# Patient Record
Sex: Female | Born: 1935 | Race: White | Hispanic: No | Smoking: Former smoker
Health system: Southern US, Community
[De-identification: ages and names within clinical notes are randomized; demographics above are authoritative.]

## PROBLEM LIST (undated history)

## (undated) DIAGNOSIS — J449 Chronic obstructive pulmonary disease, unspecified: Secondary | ICD-10-CM

## (undated) DIAGNOSIS — Z950 Presence of cardiac pacemaker: Secondary | ICD-10-CM

## (undated) DIAGNOSIS — F0280 Dementia in other diseases classified elsewhere without behavioral disturbance: Secondary | ICD-10-CM

## (undated) DIAGNOSIS — F419 Anxiety disorder, unspecified: Secondary | ICD-10-CM

## (undated) DIAGNOSIS — G309 Alzheimer's disease, unspecified: Secondary | ICD-10-CM

## (undated) DIAGNOSIS — F32A Depression, unspecified: Secondary | ICD-10-CM

## (undated) DIAGNOSIS — R55 Syncope and collapse: Secondary | ICD-10-CM

## (undated) DIAGNOSIS — M1990 Unspecified osteoarthritis, unspecified site: Secondary | ICD-10-CM

## (undated) DIAGNOSIS — F329 Major depressive disorder, single episode, unspecified: Secondary | ICD-10-CM

## (undated) DIAGNOSIS — I447 Left bundle-branch block, unspecified: Secondary | ICD-10-CM

## (undated) DIAGNOSIS — I10 Essential (primary) hypertension: Secondary | ICD-10-CM

## (undated) HISTORY — DX: Syncope and collapse: R55

## (undated) HISTORY — DX: Left bundle-branch block, unspecified: I44.7

## (undated) HISTORY — PX: SHOULDER SURGERY: SHX246

---

## 1968-07-19 HISTORY — PX: AUGMENTATION MAMMAPLASTY: SUR837

## 1998-01-09 ENCOUNTER — Ambulatory Visit (HOSPITAL_COMMUNITY): Admission: RE | Admit: 1998-01-09 | Discharge: 1998-01-09 | Payer: Self-pay | Admitting: Plastic Surgery

## 1999-08-20 ENCOUNTER — Emergency Department (HOSPITAL_COMMUNITY): Admission: EM | Admit: 1999-08-20 | Discharge: 1999-08-20 | Payer: Self-pay | Admitting: Emergency Medicine

## 1999-08-20 ENCOUNTER — Encounter: Payer: Self-pay | Admitting: Emergency Medicine

## 1999-08-23 ENCOUNTER — Inpatient Hospital Stay (HOSPITAL_COMMUNITY): Admission: EM | Admit: 1999-08-23 | Discharge: 1999-08-25 | Payer: Self-pay | Admitting: Emergency Medicine

## 1999-08-23 ENCOUNTER — Encounter: Payer: Self-pay | Admitting: Emergency Medicine

## 1999-08-31 ENCOUNTER — Inpatient Hospital Stay (HOSPITAL_COMMUNITY): Admission: RE | Admit: 1999-08-31 | Discharge: 1999-09-03 | Payer: Self-pay | Admitting: Orthopedic Surgery

## 1999-08-31 ENCOUNTER — Encounter: Payer: Self-pay | Admitting: Orthopedic Surgery

## 2000-07-29 ENCOUNTER — Other Ambulatory Visit: Admission: RE | Admit: 2000-07-29 | Discharge: 2000-07-29 | Payer: Self-pay | Admitting: Family Medicine

## 2000-08-03 ENCOUNTER — Encounter: Admission: RE | Admit: 2000-08-03 | Discharge: 2000-08-03 | Payer: Self-pay | Admitting: Family Medicine

## 2000-08-03 ENCOUNTER — Encounter: Payer: Self-pay | Admitting: Family Medicine

## 2001-10-03 ENCOUNTER — Ambulatory Visit (HOSPITAL_COMMUNITY): Admission: RE | Admit: 2001-10-03 | Discharge: 2001-10-03 | Payer: Self-pay | Admitting: Family Medicine

## 2001-10-03 ENCOUNTER — Encounter: Payer: Self-pay | Admitting: Family Medicine

## 2001-10-13 ENCOUNTER — Ambulatory Visit (HOSPITAL_COMMUNITY): Admission: RE | Admit: 2001-10-13 | Discharge: 2001-10-13 | Payer: Self-pay | Admitting: Family Medicine

## 2001-10-13 ENCOUNTER — Encounter (INDEPENDENT_AMBULATORY_CARE_PROVIDER_SITE_OTHER): Payer: Self-pay | Admitting: Specialist

## 2001-10-13 ENCOUNTER — Encounter: Payer: Self-pay | Admitting: Family Medicine

## 2001-12-03 ENCOUNTER — Encounter: Payer: Self-pay | Admitting: Emergency Medicine

## 2001-12-03 ENCOUNTER — Emergency Department (HOSPITAL_COMMUNITY): Admission: EM | Admit: 2001-12-03 | Discharge: 2001-12-03 | Payer: Self-pay | Admitting: Emergency Medicine

## 2002-03-20 ENCOUNTER — Emergency Department (HOSPITAL_COMMUNITY): Admission: EM | Admit: 2002-03-20 | Discharge: 2002-03-21 | Payer: Self-pay | Admitting: Emergency Medicine

## 2002-03-21 ENCOUNTER — Encounter: Payer: Self-pay | Admitting: Emergency Medicine

## 2002-07-05 ENCOUNTER — Ambulatory Visit (HOSPITAL_COMMUNITY): Admission: RE | Admit: 2002-07-05 | Discharge: 2002-07-05 | Payer: Self-pay | Admitting: Neurology

## 2002-09-24 ENCOUNTER — Ambulatory Visit (HOSPITAL_COMMUNITY): Admission: RE | Admit: 2002-09-24 | Discharge: 2002-09-24 | Payer: Self-pay | Admitting: Neurology

## 2002-10-03 ENCOUNTER — Encounter: Payer: Self-pay | Admitting: General Surgery

## 2002-10-08 ENCOUNTER — Observation Stay (HOSPITAL_COMMUNITY): Admission: RE | Admit: 2002-10-08 | Discharge: 2002-10-09 | Payer: Self-pay | Admitting: General Surgery

## 2002-12-18 ENCOUNTER — Other Ambulatory Visit: Admission: RE | Admit: 2002-12-18 | Discharge: 2002-12-18 | Payer: Self-pay | Admitting: Family Medicine

## 2003-05-09 ENCOUNTER — Emergency Department (HOSPITAL_COMMUNITY): Admission: EM | Admit: 2003-05-09 | Discharge: 2003-05-09 | Payer: Self-pay | Admitting: Emergency Medicine

## 2003-05-29 ENCOUNTER — Encounter: Admission: RE | Admit: 2003-05-29 | Discharge: 2003-05-29 | Payer: Self-pay | Admitting: Obstetrics & Gynecology

## 2003-06-06 ENCOUNTER — Encounter: Admission: RE | Admit: 2003-06-06 | Discharge: 2003-06-06 | Payer: Self-pay | Admitting: Pathology

## 2003-06-26 ENCOUNTER — Encounter: Admission: RE | Admit: 2003-06-26 | Discharge: 2003-06-26 | Payer: Self-pay | Admitting: Obstetrics & Gynecology

## 2003-06-29 ENCOUNTER — Emergency Department (HOSPITAL_COMMUNITY): Admission: AD | Admit: 2003-06-29 | Discharge: 2003-06-29 | Payer: Self-pay | Admitting: Family Medicine

## 2003-07-17 ENCOUNTER — Encounter: Admission: RE | Admit: 2003-07-17 | Discharge: 2003-07-17 | Payer: Self-pay | Admitting: Endocrinology

## 2003-08-06 ENCOUNTER — Ambulatory Visit (HOSPITAL_COMMUNITY): Admission: RE | Admit: 2003-08-06 | Discharge: 2003-08-06 | Payer: Self-pay | Admitting: *Deleted

## 2003-08-15 ENCOUNTER — Observation Stay (HOSPITAL_COMMUNITY): Admission: RE | Admit: 2003-08-15 | Discharge: 2003-08-16 | Payer: Self-pay | Admitting: Urology

## 2004-04-09 ENCOUNTER — Ambulatory Visit (HOSPITAL_COMMUNITY): Admission: RE | Admit: 2004-04-09 | Discharge: 2004-04-09 | Payer: Self-pay | Admitting: Urology

## 2004-06-03 ENCOUNTER — Ambulatory Visit: Payer: Self-pay | Admitting: Endocrinology

## 2004-06-18 ENCOUNTER — Ambulatory Visit (HOSPITAL_COMMUNITY): Admission: RE | Admit: 2004-06-18 | Discharge: 2004-06-18 | Payer: Self-pay | Admitting: Urology

## 2005-07-27 ENCOUNTER — Ambulatory Visit (HOSPITAL_COMMUNITY): Admission: RE | Admit: 2005-07-27 | Discharge: 2005-07-27 | Payer: Self-pay | Admitting: Pathology

## 2006-10-26 ENCOUNTER — Inpatient Hospital Stay (HOSPITAL_COMMUNITY): Admission: AD | Admit: 2006-10-26 | Discharge: 2006-10-26 | Payer: Self-pay | Admitting: Gynecology

## 2006-11-17 ENCOUNTER — Ambulatory Visit: Payer: Self-pay | Admitting: Vascular Surgery

## 2006-12-08 ENCOUNTER — Inpatient Hospital Stay (HOSPITAL_COMMUNITY): Admission: AD | Admit: 2006-12-08 | Discharge: 2006-12-08 | Payer: Self-pay | Admitting: Obstetrics and Gynecology

## 2006-12-08 ENCOUNTER — Encounter: Admission: RE | Admit: 2006-12-08 | Discharge: 2006-12-08 | Payer: Self-pay | Admitting: Pathology

## 2006-12-20 ENCOUNTER — Ambulatory Visit (HOSPITAL_COMMUNITY): Admission: RE | Admit: 2006-12-20 | Discharge: 2006-12-20 | Payer: Self-pay | Admitting: *Deleted

## 2007-08-11 ENCOUNTER — Encounter: Admission: RE | Admit: 2007-08-11 | Discharge: 2007-08-11 | Payer: Self-pay | Admitting: Internal Medicine

## 2007-08-21 ENCOUNTER — Encounter: Admission: RE | Admit: 2007-08-21 | Discharge: 2007-08-21 | Payer: Self-pay | Admitting: Internal Medicine

## 2007-09-04 ENCOUNTER — Encounter: Admission: RE | Admit: 2007-09-04 | Discharge: 2007-09-04 | Payer: Self-pay | Admitting: Radiology

## 2007-09-05 ENCOUNTER — Encounter: Admission: RE | Admit: 2007-09-05 | Discharge: 2007-09-05 | Payer: Self-pay | Admitting: Radiology

## 2007-09-25 ENCOUNTER — Encounter: Admission: RE | Admit: 2007-09-25 | Discharge: 2007-09-25 | Payer: Self-pay | Admitting: Radiology

## 2007-12-28 ENCOUNTER — Encounter: Admission: RE | Admit: 2007-12-28 | Discharge: 2007-12-28 | Payer: Self-pay

## 2008-07-02 ENCOUNTER — Encounter: Admission: RE | Admit: 2008-07-02 | Discharge: 2008-07-02 | Payer: Self-pay | Admitting: *Deleted

## 2010-08-09 ENCOUNTER — Encounter: Payer: Self-pay | Admitting: Specialist

## 2010-08-09 ENCOUNTER — Encounter: Payer: Self-pay | Admitting: Radiology

## 2010-12-04 NOTE — Op Note (Signed)
NAMEHAIDYN, KILBURG                       ACCOUNT NO.:  [id number]   MEDICAL RECORD NO.:  [id number]                   PATIENT TYPE:  OBV   LOCATION:  0447                                 FACILITY:  [facility]   PHYSICIAN:  Gita Kudo, M.D.              DATE OF BIRTH:  23-Sep-1923   DATE OF PROCEDURE:  10/08/2002  DATE OF DISCHARGE:                                 OPERATIVE REPORT   PROCEDURE:  Repair right inguinal hernia-direct and indirect, with Prolene  mesh preperitoneal and onlay.   SURGEON:  Gita Kudo, M.D.   ANESTHESIA:  General.   PREOPERATIVE DIAGNOSES:  Right inguinal hernia.   POSTOPERATIVE DIAGNOSES:  Right inguinal hernia-direct and indirect.   CLINICAL SUMMARY:  A 77 year old female had left inguinal hernia repaired  years ago and now has a bulge in her right groin consistent with a right  inguinal hernia.   FINDINGS:  The patient had a lax abdominal wall with a definite right  inguinal hernia both direct and indirect. The contents were empted at time  of surgery.   DESCRIPTION OF PROCEDURE:  Under satisfactory general anesthesia, having  received 1.0 gm Ancef preop, the patient's abdomen prepped and draped in a  standard fashion. 30 mL of 0.5% Marcaine was infiltrated during the  procedure. A transverse incision made and carried down to and through the  external ring and external oblique. Bleeders coagulated or tied with 3-0  Vicryl and good exposure obtained with self retaining retractors. The hernia  sac was identified, dissected high, twisted and controlled with #0 Prolene  suture and excess excised. The floor of the canal was then opened from the  pubis to the internal ring and standard dissection used to reduce the  preperitoneal contents and they were held away with a moistened gauze. One-  third of a 3 x 6 inch piece of Prolene mesh was tailored into an oval and  placed in the preperitoneal space and anchored at Cooper's ligament with a  #0 Prolene suture. It was then unfolded laterally and inferiorly. The gauze  was removed and the mesh was then unfolded superiorly and medially.  Following this, the floor of the canal was closed over the mesh taking  intermittent bites of the mesh with running #0 Prolene and when tied at the  internal ring, the ring was closed and the ends of the suture left long. The  remaining mesh was tailored into an oval and anchored to the repair of the  previous suture and then tacked around the periphery with interrupted #0  Prolene to the inguinal ligament, tissue near the pubis, internal  oblique. The wound lavaged with saline and closed in layers with running 2-0  Vicryl, interrupted 2-0 Vicryl for the superficial fascia, and 3-0 Vicryl  for subcu. Steri-Strips for skin. Sterile absorbent dressings applied and  the patient went to the recovery room from the operating room in good  condition without complications.                                                Gita Kudo, M.D.    MRL/MEDQ  D:  10/08/2002  T:  10/08/2002  Job:  [id number]

## 2010-12-04 NOTE — Op Note (Signed)
NAMEDAMARIA, [name]           ACCOUNT NO.:  [id number]   MEDICAL RECORD NO.:  [id number]          PATIENT TYPE:  AMB   LOCATION:  DAY                          FACILITY:  [facility]   PHYSICIAN:  Sigmund I. Patsi Sears, M.D.DATE OF BIRTH:  Apr 10, 1924   DATE OF PROCEDURE:  04/09/2004  DATE OF DISCHARGE:                                 OPERATIVE REPORT   PREOPERATIVE DIAGNOSES:  Eroded pubovaginal sling.   POSTOPERATIVE DIAGNOSES:  Eroded pubovaginal sling.   PROCEDURE:  Excision of extrusion of eroded pubovaginal sling.   SURGEON:  Sigmund I. Patsi Sears, M.D.   RESIDENT SURGEON:  Thyra Breed, MD   ANESTHESIA:  Laryngeal mask airway.   COMPLICATIONS:  None.   INDICATIONS FOR PROCEDURE:  Laura [name] is a pleasant 77 year old female  who has a long standing history of mixed urinary incontinence.  Specifically  she was having most trouble with her stress urinary incontinence which was  occurring with coughing, sneezing and laughing.  However in January of 2005,  Dr. Patsi Sears placed a transobturator suburethral sling which greatly  improved her symptomatology in fact improving her urge component as well.  Laura [name] does use an Estring ring.  Recently on a followup visit, [name]'s suburethral sling was noted to be eroded through the anterior  vaginal mucosa.  Therefore we have explained the need to remove this portion  of the sling under anesthesia. She understands all the risks, benefits, and  alternatives of the procedure and is willing to proceed.   Following identification by her arm bracelet, the patient was brought to the  operating room and placed in the supine position.  Here she received  preoperative IV antibiotics and underwent successful induction of laryngeal  mask airway anesthesia. She was then moved to the dorsal lithotomy position.  Her perineum and genitalia were then prepped with Betadine and draped in the  usual sterile fashion. We initially placed a  weighted vaginal speculum into  the vaginal vault. With Trendelenburg, this allowed excellent exposure of  the extruded sling material in the suburethral position.  Using pickups to  stretch the sling material, we then used scissors to cut back as far as  possible to remove the sling material. There appeared to be nice granulation  since the placement of the sling material. We cut the opposite side of the  sling material and removed all sling material from visibility.  At this  time, we felt no further evidence of sling material; however, there was a  small opening in the anterior vaginal mucosa on the right. Therefore 2-0  Vicryl was used in a figure-of-eight fashion to close this area.  The vagina  was then irrigated with antibiotic solution. There was no evidence of  bleeding and no further evidence of sling material. The patient tolerated  the procedure well and there were no complications.  Please note that  Sigmund I. Patsi Sears, M.D. was present and participated in the entire  procedure.   DISPOSITION:  After awaking from LMA anesthesia, the patient was transported  to the post anesthesia care unit in stable condition.  From here, she will  be discharged to home. A followup appointment has been scheduled with Dr.  Patsi Sears. The patient is asked to call [phone] to confirm the  appointment.      EG/MEDQ  D:  04/10/2004  T:  04/11/2004  Job:  [id number]

## 2010-12-04 NOTE — Group Therapy Note (Signed)
Laura [name], Laura [name]                       ACCOUNT NO.:  [id number]   MEDICAL RECORD NO.:  [id number]                   PATIENT TYPE:  OUT   LOCATION:  [facility]                           FACILITY:  [facility]   PHYSICIAN:  Elsie Lincoln, MD                   DATE OF BIRTH:  1924-03-13   DATE OF SERVICE:  05/29/2003                                    CLINIC NOTE   HISTORY OF PRESENT ILLNESS:  The patient is a 77 year old para 2-0-0-2  menopausal female with complaints of severe vaginal burning for greater than  a month.  The patient has been menopausal for the past 30 years and has  never experienced any postmenopausal bleeding.  She was diagnosed in 2003  with disruption of the antero-external anal sphincter at [facility],  but was not given surgery secondary to surgeon's recommendations.  We are  unclear why this was not done.  However, patient was experiencing multiple  other medical problems at that time that perhaps he thought were more  serious than the fecal incontinence.  She was not diagnosed with a fistula.  The patient also complains of recurrent urinary tract infections and urinary  incontinence consistent with a mixed picture.  She has urge incontinence as  well as stress incontinence.   PAST MEDICAL HISTORY:  1. Osteoporosis with stress fractures.  2. 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  The patient unable     to clarify although not on drugs at this time.  3. Anxiety.  4. Depression.   PAST SURGICAL HISTORY:  1. She had bilateral hernia repairs.  2. Bilateral breast implants.  3. Left arm surgery.  4. Tonsillectomy.  5. Vein ligation.   GYNECOLOGIC HISTORY:  Denies any fibroids, cysts, history of transmitted  diseases, or Pap smears that are not normal.  She is not sexually active at  this time.  She did have a CT of her chest and abdomen at some point at  [facility] and there was one calcified fibroid on this.  The patient  is  asymptomatic from this.   PHYSICAL EXAMINATION:  VITAL SIGNS:  Blood pressure 123/83, pulse 90, weight  157, temperature 98.2.  BREAST:  Implants.  Hard, irregular, painful on the left.  Unable to palpate  tissue behind implant.  No redness or ecchymosis.  ABDOMEN:  Soft, nontender, nondistended.  Right hernia incision scar well  healed.  No CVA tenderness.  PELVIC:  External genitalia Tanner 5.  Vagina extremely atrophic, no  bleeding, small amount of clear discharge in the vagina.  No odor.  Cervix  atrophic, almost flush with the vagina, nontender.  Uterus nontender.  Adnexa nontender.  Rectovaginal:  No masses or defects felt.   ASSESSMENT/PLAN:  A 77 year old menopausal female with vaginal burning  consistent with atrophic vaginitis.  1. Vagifem q.h.s. for five nights, then two times a week.  2. Wet prep sent.  3. Urinary tract infection.  Urinalysis, urine culture sent.  Cipro for     seven days was given 500 b.i.d.  4. Referral to breast surgeon to evaluate painful breast and abnormal     implant.  5. Referral to surgeon to address fecal incontinence and disruption of the     anal sphincter.  6. Referral to internist to help manage multiple medical problems.  7. Return to clinic in one month to see how patient is doing.                                               Elsie Lincoln, MD    KL/MEDQ  D:  05/29/2003  T:  05/29/2003  Job:  [id number]

## 2010-12-04 NOTE — Op Note (Signed)
Laura [name], STRNAD                       ACCOUNT NO.:  [id number]   MEDICAL RECORD NO.:  [id number]                   PATIENT TYPE:  AMB   LOCATION:  DAY                                  FACILITY:  [facility]   PHYSICIAN:  Sigmund I. Patsi Sears, M.D.         DATE OF BIRTH:  10-Oct-1923   DATE OF PROCEDURE:  08/15/2003  DATE OF DISCHARGE:                                 OPERATIVE REPORT   PREOPERATIVE DIAGNOSIS:  Stress urinary incontinence.   POSTOPERATIVE DIAGNOSIS:  Stress urinary incontinence.   PROCEDURE:  Cystoscopy, placement of transobturator suburethral sling.   SURGEON:  Sigmund I. Patsi Sears, M.D.   ASSISTANT:  Susanne Borders, MD   ANESTHESIA:  Laryngeal mask airway.   COMPLICATIONS:  None.   ESTIMATED BLOOD LOSS:  Minimal.   DISPOSITION:  To post anesthesia care unit in stable condition.   DRAINS:  56 French Foley catheter to straight drain.   INDICATIONS FOR PROCEDURE:  Ms. Reagor is a 77 year old with a  longstanding history of mixed urinary incontinence.  She gives a history of  having a significant stress component to her incontinence which occurs with  coughing, sneezing, laughing, etc.  Dr. Patsi Sears has offered her placement  of a suburethral sling.  She has consented to this after understanding the  risks, benefits, and alternatives.   DESCRIPTION OF PROCEDURE:  The patient was brought to the operating room and  correctly identified by his identification bracelet. She was given  preoperative antibiotics and general endotracheal anesthesia  She was placed  in the dorsal lithotomy position and shaved prepped and draped in the  typical sterile fashion.  A 16 French Foley catheter was placed within the  urethra, the bladder was drained, the Foley catheter was clamped.  A  weighted speculum was placed in the posterior vagina.  An approximately 2 cm  incision was made in the area of the mid urethra.  The plane between the  anterior vaginal wall mucosa and  the posterior urethra was dissected with  scissors laterally around the urethra such that the tip of the surgeon's  finger could be placed in this space.  Stab incisions were made bilaterally  5 cm lateral to the clitoris.  The transobturator trocar was then carefully  passed through the obturator foramen through the endopelvic fascia and out  through the vaginal incision using the tip of the surgeon's finger to guide  the trocar.  The end of the sling was connected to the tip of the trocar and  the sling was pulled through the stab incision. This procedure was then  performed on the right side as well.  On both sides, the trocar passed quite  easily into the right space. Care was taken not to back __________ the  vaginal mucosa while passing the trocar.  Cystoscopy was then carried out to  ensure that the trocar and sling had not passed into the bladder mucosa.  There was no evidence of this  on cystoscopy. Furthermore there were no  mucosal abnormalities, tumors, foreign bodies or stones. Bilateral ureteral  orifices were identified in their normal anatomic location.  A right angle  was placed beneath the sling and the laxity of the sling was pulled on both  sides such that the midline of the sling was in the middle of the urethra.  The tension was such that a right angle could easily be placed between the  posterior urethra and the sling material.  The excess sling material was  then trimmed at the level of the stab incisions bilaterally.  The vaginal  incision was closed with a running 2-0 Vicryl suture.  Dermabond was placed  in the labial stab incision. The Foley catheter was replaced in the urethra  and connected to straight drain.  The patient was then awakened from  anesthesia without complications and taken to post anesthesia care unit in  stable condition.  Please note that Dr. Patsi Sears was present and  participated in all aspects of this case as he was the primary  surgeon.     Susanne Borders, MD                           Sigmund I. Patsi Sears, M.D.    DR/MEDQ  D:  08/15/2003  T:  08/15/2003  Job:  [id number]

## 2010-12-04 NOTE — Op Note (Signed)
Laura [name], Laura [name]           ACCOUNT NO.:  [id number]   MEDICAL RECORD NO.:  [id number]          PATIENT TYPE:  AMB   LOCATION:  DAY                          FACILITY:  [facility]   PHYSICIAN:  Sigmund I. Patsi Sears, M.D.DATE OF BIRTH:  14-Dec-1923   DATE OF PROCEDURE:  06/18/2004  DATE OF DISCHARGE:                                 OPERATIVE REPORT   PREOPERATIVE DIAGNOSIS:  History of extrusion of pubovaginal sling.   POSTOPERATIVE DIAGNOSIS:  History of extrusion of pubovaginal sling.   OPERATION:  Vaginal debridement and secondary closure of extruded  pubovaginal sling.   SURGEON:  Sigmund I. Patsi Sears, M.D.   ANESTHESIA:  General LMA.   PREPARATION:  After appropriate preanesthesia, the patient was brought to  the operating room and placed on the operating table in dorsal supine  position where general LMA anesthesia was introduced. She was replaced in  the dorsal lithotomy position where the pubis was prepped with Betadine  solution and draped in the usual fashion.   PROCEDURE:  Vaginal inspection revealed an area of extrusion in the right  lateral periurethral space. A circumferential incision was made, and the  tissue removed. The sling was grasped and cut with Mayo scissors. Wound was  copiously irrigated with antibiotic irrigation and debrided. Wound was then  closed secondarily with 3-0 Vicryl suture. The left side of the wound was  inspected, and there was a noted area of scar. There was no extrusion. It  was elected to excise the vaginal scar, and this was accomplished with a  small elliptical incision, and reclosure with interrupted 3-0 Vicryl  sutures. The vaginal Estrogen ring was replaced, the patient was given 15 mg  of IV Toradol, as well as IV antibiotic. She was awakened and taken to the  recovery room in good condition.     Sigm   SIT/MEDQ  D:  06/18/2004  T:  06/18/2004  Job:  [id number]

## 2012-06-12 ENCOUNTER — Encounter (HOSPITAL_COMMUNITY): Payer: Self-pay | Admitting: Pharmacy Technician

## 2012-06-16 ENCOUNTER — Ambulatory Visit
Admission: RE | Admit: 2012-06-16 | Discharge: 2012-06-16 | Disposition: A | Discharge status: Home / Self Care | Payer: Medicare Other | Source: Ambulatory Visit | Attending: Cardiovascular Disease | Admitting: Cardiovascular Disease

## 2012-06-16 ENCOUNTER — Other Ambulatory Visit: Payer: Self-pay | Admitting: Cardiovascular Disease

## 2012-06-16 DIAGNOSIS — Z01818 Encounter for other preprocedural examination: Secondary | ICD-10-CM

## 2012-06-19 ENCOUNTER — Other Ambulatory Visit: Payer: Self-pay | Admitting: Cardiovascular Disease

## 2012-06-21 MED ORDER — SODIUM CHLORIDE 0.9 % IR SOLN
80.0000 mg | Status: DC
  Filled 2012-06-21: qty 2

## 2012-06-21 MED ORDER — SODIUM CHLORIDE 0.45 % IV SOLN
INTRAVENOUS | Status: DC
  Administered 2012-06-22: 07:00:00 via INTRAVENOUS

## 2012-06-21 MED ORDER — CEFAZOLIN SODIUM-DEXTROSE 2-3 GM-% IV SOLR
2.0000 g | INTRAVENOUS | Status: DC
  Filled 2012-06-21: qty 50

## 2012-06-21 MED ORDER — SODIUM CHLORIDE 0.9 % IJ SOLN: 3.0000 mL | INTRAMUSCULAR | Status: DC | PRN

## 2012-06-22 ENCOUNTER — Encounter (HOSPITAL_COMMUNITY)
Admission: RE | Disposition: A | Discharge status: Home / Self Care | Payer: Self-pay | Source: Ambulatory Visit | Attending: Cardiovascular Disease

## 2012-06-22 ENCOUNTER — Ambulatory Visit (HOSPITAL_COMMUNITY)
Admission: RE | Admit: 2012-06-22 | Discharge: 2012-06-22 | Disposition: A | Discharge status: Home / Self Care | Payer: Medicare Other | Source: Ambulatory Visit | Attending: Cardiovascular Disease | Admitting: Cardiovascular Disease

## 2012-06-22 DIAGNOSIS — R55 Syncope and collapse: Secondary | ICD-10-CM | POA: Insufficient documentation

## 2012-06-22 HISTORY — PX: LOOP RECORDER IMPLANT: SHX5477

## 2012-06-22 LAB — SURGICAL PCR SCREEN
MRSA, PCR: NEGATIVE
Staphylococcus aureus: NEGATIVE

## 2012-06-22 SURGERY — LOOP RECORDER IMPLANT
Anesthesia: LOCAL

## 2012-06-22 MED ORDER — ONDANSETRON HCL 4 MG/2ML IJ SOLN: 4.0000 mg | Freq: Four times a day (QID) | INTRAMUSCULAR | Status: DC | PRN

## 2012-06-22 MED ORDER — FENTANYL CITRATE 0.05 MG/ML IJ SOLN
INTRAMUSCULAR | Status: AC
  Filled 2012-06-22: qty 2

## 2012-06-22 MED ORDER — MUPIROCIN 2 % EX OINT
TOPICAL_OINTMENT | Freq: Two times a day (BID) | CUTANEOUS | Status: DC
  Filled 2012-06-22: qty 22

## 2012-06-22 MED ORDER — CEFAZOLIN SODIUM-DEXTROSE 2-3 GM-% IV SOLR
INTRAVENOUS | Status: AC
  Filled 2012-06-22: qty 50

## 2012-06-22 MED ORDER — MUPIROCIN 2 % EX OINT
TOPICAL_OINTMENT | CUTANEOUS | Status: AC
  Administered 2012-06-22: 1
  Filled 2012-06-22: qty 22

## 2012-06-22 MED ORDER — ACETAMINOPHEN 325 MG PO TABS: 325.0000 mg | ORAL_TABLET | ORAL | Status: DC | PRN

## 2012-06-22 MED ORDER — SODIUM CHLORIDE 0.9 % IV SOLN: INTRAVENOUS | Status: DC

## 2012-06-22 MED ORDER — CHLORHEXIDINE GLUCONATE 4 % EX LIQD
60.0000 mL | Freq: Once | CUTANEOUS | Status: DC
  Filled 2012-06-22: qty 60

## 2012-06-22 MED ORDER — HEPARIN (PORCINE) IN NACL 2-0.9 UNIT/ML-% IJ SOLN
INTRAMUSCULAR | Status: AC
  Filled 2012-06-22: qty 500

## 2012-06-22 MED ORDER — LIDOCAINE HCL (PF) 1 % IJ SOLN
INTRAMUSCULAR | Status: AC
  Filled 2012-06-22: qty 60

## 2012-06-22 MED ORDER — MIDAZOLAM HCL 2 MG/2ML IJ SOLN
INTRAMUSCULAR | Status: AC
  Filled 2012-06-22: qty 2

## 2012-06-22 NOTE — H&P (Signed)
Date of Initial H&P: 06/06/2012  History reviewed, patient examined, no change in status, stable for surgery. Unexplained syncope with potential brady or tachyarrhythmia as etiology in view of structural cardiac disease. Here for implantable loop recorder. This procedure has been fully reviewed with the patient and written informed consent has been obtained. Thurmon Fair, MD, [facility] [phone] office [phone] pager

## 2012-06-22 NOTE — CV Procedure (Signed)
LOOP RECORDER IMPLANT [[id number]] LOOP RECORDER IMPLANT [[id number]]   Procedure report  Procedure performed:  1. Loop recorder implantation  2. Light sedation  Reason for procedure:  1. Syncope Procedure performed by:  Thurmon Fair, MD  Complications:  None  Estimated blood loss:  <5 mL  Medications administered during procedure:  Ancef 2 g intravenously, lidocaine 1% 30 mL locally, fentanyl 25 mcg intravenously, Versed 1 mg intravenously  Device details:  Medtronic Reveal XT [id number], serial number [id number]  Procedure details:  After the risks and benefits of the procedure were discussed the patient provided informed consent. She was brought to the cardiac catheter lab in the fasting state. The patient was prepped and draped in usual sterile fashion. the best location for Loop recording had been established by preprocedure mapping to be in the left 5th intercostal space immediately parasternal. Local anesthesia with 1% lidocaine was administered to to the left infraclavicular area. A 3 cm horizontal incision was made and dislocation. Usingelectrocautery and mostly sharp and blunt dissection a pocket was created with careful attention to hemostasis. The pocket was flushed with copious amounts of antibiotic solution.  The device was then carefully inserted in the pocket with care so that there would not be pressure on the incision. The pocket was then closed in layers using 2 layers of 2-0 Vicryl and cutaneous staples after which a sterile dressing was applied.  Thurmon Fair, MD, [facility] [phone] office [phone] pager 06/22/2012 10:31 AM  Cc:Michiel Cowboy, MD

## 2012-06-29 ENCOUNTER — Other Ambulatory Visit (HOSPITAL_COMMUNITY): Payer: Self-pay | Admitting: Internal Medicine

## 2012-06-29 DIAGNOSIS — R55 Syncope and collapse: Secondary | ICD-10-CM

## 2012-07-07 ENCOUNTER — Ambulatory Visit (HOSPITAL_COMMUNITY): Payer: Medicare Other

## 2012-07-07 ENCOUNTER — Encounter (HOSPITAL_COMMUNITY): Payer: Medicare Other

## 2013-01-17 ENCOUNTER — Emergency Department (HOSPITAL_COMMUNITY)
Admission: EM | Admit: 2013-01-17 | Discharge: 2013-01-17 | Disposition: A | Discharge status: Home / Self Care | Payer: Medicare Other | Attending: Emergency Medicine | Admitting: Emergency Medicine

## 2013-01-17 ENCOUNTER — Encounter (HOSPITAL_COMMUNITY): Payer: Self-pay | Admitting: Emergency Medicine

## 2013-01-17 ENCOUNTER — Emergency Department (HOSPITAL_COMMUNITY): Payer: Medicare Other

## 2013-01-17 DIAGNOSIS — F3289 Other specified depressive episodes: Secondary | ICD-10-CM | POA: Insufficient documentation

## 2013-01-17 DIAGNOSIS — R0789 Other chest pain: Secondary | ICD-10-CM

## 2013-01-17 DIAGNOSIS — I10 Essential (primary) hypertension: Secondary | ICD-10-CM | POA: Insufficient documentation

## 2013-01-17 DIAGNOSIS — Z79899 Other long term (current) drug therapy: Secondary | ICD-10-CM | POA: Insufficient documentation

## 2013-01-17 DIAGNOSIS — F329 Major depressive disorder, single episode, unspecified: Secondary | ICD-10-CM | POA: Insufficient documentation

## 2013-01-17 DIAGNOSIS — Z9889 Other specified postprocedural states: Secondary | ICD-10-CM | POA: Insufficient documentation

## 2013-01-17 DIAGNOSIS — M25519 Pain in unspecified shoulder: Secondary | ICD-10-CM | POA: Insufficient documentation

## 2013-01-17 DIAGNOSIS — F411 Generalized anxiety disorder: Secondary | ICD-10-CM | POA: Insufficient documentation

## 2013-01-17 DIAGNOSIS — Z87891 Personal history of nicotine dependence: Secondary | ICD-10-CM | POA: Insufficient documentation

## 2013-01-17 DIAGNOSIS — G309 Alzheimer's disease, unspecified: Secondary | ICD-10-CM | POA: Insufficient documentation

## 2013-01-17 DIAGNOSIS — R071 Chest pain on breathing: Secondary | ICD-10-CM | POA: Insufficient documentation

## 2013-01-17 DIAGNOSIS — Z8739 Personal history of other diseases of the musculoskeletal system and connective tissue: Secondary | ICD-10-CM | POA: Insufficient documentation

## 2013-01-17 DIAGNOSIS — F0280 Dementia in other diseases classified elsewhere without behavioral disturbance: Secondary | ICD-10-CM | POA: Insufficient documentation

## 2013-01-17 DIAGNOSIS — R0602 Shortness of breath: Secondary | ICD-10-CM | POA: Insufficient documentation

## 2013-01-17 HISTORY — DX: Depression, unspecified: F32.A

## 2013-01-17 HISTORY — DX: Dementia in other diseases classified elsewhere, unspecified severity, without behavioral disturbance, psychotic disturbance, mood disturbance, and anxiety: F02.80

## 2013-01-17 HISTORY — DX: Alzheimer's disease, unspecified: G30.9

## 2013-01-17 HISTORY — DX: Major depressive disorder, single episode, unspecified: F32.9

## 2013-01-17 HISTORY — DX: Essential (primary) hypertension: I10

## 2013-01-17 HISTORY — DX: Unspecified osteoarthritis, unspecified site: M19.90

## 2013-01-17 HISTORY — DX: Anxiety disorder, unspecified: F41.9

## 2013-01-17 LAB — CBC WITH DIFFERENTIAL/PLATELET
Basophils Absolute: 0 10*3/uL (ref 0.0–0.1)
Lymphocytes Relative: 17 % (ref 12–46)
Neutro Abs: 6.4 10*3/uL (ref 1.7–7.7)
Neutrophils Relative %: 75 % (ref 43–77)
Platelets: 238 10*3/uL (ref 150–400)
RDW: 13.6 % (ref 11.5–15.5)
WBC: 8.6 10*3/uL (ref 4.0–10.5)

## 2013-01-17 LAB — COMPREHENSIVE METABOLIC PANEL
ALT: 7 U/L (ref 0–35)
AST: 13 U/L (ref 0–37)
Albumin: 3.7 g/dL (ref 3.5–5.2)
CO2: 27 mEq/L (ref 19–32)
Calcium: 9.2 mg/dL (ref 8.4–10.5)
Chloride: 103 mEq/L (ref 96–112)
GFR calc non Af Amer: 45 mL/min — ABNORMAL LOW (ref >90)
Sodium: 139 mEq/L (ref 135–145)
Total Bilirubin: 0.4 mg/dL (ref 0.3–1.2)

## 2013-01-17 LAB — POCT I-STAT TROPONIN I

## 2013-01-17 MED ORDER — IOHEXOL 350 MG/ML SOLN
100.0000 mL | Freq: Once | INTRAVENOUS | Status: AC | PRN
  Administered 2013-01-17: 100 mL via INTRAVENOUS

## 2013-01-17 MED ORDER — ARTIFICIAL TEARS OP OINT
TOPICAL_OINTMENT | Freq: Once | OPHTHALMIC | Status: AC
  Administered 2013-01-17: 15:00:00 via OPHTHALMIC
  Filled 2013-01-17: qty 3.5

## 2013-01-17 MED ORDER — MORPHINE SULFATE 4 MG/ML IJ SOLN
4.0000 mg | Freq: Once | INTRAMUSCULAR | Status: AC
  Administered 2013-01-17: 4 mg via INTRAVENOUS
  Filled 2013-01-17: qty 1

## 2013-01-17 MED ORDER — ONDANSETRON HCL 4 MG/2ML IJ SOLN
4.0000 mg | Freq: Once | INTRAMUSCULAR | Status: AC
  Administered 2013-01-17: 4 mg via INTRAVENOUS
  Filled 2013-01-17: qty 2

## 2013-01-17 NOTE — Discharge Instructions (Signed)

## 2013-01-17 NOTE — ED Provider Notes (Addendum)
History    CSN: [id number] Arrival date & time 01/17/13  1020  First MD Initiated Contact with Patient 01/17/13 1022     Chief Complaint  Patient presents with  . Chest Pain  . Shoulder Pain   (Consider location/radiation/quality/duration/timing/severity/associated sxs/prior Treatment) Patient is a 79 y.o. female presenting with chest pain and shoulder pain. The history is provided by the patient.  Chest Pain Pain location:  L chest Pain quality: shooting and stabbing   Pain radiates to:  Does not radiate Pain radiates to the back: no   Pain severity:  Moderate Onset quality:  Sudden Duration:  24 hours Timing:  Constant Progression:  Worsening Chronicity:  New Context: breathing   Context comment:  Noticed pain with breathing before bed last night htat has worsened. Relieved by:  Nothing Worsened by:  Coughing and deep breathing Ineffective treatments:  None tried Associated symptoms: shortness of breath   Associated symptoms: no abdominal pain, no cough, no diaphoresis, no nausea, no palpitations, not vomiting and no weakness   Risk factors: hypertension   Risk factors: no coronary artery disease, no immobilization, no prior DVT/PE and no surgery   Shoulder Pain Associated symptoms include chest pain and shortness of breath. Pertinent negatives include no abdominal pain.   Past Medical History  Diagnosis Date  . Hypertension   . Arthritis   . Alzheimer's dementia   . Anxiety   . Depression    Past Surgical History  Procedure Laterality Date  . Shoulder surgery      Left    No family history on file. History  Substance Use Topics  . Smoking status: Former Games developer  . Smokeless tobacco: Not on file  . Alcohol Use: No   OB History   Grav Para Term Preterm Abortions TAB SAB Ect Mult Living                 Review of Systems  Constitutional: Negative for diaphoresis.  Respiratory: Positive for shortness of breath. Negative for cough.   Cardiovascular:  Positive for chest pain. Negative for palpitations.  Gastrointestinal: Negative for nausea, vomiting and abdominal pain.  Neurological: Negative for weakness.  All other systems reviewed and are negative.    Allergies  Codeine  Home Medications   Current Outpatient Rx  Name  Route  Sig  Dispense  Refill  . traMADol (ULTRAM) 50 MG tablet   Oral   Take 100 mg by mouth 4 (four) times daily.         Marland Kitchen zolpidem (AMBIEN) 10 MG tablet   Oral   Take 10 mg by mouth at bedtime as needed. For sleep          BP 123/76  Pulse 78  Temp(Src) 98 F (36.7 C) (Oral)  Resp 13  Ht 5\' 4"  (1.626 m)  Wt 141 lb (63.957 kg)  BMI 24.19 kg/m2  SpO2 96% Physical Exam  Nursing note and vitals reviewed. Constitutional: She is oriented to person, place, and time. She appears well-developed and well-nourished. No distress.  HENT:  Head: Normocephalic and atraumatic.  Mouth/Throat: Oropharynx is clear and moist.  Eyes: Conjunctivae and EOM are normal. Pupils are equal, round, and reactive to light.  Neck: Normal range of motion. Neck supple.  Cardiovascular: Normal rate, regular rhythm and intact distal pulses.   No murmur heard. Pulmonary/Chest: Effort normal. No respiratory distress. She has no wheezes. She has rales in the left lower field. She exhibits tenderness.  Pacemaker in the left chest  and tenderness with palpaiton around the pacemaker.  Abdominal: Soft. She exhibits no distension. There is no tenderness. There is no rebound and no guarding.  Musculoskeletal: Normal range of motion. She exhibits no edema and no tenderness.  Neurological: She is alert and oriented to person, place, and time.  Skin: Skin is warm and dry. No rash noted. No erythema.  Psychiatric: She has a normal mood and affect. Her behavior is normal.    ED Course  Procedures (including critical care time) Labs Reviewed  CBC WITH DIFFERENTIAL - Abnormal; Notable for the following:    RBC 5.30 (*)    Hemoglobin  15.4 (*)    HCT 47.5 (*)    All other components within normal limits  COMPREHENSIVE METABOLIC PANEL - Abnormal; Notable for the following:    GFR calc non Af Amer 45 (*)    GFR calc Af Amer 52 (*)    All other components within normal limits  D-DIMER, QUANTITATIVE - Abnormal; Notable for the following:    D-Dimer, Quant 0.71 (*)    All other components within normal limits  POCT I-STAT TROPONIN I   Dg Chest 2 View  01/17/2013   *RADIOLOGY REPORT*  Clinical Data: Left-sided chest and shoulder pain  CHEST - 2 VIEW  Comparison: Chest x-ray of 06/16/2012  Findings: No active infiltrate or effusion is seen.  The heart is mildly enlarged.  The bones are diffusely osteopenic and multiple vertebroplasties are noted in the mid and lower thoracic spine.  A left shoulder prosthesis is present.  IMPRESSION: No active lung disease.  Diffuse osteopenia.   Original Report Authenticated By: Dwyane Dee, M.D.   Ct Angio Chest Pe W/cm &/or Wo Cm  01/17/2013   *RADIOLOGY REPORT*  Clinical Data: Rule out pulmonary embolus  CT ANGIOGRAPHY CHEST  Technique:  Multidetector CT imaging of the chest using the standard protocol during bolus administration of intravenous contrast. Multiplanar reconstructed images including MIPs were obtained and reviewed to evaluate the vascular anatomy.  Contrast: OMNIPAQUE IOHEXOL 350 MG/ML SOLN  Comparison: 07/02/2008  Findings: No pleural effusion.  Mild, bilateral posterior lower lobe subpleural consolidation noted.  Moderate changes of centrilobular emphysema noted.  Right upper lobe pulmonary nodule is unchanged from previous exam measuring 7 x 5 mm, image 50/series 6. Calcified granuloma identified within the right upper lobe.  The heart size is mildly enlarged.  No pericardial effusion. Calcification within the LAD coronary artery noted.  The pulmonary artery measures 4.30 cm in diameter.  No central pulmonary artery filling defect identified. No lobar or segmental pulmonary artery  filling defects identified.  There is a large nodule arising from the inferior pole of right lobe of thyroid gland which extends into the superior mediastinum.  Limited imaging through the upper abdomen shows no acute findings. Bilateral calcified breast implants noted.  The bones are osteopenic.  Compression deformities within the thoracic spine are again noted.  This is stable from previous exam.  Several these have been treated with bone cement.  IMPRESSION:  1.  No evidence for acute pulmonary embolus. 2.  Increased transverse diameter of the main pulmonary artery compatible with PA hypertension. 3.  Emphysema. 4.  Stable right lung nodule. 5.  Prior granulomatous disease. 3.  Large nodule with substernal extension arises from the inferior pole of the right lobe of thyroid gland.   Original Report Authenticated By: Signa Kell, M.D.    Date: 01/17/2013  Rate: 79  Rhythm: normal sinus rhythm  QRS Axis:  normal  Intervals: normal  ST/T Wave abnormalities: normal  Conduction Disutrbances:none  Narrative Interpretation:   Old EKG Reviewed: none available   1. Chest wall pain     MDM   Patient presenting with pleuritic chest pain that started yesterday. She is also complaining of shortness of breath. Symptoms are not suggestive of cardiac etiology however she does have a history of tachybradycardia syndrome with pacemaker placed 6 months ago. Patient is well appearing on exam with normal vital signs chest tenderness around the pacemaker with LLL rales but no other acute findings. Concern for PE versus cardiac etiology versus pleural effusion. CBC, CMP, d-dimer, chest x-ray pending. EKG within normal limits.  3:13 PM Labs normal except for elevated dimer.  CT chest neg for PE.  CXR wnl.  Will d/c pt with chest wall pain.  Gwyneth Sprout, MD 01/17/13 1514  Gwyneth Sprout, MD 01/17/13 1515

## 2013-01-17 NOTE — ED Notes (Signed)
Per EMS, patient is a resident of [address].   She started having L chest pain 24 hours ago.  Patient was tender on palpation on L chest and L shoulder.  Patient has chronic L shoulder pain.   Patient states she is an Alzheimers patient, but is A&O x 4.

## 2013-01-17 NOTE — ED Notes (Signed)
Patient given a meal bag. [facility] called.

## 2013-03-28 ENCOUNTER — Encounter: Payer: Self-pay | Admitting: *Deleted

## 2013-08-22 ENCOUNTER — Encounter: Payer: Self-pay | Admitting: *Deleted

## 2013-12-14 ENCOUNTER — Ambulatory Visit (INDEPENDENT_AMBULATORY_CARE_PROVIDER_SITE_OTHER): Payer: Medicare Other | Admitting: *Deleted

## 2013-12-14 DIAGNOSIS — R55 Syncope and collapse: Secondary | ICD-10-CM

## 2013-12-14 LAB — PACEMAKER DEVICE OBSERVATION

## 2013-12-14 LAB — MDC_IDC_ENUM_SESS_TYPE_INCLINIC: Implantable Pulse Generator Model: 9529

## 2013-12-14 NOTE — Progress Notes (Signed)
Loop check in clinic.  Pt with 0 tachy episodes; 0 brady episodes; 1 asystole x 4 sec---undersensing. 58 AF episodes (<0.1%)---max dur. 10 mins, Max V 261, Max Avg V 146---SVT/undersensing/oversensing. Plan to follow up with [facility] on 7-31.

## 2013-12-25 ENCOUNTER — Encounter: Payer: Self-pay | Admitting: Cardiovascular Disease

## 2014-01-03 ENCOUNTER — Telehealth: Payer: Self-pay | Admitting: Cardiovascular Disease

## 2014-01-03 ENCOUNTER — Encounter: Payer: Self-pay | Admitting: Cardiovascular Disease

## 2014-01-04 NOTE — Telephone Encounter (Signed)
Closed encounter °

## 2014-02-15 ENCOUNTER — Encounter: Payer: Medicare Other | Admitting: Cardiovascular Disease

## 2014-03-12 ENCOUNTER — Ambulatory Visit (INDEPENDENT_AMBULATORY_CARE_PROVIDER_SITE_OTHER): Payer: Medicare Other | Admitting: Cardiovascular Disease

## 2014-03-12 ENCOUNTER — Encounter: Payer: Self-pay | Admitting: Cardiovascular Disease

## 2014-03-12 VITALS — BP 110/72 | HR 95 | Resp 16 | Ht 64.0 in | Wt 160.7 lb

## 2014-03-12 DIAGNOSIS — R55 Syncope and collapse: Secondary | ICD-10-CM

## 2014-03-12 DIAGNOSIS — I2510 Atherosclerotic heart disease of native coronary artery without angina pectoris: Secondary | ICD-10-CM

## 2014-03-12 DIAGNOSIS — I447 Left bundle-branch block, unspecified: Secondary | ICD-10-CM

## 2014-03-12 LAB — MDC_IDC_ENUM_SESS_TYPE_INCLINIC

## 2014-03-12 NOTE — Patient Instructions (Addendum)
Remote monitoring is used to monitor your loop recorder from home. This monitoring reduces the number of office visits required to check your device to one time per year. It allows [place] to keep an eye on the functioning of your device to ensure it is working properly. You are scheduled for a device check from home on 06-17-2014. You may send your transmission at any time that day. If you have a wireless device, the transmission will be sent automatically. After your physician reviews your transmission, you will receive a postcard with your next transmission date.  Your physician recommends that you schedule a follow-up appointment in: 12 months with Dr.Croitoru   Continue with current medications

## 2014-03-13 ENCOUNTER — Encounter: Payer: Self-pay | Admitting: Cardiovascular Disease

## 2014-03-13 DIAGNOSIS — R55 Syncope and collapse: Secondary | ICD-10-CM | POA: Insufficient documentation

## 2014-03-13 DIAGNOSIS — I2510 Atherosclerotic heart disease of native coronary artery without angina pectoris: Secondary | ICD-10-CM | POA: Insufficient documentation

## 2014-03-13 DIAGNOSIS — I447 Left bundle-branch block, unspecified: Secondary | ICD-10-CM | POA: Insufficient documentation

## 2014-03-13 NOTE — Progress Notes (Signed)
Patient ID: Laura [name], female   DOB: 03-Jan-1924, 80 y.o.   MRN: [id number]      Reason for office visit Loop recorder followup  Laura [name] is a very healthy 80 year old woman who received a implantable loop recorder in 2013 after 2 back-to-back episodes of unexplained and unheralded syncope. She has not had any syncopal events since the loop recorder was implanted and the device has not recorded any meaningful arrhythmia. She does not have any new health problems. She lives in assisted living up tilt for house. She is accompanied today by her son. She does not have any cardiac complaints except some mild swelling occasionally in her left leg. Is occasional fleeting chest discomfort that sounds musculoskeletal.  She had incidental note of a rate related left bundle branch block in 2013. Today her echocardiogram shows normal sinus rhythm with a QS pattern in leads V1 and V2 A CT of the chest showed evidence of emphysema and moderate atheromatous vascular calcification including in the coronary arteries. We had recommended echocardiography and carotid ultrasonography at the time of her syncopal events in 2013: I don't think these have ever been performed.   Allergies  Allergen Reactions  . Codeine     Makes her ill    Current Outpatient Prescriptions  Medication Sig Dispense Refill  . acetaminophen (MAPAP) 500 MG tablet Take 500 mg by mouth every 6 (six) hours as needed.      Marland Kitchen alum & mag hydroxide-simeth (MI-ACID MAXIMUM STRENGTH) 400-400-40 MG/5ML suspension Take by mouth every 6 (six) hours as needed for indigestion.      . cholecalciferol (VITAMIN D) 1000 UNITS tablet Take 1,000 Units by mouth daily.      Marland Kitchen guaifenesin (Q-TUSSIN) 100 MG/5ML syrup Take 200 mg by mouth 4 (four) times daily as needed for cough.      . loperamide (IMODIUM) 2 MG capsule Take by mouth as needed for diarrhea or loose stools. NOT TO EXCEED 8 DOSES IN 24 HR      . LORazepam (ATIVAN) 0.5 MG tablet Take  0.5 mg by mouth every 6 (six) hours as needed for anxiety. FOR AGITATION      . magnesium hydroxide (MILK OF MAGNESIA) 400 MG/5ML suspension Take by mouth daily as needed for mild constipation.      . Naphazoline-Glycerin-Zinc Sulf (CLEAR EYES MAXIMUM ITCHY EYE OP) Apply to eye. INSTILL 1 TO 2 DROPS IN [facility] EYE THREE TIMES A DAY AS NEEDED FOR ICTHING AND REDNESS. WAIT 3-5 MINUTES BETWEEN EACH DROP      . Neomycin-Bacitracin-Polymyxin (HCA TRIPLE ANTIBIOTIC OINTMENT EX) Apply topically as needed.      Marland Kitchen PARoxetine (PAXIL) 40 MG tablet Take 40 mg by mouth every morning.      . polyethylene glycol (MIRALAX / GLYCOLAX) packet Take 17 g by mouth daily.      . temazepam (RESTORIL) 7.5 MG capsule Take 7.5 mg by mouth at bedtime.      . traMADol (ULTRAM) 50 MG tablet Take 100 mg by mouth every 6 (six) hours as needed for pain.        No current facility-administered medications for this visit.    Past Medical History  Diagnosis Date  . Hypertension   . Arthritis   . Alzheimer's dementia   . Anxiety   . Depression     Past Surgical History  Procedure Laterality Date  . Shoulder surgery      Left     No family history on file.  History   Social History  . Marital Status: Divorced    Spouse Name: N/A    Number of Children: N/A  . Years of Education: N/A   Occupational History  . Not on file.   Social History Main Topics  . Smoking status: Former Research scientist (life sciences)  . Smokeless tobacco: Not on file  . Alcohol Use: No  . Drug Use: No  . Sexual Activity: Not on file   Other Topics Concern  . Not on file   Social History Narrative  . No narrative on file    Review of systems: The patient specifically denies any chest pain at rest or with exertion, dyspnea at rest or with exertion, orthopnea, paroxysmal nocturnal dyspnea, syncope, palpitations, focal neurological deficits, intermittent claudication, lower extremity edema, unexplained weight gain, cough, hemoptysis or wheezing.  The  patient also denies abdominal pain, nausea, vomiting, dysphagia, diarrhea, constipation, polyuria, polydipsia, dysuria, hematuria, frequency, urgency, abnormal bleeding or bruising, fever, chills, unexpected weight changes, mood swings, change in skin or hair texture, change in voice quality, auditory or visual problems, allergic reactions or rashes, new musculoskeletal complaints other than usual "aches and pains".   PHYSICAL EXAM BP 110/72  Pulse 95  Resp 16  Ht 5\' 4"  (1.626 m)  Wt 160 lb 11.2 oz (72.893 kg)  BMI 27.57 kg/m2  General: Alert, oriented x3, no distress Head: no evidence of trauma, PERRL, EOMI, no exophtalmos or lid lag, no myxedema, no xanthelasma; normal ears, nose and oropharynx Neck: normal jugular venous pulsations and no hepatojugular reflux; brisk carotid pulses without delay and no carotid bruits Chest: clear to auscultation, no signs of consolidation by percussion or palpation, normal fremitus, symmetrical and full respiratory excursions, healthy loop recorder site Cardiovascular: normal position and quality of the apical impulse, regular rhythm, normal first and second heart sounds, no murmurs, rubs or gallops Abdomen: no tenderness or distention, no masses by palpation, no abnormal pulsatility or arterial bruits, normal bowel sounds, no hepatosplenomegaly Extremities: no clubbing, cyanosis or edema; 2+ radial, ulnar and brachial pulses bilaterally; 2+ right femoral, posterior tibial and dorsalis pedis pulses; 2+ left femoral, posterior tibial and dorsalis pedis pulses; no subclavian or femoral bruits Neurological: grossly nonfocal   EKG: Sinus rhythm, QS in leads V1 V2, no repolarization abnormalities  Lipid Panel  No results found for this basename: chol, trig, hdl, cholhdl, vldl, ldlcalc    BMET    Component Value Date/Time   NA 139 01/17/2013 1050   K 4.1 01/17/2013 1050   CL 103 01/17/2013 1050   CO2 27 01/17/2013 1050   GLUCOSE 77 01/17/2013 1050   BUN 18  01/17/2013 1050   CREATININE 1.09 01/17/2013 1050   CALCIUM 9.2 01/17/2013 1050   GFRNONAA 45* 01/17/2013 1050   GFRAA 52* 01/17/2013 1050     ASSESSMENT AND PLAN  In the almost 2 years since loop recorder implantation Laura [name] has had neither syncope, nor any arrhythmia recorded on her loop recorder. At this point assessment for structural heart disease with echo or carotid stenosis with Doppler ultrasound appears less pressing, although her syncopal events remained mysterious. She had a rate related left bundle branch block raising the possibility of intermittent AV block but nothing has been recorded in 2 years. No changes are recommended her medications today. Will continue remote downloads from her loop recorder. Patient Instructions  Remote monitoring is used to monitor your loop recorder from home. This monitoring reduces the number of office visits required to check your device to  one time per year. It allows [place] to keep an eye on the functioning of your device to ensure it is working properly. You are scheduled for a device check from home on 06-17-2014. You may send your transmission at any time that day. If you have a wireless device, the transmission will be sent automatically. After your physician reviews your transmission, you will receive a postcard with your next transmission date.  Your physician recommends that you schedule a follow-up appointment in: 12 months with Dr.Bayan Kushnir   Continue with current medications    Orders Placed This Encounter  Procedures  . Implantable device check   Meds ordered this encounter  Medications  . temazepam (RESTORIL) 7.5 MG capsule    Sig: Take 7.5 mg by mouth at bedtime.  . Naphazoline-Glycerin-Zinc Sulf (CLEAR EYES MAXIMUM ITCHY EYE OP)    Sig: Apply to eye. INSTILL 1 TO 2 DROPS IN [facility] EYE THREE TIMES A DAY AS NEEDED FOR ICTHING AND REDNESS. WAIT 3-5 MINUTES BETWEEN EACH DROP  . loperamide (IMODIUM) 2 MG capsule    Sig: Take by mouth as  needed for diarrhea or loose stools. NOT TO EXCEED 8 DOSES IN 24 HR  . LORazepam (ATIVAN) 0.5 MG tablet    Sig: Take 0.5 mg by mouth every 6 (six) hours as needed for anxiety. FOR AGITATION  . acetaminophen (MAPAP) 500 MG tablet    Sig: Take 500 mg by mouth every 6 (six) hours as needed.  Marland Kitchen alum & mag hydroxide-simeth (MI-ACID MAXIMUM STRENGTH) 400-400-40 MG/5ML suspension    Sig: Take by mouth every 6 (six) hours as needed for indigestion.  . magnesium hydroxide (MILK OF MAGNESIA) 400 MG/5ML suspension    Sig: Take by mouth daily as needed for mild constipation.  Marland Kitchen guaifenesin (Q-TUSSIN) 100 MG/5ML syrup    Sig: Take 200 mg by mouth 4 (four) times daily as needed for cough.  . Neomycin-Bacitracin-Polymyxin (HCA TRIPLE ANTIBIOTIC OINTMENT EX)    Sig: Apply topically as needed.    Holli Humbles, MD, [place] [phone] office [phone] pager

## 2014-04-01 ENCOUNTER — Encounter: Payer: Self-pay | Admitting: Cardiovascular Disease

## 2014-06-17 ENCOUNTER — Telehealth: Payer: Self-pay | Admitting: Cardiology

## 2014-06-17 ENCOUNTER — Encounter: Payer: Medicare Other | Admitting: *Deleted

## 2014-06-17 NOTE — Telephone Encounter (Signed)
Confirmed remote transmission w/ pt nurse.   

## 2014-06-20 ENCOUNTER — Emergency Department (HOSPITAL_COMMUNITY)
Admission: EM | Admit: 2014-06-20 | Discharge: 2014-06-20 | Disposition: A | Discharge status: Home / Self Care | Payer: Medicare Other | Attending: Emergency Medicine | Admitting: Emergency Medicine

## 2014-06-20 ENCOUNTER — Emergency Department (HOSPITAL_COMMUNITY): Payer: Medicare Other

## 2014-06-20 ENCOUNTER — Encounter (HOSPITAL_COMMUNITY): Payer: Self-pay | Admitting: Emergency Medicine

## 2014-06-20 DIAGNOSIS — F419 Anxiety disorder, unspecified: Secondary | ICD-10-CM | POA: Diagnosis not present

## 2014-06-20 DIAGNOSIS — S0081XA Abrasion of other part of head, initial encounter: Secondary | ICD-10-CM | POA: Diagnosis not present

## 2014-06-20 DIAGNOSIS — Y92122 Bedroom in nursing home as the place of occurrence of the external cause: Secondary | ICD-10-CM | POA: Diagnosis not present

## 2014-06-20 DIAGNOSIS — I10 Essential (primary) hypertension: Secondary | ICD-10-CM | POA: Diagnosis not present

## 2014-06-20 DIAGNOSIS — F0280 Dementia in other diseases classified elsewhere without behavioral disturbance: Secondary | ICD-10-CM | POA: Diagnosis not present

## 2014-06-20 DIAGNOSIS — F329 Major depressive disorder, single episode, unspecified: Secondary | ICD-10-CM | POA: Diagnosis not present

## 2014-06-20 DIAGNOSIS — Y998 Other external cause status: Secondary | ICD-10-CM | POA: Insufficient documentation

## 2014-06-20 DIAGNOSIS — M1990 Unspecified osteoarthritis, unspecified site: Secondary | ICD-10-CM | POA: Insufficient documentation

## 2014-06-20 DIAGNOSIS — W06XXXA Fall from bed, initial encounter: Secondary | ICD-10-CM | POA: Diagnosis not present

## 2014-06-20 DIAGNOSIS — Y9389 Activity, other specified: Secondary | ICD-10-CM | POA: Diagnosis not present

## 2014-06-20 DIAGNOSIS — N390 Urinary tract infection, site not specified: Secondary | ICD-10-CM

## 2014-06-20 DIAGNOSIS — G309 Alzheimer's disease, unspecified: Secondary | ICD-10-CM | POA: Insufficient documentation

## 2014-06-20 DIAGNOSIS — W19XXXA Unspecified fall, initial encounter: Secondary | ICD-10-CM

## 2014-06-20 DIAGNOSIS — Z791 Long term (current) use of non-steroidal anti-inflammatories (NSAID): Secondary | ICD-10-CM | POA: Diagnosis not present

## 2014-06-20 DIAGNOSIS — S0990XA Unspecified injury of head, initial encounter: Secondary | ICD-10-CM | POA: Diagnosis present

## 2014-06-20 DIAGNOSIS — Z87891 Personal history of nicotine dependence: Secondary | ICD-10-CM | POA: Diagnosis not present

## 2014-06-20 DIAGNOSIS — Z79899 Other long term (current) drug therapy: Secondary | ICD-10-CM | POA: Insufficient documentation

## 2014-06-20 LAB — CBC WITH DIFFERENTIAL/PLATELET
BASOS PCT: 0 % (ref 0–1)
Basophils Absolute: 0 10*3/uL (ref 0.0–0.1)
Eosinophils Absolute: 0.2 10*3/uL (ref 0.0–0.7)
Eosinophils Relative: 2 % (ref 0–5)
HCT: 47.9 % — ABNORMAL HIGH (ref 36.0–46.0)
Hemoglobin: 15.3 g/dL — ABNORMAL HIGH (ref 12.0–15.0)
Lymphocytes Relative: 17 % (ref 12–46)
Lymphs Abs: 1.2 10*3/uL (ref 0.7–4.0)
MCH: 28.9 pg (ref 26.0–34.0)
MCHC: 31.9 g/dL (ref 30.0–36.0)
MCV: 90.4 fL (ref 78.0–100.0)
Monocytes Absolute: 0.5 10*3/uL (ref 0.1–1.0)
Monocytes Relative: 7 % (ref 3–12)
NEUTROS PCT: 74 % (ref 43–77)
Neutro Abs: 5.4 10*3/uL (ref 1.7–7.7)
PLATELETS: 159 10*3/uL (ref 150–400)
RBC: 5.3 MIL/uL — ABNORMAL HIGH (ref 3.87–5.11)
RDW: 14 % (ref 11.5–15.5)
WBC: 7.4 10*3/uL (ref 4.0–10.5)

## 2014-06-20 LAB — URINALYSIS, ROUTINE W REFLEX MICROSCOPIC
Bilirubin Urine: NEGATIVE
GLUCOSE, UA: NEGATIVE mg/dL
KETONES UR: NEGATIVE mg/dL
LEUKOCYTES UA: NEGATIVE
NITRITE: NEGATIVE
Protein, ur: NEGATIVE mg/dL
Specific Gravity, Urine: 1.02 (ref 1.005–1.030)
Urobilinogen, UA: 0.2 mg/dL (ref 0.0–1.0)
pH: 6 (ref 5.0–8.0)

## 2014-06-20 LAB — BASIC METABOLIC PANEL
Anion gap: 12 (ref 5–15)
BUN: 23 mg/dL (ref 6–23)
CO2: 26 mEq/L (ref 19–32)
CREATININE: 1.21 mg/dL — AB (ref 0.50–1.10)
Calcium: 9.8 mg/dL (ref 8.4–10.5)
Chloride: 103 mEq/L (ref 96–112)
GFR, EST AFRICAN AMERICAN: 45 mL/min — AB (ref >90)
GFR, EST NON AFRICAN AMERICAN: 39 mL/min — AB (ref >90)
Glucose, Bld: 92 mg/dL (ref 70–99)
Potassium: 4.8 mEq/L (ref 3.7–5.3)
Sodium: 141 mEq/L (ref 137–147)

## 2014-06-20 LAB — URINE MICROSCOPIC-ADD ON

## 2014-06-20 LAB — TROPONIN I

## 2014-06-20 MED ORDER — CEPHALEXIN 500 MG PO CAPS: 500.0000 mg | ORAL_CAPSULE | Freq: Two times a day (BID) | ORAL | Status: DC

## 2014-06-20 MED ORDER — CEPHALEXIN 500 MG PO CAPS
500.0000 mg | ORAL_CAPSULE | Freq: Once | ORAL | Status: AC
  Administered 2014-06-20: 500 mg via ORAL
  Filled 2014-06-20: qty 1

## 2014-06-20 NOTE — ED Notes (Addendum)
Per EMS pt witnessed fall yesterday morning. No complaint or LOC. Facility reports pt states "fell last night" but able to get back in bed herself. Staff unsure if pt was reporting new fall or speaking of fall yesterday morning. Pt hx of dementia. Pt chronic left shoulder pain. Pt denies other complaint. Upon arrival to facility EMS report pt meet EMS at door; ambulatory on scene.

## 2014-06-20 NOTE — Progress Notes (Signed)
CSW met with Pt. There was no family at bedside. Pt comes to the ED because of fall. When CSW asked Pt where she lived she said she could not remember. Per chart, Pt has a history of dementia. Later during the interview the EMS arrived and informed CSW that she came from [facility] and they would be transporting her back there.  Willette Brace [phone] ED CSW 06/20/2014 8:30 PM

## 2014-06-20 NOTE — ED Provider Notes (Signed)
CSN: [id number]     Arrival date & time 06/20/14  0944 History   First MD Initiated Contact with Patient 06/20/14 1007     Chief Complaint  Patient presents with  . Fall      Patient is a 80 y.o. female presenting with fall. The history is provided by the patient, the EMS personnel and the nursing home. History limited by: Dementia.  Fall  Patient here for evaluation of fall. She has a history of dementia. Yesterday she was at the nursing home and had a witnessed fall, but no injuries were sustained. It is unclear what the cause of the fall was, but nursing home staff say she is not quite like herself and has been a little bit weaker in general thing usual. This morning when she was checked on by nursing home staff she reported to them that she had a fall last night was able to get herself back into the bed and she had sustained an abrasion to her forehead at that time. Patient has no complaints currently except for some slight dizziness. She denies fevers, chest pain, headache, shortness of breath, abdominal pain, vomiting, weakness. Symptoms are mild, intermittent, and improving.   Past Medical History  Diagnosis Date  . Hypertension   . Arthritis   . Alzheimer's dementia   . Anxiety   . Depression    Past Surgical History  Procedure Laterality Date  . Shoulder surgery      Left    No family history on file. History  Substance Use Topics  . Smoking status: Former Research scientist (life sciences)  . Smokeless tobacco: Not on file  . Alcohol Use: No   OB History    No data available     Review of Systems  All other systems reviewed and are negative.     Allergies  Codeine  Home Medications   Prior to Admission medications   Medication Sig Start Date End Date Taking? Authorizing Provider  acetaminophen (MAPAP) 500 MG tablet Take 500 mg by mouth every 6 (six) hours as needed for mild pain, fever or headache.    Yes Historical Provider, MD  alum & mag hydroxide-simeth (MI-ACID MAXIMUM  STRENGTH) 400-400-40 MG/5ML suspension Take 30 mLs by mouth every 6 (six) hours as needed for indigestion.    Yes Historical Provider, MD  Carbomer Gel Base (HYDROGEL) GEL Apply 1 application topically daily as needed (for rash). Apply to face   Yes Historical Provider, MD  cholecalciferol (VITAMIN D) 1000 UNITS tablet Take 1,000 Units by mouth daily with breakfast.    Yes Historical Provider, MD  guaifenesin (Q-TUSSIN) 100 MG/5ML syrup Take 200 mg by mouth every 6 (six) hours as needed for cough.    Yes Historical Provider, MD  HYDROcodone-acetaminophen (NORCO/VICODIN) 5-325 MG per tablet Take 1 tablet by mouth 3 (three) times daily as needed for moderate pain.   Yes Historical Provider, MD  loperamide (IMODIUM) 2 MG capsule Take by mouth as needed for diarrhea or loose stools. NOT TO EXCEED 8 DOSES IN 24 HR   Yes Historical Provider, MD  magnesium hydroxide (MILK OF MAGNESIA) 400 MG/5ML suspension Take 30 mLs by mouth daily as needed for mild constipation.    Yes Historical Provider, MD  meloxicam (MOBIC) 7.5 MG tablet Take 7.5 mg by mouth daily with breakfast.   Yes Historical Provider, MD  Naphazoline-Glycerin-Zinc Sulf (CLEAR EYES MAXIMUM ITCHY EYE OP) Place 1-2 drops into both eyes 3 (three) times daily as needed (for itching and redness). Wait  3 to 5 minutes between each drop   Yes Historical Provider, MD  Neomycin-Bacitracin-Polymyxin ([place]) Apply 1 application topically as needed (for wound care).    Yes Historical Provider, MD  NUTRITIONAL SUPPLEMENT LIQD Take 1 Bottle by mouth 3 (three) times daily.   Yes Historical Provider, MD  PARoxetine (PAXIL) 40 MG tablet Take 40 mg by mouth every morning.   Yes Historical Provider, MD  polyethylene glycol (MIRALAX / GLYCOLAX) packet Take 17 g by mouth daily with breakfast.    Yes Historical Provider, MD  temazepam (RESTORIL) 7.5 MG capsule Take 7.5 mg by mouth at bedtime. 03/09/14  Yes Historical Provider, MD   BP 153/80  mmHg  Pulse 97  Temp(Src) 97.9 F (36.6 C) (Oral)  Resp 16  SpO2 91% Physical Exam  Constitutional: She appears well-developed and well-nourished.  HENT:  Head: Normocephalic and atraumatic.  Small abrasion over left forehead  Eyes: Pupils are equal, round, and reactive to light.  Cardiovascular: Normal rate and regular rhythm.   No murmur heard. Pulmonary/Chest: Effort normal and breath sounds normal. No respiratory distress.  Abdominal: Soft. There is no tenderness. There is no rebound and no guarding.  Musculoskeletal: She exhibits no edema or tenderness.  No shoulder tenderness  Neurological: She is alert.  Disoriented to place and time. MAE symmetrically  Skin: Skin is warm and dry.  Psychiatric: She has a normal mood and affect. Her behavior is normal.  Nursing note and vitals reviewed.   ED Course  Procedures (including critical care time) Labs Review Labs Reviewed  BASIC METABOLIC PANEL - Abnormal; Notable for the following:    Creatinine, Ser 1.21 (*)    GFR calc non Af Amer 39 (*)    GFR calc Af Amer 45 (*)    All other components within normal limits  CBC WITH DIFFERENTIAL - Abnormal; Notable for the following:    RBC 5.30 (*)    Hemoglobin 15.3 (*)    HCT 47.9 (*)    All other components within normal limits  URINALYSIS, ROUTINE W REFLEX MICROSCOPIC - Abnormal; Notable for the following:    Hgb urine dipstick SMALL (*)    All other components within normal limits  URINE MICROSCOPIC-ADD ON - Abnormal; Notable for the following:    Bacteria, UA MANY (*)    All other components within normal limits  URINE CULTURE  TROPONIN I    Imaging Review Dg Chest 2 View  06/20/2014   CLINICAL DATA:  Difficulty breathing; fall 1 day prior.  Confusion.  EXAM: CHEST  2 VIEW  COMPARISON:  Chest radiograph and chest CT January 17, 2013  FINDINGS: There is a degree of underlying emphysematous change. There is pulmonary arterial hypertension, characterized by central pulmonary  artery prominence with rapid peripheral tapering. The heart size is within normal limits. There is no edema or consolidation. No adenopathy. No pneumothorax. Patient has undergone multiple kyphoplasty procedures. Bones are osteoporotic. There is a total shoulder replacement on the left. Monitor device noted in anterior left hemithorax. There are calcified breast implants bilaterally.  IMPRESSION: Emphysematous change with pulmonary arterial hypertension. No lung edema or consolidation. Bones osteoporotic.   Electronically Signed   By: Lowella Grip M.D.   On: 06/20/2014 11:09   Ct Head Wo Contrast  06/20/2014   CLINICAL DATA:  Fall 1 day prior  EXAM: CT HEAD WITHOUT CONTRAST  CT CERVICAL SPINE WITHOUT CONTRAST  TECHNIQUE: Multidetector CT imaging of the head and cervical spine was  performed following the standard protocol without intravenous contrast. Multiplanar CT image reconstructions of the cervical spine were also generated.  COMPARISON:  None.  FINDINGS: CT HEAD FINDINGS  There is moderate diffuse atrophy. There is no intracranial mass, hemorrhage, extra-axial fluid collection, or midline shift. There is patchy small vessel disease in the centra semiovale bilaterally. Elsewhere gray-white compartments appear normal. There is no acute appearing infarct. The bony calvarium appears intact. The mastoid air cells are clear.  CT CERVICAL SPINE FINDINGS  There is no fracture or spondylolisthesis. Prevertebral soft tissues and predental space regions are normal. There is moderate disc space narrowing at C5-6 and C7-T1. There is facet osteoarthritic change at most levels bilaterally. No frank disc extrusion or stenosis. Bones are osteoporotic. There is calcification in each carotid artery. There is diffuse enlargement of the right lobe of the thyroid compared to the left side.  IMPRESSION: CT head: Atrophy with periventricular small vessel disease. No intracranial mass, hemorrhage, or extra-axial fluid  collection. No acute appearing infarct.  CT cervical spine: No apparent fracture or spondylolisthesis. Multilevel osteoarthritic change in spondylosis. Carotid artery calcification bilaterally. Diffuse enlargement of the right lobe of the thyroid compared to the left. This finding may warrant nonemergent thyroid ultrasound to further evaluate.   Electronically Signed   By: Lowella Grip M.D.   On: 06/20/2014 11:23   Ct Cervical Spine Wo Contrast  06/20/2014   CLINICAL DATA:  Fall 1 day prior  EXAM: CT HEAD WITHOUT CONTRAST  CT CERVICAL SPINE WITHOUT CONTRAST  TECHNIQUE: Multidetector CT imaging of the head and cervical spine was performed following the standard protocol without intravenous contrast. Multiplanar CT image reconstructions of the cervical spine were also generated.  COMPARISON:  None.  FINDINGS: CT HEAD FINDINGS  There is moderate diffuse atrophy. There is no intracranial mass, hemorrhage, extra-axial fluid collection, or midline shift. There is patchy small vessel disease in the centra semiovale bilaterally. Elsewhere gray-white compartments appear normal. There is no acute appearing infarct. The bony calvarium appears intact. The mastoid air cells are clear.  CT CERVICAL SPINE FINDINGS  There is no fracture or spondylolisthesis. Prevertebral soft tissues and predental space regions are normal. There is moderate disc space narrowing at C5-6 and C7-T1. There is facet osteoarthritic change at most levels bilaterally. No frank disc extrusion or stenosis. Bones are osteoporotic. There is calcification in each carotid artery. There is diffuse enlargement of the right lobe of the thyroid compared to the left side.  IMPRESSION: CT head: Atrophy with periventricular small vessel disease. No intracranial mass, hemorrhage, or extra-axial fluid collection. No acute appearing infarct.  CT cervical spine: No apparent fracture or spondylolisthesis. Multilevel osteoarthritic change in spondylosis. Carotid  artery calcification bilaterally. Diffuse enlargement of the right lobe of the thyroid compared to the left. This finding may warrant nonemergent thyroid ultrasound to further evaluate.   Electronically Signed   By: Lowella Grip M.D.   On: 06/20/2014 11:23     EKG Interpretation   Date/Time:  Thursday June 20 2014 11:09:17 EST Ventricular Rate:  92 PR Interval:  204 QRS Duration: 65 QT Interval:  341 QTC Calculation: 422 R Axis:   65 Text Interpretation:  Sinus rhythm Anteroseptal infarct, age indeterminate  Confirmed by Hazle Coca [phone]) on 06/20/2014 12:07:12 PM      MDM   Final diagnoses:  Acute UTI (urinary tract infection)  Fall, initial encounter    Patient here for evaluation of injuries following fall, patient has no complaints in the emergency  department. Screening laboratory studies obtained given the history of patient may be being a little more weak now than usual and patient having a history of 2 falls in the last 24 hours. UA is concerning for urinary tract infection, will culture given the patient being a resident of nursing home facility. Clinical picture is not consistent with ACS, PE. Patient given prescription for Keflex.    Quintella Reichert, MD 06/20/14 [phone]

## 2014-06-20 NOTE — ED Notes (Signed)
Patient tried to urinate, was unable to go, will try again soon.    Per Dr. Ralene Bathe... Patient was given water and will try again in 15-20 minutes.

## 2014-06-20 NOTE — Discharge Instructions (Signed)
Laura [name] was evaluated in the emergency department today for a fall. Her urinalysis appears that she has a urinary tract infection, which will be treated with Keflex. She had a CT scan of her head and neck which showed some chronic changes. She also had an irregular enlargement of her thyroid gland that will need to be followed up by her family doctor. Her kidney function is slightly abnormal with an elevated creatinine of 1.2, this also needs to be followed up by her family doctor.  Urinary Tract Infection Urinary tract infections (UTIs) can develop anywhere along your urinary tract. Your urinary tract is your body's drainage system for removing wastes and extra water. Your urinary tract includes two kidneys, two ureters, a bladder, and a urethra. Your kidneys are a pair of bean-shaped organs. Each kidney is about the size of your fist. They are located below your ribs, one on each side of your spine. CAUSES Infections are caused by microbes, which are microscopic organisms, including fungi, viruses, and bacteria. These organisms are so small that they can only be seen through a microscope. Bacteria are the microbes that most commonly cause UTIs. SYMPTOMS  Symptoms of UTIs may vary by age and gender of the patient and by the location of the infection. Symptoms in young women typically include a frequent and intense urge to urinate and a painful, burning feeling in the bladder or urethra during urination. Older women and men are more likely to be tired, shaky, and weak and have muscle aches and abdominal pain. A fever may mean the infection is in your kidneys. Other symptoms of a kidney infection include pain in your back or sides below the ribs, nausea, and vomiting. DIAGNOSIS To diagnose a UTI, your caregiver will ask you about your symptoms. Your caregiver also will ask to provide a urine sample. The urine sample will be tested for bacteria and white blood cells. White blood cells are made by your  body to help fight infection. TREATMENT  Typically, UTIs can be treated with medication. Because most UTIs are caused by a bacterial infection, they usually can be treated with the use of antibiotics. The choice of antibiotic and length of treatment depend on your symptoms and the type of bacteria causing your infection. HOME CARE INSTRUCTIONS  If you were prescribed antibiotics, take them exactly as your caregiver instructs you. Finish the medication even if you feel better after you have only taken some of the medication.  Drink enough water and fluids to keep your urine clear or pale yellow.  Avoid caffeine, tea, and carbonated beverages. They tend to irritate your bladder.  Empty your bladder often. Avoid holding urine for long periods of time.  Empty your bladder before and after sexual intercourse.  After a bowel movement, women should cleanse from front to back. Use each tissue only once. SEEK MEDICAL CARE IF:   You have back pain.  You develop a fever.  Your symptoms do not begin to resolve within 3 days. SEEK IMMEDIATE MEDICAL CARE IF:   You have severe back pain or lower abdominal pain.  You develop chills.  You have nausea or vomiting.  You have continued burning or discomfort with urination. MAKE SURE YOU:   Understand these instructions.  Will watch your condition.  Will get help right away if you are not doing well or get worse. Document Released: 04/14/2005 Document Revised: 01/04/2012 Document Reviewed: 08/13/2011 [facility] Patient Information 2015 [place], [place]. This information is not intended to replace advice  given to you by your health care provider. Make sure you discuss any questions you have with your health care provider.

## 2014-06-20 NOTE — ED Notes (Signed)
Bed: XK55 Expected date:  Expected time:  Means of arrival:  Comments: Falls weakness

## 2014-06-21 ENCOUNTER — Encounter: Payer: Self-pay | Admitting: Cardiology

## 2014-06-22 LAB — URINE CULTURE
COLONY COUNT: NO GROWTH
Culture: NO GROWTH

## 2014-06-27 ENCOUNTER — Encounter (HOSPITAL_COMMUNITY): Payer: Self-pay | Admitting: Cardiovascular Disease

## 2014-08-13 DIAGNOSIS — J209 Acute bronchitis, unspecified: Secondary | ICD-10-CM | POA: Diagnosis not present

## 2014-08-13 DIAGNOSIS — I10 Essential (primary) hypertension: Secondary | ICD-10-CM | POA: Diagnosis not present

## 2014-08-13 DIAGNOSIS — G309 Alzheimer's disease, unspecified: Secondary | ICD-10-CM | POA: Diagnosis not present

## 2014-08-20 DIAGNOSIS — B379 Candidiasis, unspecified: Secondary | ICD-10-CM | POA: Diagnosis not present

## 2014-08-20 DIAGNOSIS — I10 Essential (primary) hypertension: Secondary | ICD-10-CM | POA: Diagnosis not present

## 2014-08-20 DIAGNOSIS — G309 Alzheimer's disease, unspecified: Secondary | ICD-10-CM | POA: Diagnosis not present

## 2014-08-20 DIAGNOSIS — N760 Acute vaginitis: Secondary | ICD-10-CM | POA: Diagnosis not present

## 2014-09-16 DIAGNOSIS — M79676 Pain in unspecified toe(s): Secondary | ICD-10-CM | POA: Diagnosis not present

## 2014-09-16 DIAGNOSIS — B351 Tinea unguium: Secondary | ICD-10-CM | POA: Diagnosis not present

## 2014-09-17 DIAGNOSIS — B373 Candidiasis of vulva and vagina: Secondary | ICD-10-CM | POA: Diagnosis not present

## 2014-09-17 DIAGNOSIS — R300 Dysuria: Secondary | ICD-10-CM | POA: Diagnosis not present

## 2014-10-07 ENCOUNTER — Emergency Department (HOSPITAL_COMMUNITY): Payer: Medicare Other

## 2014-10-07 ENCOUNTER — Inpatient Hospital Stay (HOSPITAL_COMMUNITY)
Admission: EM | Admit: 2014-10-07 | Discharge: 2014-10-09 | DRG: 206 | Disposition: A | Discharge status: Skilled Nursing Facility | Payer: Medicare Other | Attending: Internal Medicine | Admitting: Internal Medicine

## 2014-10-07 DIAGNOSIS — S199XXA Unspecified injury of neck, initial encounter: Secondary | ICD-10-CM | POA: Diagnosis not present

## 2014-10-07 DIAGNOSIS — I10 Essential (primary) hypertension: Secondary | ICD-10-CM | POA: Diagnosis present

## 2014-10-07 DIAGNOSIS — F0280 Dementia in other diseases classified elsewhere without behavioral disturbance: Secondary | ICD-10-CM | POA: Diagnosis present

## 2014-10-07 DIAGNOSIS — M549 Dorsalgia, unspecified: Secondary | ICD-10-CM | POA: Diagnosis not present

## 2014-10-07 DIAGNOSIS — Z87891 Personal history of nicotine dependence: Secondary | ICD-10-CM

## 2014-10-07 DIAGNOSIS — N390 Urinary tract infection, site not specified: Secondary | ICD-10-CM | POA: Diagnosis present

## 2014-10-07 DIAGNOSIS — E872 Acidosis: Secondary | ICD-10-CM | POA: Diagnosis present

## 2014-10-07 DIAGNOSIS — S299XXA Unspecified injury of thorax, initial encounter: Secondary | ICD-10-CM | POA: Diagnosis not present

## 2014-10-07 DIAGNOSIS — B9689 Other specified bacterial agents as the cause of diseases classified elsewhere: Secondary | ICD-10-CM | POA: Diagnosis not present

## 2014-10-07 DIAGNOSIS — G309 Alzheimer's disease, unspecified: Secondary | ICD-10-CM | POA: Diagnosis present

## 2014-10-07 DIAGNOSIS — N179 Acute kidney failure, unspecified: Secondary | ICD-10-CM | POA: Diagnosis not present

## 2014-10-07 DIAGNOSIS — R0902 Hypoxemia: Principal | ICD-10-CM | POA: Diagnosis present

## 2014-10-07 DIAGNOSIS — R0602 Shortness of breath: Secondary | ICD-10-CM | POA: Diagnosis present

## 2014-10-07 DIAGNOSIS — M546 Pain in thoracic spine: Secondary | ICD-10-CM | POA: Diagnosis not present

## 2014-10-07 DIAGNOSIS — T148 Other injury of unspecified body region: Secondary | ICD-10-CM | POA: Diagnosis not present

## 2014-10-07 DIAGNOSIS — F329 Major depressive disorder, single episode, unspecified: Secondary | ICD-10-CM | POA: Diagnosis present

## 2014-10-07 DIAGNOSIS — F419 Anxiety disorder, unspecified: Secondary | ICD-10-CM | POA: Diagnosis present

## 2014-10-07 DIAGNOSIS — S0990XA Unspecified injury of head, initial encounter: Secondary | ICD-10-CM | POA: Diagnosis not present

## 2014-10-07 DIAGNOSIS — G8929 Other chronic pain: Secondary | ICD-10-CM | POA: Diagnosis present

## 2014-10-07 DIAGNOSIS — J9610 Chronic respiratory failure, unspecified whether with hypoxia or hypercapnia: Secondary | ICD-10-CM | POA: Diagnosis present

## 2014-10-07 DIAGNOSIS — Z79899 Other long term (current) drug therapy: Secondary | ICD-10-CM

## 2014-10-07 DIAGNOSIS — M1990 Unspecified osteoarthritis, unspecified site: Secondary | ICD-10-CM | POA: Diagnosis present

## 2014-10-07 DIAGNOSIS — J439 Emphysema, unspecified: Secondary | ICD-10-CM | POA: Diagnosis present

## 2014-10-07 DIAGNOSIS — W19XXXA Unspecified fall, initial encounter: Secondary | ICD-10-CM

## 2014-10-07 DIAGNOSIS — J449 Chronic obstructive pulmonary disease, unspecified: Secondary | ICD-10-CM | POA: Diagnosis not present

## 2014-10-07 LAB — CBC WITH DIFFERENTIAL/PLATELET
BASOS PCT: 0 % (ref 0–1)
Basophils Absolute: 0 10*3/uL (ref 0.0–0.1)
EOS PCT: 5 % (ref 0–5)
Eosinophils Absolute: 0.4 10*3/uL (ref 0.0–0.7)
HEMATOCRIT: 48.5 % — AB (ref 36.0–46.0)
Hemoglobin: 15 g/dL (ref 12.0–15.0)
Lymphocytes Relative: 24 % (ref 12–46)
Lymphs Abs: 2 10*3/uL (ref 0.7–4.0)
MCH: 28.5 pg (ref 26.0–34.0)
MCHC: 30.9 g/dL (ref 30.0–36.0)
MCV: 92 fL (ref 78.0–100.0)
MONO ABS: 0.9 10*3/uL (ref 0.1–1.0)
Monocytes Relative: 11 % (ref 3–12)
Neutro Abs: 5 10*3/uL (ref 1.7–7.7)
Neutrophils Relative %: 60 % (ref 43–77)
Platelets: 200 10*3/uL (ref 150–400)
RBC: 5.27 MIL/uL — ABNORMAL HIGH (ref 3.87–5.11)
RDW: 13.6 % (ref 11.5–15.5)
WBC: 8.2 10*3/uL (ref 4.0–10.5)

## 2014-10-07 LAB — BASIC METABOLIC PANEL
Anion gap: 7 (ref 5–15)
BUN: 31 mg/dL — ABNORMAL HIGH (ref 6–23)
CO2: 28 mmol/L (ref 19–32)
CREATININE: 1.35 mg/dL — AB (ref 0.50–1.10)
Calcium: 9.4 mg/dL (ref 8.4–10.5)
Chloride: 106 mmol/L (ref 96–112)
GFR calc Af Amer: 39 mL/min — ABNORMAL LOW (ref >90)
GFR, EST NON AFRICAN AMERICAN: 34 mL/min — AB (ref >90)
GLUCOSE: 92 mg/dL (ref 70–99)
Potassium: 4.6 mmol/L (ref 3.5–5.1)
Sodium: 141 mmol/L (ref 135–145)

## 2014-10-07 LAB — I-STAT TROPONIN, ED: Troponin i, poc: 0 ng/mL (ref 0.00–0.08)

## 2014-10-07 MED ORDER — HYDROMORPHONE HCL 1 MG/ML IJ SOLN: 1.0000 mg | Freq: Once | INTRAMUSCULAR | Status: DC

## 2014-10-07 NOTE — ED Notes (Signed)
RT paged.

## 2014-10-07 NOTE — ED Notes (Signed)
Bed: WA02 Expected date:  Expected time:  Means of arrival:  Comments: EMS- elderly fall 

## 2014-10-07 NOTE — ED Provider Notes (Signed)
CSN: [id number]     Arrival date & time 10/07/14  2012 History   First MD Initiated Contact with Patient 10/07/14 2114     Chief Complaint  Patient presents with  . fall/ neck and back pain      The history is provided by the patient and the nursing home. No language interpreter was used.   Ms. Cribb presents for evaluation of neck pain and back pain following a fall. Level V caveat due to dementia. The patient she had a fall because she was moving and she fell and hit her head and has head and neck pain and upper back pain. She thinks she may have passed out. She denies any recent illnesses. Per nursing home reports patient had an unwitnessed fall and was found sitting on her bottom in her room complaining of back pain. They deny any recent illnesses.  Past Medical History  Diagnosis Date  . Hypertension   . Arthritis   . Alzheimer's dementia   . Anxiety   . Depression    Past Surgical History  Procedure Laterality Date  . Shoulder surgery      Left   . Loop recorder implant N/A 06/22/2012    Procedure: LOOP RECORDER IMPLANT;  Surgeon: Sanda Klein, MD;  Location: [facility];  Service: Cardiovascular;  Laterality: N/A;   No family history on file. History  Substance Use Topics  . Smoking status: Former Research scientist (life sciences)  . Smokeless tobacco: Not on file  . Alcohol Use: No   OB History    No data available     Review of Systems  All other systems reviewed and are negative.     Allergies  Codeine  Home Medications   Prior to Admission medications   Medication Sig Start Date End Date Taking? Authorizing Provider  cholecalciferol (VITAMIN D) 1000 UNITS tablet Take 1,000 Units by mouth daily with breakfast.    Yes Historical Provider, MD  meloxicam (MOBIC) 7.5 MG tablet Take 7.5 mg by mouth daily with breakfast.   Yes Historical Provider, MD  NUTRITIONAL SUPPLEMENT LIQD Take 1 Bottle by mouth 3 (three) times daily.   Yes Historical Provider, MD  PARoxetine (PAXIL) 40 MG  tablet Take 40 mg by mouth every morning.   Yes Historical Provider, MD  temazepam (RESTORIL) 7.5 MG capsule Take 7.5 mg by mouth at bedtime. 03/09/14  Yes Historical Provider, MD  acetaminophen (MAPAP) 500 MG tablet Take 500 mg by mouth every 6 (six) hours as needed for mild pain, fever or headache.     Historical Provider, MD  alum & mag hydroxide-simeth (MI-ACID MAXIMUM STRENGTH) 400-400-40 MG/5ML suspension Take 30 mLs by mouth every 6 (six) hours as needed for indigestion.     Historical Provider, MD  Carbomer Gel Base (HYDROGEL) GEL Apply 1 application topically daily as needed (for rash). Apply to face    Historical Provider, MD  cephALEXin (KEFLEX) 500 MG capsule Take 1 capsule (500 mg total) by mouth 2 (two) times daily. Patient not taking: Reported on 10/07/2014 06/20/14   Quintella Reichert, MD  guaifenesin (Q-TUSSIN) 100 MG/5ML syrup Take 200 mg by mouth every 6 (six) hours as needed for cough.     Historical Provider, MD  HYDROcodone-acetaminophen (NORCO/VICODIN) 5-325 MG per tablet Take 1 tablet by mouth 3 (three) times daily as needed for moderate pain.    Historical Provider, MD  loperamide (IMODIUM) 2 MG capsule Take 2 mg by mouth as needed for diarrhea or loose stools. NOT TO EXCEED  8 DOSES IN 24 HR    Historical Provider, MD  magnesium hydroxide (MILK OF MAGNESIA) 400 MG/5ML suspension Take 30 mLs by mouth daily as needed for mild constipation.     Historical Provider, MD  Naphazoline-Glycerin-Zinc Sulf (CLEAR EYES MAXIMUM ITCHY EYE OP) Place 1-2 drops into both eyes 3 (three) times daily as needed (for itching and redness). Wait 3 to 5 minutes between each drop    Historical Provider, MD  Neomycin-Bacitracin-Polymyxin (HCA TRIPLE ANTIBIOTIC OINTMENT EX) Apply 1 application topically as needed (for wound care).     Historical Provider, MD  polyethylene glycol (MIRALAX / GLYCOLAX) packet Take 17 g by mouth daily as needed for mild constipation.     Historical Provider, MD   BP 153/117 mmHg   Pulse 83  Temp(Src) 98.1 F (36.7 C) (Oral)  Resp 18  SpO2 91% Physical Exam  Constitutional: She appears well-developed and well-nourished.  HENT:  Head: Normocephalic and atraumatic.  Cardiovascular: Normal rate and regular rhythm.   No murmur heard. Pulmonary/Chest: Effort normal and breath sounds normal. No respiratory distress.  Abdominal: Soft. There is no tenderness. There is no rebound and no guarding.  Musculoskeletal: She exhibits no edema or tenderness.  No hip tenderness to palpation. No arm or shoulder tenderness to palpation. Ranges all extremities. Mild lower cervical and upper thoracic tenderness to palpation  Neurological: She is alert.  Disoriented to place. Moves all extremities symmetrically.  Skin: Skin is warm and dry.  Psychiatric: She has a normal mood and affect. Her behavior is normal.  Nursing note and vitals reviewed.   ED Course  Procedures (including critical care time) Labs Review Labs Reviewed  BASIC METABOLIC PANEL - Abnormal; Notable for the following:    BUN 31 (*)    Creatinine, Ser 1.35 (*)    GFR calc non Af Amer 34 (*)    GFR calc Af Amer 39 (*)    All other components within normal limits  CBC WITH DIFFERENTIAL/PLATELET - Abnormal; Notable for the following:    RBC 5.27 (*)    HCT 48.5 (*)    All other components within normal limits  URINALYSIS, ROUTINE W REFLEX MICROSCOPIC - Abnormal; Notable for the following:    APPearance CLOUDY (*)    Hgb urine dipstick TRACE (*)    Nitrite POSITIVE (*)    Leukocytes, UA LARGE (*)    All other components within normal limits  BLOOD GAS, ARTERIAL - Abnormal; Notable for the following:    pH, Arterial 7.342 (*)    pCO2 arterial 52.7 (*)    Bicarbonate 27.9 (*)    All other components within normal limits  URINE MICROSCOPIC-ADD ON - Abnormal; Notable for the following:    Bacteria, UA MANY (*)    All other components within normal limits  URINE CULTURE  I-STAT TROPOININ, ED    Imaging  Review No results found.   EKG Interpretation None      MDM   Final diagnoses:  Fall, initial encounter  Midline thoracic back pain  Acute UTI    Patient here for evaluation of back pain following an unwitnessed fall. UA is consistent with UTI, treating with Rocephin. There is no evidence of acute fracture dislocation related to fall. Patient does have intermittent hypoxia in the department, significantly so with ambulation with A 63% on Room Air after ambulating to the bathroom. Discussed with medicine regarding admission for UTI and hypoxia.    Quintella Reichert, MD 10/08/14 [phone]

## 2014-10-07 NOTE — ED Notes (Signed)
Per ems pt from [facility] unwitnessed fall, pt co neck and back pain. No loc. Pt is fully immobilized. No anticoagulant intake. Pt is normally ambulatory with walker. Hx dementia. Pt is alert and oriented to person and event.

## 2014-10-08 ENCOUNTER — Encounter (HOSPITAL_COMMUNITY): Payer: Self-pay | Admitting: *Deleted

## 2014-10-08 DIAGNOSIS — Z79899 Other long term (current) drug therapy: Secondary | ICD-10-CM | POA: Diagnosis not present

## 2014-10-08 DIAGNOSIS — G8929 Other chronic pain: Secondary | ICD-10-CM | POA: Diagnosis present

## 2014-10-08 DIAGNOSIS — F0280 Dementia in other diseases classified elsewhere without behavioral disturbance: Secondary | ICD-10-CM

## 2014-10-08 DIAGNOSIS — J9610 Chronic respiratory failure, unspecified whether with hypoxia or hypercapnia: Secondary | ICD-10-CM | POA: Diagnosis present

## 2014-10-08 DIAGNOSIS — R259 Unspecified abnormal involuntary movements: Secondary | ICD-10-CM | POA: Diagnosis not present

## 2014-10-08 DIAGNOSIS — F329 Major depressive disorder, single episode, unspecified: Secondary | ICD-10-CM | POA: Diagnosis present

## 2014-10-08 DIAGNOSIS — G309 Alzheimer's disease, unspecified: Secondary | ICD-10-CM

## 2014-10-08 DIAGNOSIS — R0602 Shortness of breath: Secondary | ICD-10-CM | POA: Diagnosis not present

## 2014-10-08 DIAGNOSIS — F419 Anxiety disorder, unspecified: Secondary | ICD-10-CM | POA: Diagnosis present

## 2014-10-08 DIAGNOSIS — M1990 Unspecified osteoarthritis, unspecified site: Secondary | ICD-10-CM | POA: Diagnosis present

## 2014-10-08 DIAGNOSIS — I10 Essential (primary) hypertension: Secondary | ICD-10-CM | POA: Diagnosis present

## 2014-10-08 DIAGNOSIS — R0902 Hypoxemia: Secondary | ICD-10-CM | POA: Diagnosis present

## 2014-10-08 DIAGNOSIS — N179 Acute kidney failure, unspecified: Secondary | ICD-10-CM | POA: Diagnosis not present

## 2014-10-08 DIAGNOSIS — F0390 Unspecified dementia without behavioral disturbance: Secondary | ICD-10-CM | POA: Diagnosis not present

## 2014-10-08 DIAGNOSIS — N390 Urinary tract infection, site not specified: Secondary | ICD-10-CM | POA: Diagnosis present

## 2014-10-08 DIAGNOSIS — E872 Acidosis: Secondary | ICD-10-CM | POA: Diagnosis present

## 2014-10-08 DIAGNOSIS — Z87891 Personal history of nicotine dependence: Secondary | ICD-10-CM | POA: Diagnosis not present

## 2014-10-08 DIAGNOSIS — J439 Emphysema, unspecified: Secondary | ICD-10-CM | POA: Diagnosis not present

## 2014-10-08 DIAGNOSIS — R0600 Dyspnea, unspecified: Secondary | ICD-10-CM | POA: Diagnosis not present

## 2014-10-08 LAB — COMPREHENSIVE METABOLIC PANEL
ALBUMIN: 4 g/dL (ref 3.5–5.2)
ALK PHOS: 58 U/L (ref 39–117)
ALT: 10 U/L (ref 0–35)
ANION GAP: 9 (ref 5–15)
AST: 19 U/L (ref 0–37)
BILIRUBIN TOTAL: 0.5 mg/dL (ref 0.3–1.2)
BUN: 27 mg/dL — AB (ref 6–23)
CALCIUM: 8.7 mg/dL (ref 8.4–10.5)
CO2: 30 mmol/L (ref 19–32)
CREATININE: 1.36 mg/dL — AB (ref 0.50–1.10)
Chloride: 105 mmol/L (ref 96–112)
GFR, EST AFRICAN AMERICAN: 39 mL/min — AB (ref >90)
GFR, EST NON AFRICAN AMERICAN: 34 mL/min — AB (ref >90)
Glucose, Bld: 88 mg/dL (ref 70–99)
Potassium: 4 mmol/L (ref 3.5–5.1)
Sodium: 144 mmol/L (ref 135–145)
TOTAL PROTEIN: 6.2 g/dL (ref 6.0–8.3)

## 2014-10-08 LAB — URINALYSIS, ROUTINE W REFLEX MICROSCOPIC
Bilirubin Urine: NEGATIVE
Glucose, UA: NEGATIVE mg/dL
Ketones, ur: NEGATIVE mg/dL
NITRITE: POSITIVE — AB
PH: 6.5 (ref 5.0–8.0)
Protein, ur: NEGATIVE mg/dL
Specific Gravity, Urine: 1.01 (ref 1.005–1.030)
Urobilinogen, UA: 0.2 mg/dL (ref 0.0–1.0)

## 2014-10-08 LAB — BLOOD GAS, ARTERIAL
Acid-Base Excess: 1.6 mmol/L (ref 0.0–2.0)
BICARBONATE: 27.9 meq/L — AB (ref 20.0–24.0)
O2 Content: 2 L/min
O2 Saturation: 97.8 %
PCO2 ART: 52.7 mmHg — AB (ref 35.0–45.0)
PO2 ART: 99.5 mmHg (ref 80.0–100.0)
Patient temperature: 98.1
TCO2: 24.9 mmol/L (ref 0–100)
pH, Arterial: 7.342 — ABNORMAL LOW (ref 7.350–7.450)

## 2014-10-08 LAB — CBC WITH DIFFERENTIAL/PLATELET
Basophils Absolute: 0 10*3/uL (ref 0.0–0.1)
Basophils Relative: 0 % (ref 0–1)
EOS ABS: 0.2 10*3/uL (ref 0.0–0.7)
Eosinophils Relative: 2 % (ref 0–5)
HEMATOCRIT: 46.8 % — AB (ref 36.0–46.0)
Hemoglobin: 14.3 g/dL (ref 12.0–15.0)
LYMPHS ABS: 1.6 10*3/uL (ref 0.7–4.0)
Lymphocytes Relative: 19 % (ref 12–46)
MCH: 28.5 pg (ref 26.0–34.0)
MCHC: 30.6 g/dL (ref 30.0–36.0)
MCV: 93.4 fL (ref 78.0–100.0)
MONOS PCT: 8 % (ref 3–12)
Monocytes Absolute: 0.7 10*3/uL (ref 0.1–1.0)
Neutro Abs: 6.2 10*3/uL (ref 1.7–7.7)
Neutrophils Relative %: 71 % (ref 43–77)
Platelets: 212 10*3/uL (ref 150–400)
RBC: 5.01 MIL/uL (ref 3.87–5.11)
RDW: 13.8 % (ref 11.5–15.5)
WBC: 8.7 10*3/uL (ref 4.0–10.5)

## 2014-10-08 LAB — BRAIN NATRIURETIC PEPTIDE: B Natriuretic Peptide: 34.1 pg/mL (ref 0.0–100.0)

## 2014-10-08 LAB — MRSA PCR SCREENING: MRSA by PCR: NEGATIVE

## 2014-10-08 LAB — TROPONIN I
Troponin I: <0.03 ng/mL (ref <0.031)
Troponin I: <0.03 ng/mL (ref <0.031)
Troponin I: <0.03 ng/mL (ref <0.031)

## 2014-10-08 LAB — URINE MICROSCOPIC-ADD ON

## 2014-10-08 LAB — D-DIMER, QUANTITATIVE (NOT AT ARMC)

## 2014-10-08 MED ORDER — HALOPERIDOL LACTATE 5 MG/ML IJ SOLN
1.0000 mg | Freq: Four times a day (QID) | INTRAMUSCULAR | Status: DC | PRN
  Administered 2014-10-08 (×2): 1 mg via INTRAVENOUS
  Filled 2014-10-08 (×2): qty 1

## 2014-10-08 MED ORDER — ACETAMINOPHEN 325 MG PO TABS: 650.0000 mg | ORAL_TABLET | Freq: Four times a day (QID) | ORAL | Status: DC | PRN

## 2014-10-08 MED ORDER — DEXTROSE 5 % IV SOLN
1.0000 g | Freq: Once | INTRAVENOUS | Status: AC
  Administered 2014-10-08: 1 g via INTRAVENOUS
  Filled 2014-10-08: qty 10

## 2014-10-08 MED ORDER — SODIUM CHLORIDE 0.9 % IJ SOLN
3.0000 mL | Freq: Two times a day (BID) | INTRAMUSCULAR | Status: DC
Start: 2014-10-08 — End: 2014-10-09
  Administered 2014-10-08 (×3): 3 mL via INTRAVENOUS

## 2014-10-08 MED ORDER — DEXTROSE 5 % IV SOLN
1.0000 g | Freq: Every day | INTRAVENOUS | Status: DC
  Administered 2014-10-08: 1 g via INTRAVENOUS
  Filled 2014-10-08 (×2): qty 10

## 2014-10-08 MED ORDER — ONDANSETRON HCL 4 MG/2ML IJ SOLN: 4.0000 mg | Freq: Four times a day (QID) | INTRAMUSCULAR | Status: DC | PRN

## 2014-10-08 MED ORDER — PAROXETINE HCL 20 MG PO TABS
40.0000 mg | ORAL_TABLET | Freq: Every day | ORAL | Status: DC
  Administered 2014-10-08 – 2014-10-09 (×2): 40 mg via ORAL
  Filled 2014-10-08 (×2): qty 2

## 2014-10-08 MED ORDER — TEMAZEPAM 7.5 MG PO CAPS
7.5000 mg | ORAL_CAPSULE | Freq: Every day | ORAL | Status: DC
  Administered 2014-10-08: 7.5 mg via ORAL
  Filled 2014-10-08: qty 1

## 2014-10-08 MED ORDER — POLYETHYLENE GLYCOL 3350 17 G PO PACK: 17.0000 g | PACK | Freq: Every day | ORAL | Status: DC | PRN

## 2014-10-08 MED ORDER — HYDROCODONE-ACETAMINOPHEN 5-325 MG PO TABS: 1.0000 | ORAL_TABLET | Freq: Three times a day (TID) | ORAL | Status: DC | PRN

## 2014-10-08 MED ORDER — HEPARIN SODIUM (PORCINE) 5000 UNIT/ML IJ SOLN
5000.0000 [IU] | Freq: Three times a day (TID) | INTRAMUSCULAR | Status: DC
Start: 2014-10-08 — End: 2014-10-09
  Administered 2014-10-08 – 2014-10-09 (×5): 5000 [IU] via SUBCUTANEOUS
  Filled 2014-10-08 (×7): qty 1

## 2014-10-08 MED ORDER — MELOXICAM 7.5 MG PO TABS
7.5000 mg | ORAL_TABLET | Freq: Every day | ORAL | Status: DC
  Administered 2014-10-08 – 2014-10-09 (×2): 7.5 mg via ORAL
  Filled 2014-10-08 (×3): qty 1

## 2014-10-08 MED ORDER — ONDANSETRON HCL 4 MG PO TABS: 4.0000 mg | ORAL_TABLET | Freq: Four times a day (QID) | ORAL | Status: DC | PRN

## 2014-10-08 MED ORDER — ALBUTEROL SULFATE (2.5 MG/3ML) 0.083% IN NEBU
5.0000 mg | INHALATION_SOLUTION | Freq: Once | RESPIRATORY_TRACT | Status: AC
  Administered 2014-10-08: 5 mg via RESPIRATORY_TRACT
  Filled 2014-10-08: qty 6

## 2014-10-08 MED ORDER — ACETAMINOPHEN 650 MG RE SUPP: 650.0000 mg | Freq: Four times a day (QID) | RECTAL | Status: DC | PRN

## 2014-10-08 NOTE — Discharge Instructions (Signed)
Laura [name] was seen in the Emergency Department for evaluation of injuries following a fall.  She complained of neck and back pain and images were performed that show evidence of COPD (emphysema) and a thyroid nodule.  She complained of shortness of breath with walking but none at rest.  Please get her rechecked by her family doctor this week.  Get her rechecked immediately if she has any new or concerning symptoms.

## 2014-10-08 NOTE — Progress Notes (Signed)
UR completed 

## 2014-10-08 NOTE — Progress Notes (Signed)
BP 143/73 mmHg  Pulse 73  Temp(Src) 97.4 F (36.3 C) (Oral)  Resp 16  SpO2 100% I agree with plan of admitting M.D., she has been saturating greater than 95% on room air her first set of cardiac enzymes is negative, she was started on empiric antibiotics for possible UTI. 2-D echo is pending.

## 2014-10-08 NOTE — H&P (Signed)
[facility] History and Physical  Patient: TYWANDA [name]  MRN: [id number]  DOB: October 09, 1923  DOS: the patient was seen and examined on 10/08/2014 PCP: Pcp Not In System  Chief Complaint: Unwitnessed fall  HPI: [name] is a 81 y.o. female with Past medical history of hypertension, Alzheimer's dementia, depression. The patient is presenting with an off and this fall. The history was obtained from documentation as the patient is unable to provide any significant history and nursing home personnel was not present when the fall happened. The patient reportedly had unwitnessed fall and was found on routine check. Patient initially complained of neck pain and back pain. EMS was called due to the fall. Evaluation was performed in the ED including CT scans of the head and the neck as well as x-ray of chest and spine were negative for any acute abnormality. Later on while attempting to ambulate the patient oxygenation was checked and she was found to be dropping her saturations to 69 on ambulation. Patient continues to remain hypoxic throughout the deep white fluids of oxygen. Therefore she was recommended to be admitted on the hospital for observation. No prior history of hypoxia reported. Patient has history of COPD does not take any medication reportedly.  The patient is coming from assisted living facility memory care unit And at her baseline independent for most of her ADL.  Review of Systems: as mentioned in the history of present illness.  A Comprehensive review of the other systems is negative.  Past Medical History  Diagnosis Date  . Hypertension   . Arthritis   . Alzheimer's dementia   . Anxiety   . Depression    Past Surgical History  Procedure Laterality Date  . Shoulder surgery      Left   . Loop recorder implant N/A 06/22/2012    Procedure: LOOP RECORDER IMPLANT;  Surgeon: Sanda Klein, MD;  Location: [facility];  Service: Cardiovascular;   Laterality: N/A;   Social History:  reports that she has quit smoking. She does not have any smokeless tobacco history on file. She reports that she does not drink alcohol or use illicit drugs.  Allergies  Allergen Reactions  . Codeine     Makes her ill    History reviewed. No pertinent family history.  Prior to Admission medications   Medication Sig Start Date End Date Taking? Authorizing Provider  cholecalciferol (VITAMIN D) 1000 UNITS tablet Take 1,000 Units by mouth daily with breakfast.    Yes Historical Provider, MD  meloxicam (MOBIC) 7.5 MG tablet Take 7.5 mg by mouth daily with breakfast.   Yes Historical Provider, MD  NUTRITIONAL SUPPLEMENT LIQD Take 1 Bottle by mouth 3 (three) times daily.   Yes Historical Provider, MD  PARoxetine (PAXIL) 40 MG tablet Take 40 mg by mouth every morning.   Yes Historical Provider, MD  temazepam (RESTORIL) 7.5 MG capsule Take 7.5 mg by mouth at bedtime. 03/09/14  Yes Historical Provider, MD  acetaminophen (MAPAP) 500 MG tablet Take 500 mg by mouth every 6 (six) hours as needed for mild pain, fever or headache.     Historical Provider, MD  alum & mag hydroxide-simeth (MI-ACID MAXIMUM STRENGTH) 400-400-40 MG/5ML suspension Take 30 mLs by mouth every 6 (six) hours as needed for indigestion.     Historical Provider, MD  Carbomer Gel Base (HYDROGEL) GEL Apply 1 application topically daily as needed (for rash). Apply to face    Historical Provider, MD  cephALEXin (KEFLEX) 500 MG capsule  Take 1 capsule (500 mg total) by mouth 2 (two) times daily. Patient not taking: Reported on 10/07/2014 06/20/14   Quintella Reichert, MD  guaifenesin (Q-TUSSIN) 100 MG/5ML syrup Take 200 mg by mouth every 6 (six) hours as needed for cough.     Historical Provider, MD  HYDROcodone-acetaminophen (NORCO/VICODIN) 5-325 MG per tablet Take 1 tablet by mouth 3 (three) times daily as needed for moderate pain.    Historical Provider, MD  loperamide (IMODIUM) 2 MG capsule Take 2 mg by mouth  as needed for diarrhea or loose stools. NOT TO EXCEED 8 DOSES IN 24 HR    Historical Provider, MD  magnesium hydroxide (MILK OF MAGNESIA) 400 MG/5ML suspension Take 30 mLs by mouth daily as needed for mild constipation.     Historical Provider, MD  Naphazoline-Glycerin-Zinc Sulf (CLEAR EYES MAXIMUM ITCHY EYE OP) Place 1-2 drops into both eyes 3 (three) times daily as needed (for itching and redness). Wait 3 to 5 minutes between each drop    Historical Provider, MD  Neomycin-Bacitracin-Polymyxin (HCA TRIPLE ANTIBIOTIC OINTMENT EX) Apply 1 application topically as needed (for wound care).     Historical Provider, MD  polyethylene glycol (MIRALAX / GLYCOLAX) packet Take 17 g by mouth daily as needed for mild constipation.     Historical Provider, MD    Physical Exam: Filed Vitals:   10/08/14 0145 10/08/14 0200 10/08/14 0300 10/08/14 0334  BP:  139/111 121/46 149/57  Pulse: 79 80 78 76  Temp:    97.6 F (36.4 C)  TempSrc:    Oral  Resp: 18 18 11 15   SpO2: 93% 95% 94% 94%    General: Alert, Awake and Oriented to Time, Place and Person. Appear in mild distress Eyes: PERRL ENT: Oral Mucosa clear moist. Neck: no JVD Cardiovascular: S1 and S2 Present, no Murmur, Peripheral Pulses Present Respiratory: Bilateral Air entry equal and Decreased, Clear to Auscultation, noCrackles, no wheezes Abdomen: Bowel Sound present, Soft and non tender Skin: no Rash Extremities: no Pedal edema, no calf tenderness Neurologic: Grossly no focal neuro deficit.  Labs on Admission:  CBC:  Recent Labs Lab 10/07/14 2158  WBC 8.2  NEUTROABS 5.0  HGB 15.0  HCT 48.5*  MCV 92.0  PLT 200    CMP     Component Value Date/Time   NA 141 10/07/2014 2158   K 4.6 10/07/2014 2158   CL 106 10/07/2014 2158   CO2 28 10/07/2014 2158   GLUCOSE 92 10/07/2014 2158   BUN 31* 10/07/2014 2158   CREATININE 1.35* 10/07/2014 2158   CALCIUM 9.4 10/07/2014 2158   PROT 6.3 01/17/2013 1050   ALBUMIN 3.7 01/17/2013 1050    AST 13 01/17/2013 1050   ALT 7 01/17/2013 1050   ALKPHOS 72 01/17/2013 1050   BILITOT 0.4 01/17/2013 1050   GFRNONAA 34* 10/07/2014 2158   GFRAA 39* 10/07/2014 2158    No results for input(s): LIPASE, AMYLASE in the last 168 hours.   Recent Labs Lab 10/08/14 0255  TROPONINI <0.03   BNP (last 3 results)  Recent Labs  10/08/14 0255  BNP 34.1    ProBNP (last 3 results) No results for input(s): PROBNP in the last 8760 hours.   Radiological Exams on Admission: Dg Thoracic Spine 2 View  10/07/2014   CLINICAL DATA:  Status post fall at nursing home, with upper back pain. Initial encounter.  EXAM: THORACIC SPINE - 2 VIEW  COMPARISON:  Chest radiograph performed 06/20/2014  FINDINGS: There is no evidence of acute  fracture or subluxation. Chronic compression deformities are noted at vertebral bodies T8, T9 and T12, with associated changes of vertebroplasty. Vertebral bodies demonstrate normal height and alignment. Intervertebral disc spaces are preserved.  The visualized portions of both lungs are clear. The mediastinum is unremarkable in appearance. The patient's left shoulder arthroplasty is grossly unremarkable in appearance.  IMPRESSION: 1. No evidence of acute fracture or subluxation along the thoracic spine. 2. Chronic compression deformities at T8, T9 and T12, with associated changes of vertebroplasty.   Electronically Signed   By: Garald Balding M.D.   On: 10/07/2014 22:51   Ct Head Wo Contrast  10/07/2014   CLINICAL DATA:  Status post fall. Concern for head or cervical spine injury. Initial encounter.  EXAM: CT HEAD WITHOUT CONTRAST  CT CERVICAL SPINE WITHOUT CONTRAST  TECHNIQUE: Multidetector CT imaging of the head and cervical spine was performed following the standard protocol without intravenous contrast. Multiplanar CT image reconstructions of the cervical spine were also generated.  COMPARISON:  CT of the head and cervical spine performed 06/20/2014  FINDINGS: CT HEAD FINDINGS   There is no evidence of acute infarction, mass lesion, or intra- or extra-axial hemorrhage on CT.  Prominence of the ventricles and sulci reflects moderate cortical volume loss. Scattered periventricular and subcortical white matter change likely reflects small vessel ischemic microangiopathy. Mild cerebellar atrophy is noted.  The brainstem and fourth ventricle are within normal limits. The basal ganglia are unremarkable in appearance. The cerebral hemispheres demonstrate grossly normal gray-white differentiation. No mass effect or midline shift is seen.  There is no evidence of fracture; visualized osseous structures are unremarkable in appearance. The orbits are within normal limits. The paranasal sinuses and mastoid air cells are well-aerated. No significant soft tissue abnormalities are seen.  CT CERVICAL SPINE FINDINGS  There is no evidence of fracture or subluxation. Vertebral bodies demonstrate normal height and alignment. Mild disc space narrowing is noted at C5-C6, with small anterior and posterior disc osteophyte complexes. Prevertebral soft tissues are within normal limits.  A large 3.0 cm nodule is noted at the right thyroid lobe. This appears to have been biopsied in 2003. Emphysematous change is noted at the lung apices. Mild calcification is seen at the carotid bifurcations bilaterally.  IMPRESSION: 1. No evidence of traumatic intracranial injury or fracture. 2. No evidence of fracture or subluxation along the cervical spine. 3. Moderate cortical volume loss and scattered small vessel ischemic microangiopathy. 4. Emphysematous change noted at the lung apices. 5. Mild calcification at the carotid bifurcations bilaterally. 6. Minimal degenerative change at the lower cervical spine. 7. 3.0 cm right thyroid nodule appears to have been biopsied in 2003. Would correlate with prior biopsy results.   Electronically Signed   By: Garald Balding M.D.   On: 10/07/2014 23:24   Ct Cervical Spine Wo  Contrast  10/07/2014   CLINICAL DATA:  Status post fall. Concern for head or cervical spine injury. Initial encounter.  EXAM: CT HEAD WITHOUT CONTRAST  CT CERVICAL SPINE WITHOUT CONTRAST  TECHNIQUE: Multidetector CT imaging of the head and cervical spine was performed following the standard protocol without intravenous contrast. Multiplanar CT image reconstructions of the cervical spine were also generated.  COMPARISON:  CT of the head and cervical spine performed 06/20/2014  FINDINGS: CT HEAD FINDINGS  There is no evidence of acute infarction, mass lesion, or intra- or extra-axial hemorrhage on CT.  Prominence of the ventricles and sulci reflects moderate cortical volume loss. Scattered periventricular and subcortical white matter  change likely reflects small vessel ischemic microangiopathy. Mild cerebellar atrophy is noted.  The brainstem and fourth ventricle are within normal limits. The basal ganglia are unremarkable in appearance. The cerebral hemispheres demonstrate grossly normal gray-white differentiation. No mass effect or midline shift is seen.  There is no evidence of fracture; visualized osseous structures are unremarkable in appearance. The orbits are within normal limits. The paranasal sinuses and mastoid air cells are well-aerated. No significant soft tissue abnormalities are seen.  CT CERVICAL SPINE FINDINGS  There is no evidence of fracture or subluxation. Vertebral bodies demonstrate normal height and alignment. Mild disc space narrowing is noted at C5-C6, with small anterior and posterior disc osteophyte complexes. Prevertebral soft tissues are within normal limits.  A large 3.0 cm nodule is noted at the right thyroid lobe. This appears to have been biopsied in 2003. Emphysematous change is noted at the lung apices. Mild calcification is seen at the carotid bifurcations bilaterally.  IMPRESSION: 1. No evidence of traumatic intracranial injury or fracture. 2. No evidence of fracture or subluxation  along the cervical spine. 3. Moderate cortical volume loss and scattered small vessel ischemic microangiopathy. 4. Emphysematous change noted at the lung apices. 5. Mild calcification at the carotid bifurcations bilaterally. 6. Minimal degenerative change at the lower cervical spine. 7. 3.0 cm right thyroid nodule appears to have been biopsied in 2003. Would correlate with prior biopsy results.   Electronically Signed   By: Garald Balding M.D.   On: 10/07/2014 23:24   Dg Chest Port 1 View  10/08/2014   CLINICAL DATA:  Hypoxia  EXAM: PORTABLE CHEST - 1 VIEW  COMPARISON:  06/20/2014  FINDINGS: There are low lung volumes. There is extensive emphysematous and fibrotic appearing change without evidence of a superimposed acute infiltrate or congestive heart failure. No large effusion. Heart size is normal and unchanged. Hilar and mediastinal contours are unchanged.  IMPRESSION: COPD.  No acute cardiopulmonary findings.   Electronically Signed   By: Andreas Newport M.D.   On: 10/08/2014 00:13   EKG: Independently reviewed. normal sinus rhythm, nonspecific ST and T waves changes.  Assessment/Plan Principal Problem:   Hypoxia Active Problems:   Shortness of breath   Alzheimer's dementia   Hypertension   UTI (urinary tract infection)   1. Hypoxia The patient is presenting with complaining off all unwitnessed fall. She was found to be hypoxic. ABG shows chronic compensated respiratory acidosis. Chest x-ray shows no acute finding. Perivascular examination does not show any evidence of acute abnormality as well including an EKG d-dimer troponin as well as BNP. But patient continues to remain hypoxic with saturations dropping down to 87 on room air on my evaluation. With this the patient is currently admitted in the hospital. I would use incentive spirometry. Monitor on telemetry. 2-D echo cardiac exam in the morning. Serial troponin.  2. Alzheimer dementia. Does not appear to have any behavioral  disturbances. Continue close monitoring.  3. UTI. Treat her with ceftriaxone.  4. Fall. Patient presented with a fall. Etiology currently unclear as it was not witnessed. Possibility of hypoxia causing the fall possibility of UTI causing the fall cannot be ruled out with generalized weakness. PTOT evaluation.  Advance goals of care discussion: Full code as per my discussion with patient's nursing home.  DVT Prophylaxis: subcutaneous Heparin Nutrition: Regular diet  Disposition: Admitted to observation in telemetry unit.  Author: Berle Mull, MD [facility] Pager: [phone] 10/08/2014, 5:34 AM    If 7PM-7AM, please contact night-coverage [email or web address]  Password [email or web address]

## 2014-10-08 NOTE — ED Notes (Signed)
Pt ambulated to restroom off O 2, When pt came back to room and was hooked back up to pulse OX she was sating 65% on room air with a great rhythm. Pt was asked if she was SOB and pt agreed she was a little short of breath. Pt was not breathing heavily.Pt was then placed on 4 litters nasal cannula. Pt sat's began to increase slowly.

## 2014-10-08 NOTE — Progress Notes (Signed)
Clinical Social Work Department BRIEF PSYCHOSOCIAL ASSESSMENT 10/08/2014  Patient:  Laura [name],Laura [name]     Account Number:  [id number]     Admit date:  10/07/2014  Clinical Social Worker:  Glorious Peach, [place]  Date/Time:  10/08/2014 03:46 PM  Referred by:  Physician  Date Referred:  10/08/2014 Referred for  ALF Placement   Other Referral:   Interview type:  Patient Other interview type:   and friend Laura [name] via phone    PSYCHOSOCIAL DATA Living Status:  FACILITY Admitted from facility:  [facility]  Level of care:  Assisted Living Primary support name:  Laura [name] Primary support relationship to patient:  FRIEND Degree of support available:   Adequate    CURRENT CONCERNS Current Concerns  Post-Acute Placement   Other Concerns:    SOCIAL WORK ASSESSMENT / PLAN CSW received referral, pt from [facility]. CSW to follow pt for DC planning.   Assessment/plan status:  Information/Referral to [employer] Other assessment/ plan:   Information/referral to community resources:   CSW completed FL2, contacted [facility] to inquire about pt being able to return when medically stable and ready for DC.    PATIENT'S/FAMILY'S RESPONSE TO PLAN OF CARE: CSW met with pt at bedside, introduced self and explained role. Pt is not oriented to situation and reports of not knowing the year or date or where she is at this moment. CSW contacted friend that is listed on [place], Laura [name], verifying that pt is from [facility] confirms that pt is a resident and plans to return when medically stable.    CSW then contacted [facility] and spoke with a Nurse tech who states that pt can definitely return when medically stable and ready for DC. CSW inquired about any needed clinicals that facility may need. Nurse tech reports that she does not know of any but if there is, they will make  CSW aware when pt is DC. CSW will continue to follow for further DC planning.       Glorious Peach BSW Intern

## 2014-10-08 NOTE — Progress Notes (Signed)
ANTIBIOTIC CONSULT NOTE - INITIAL  Pharmacy Consult for ceftriaxone Indication: UTI  Allergies  Allergen Reactions  . Codeine     Makes her ill    Patient Measurements:   Adjusted Body Weight:   Vital Signs: Temp: 98.1 F (36.7 C) (03/21 2041) Temp Source: Oral (03/21 2041) BP: 139/111 mmHg (03/22 0200) Pulse Rate: 80 (03/22 0200) Intake/Output from previous day:   Intake/Output from this shift:    Labs:  Recent Labs  10/07/14 2158  WBC 8.2  HGB 15.0  PLT 200  CREATININE 1.35*   CrCl cannot be calculated (Unknown ideal weight.). No results for input(s): VANCOTROUGH, VANCOPEAK, VANCORANDOM, GENTTROUGH, GENTPEAK, GENTRANDOM, TOBRATROUGH, TOBRAPEAK, TOBRARND, AMIKACINPEAK, AMIKACINTROU, AMIKACIN in the last 72 hours.   Microbiology: No results found for this or any previous visit (from the past 720 hour(s)).  Medical History: Past Medical History  Diagnosis Date  . Hypertension   . Arthritis   . Alzheimer's dementia   . Anxiety   . Depression     Medications:  Anti-infectives    Start     Dose/Rate Route Frequency Ordered Stop   10/08/14 2200  cefTRIAXone (ROCEPHIN) 1 g in dextrose 5 % 50 mL IVPB     1 g 100 mL/hr over 30 Minutes Intravenous Daily at bedtime 10/08/14 0309     10/08/14 0115  cefTRIAXone (ROCEPHIN) 1 g in dextrose 5 % 50 mL IVPB     1 g 100 mL/hr over 30 Minutes Intravenous  Once 10/08/14 0101 10/08/14 0151     Assessment: Patient with UTI.  First dose of antibiotics already given.  Goal of Therapy:  Rocephin based on manufacturer dosing recommendations.      Plan: Ceftriaxone 1gm iv q24hr Follow up culture results  Nani Skillern [place]ford 10/08/2014,3:09 AM

## 2014-10-09 DIAGNOSIS — N179 Acute kidney failure, unspecified: Secondary | ICD-10-CM | POA: Diagnosis present

## 2014-10-09 DIAGNOSIS — J439 Emphysema, unspecified: Secondary | ICD-10-CM | POA: Diagnosis present

## 2014-10-09 DIAGNOSIS — R0600 Dyspnea, unspecified: Secondary | ICD-10-CM

## 2014-10-09 MED ORDER — CEPHALEXIN 500 MG PO CAPS: 500.0000 mg | ORAL_CAPSULE | Freq: Two times a day (BID) | ORAL | Status: DC

## 2014-10-09 MED ORDER — HYDROCODONE-ACETAMINOPHEN 5-325 MG PO TABS: 1.0000 | ORAL_TABLET | Freq: Three times a day (TID) | ORAL | Status: DC

## 2014-10-09 MED ORDER — ALBUTEROL SULFATE HFA 108 (90 BASE) MCG/ACT IN AERS: 2.0000 | INHALATION_SPRAY | Freq: Four times a day (QID) | RESPIRATORY_TRACT | Status: DC | PRN

## 2014-10-09 NOTE — Discharge Summary (Addendum)
Physician Discharge Summary  Laura [name] QPR:[id number] DOB: 05/07/1924 DOA: 10/07/2014  PCP: Sande Brothers, MD  Admit date: 10/07/2014 Discharge date: 10/09/2014  Time spent: 25 minutes  Recommendations for Outpatient Follow-up:  1. discharge back to [facility] memory care unit with HHPT abd RN and on 2L via [place] continuously 2. pls  follow-up results of urine culture, , and repeat renal  function in 1 week as outpt. 3. Patient will complete antibiotic course on 3/28.  Discharge Diagnoses:  Principal Problem:   Hypoxia  Active Problems:   Shortness of breath   Alzheimer's dementia   Hypertension   UTI (urinary tract infection)   Acute kidney injury   COPD with emphysema   Discharge Condition:  Fair  Diet recommendation:  Regular  There were no vitals filed for this visit.  History of present illness:  81 year old female with history of severe Alzheimer's dementia, depression and hypertension, history of tobacco, [facility] was found on the floor with an unwitnessed fall. Patient was brought to the ED by EMS. In the ED CT scan of the head and neck, x-ray of the chest and spine were negative for acute abnormality. However on attempted to ambulate the patient her oxygen saturation dropped to 69% on room air. Since patient was persistently hypoxic she was admitted to medical floor for further workup.  Hospital Course:  Hypoxia Possible etiology could be underlying COPD with chronic respiratory failure. Patient not in any distress. No signs of volume overload. BNP and d-dimer were negative. Chest x-ray showing COPD with emphysematous changes without any infiltrate or fluid overload. -Patient has been maintaining O2 sat on 2 L via nasal cannula while the saturation drops to 80% on room air. -2-D echo shows low normal EF of 50-55% and grade 1 diastolic dysfunction.  -Arrange for continuous home O2, 2 L via nasal cannula upon discharge. I have  ordered for albuterol inhaler for as needed use.  Severe Alzheimer's dementia Patient reportedly was very confused overnight. Has been stable this morning.  UTI Urine culture growing gram-negative rods. Being treated with IV Rocephin in the hospital. [facility] discharge her 5 day course of oral Keflex. Follow-up urine culture as outpatient.   Fall Unwitnessed with unclear etiology. Home health PT recommended by physical therapy on evaluation.  Depression Continue Paxil  Chronic knee pain Patient on scheduled Vicodin as outpatient.  Acute kidney injury No baseline renal function available in the system. Follow-up with repeat lab in 1 week  Patient is clinically stable to be discharged back to memory care unit.  Procedures:  2-D echo  Consultations:  None  Discharge Exam: Filed Vitals:   10/08/14 1408  BP: 146/72  Pulse: 91  Temp: 98.7 F (37.1 C)  Resp: 18    General: Elderly female in no acute distress HEENT: No pallor, moist oral mucosa, no JVD, supple neck Chest: Clear to auscultation bilaterally, bladder sounds CVS: Normal S1 and S2, no murmurs rub or gallop GI: Soft, nondistended, nontender, bowel sounds present Extremities: Warm, no edema CNS: AAO x0 (oriented to self only)   Discharge Instructions    Current Discharge Medication List    START taking these medications   Details  albuterol (PROVENTIL HFA;VENTOLIN HFA) 108 (90 BASE) MCG/ACT inhaler Inhale 2 puffs into the lungs every 6 (six) hours as needed for wheezing or shortness of breath. Qty: 1 Inhaler, Refills: 2      CONTINUE these medications which have CHANGED   Details  cephALEXin (  KEFLEX) 500 MG capsule Take 1 capsule (500 mg total) by mouth 2 (two) times daily. Qty: 10 capsule, Refills: 0    HYDROcodone-acetaminophen (NORCO/VICODIN) 5-325 MG per tablet Take 1 tablet by mouth 3 (three) times daily. Qty: 30 tablet, Refills: 0      CONTINUE these medications which have NOT CHANGED    Details  cholecalciferol (VITAMIN D) 1000 UNITS tablet Take 1,000 Units by mouth daily with breakfast.     meloxicam (MOBIC) 7.5 MG tablet Take 7.5 mg by mouth daily with breakfast.    NUTRITIONAL SUPPLEMENT LIQD Take 1 Bottle by mouth 3 (three) times daily.    PARoxetine (PAXIL) 40 MG tablet Take 40 mg by mouth every morning.    temazepam (RESTORIL) 7.5 MG capsule Take 7.5 mg by mouth at bedtime.    acetaminophen (MAPAP) 500 MG tablet Take 500 mg by mouth every 6 (six) hours as needed for mild pain, fever or headache.     alum & mag hydroxide-simeth (MI-ACID MAXIMUM STRENGTH) 400-400-40 MG/5ML suspension Take 30 mLs by mouth every 6 (six) hours as needed for indigestion.     Carbomer Gel Base (HYDROGEL) GEL Apply 1 application topically daily as needed (for rash). Apply to face    guaifenesin (Q-TUSSIN) 100 MG/5ML syrup Take 200 mg by mouth every 6 (six) hours as needed for cough.     loperamide (IMODIUM) 2 MG capsule Take 2 mg by mouth as needed for diarrhea or loose stools. NOT TO EXCEED 8 DOSES IN 24 HR    magnesium hydroxide (MILK OF MAGNESIA) 400 MG/5ML suspension Take 30 mLs by mouth daily as needed for mild constipation.     Naphazoline-Glycerin-Zinc Sulf (CLEAR EYES MAXIMUM ITCHY EYE OP) Place 1-2 drops into both eyes 3 (three) times daily as needed (for itching and redness). Wait 3 to 5 minutes between each drop    Neomycin-Bacitracin-Polymyxin (HCA TRIPLE ANTIBIOTIC OINTMENT EX) Apply 1 application topically as needed (for wound care).     polyethylene glycol (MIRALAX / GLYCOLAX) packet Take 17 g by mouth daily as needed for mild constipation.        Allergies  Allergen Reactions  . Codeine     Makes her ill   Follow-up Information    Follow up with [facility], MD In 1 week.   Specialty:  Internal Medicine       The results of significant diagnostics from this hospitalization (including imaging, microbiology, ancillary and laboratory) are listed below for  reference.    Significant Diagnostic Studies: Dg Thoracic Spine 2 View  10/07/2014   CLINICAL DATA:  Status post fall at nursing home, with upper back pain. Initial encounter.  EXAM: THORACIC SPINE - 2 VIEW  COMPARISON:  Chest radiograph performed 06/20/2014  FINDINGS: There is no evidence of acute fracture or subluxation. Chronic compression deformities are noted at vertebral bodies T8, T9 and T12, with associated changes of vertebroplasty. Vertebral bodies demonstrate normal height and alignment. Intervertebral disc spaces are preserved.  The visualized portions of both lungs are clear. The mediastinum is unremarkable in appearance. The patient's left shoulder arthroplasty is grossly unremarkable in appearance.  IMPRESSION: 1. No evidence of acute fracture or subluxation along the thoracic spine. 2. Chronic compression deformities at T8, T9 and T12, with associated changes of vertebroplasty.   Electronically Signed   By: Garald Balding M.D.   On: 10/07/2014 22:51   Ct Head Wo Contrast  10/07/2014   CLINICAL DATA:  Status post fall. Concern for head or cervical spine  injury. Initial encounter.  EXAM: CT HEAD WITHOUT CONTRAST  CT CERVICAL SPINE WITHOUT CONTRAST  TECHNIQUE: Multidetector CT imaging of the head and cervical spine was performed following the standard protocol without intravenous contrast. Multiplanar CT image reconstructions of the cervical spine were also generated.  COMPARISON:  CT of the head and cervical spine performed 06/20/2014  FINDINGS: CT HEAD FINDINGS  There is no evidence of acute infarction, mass lesion, or intra- or extra-axial hemorrhage on CT.  Prominence of the ventricles and sulci reflects moderate cortical volume loss. Scattered periventricular and subcortical white matter change likely reflects small vessel ischemic microangiopathy. Mild cerebellar atrophy is noted.  The brainstem and fourth ventricle are within normal limits. The basal ganglia are unremarkable in appearance.  The cerebral hemispheres demonstrate grossly normal gray-white differentiation. No mass effect or midline shift is seen.  There is no evidence of fracture; visualized osseous structures are unremarkable in appearance. The orbits are within normal limits. The paranasal sinuses and mastoid air cells are well-aerated. No significant soft tissue abnormalities are seen.  CT CERVICAL SPINE FINDINGS  There is no evidence of fracture or subluxation. Vertebral bodies demonstrate normal height and alignment. Mild disc space narrowing is noted at C5-C6, with small anterior and posterior disc osteophyte complexes. Prevertebral soft tissues are within normal limits.  A large 3.0 cm nodule is noted at the right thyroid lobe. This appears to have been biopsied in 2003. Emphysematous change is noted at the lung apices. Mild calcification is seen at the carotid bifurcations bilaterally.  IMPRESSION: 1. No evidence of traumatic intracranial injury or fracture. 2. No evidence of fracture or subluxation along the cervical spine. 3. Moderate cortical volume loss and scattered small vessel ischemic microangiopathy. 4. Emphysematous change noted at the lung apices. 5. Mild calcification at the carotid bifurcations bilaterally. 6. Minimal degenerative change at the lower cervical spine. 7. 3.0 cm right thyroid nodule appears to have been biopsied in 2003. Would correlate with prior biopsy results.   Electronically Signed   By: Garald Balding M.D.   On: 10/07/2014 23:24   Ct Cervical Spine Wo Contrast  10/07/2014   CLINICAL DATA:  Status post fall. Concern for head or cervical spine injury. Initial encounter.  EXAM: CT HEAD WITHOUT CONTRAST  CT CERVICAL SPINE WITHOUT CONTRAST  TECHNIQUE: Multidetector CT imaging of the head and cervical spine was performed following the standard protocol without intravenous contrast. Multiplanar CT image reconstructions of the cervical spine were also generated.  COMPARISON:  CT of the head and cervical  spine performed 06/20/2014  FINDINGS: CT HEAD FINDINGS  There is no evidence of acute infarction, mass lesion, or intra- or extra-axial hemorrhage on CT.  Prominence of the ventricles and sulci reflects moderate cortical volume loss. Scattered periventricular and subcortical white matter change likely reflects small vessel ischemic microangiopathy. Mild cerebellar atrophy is noted.  The brainstem and fourth ventricle are within normal limits. The basal ganglia are unremarkable in appearance. The cerebral hemispheres demonstrate grossly normal gray-white differentiation. No mass effect or midline shift is seen.  There is no evidence of fracture; visualized osseous structures are unremarkable in appearance. The orbits are within normal limits. The paranasal sinuses and mastoid air cells are well-aerated. No significant soft tissue abnormalities are seen.  CT CERVICAL SPINE FINDINGS  There is no evidence of fracture or subluxation. Vertebral bodies demonstrate normal height and alignment. Mild disc space narrowing is noted at C5-C6, with small anterior and posterior disc osteophyte complexes. Prevertebral soft tissues are within  normal limits.  A large 3.0 cm nodule is noted at the right thyroid lobe. This appears to have been biopsied in 2003. Emphysematous change is noted at the lung apices. Mild calcification is seen at the carotid bifurcations bilaterally.  IMPRESSION: 1. No evidence of traumatic intracranial injury or fracture. 2. No evidence of fracture or subluxation along the cervical spine. 3. Moderate cortical volume loss and scattered small vessel ischemic microangiopathy. 4. Emphysematous change noted at the lung apices. 5. Mild calcification at the carotid bifurcations bilaterally. 6. Minimal degenerative change at the lower cervical spine. 7. 3.0 cm right thyroid nodule appears to have been biopsied in 2003. Would correlate with prior biopsy results.   Electronically Signed   By: Garald Balding M.D.   On:  10/07/2014 23:24   Dg Chest Port 1 View  10/08/2014   CLINICAL DATA:  Hypoxia  EXAM: PORTABLE CHEST - 1 VIEW  COMPARISON:  06/20/2014  FINDINGS: There are low lung volumes. There is extensive emphysematous and fibrotic appearing change without evidence of a superimposed acute infiltrate or congestive heart failure. No large effusion. Heart size is normal and unchanged. Hilar and mediastinal contours are unchanged.  IMPRESSION: COPD.  No acute cardiopulmonary findings.   Electronically Signed   By: Andreas Newport M.D.   On: 10/08/2014 00:13    Microbiology: Recent Results (from the past 240 hour(s))  Urine culture     Status: None (Preliminary result)   Collection Time: 10/07/14 11:32 PM  Result Value Ref Range Status   Specimen Description URINE, CLEAN CATCH  Final   Special Requests NONE  Final   Colony Count   Final    >=100,000 COLONIES/ML Performed at [facility]    Culture   Final    [place] Performed at [facility]    Report Status PENDING  Incomplete  MRSA PCR Screening     Status: None   Collection Time: 10/08/14  3:39 AM  Result Value Ref Range Status   MRSA by PCR NEGATIVE NEGATIVE Final    Comment:        The GeneXpert MRSA Assay (FDA approved for NASAL specimens only), is one component of a comprehensive MRSA colonization surveillance program. It is not intended to diagnose MRSA infection nor to guide or monitor treatment for MRSA infections.      Labs: Basic Metabolic Panel:  Recent Labs Lab 10/07/14 2158 10/08/14 0458  NA 141 144  K 4.6 4.0  CL 106 105  CO2 28 30  GLUCOSE 92 88  BUN 31* 27*  CREATININE 1.35* 1.36*  CALCIUM 9.4 8.7   Liver Function Tests:  Recent Labs Lab 10/08/14 0458  AST 19  ALT 10  ALKPHOS 58  BILITOT 0.5  PROT 6.2  ALBUMIN 4.0   No results for input(s): LIPASE, AMYLASE in the last 168 hours. No results for input(s): AMMONIA in the last 168 hours. CBC:  Recent Labs Lab  10/07/14 2158 10/08/14 0458  WBC 8.2 8.7  NEUTROABS 5.0 6.2  HGB 15.0 14.3  HCT 48.5* 46.8*  MCV 92.0 93.4  PLT 200 212   Cardiac Enzymes:  Recent Labs Lab 10/08/14 0255 10/08/14 1000 10/08/14 1448  TROPONINI <0.03 <0.03 <0.03   BNP: BNP (last 3 results)  Recent Labs  10/08/14 0255  BNP 34.1    ProBNP (last 3 results) No results for input(s): PROBNP in the last 8760 hours.  CBG: No results for input(s): GLUCAP in the last 168 hours.  SignedLouellen Molder  [facility] 10/09/2014, 1:50 PM

## 2014-10-09 NOTE — Evaluation (Signed)
Physical Therapy Evaluation Patient Details Name: Laura [name] MRN: [id number] DOB: 1923/12/01 Today's Date: 10/09/2014   History of Present Illness  81 y/o admitted after having unwitnessed fall in memory unit ALF.  PMH includes dementia.  Clinical Impression  Pt admitted with above diagnosis. Pt currently with functional limitations due to the deficits listed below (see PT Problem List). Pt will benefit from skilled PT to increase their independence and safety with mobility to allow discharge to the venue listed below.  Pt ambulated 26' with RW with min/guard to S level with o2 sat dropping to 84% on room air.  Feel pt can return to memory unit ALF as long as staff can provide A with OOB mobility/gait initially with HHPT following up.     Follow Up Recommendations Home health PT;Supervision for mobility/OOB;Supervision/Assistance - 24 hour    Equipment Recommendations  None recommended by PT    Recommendations for Other Services       Precautions / Restrictions Precautions Precautions: Fall      Mobility  Bed Mobility Overal bed mobility: Modified Independent             General bed mobility comments: HOB elevated  Transfers Overall transfer level: Needs assistance Equipment used: Rolling walker (2 wheeled) Transfers: Sit to/from Stand Sit to Stand: Supervision         General transfer comment: cues for hand placement. Some dicsomfort with pushing up due to IV site.  Ambulation/Gait Ambulation/Gait assistance: Min guard;Supervision Ambulation Distance (Feet): 120 Feet Assistive device: Rolling walker (2 wheeled) Gait Pattern/deviations: Step-through pattern     General Gait Details: Pt with some difficulty maneuvering RW around obstacles.  Could self correct, but was not able to anticipate until right upon obstacle. o2 84% after gait.  O2 applied at 2L/min and quickly increased to 96%. Nursing informed.  Stairs            Wheelchair Mobility     Modified Rankin (Stroke Patients Only)       Balance Overall balance assessment: Needs assistance Sitting-balance support: Feet supported Sitting balance-Leahy Scale: Good       Standing balance-Leahy Scale: Poor Standing balance comment: requires RW                             Pertinent Vitals/Pain Pain Assessment: No/denies pain    Home Living Family/patient expects to be discharged to:: Assisted living               Home Equipment: Walker - 2 wheels      Prior Function           Comments: Per notes, pt ambulated with RW     Hand Dominance        Extremity/Trunk Assessment   Upper Extremity Assessment: Overall WFL for tasks assessed           Lower Extremity Assessment: Overall WFL for tasks assessed;Generalized weakness         Communication      Cognition Arousal/Alertness: Awake/alert Behavior During Therapy: WFL for tasks assessed/performed Overall Cognitive Status: History of cognitive impairments - at baseline       Memory: Decreased short-term memory              General Comments      Exercises        Assessment/Plan    PT Assessment Patient needs continued PT services  PT Diagnosis Difficulty walking;Generalized weakness  PT Problem List    PT Treatment Interventions Gait training;Functional mobility training;Therapeutic activities;Therapeutic exercise;Balance training;DME instruction   PT Goals (Current goals can be found in the Care Plan section) Acute Rehab PT Goals PT Goal Formulation: Patient unable to participate in goal setting Time For Goal Achievement: 10/23/14 Potential to Achieve Goals: Good    Frequency     Barriers to discharge        Co-evaluation               End of Session Equipment Utilized During Treatment: Gait belt;Oxygen Activity Tolerance: Other (comment) (de-sat with gait) Patient left: in chair;with call bell/phone within reach;with chair alarm set Nurse  Communication: Mobility status;Other (comment) (o2)         Time: 1000-1018 PT Time Calculation (min) (ACUTE ONLY): 18 min   Charges:   PT Evaluation $Initial PT Evaluation Tier I: 1 Procedure     PT G Codes:        Laura [name] 10/09/2014, 10:44 AM

## 2014-10-09 NOTE — Progress Notes (Signed)
  Echocardiogram 2D Echocardiogram has been performed.  Laura [name] 10/09/2014, 12:14 PM

## 2014-10-09 NOTE — Progress Notes (Signed)
CARE MANAGEMENT NOTE 10/09/2014  Patient:  Laura [name],Laura [name]   Account Number:  [id number]  Date Initiated:  10/09/2014  Documentation initiated by:  Edwyna Shell  Subjective/Objective Assessment:   81 yo female admitted with hypoxia and fall from ALF memory care unit     Action/Plan:   discharge planning   Anticipated DC Date:  10/09/2014   Anticipated DC Plan:  ASSISTED LIVING / [place]  In-house referral  Clinical Social Worker      DC Forensic scientist  CM consult      Choice offered to / List presented to:     DME arranged  [facility]      DME agency  [place] arranged  HH-1 RN  [place] [facility]      [facility] agency  [place]   Status of service:  Completed, signed off Medicare Important Message given?   (If response is "NO", the following Medicare IM given date fields will be blank) Date Medicare IM given:   Medicare IM given by:   Date Additional Medicare IM given:   Additional Medicare IM given by:    Discharge Disposition:  ASSISTED LIVING  Per UR Regulation:    If discussed at Long Length of Stay Meetings, dates discussed:    Comments:  10/09/14 Edwyna Shell RN BSN [place] [address] [place] with Donell Sievert at [facility] and she stated that they use [facility] fro DME and [facility] for [facility] services. Explained that the attending MD ordered home O2 and she stated that the O2 would have to be delivered to the facility prior to patient trasport back. Communicated home O2 order to [place], [facility] DME rep, and she was unable to give ETA on O2 delivery at this time, will follow up. Also communicated [facility] services referral to [facility] Akin rep, to follow patient in the ALF. CSW aware that are waiting for ETA of home O2 prior to [place] transportation.

## 2014-10-09 NOTE — Progress Notes (Addendum)
SATURATION QUALIFICATIONS: (This note is used to comply with regulatory documentation for home oxygen)  Patient Saturations on Room Air at Rest = 84%  Patient Saturations on Room Air while Ambulating = 89%  Patient Saturations on 2L Liters of oxygen while Ambulating = 96%  Please briefly explain why patient needs home oxygen:Patient not able to maintain oxygen levels above normal levels without oxygen.

## 2014-10-09 NOTE — Progress Notes (Signed)
SATURATION QUALIFICATIONS: (This note is used to comply with regulatory documentation for home oxygen)    Patient Saturations on Room Air while Ambulating = 84%  Patient Saturations on 2Liters of oxygen AT REST after gait = 96%  Santiago Glad L. Tamala Julian, [place] Pager [phone] 10/09/2014

## 2014-10-09 NOTE — Progress Notes (Addendum)
Pt for discharge to [facility].   CSW facilitated pt discharge needs including contacting facility, faxing pt discharge information to facility, confirming facility reviewed information, providing RN phone number to call report, and discussing with RNCM re: arranging [place] and O2 at the facility.  [facility] will contact nursing unit when oxygen has arrived to facility. Once RN receives notification that oxygen has arrive to facility; RN to arrange PTAR transport for pt back to [facility]. (PTAR before 5 pm: [phone]; PTAR after 5 pm: [phone] option 1 for [place] and option 3 for non emergent transport). RN aware.   CSW notified pt friend, Shanon Brow via telephone who was appreciative of update.  No further social work needs identified at this time.  CSW signing off.   Alison Murray, MSW, LCSW Clinical Social Work Coverage for [place], [place] [phone]

## 2014-10-10 LAB — URINE CULTURE: Colony Count: >=100000

## 2014-10-12 DIAGNOSIS — Z9981 Dependence on supplemental oxygen: Secondary | ICD-10-CM | POA: Diagnosis not present

## 2014-10-12 DIAGNOSIS — G309 Alzheimer's disease, unspecified: Secondary | ICD-10-CM | POA: Diagnosis not present

## 2014-10-12 DIAGNOSIS — I10 Essential (primary) hypertension: Secondary | ICD-10-CM | POA: Diagnosis not present

## 2014-10-12 DIAGNOSIS — F419 Anxiety disorder, unspecified: Secondary | ICD-10-CM | POA: Diagnosis not present

## 2014-10-12 DIAGNOSIS — Z87891 Personal history of nicotine dependence: Secondary | ICD-10-CM | POA: Diagnosis not present

## 2014-10-12 DIAGNOSIS — Z9181 History of falling: Secondary | ICD-10-CM | POA: Diagnosis not present

## 2014-10-12 DIAGNOSIS — J449 Chronic obstructive pulmonary disease, unspecified: Secondary | ICD-10-CM | POA: Diagnosis not present

## 2014-10-12 DIAGNOSIS — F0280 Dementia in other diseases classified elsewhere without behavioral disturbance: Secondary | ICD-10-CM | POA: Diagnosis not present

## 2014-10-12 DIAGNOSIS — F329 Major depressive disorder, single episode, unspecified: Secondary | ICD-10-CM | POA: Diagnosis not present

## 2014-10-12 DIAGNOSIS — N390 Urinary tract infection, site not specified: Secondary | ICD-10-CM | POA: Diagnosis not present

## 2014-10-15 DIAGNOSIS — J9621 Acute and chronic respiratory failure with hypoxia: Secondary | ICD-10-CM | POA: Diagnosis not present

## 2014-10-15 DIAGNOSIS — I10 Essential (primary) hypertension: Secondary | ICD-10-CM | POA: Diagnosis not present

## 2014-10-15 DIAGNOSIS — N390 Urinary tract infection, site not specified: Secondary | ICD-10-CM | POA: Diagnosis not present

## 2014-10-15 DIAGNOSIS — J441 Chronic obstructive pulmonary disease with (acute) exacerbation: Secondary | ICD-10-CM | POA: Diagnosis not present

## 2014-10-16 DIAGNOSIS — J449 Chronic obstructive pulmonary disease, unspecified: Secondary | ICD-10-CM | POA: Diagnosis not present

## 2014-10-16 DIAGNOSIS — I10 Essential (primary) hypertension: Secondary | ICD-10-CM | POA: Diagnosis not present

## 2014-10-16 DIAGNOSIS — F419 Anxiety disorder, unspecified: Secondary | ICD-10-CM | POA: Diagnosis not present

## 2014-10-16 DIAGNOSIS — G309 Alzheimer's disease, unspecified: Secondary | ICD-10-CM | POA: Diagnosis not present

## 2014-10-16 DIAGNOSIS — N390 Urinary tract infection, site not specified: Secondary | ICD-10-CM | POA: Diagnosis not present

## 2014-10-16 DIAGNOSIS — F0280 Dementia in other diseases classified elsewhere without behavioral disturbance: Secondary | ICD-10-CM | POA: Diagnosis not present

## 2014-10-18 DIAGNOSIS — F0280 Dementia in other diseases classified elsewhere without behavioral disturbance: Secondary | ICD-10-CM | POA: Diagnosis not present

## 2014-10-18 DIAGNOSIS — F419 Anxiety disorder, unspecified: Secondary | ICD-10-CM | POA: Diagnosis not present

## 2014-10-18 DIAGNOSIS — N390 Urinary tract infection, site not specified: Secondary | ICD-10-CM | POA: Diagnosis not present

## 2014-10-18 DIAGNOSIS — J449 Chronic obstructive pulmonary disease, unspecified: Secondary | ICD-10-CM | POA: Diagnosis not present

## 2014-10-18 DIAGNOSIS — G309 Alzheimer's disease, unspecified: Secondary | ICD-10-CM | POA: Diagnosis not present

## 2014-10-18 DIAGNOSIS — I10 Essential (primary) hypertension: Secondary | ICD-10-CM | POA: Diagnosis not present

## 2014-10-19 DIAGNOSIS — F419 Anxiety disorder, unspecified: Secondary | ICD-10-CM | POA: Diagnosis not present

## 2014-10-19 DIAGNOSIS — G309 Alzheimer's disease, unspecified: Secondary | ICD-10-CM | POA: Diagnosis not present

## 2014-10-19 DIAGNOSIS — I10 Essential (primary) hypertension: Secondary | ICD-10-CM | POA: Diagnosis not present

## 2014-10-19 DIAGNOSIS — F0280 Dementia in other diseases classified elsewhere without behavioral disturbance: Secondary | ICD-10-CM | POA: Diagnosis not present

## 2014-10-19 DIAGNOSIS — J449 Chronic obstructive pulmonary disease, unspecified: Secondary | ICD-10-CM | POA: Diagnosis not present

## 2014-10-19 DIAGNOSIS — N390 Urinary tract infection, site not specified: Secondary | ICD-10-CM | POA: Diagnosis not present

## 2014-10-21 DIAGNOSIS — F419 Anxiety disorder, unspecified: Secondary | ICD-10-CM | POA: Diagnosis not present

## 2014-10-21 DIAGNOSIS — G309 Alzheimer's disease, unspecified: Secondary | ICD-10-CM | POA: Diagnosis not present

## 2014-10-21 DIAGNOSIS — J449 Chronic obstructive pulmonary disease, unspecified: Secondary | ICD-10-CM | POA: Diagnosis not present

## 2014-10-21 DIAGNOSIS — F0280 Dementia in other diseases classified elsewhere without behavioral disturbance: Secondary | ICD-10-CM | POA: Diagnosis not present

## 2014-10-21 DIAGNOSIS — I10 Essential (primary) hypertension: Secondary | ICD-10-CM | POA: Diagnosis not present

## 2014-10-21 DIAGNOSIS — N390 Urinary tract infection, site not specified: Secondary | ICD-10-CM | POA: Diagnosis not present

## 2014-10-22 ENCOUNTER — Encounter (HOSPITAL_COMMUNITY): Payer: Self-pay | Admitting: *Deleted

## 2014-10-22 ENCOUNTER — Inpatient Hospital Stay (HOSPITAL_COMMUNITY)
Admission: EM | Admit: 2014-10-22 | Discharge: 2014-10-25 | DRG: 689 | Payer: Medicare Other | Attending: Internal Medicine | Admitting: Internal Medicine

## 2014-10-22 DIAGNOSIS — Z885 Allergy status to narcotic agent status: Secondary | ICD-10-CM

## 2014-10-22 DIAGNOSIS — N183 Chronic kidney disease, stage 3 unspecified: Secondary | ICD-10-CM | POA: Insufficient documentation

## 2014-10-22 DIAGNOSIS — G92 Toxic encephalopathy: Secondary | ICD-10-CM | POA: Diagnosis not present

## 2014-10-22 DIAGNOSIS — R4182 Altered mental status, unspecified: Secondary | ICD-10-CM

## 2014-10-22 DIAGNOSIS — N179 Acute kidney failure, unspecified: Secondary | ICD-10-CM | POA: Diagnosis present

## 2014-10-22 DIAGNOSIS — Z87891 Personal history of nicotine dependence: Secondary | ICD-10-CM

## 2014-10-22 DIAGNOSIS — E860 Dehydration: Secondary | ICD-10-CM | POA: Diagnosis present

## 2014-10-22 DIAGNOSIS — N390 Urinary tract infection, site not specified: Principal | ICD-10-CM

## 2014-10-22 DIAGNOSIS — F419 Anxiety disorder, unspecified: Secondary | ICD-10-CM | POA: Diagnosis present

## 2014-10-22 DIAGNOSIS — I129 Hypertensive chronic kidney disease with stage 1 through stage 4 chronic kidney disease, or unspecified chronic kidney disease: Secondary | ICD-10-CM | POA: Diagnosis present

## 2014-10-22 DIAGNOSIS — F0280 Dementia in other diseases classified elsewhere without behavioral disturbance: Secondary | ICD-10-CM

## 2014-10-22 DIAGNOSIS — G4700 Insomnia, unspecified: Secondary | ICD-10-CM | POA: Diagnosis present

## 2014-10-22 DIAGNOSIS — F418 Other specified anxiety disorders: Secondary | ICD-10-CM | POA: Diagnosis present

## 2014-10-22 DIAGNOSIS — I10 Essential (primary) hypertension: Secondary | ICD-10-CM | POA: Diagnosis not present

## 2014-10-22 DIAGNOSIS — G309 Alzheimer's disease, unspecified: Secondary | ICD-10-CM | POA: Diagnosis not present

## 2014-10-22 DIAGNOSIS — F329 Major depressive disorder, single episode, unspecified: Secondary | ICD-10-CM | POA: Diagnosis present

## 2014-10-22 DIAGNOSIS — R40244 Other coma, without documented Glasgow coma scale score, or with partial score reported: Secondary | ICD-10-CM | POA: Diagnosis not present

## 2014-10-22 DIAGNOSIS — M1990 Unspecified osteoarthritis, unspecified site: Secondary | ICD-10-CM | POA: Diagnosis present

## 2014-10-22 LAB — COMPREHENSIVE METABOLIC PANEL
ALBUMIN: 3.9 g/dL (ref 3.5–5.2)
ALT: 20 U/L (ref 0–35)
AST: 20 U/L (ref 0–37)
Alkaline Phosphatase: 59 U/L (ref 39–117)
Anion gap: 10 (ref 5–15)
BUN: 37 mg/dL — ABNORMAL HIGH (ref 6–23)
CALCIUM: 8.9 mg/dL (ref 8.4–10.5)
CHLORIDE: 104 mmol/L (ref 96–112)
CO2: 26 mmol/L (ref 19–32)
Creatinine, Ser: 1.46 mg/dL — ABNORMAL HIGH (ref 0.50–1.10)
GFR calc Af Amer: 36 mL/min — ABNORMAL LOW (ref >90)
GFR, EST NON AFRICAN AMERICAN: 31 mL/min — AB (ref >90)
Glucose, Bld: 94 mg/dL (ref 70–99)
Potassium: 4.8 mmol/L (ref 3.5–5.1)
SODIUM: 140 mmol/L (ref 135–145)
Total Bilirubin: 0.5 mg/dL (ref 0.3–1.2)
Total Protein: 6.1 g/dL (ref 6.0–8.3)

## 2014-10-22 LAB — URINE MICROSCOPIC-ADD ON

## 2014-10-22 LAB — URINALYSIS, ROUTINE W REFLEX MICROSCOPIC
Bilirubin Urine: NEGATIVE
Glucose, UA: NEGATIVE mg/dL
Ketones, ur: NEGATIVE mg/dL
NITRITE: POSITIVE — AB
PH: 6 (ref 5.0–8.0)
Protein, ur: NEGATIVE mg/dL
SPECIFIC GRAVITY, URINE: 1.022 (ref 1.005–1.030)
UROBILINOGEN UA: 0.2 mg/dL (ref 0.0–1.0)

## 2014-10-22 LAB — CBC
HEMATOCRIT: 44 % (ref 36.0–46.0)
Hemoglobin: 13.8 g/dL (ref 12.0–15.0)
MCH: 29.4 pg (ref 26.0–34.0)
MCHC: 31.4 g/dL (ref 30.0–36.0)
MCV: 93.8 fL (ref 78.0–100.0)
PLATELETS: 216 10*3/uL (ref 150–400)
RBC: 4.69 MIL/uL (ref 3.87–5.11)
RDW: 14.1 % (ref 11.5–15.5)
WBC: 8.4 10*3/uL (ref 4.0–10.5)

## 2014-10-22 LAB — CBG MONITORING, ED: GLUCOSE-CAPILLARY: 90 mg/dL (ref 70–99)

## 2014-10-22 MED ORDER — DEXTROSE 5 % IV SOLN
1.0000 g | Freq: Once | INTRAVENOUS | Status: AC
  Administered 2014-10-22: 1 g via INTRAVENOUS
  Filled 2014-10-22: qty 10

## 2014-10-22 MED ORDER — DIPHENHYDRAMINE HCL 50 MG/ML IJ SOLN
INTRAMUSCULAR | Status: AC
  Administered 2014-10-22: 25 mg via INTRAMUSCULAR
  Filled 2014-10-22: qty 1

## 2014-10-22 MED ORDER — SULFAMETHOXAZOLE-TRIMETHOPRIM 800-160 MG PO TABS: 1.0000 | ORAL_TABLET | Freq: Two times a day (BID) | ORAL | Status: DC

## 2014-10-22 MED ORDER — DIPHENHYDRAMINE HCL 50 MG/ML IJ SOLN
25.0000 mg | Freq: Once | INTRAMUSCULAR | Status: AC
  Administered 2014-10-22: 25 mg via INTRAMUSCULAR

## 2014-10-22 NOTE — ED Notes (Signed)
Bed: [facility] Expected date:  Expected time:  Means of arrival:  Comments: Ems- elderly- vomiting

## 2014-10-22 NOTE — ED Notes (Signed)
Pt refusing to have vitals taken at this time.

## 2014-10-22 NOTE — Discharge Instructions (Signed)
The patient was found to be slightly confused in the ER. Lab work and urinalysis was performed. Labwork was noncontributory but she was found to have a UTI. She was treated with IV antibiotics (Rocephin) the ER. She will not need another antibiotic for 12 hours. She received the IV antibiotics at 5 PM. She did have some mild worsening of her confusion in the ER but I believe this to be related to her UTI. Will recommend she return for any worsening symptoms, fever, vomiting.

## 2014-10-22 NOTE — Progress Notes (Signed)
Clinical Social Work Department BRIEF PSYCHOSOCIAL ASSESSMENT 10/22/2014  Patient:  Laura [name],Laura [name]     Account Number:  [id number]     Admit date:  10/22/2014  Clinical Social Worker:  Tilda Burrow, CLINICAL SOCIAL WORKER  Date/Time:  10/22/2014 12:00 [name]  Referred by:  CSW  Date Referred:  10/22/2014 Referred for  Other - See comment   Other Referral:   The pt does not currently need a referral from [facility]. The pt currently stays at [facility].   Interview type:  Patient Other interview type:   Patient was the only interview type. No one else was preent during the interview.    PSYCHOSOCIAL DATA Living Status:  FACILITY Admitted from facility:   Level of care:  Assisted Living Primary support name:  N/a Primary support relationship to patient:  NONE Degree of support available:   Patient informed CSW that she does not have any family or friends in [place].    CURRENT CONCERNS Current Concerns  Adjustment to Illness   Other Concerns:    SOCIAL WORK ASSESSMENT / PLAN CSW met with pt at bedside. There was no family present. Per note, pt comes from [facility]. Per note, staff reports pt is not acting herself, "normally can hold a conversation" , but today just rambling.  Patient appeared to be slightly confused during interview. Per note, pt has a hx of dementia.    Per note, pt has a hx of dementia and has a hx of frequent UTI. Patient appeared to be confused during interview.    Patient states that she receives assistance with her ADL's. She states she has not fallen in the past 6 months. When CSW asked about her support system , the pt stated she does not have any family or friends in [place].    Willette Brace [phone] ED CSW 10/22/2014 6:49 PM   Assessment/plan status:  No Further Intervention Required Other assessment/ plan:   Information/referral to community resources:   No community referral needed at this time. The pt comes from  [facility].    PATIENT'S/FAMILY'S RESPONSE TO PLAN OF CARE: There was no family present.    Willette Brace [phone] ED CSW 10/22/2014 11:37 PM

## 2014-10-22 NOTE — Progress Notes (Signed)
CSW met with pt at bedside. There was no family present. Per note, pt comes from [facility]. Per note, staff reports pt is not acting herself, "normally can hold a conversation" , but today just rambling.  Patient appeared to be slightly confused during interview. Per note, pt has a hx of dementia.   Per note, pt has a hx of dementia and has a hx of frequent UTI. Patient appeared to be confused during interview.   Patient states that she receives assistance with her ADL's. She states she has not fallen in the past 6 months. When CSW asked about her support system , the pt stated she does not have any family or friends in [place].  Willette Brace [phone] ED CSW 10/22/2014 6:49 PM

## 2014-10-22 NOTE — ED Provider Notes (Addendum)
CSN: [id number]     Arrival date & time 10/22/14  1501 History   First MD Initiated Contact with Patient 10/22/14 1520     Chief Complaint  Patient presents with  . Altered Mental Status     (Consider location/radiation/quality/duration/timing/severity/associated sxs/prior Treatment) Patient is a 81 y.o. female presenting with altered mental status. The history is provided by the patient.  Altered Mental Status Presenting symptoms: behavior changes   Severity:  Mild Most recent episode:  Today Episode history:  Continuous Timing:  Constant Progression:  Unchanged Chronicity:  New Context comment:  Spontaneously Associated symptoms: no abdominal pain, no fever, no headaches, no nausea and no vomiting     Past Medical History  Diagnosis Date  . Hypertension   . Arthritis   . Alzheimer's dementia   . Anxiety   . Depression    Past Surgical History  Procedure Laterality Date  . Shoulder surgery      Left   . Loop recorder implant N/A 06/22/2012    Procedure: LOOP RECORDER IMPLANT;  Surgeon: Sanda Klein, MD;  Location: [facility] CATH LAB;  Service: Cardiovascular;  Laterality: N/A;   History reviewed. No pertinent family history. History  Substance Use Topics  . Smoking status: Former Research scientist (life sciences)  . Smokeless tobacco: Not on file  . Alcohol Use: No   OB History    No data available     Review of Systems  Constitutional: Negative for fever and fatigue.  HENT: Negative for congestion and drooling.   Eyes: Negative for pain.  Respiratory: Negative for cough and shortness of breath.   Cardiovascular: Negative for chest pain.  Gastrointestinal: Negative for nausea, vomiting, abdominal pain and diarrhea.  Genitourinary: Negative for dysuria and hematuria.  Musculoskeletal: Negative for back pain, gait problem and neck pain.  Skin: Negative for color change.  Neurological: Negative for dizziness and headaches.  Hematological: Negative for adenopathy.  Psychiatric/Behavioral:  Negative for behavioral problems.  All other systems reviewed and are negative.     Allergies  Codeine  Home Medications   Prior to Admission medications   Medication Sig Start Date End Date Taking? Authorizing Provider  acetaminophen (MAPAP) 500 MG tablet Take 500 mg by mouth every 6 (six) hours as needed for mild pain, fever or headache.    Yes Historical Provider, MD  albuterol (PROVENTIL HFA;VENTOLIN HFA) 108 (90 BASE) MCG/ACT inhaler Inhale 2 puffs into the lungs every 6 (six) hours as needed for wheezing or shortness of breath. 10/09/14  Yes Nishant Dhungel, MD  alum & mag hydroxide-simeth (MI-ACID MAXIMUM STRENGTH) 400-400-40 MG/5ML suspension Take 30 mLs by mouth every 6 (six) hours as needed for indigestion.    Yes Historical Provider, MD  Carbomer Gel Base (HYDROGEL) GEL Apply 1 application topically daily as needed (for rash). Apply to face   Yes Historical Provider, MD  cholecalciferol (VITAMIN D) 1000 UNITS tablet Take 1,000 Units by mouth daily with breakfast.    Yes Historical Provider, MD  guaifenesin (Q-TUSSIN) 100 MG/5ML syrup Take 200 mg by mouth every 6 (six) hours as needed for cough.    Yes Historical Provider, MD  HYDROcodone-acetaminophen (NORCO/VICODIN) 5-325 MG per tablet Take 1 tablet by mouth 3 (three) times daily. 10/09/14  Yes Nishant Dhungel, MD  loperamide (IMODIUM) 2 MG capsule Take 2 mg by mouth as needed for diarrhea or loose stools. NOT TO EXCEED 8 DOSES IN 24 HR   Yes Historical Provider, MD  magnesium hydroxide (MILK OF MAGNESIA) 400 MG/5ML suspension Take 30 mLs  by mouth daily as needed for mild constipation.    Yes Historical Provider, MD  meloxicam (MOBIC) 7.5 MG tablet Take 7.5 mg by mouth daily with breakfast.   Yes Historical Provider, MD  Naphazoline-Glycerin-Zinc Sulf (CLEAR EYES MAXIMUM ITCHY EYE OP) Place 1-2 drops into both eyes 3 (three) times daily as needed (for itching and redness). Wait 3 to 5 minutes between each drop   Yes Historical  Provider, MD  Neomycin-Bacitracin-Polymyxin (HCA TRIPLE ANTIBIOTIC OINTMENT EX) Apply 1 application topically as needed (for wound care).    Yes Historical Provider, MD  NUTRITIONAL SUPPLEMENT LIQD Take 1 Bottle by mouth 3 (three) times daily.   Yes Historical Provider, MD  PARoxetine (PAXIL) 40 MG tablet Take 40 mg by mouth every morning.   Yes Historical Provider, MD  polyethylene glycol (MIRALAX / GLYCOLAX) packet Take 17 g by mouth daily as needed for mild constipation.    Yes Historical Provider, MD  temazepam (RESTORIL) 7.5 MG capsule Take 7.5 mg by mouth at bedtime. 03/09/14  Yes Historical Provider, MD  cephALEXin (KEFLEX) 500 MG capsule Take 1 capsule (500 mg total) by mouth 2 (two) times daily. Patient not taking: Reported on 10/22/2014 10/09/14   Nishant Dhungel, MD   BP 113/57 mmHg  Pulse 76  Temp(Src) 97.7 F (36.5 C) (Oral)  Resp 16  SpO2 95% Physical Exam  Constitutional: She appears well-developed and well-nourished.  HENT:  Head: Normocephalic.  Mouth/Throat: Oropharynx is clear and moist. No oropharyngeal exudate.  Eyes: Conjunctivae and EOM are normal. Pupils are equal, round, and reactive to light.  Neck: Normal range of motion. Neck supple.  Cardiovascular: Normal rate, regular rhythm, normal heart sounds and intact distal pulses.  Exam reveals no gallop and no friction rub.   No murmur heard. Pulmonary/Chest: Effort normal and breath sounds normal. No respiratory distress. She has no wheezes.  Abdominal: Soft. Bowel sounds are normal. There is no tenderness. There is no rebound and no guarding.  Musculoskeletal: Normal range of motion. She exhibits no edema or tenderness.  Neurological: She is alert.  alert, oriented x2 speech: normal in context and clarity memory: intact grossly cranial nerves II-XII: intact motor strength: full proximally and distally sensation: intact to light touch diffusely  cerebellar: finger-to-nose intact gait: normal forwards and  backwards w/ mild help from me  Skin: Skin is warm and dry.  Psychiatric: She has a normal mood and affect. Her behavior is normal.  Nursing note and vitals reviewed.   ED Course  Procedures (including critical care time) Labs Review Labs Reviewed  COMPREHENSIVE METABOLIC PANEL - Abnormal; Notable for the following:    BUN 37 (*)    Creatinine, Ser 1.46 (*)    GFR calc non Af Amer 31 (*)    GFR calc Af Amer 36 (*)    All other components within normal limits  URINALYSIS, ROUTINE W REFLEX MICROSCOPIC - Abnormal; Notable for the following:    APPearance CLOUDY (*)    Hgb urine dipstick TRACE (*)    Nitrite POSITIVE (*)    Leukocytes, UA LARGE (*)    All other components within normal limits  URINE MICROSCOPIC-ADD ON - Abnormal; Notable for the following:    Bacteria, UA MANY (*)    All other components within normal limits  URINE CULTURE  CBC  CBG MONITORING, ED    Imaging Review No results found.   EKG Interpretation None      MDM   Final diagnoses:  UTI (lower urinary tract  infection)    3:55 PM 81 y.o. female with a history of hypertension, Alzheimer's dementia who presents with report of change in patient's condition. EMS is reporting that the patient was having difficulty holding a conversation and was rambling today. She has a history of dementia and UTIs. She was recently admitted for fall, UTI, and hypoxia. The hypoxia was thought to be related to underlying COPD the patient was discharged with oxygen via nasal cannula. The urine culture grew out Escherichia coli which was appropriately susceptible to the antibiotic she was given. The patient is alert and oriented 2 on my exam. She does not know the year. She is otherwise ambulatory and has a normal neurologic exam for me. I called [facility] for more information but change of shift had occurred and they could only tell me that she had a condition change. We'll get screening lab work and urinalysis. The patient  denies any complaints on exam.  6:18 PM: The patient was found to have a UTI. Will treat with Bactrim as she was previously treated with Keflex. Previous urine culture susceptible to both. She did have some mild worsening of confusion in the ER. This will be relayed by the nurse to her nursing facility. Given her normal vital signs and noncontributory labs I think it is reasonable for her to be treated as an outpatient  8:56 PM while waiting for EMS transport the patient became more agitated and confused. We had increased difficulty trying to transfer her to the stretcher and she was combative. She required chemical sedation with Benadryl. I ultimately decided to admit the patient as I felt she would probably benefit from treatment of the UTI until her mental status cleared.   Medications given during this visit Medications  cefTRIAXone (ROCEPHIN) 1 g in dextrose 5 % 50 mL IVPB (0 g Intravenous Stopped 10/22/14 1809)    New Prescriptions   SULFAMETHOXAZOLE-TRIMETHOPRIM (SEPTRA DS) 800-160 MG PER TABLET    Take 1 tablet by mouth every 12 (twelve) hours.       Pamella Pert, MD 10/22/14 4627  Pamella Pert, MD 10/22/14 [phone]

## 2014-10-22 NOTE — ED Notes (Signed)
Per ems pt is from [facility], staff reports pt is not acting herself, "normally can hold a conversation", but today just rambling. Hx of frequent UTI. Hx of dementia. Staff reports pt has been more aggressive towards other residents.

## 2014-10-22 NOTE — ED Notes (Signed)
Pt denies complaint of pain. Pt able to verbalize name, date of birth, and at [facility].

## 2014-10-22 NOTE — ED Notes (Signed)
Patient is waiting on PTAR. 

## 2014-10-22 NOTE — ED Notes (Signed)
Bed: WA09 Expected date:  Expected time:  Means of arrival:  Comments: Ems- 81 yo, aggressive, hx UTI

## 2014-10-22 NOTE — H&P (Signed)
[facility] History and Physical  Laura [name] OQH:[id number] DOB: October 28, 1923 DOA: 10/22/2014  Referring physician: Pamella Pert, MD PCP: Sande Brothers, MD   Chief Complaint: Altered Mental Status  HPI: Laura [name] is a 81 y.o. female presents with altered mental status. Patient is not able to provide any history. Patient was transferred from a SNF. She had been recently admitted with a UTI. Patient was noted to be confused and agitated in the ED. Originally she was slated to be discharged however she became more confused while here in the ED. She had been reported to be alert an oriented. Patient was given some benadryl and now seems to be doing better and is more calm.   Review of Systems:  Patient is not able to participate in a ROS  Past Medical History  Diagnosis Date  . Hypertension   . Arthritis   . Alzheimer's dementia   . Anxiety   . Depression    Past Surgical History  Procedure Laterality Date  . Shoulder surgery      Left   . Loop recorder implant N/A 06/22/2012    Procedure: LOOP RECORDER IMPLANT;  Surgeon: Sanda Klein, MD;  Location: [facility] CATH LAB;  Service: Cardiovascular;  Laterality: N/A;   Social History:  reports that she has quit smoking. She does not have any smokeless tobacco history on file. She reports that she does not drink alcohol or use illicit drugs.  Allergies  Allergen Reactions  . Codeine     Makes her ill    History reviewed. No pertinent family history.   Prior to Admission medications   Medication Sig Start Date End Date Taking? Authorizing Provider  acetaminophen (MAPAP) 500 MG tablet Take 500 mg by mouth every 6 (six) hours as needed for mild pain, fever or headache.    Yes Historical Provider, MD  albuterol (PROVENTIL HFA;VENTOLIN HFA) 108 (90 BASE) MCG/ACT inhaler Inhale 2 puffs into the lungs every 6 (six) hours as needed for wheezing or shortness of breath. 10/09/14  Yes Nishant Dhungel, MD  alum & mag  hydroxide-simeth (MI-ACID MAXIMUM STRENGTH) 400-400-40 MG/5ML suspension Take 30 mLs by mouth every 6 (six) hours as needed for indigestion.    Yes Historical Provider, MD  Carbomer Gel Base (HYDROGEL) GEL Apply 1 application topically daily as needed (for rash). Apply to face   Yes Historical Provider, MD  cholecalciferol (VITAMIN D) 1000 UNITS tablet Take 1,000 Units by mouth daily with breakfast.    Yes Historical Provider, MD  guaifenesin (Q-TUSSIN) 100 MG/5ML syrup Take 200 mg by mouth every 6 (six) hours as needed for cough.    Yes Historical Provider, MD  HYDROcodone-acetaminophen (NORCO/VICODIN) 5-325 MG per tablet Take 1 tablet by mouth 3 (three) times daily. 10/09/14  Yes Nishant Dhungel, MD  loperamide (IMODIUM) 2 MG capsule Take 2 mg by mouth as needed for diarrhea or loose stools. NOT TO EXCEED 8 DOSES IN 24 HR   Yes Historical Provider, MD  magnesium hydroxide (MILK OF MAGNESIA) 400 MG/5ML suspension Take 30 mLs by mouth daily as needed for mild constipation.    Yes Historical Provider, MD  meloxicam (MOBIC) 7.5 MG tablet Take 7.5 mg by mouth daily with breakfast.   Yes Historical Provider, MD  Naphazoline-Glycerin-Zinc Sulf (CLEAR EYES MAXIMUM ITCHY EYE OP) Place 1-2 drops into both eyes 3 (three) times daily as needed (for itching and redness). Wait 3 to 5 minutes between each drop   Yes Historical Provider, MD  Neomycin-Bacitracin-Polymyxin ([place]  OINTMENT EX) Apply 1 application topically as needed (for wound care).    Yes Historical Provider, MD  NUTRITIONAL SUPPLEMENT LIQD Take 1 Bottle by mouth 3 (three) times daily.   Yes Historical Provider, MD  PARoxetine (PAXIL) 40 MG tablet Take 40 mg by mouth every morning.   Yes Historical Provider, MD  polyethylene glycol (MIRALAX / GLYCOLAX) packet Take 17 g by mouth daily as needed for mild constipation.    Yes Historical Provider, MD  temazepam (RESTORIL) 7.5 MG capsule Take 7.5 mg by mouth at bedtime. 03/09/14  Yes  Historical Provider, MD  cephALEXin (KEFLEX) 500 MG capsule Take 1 capsule (500 mg total) by mouth 2 (two) times daily. Patient not taking: Reported on 10/22/2014 10/09/14   Nishant Dhungel, MD  sulfamethoxazole-trimethoprim (SEPTRA DS) 800-160 MG per tablet Take 1 tablet by mouth every 12 (twelve) hours. 10/22/14 10/25/14  Pamella Pert, MD   Physical Exam: Filed Vitals:   10/22/14 1516 10/22/14 1517 10/22/14 1653 10/22/14 1806  BP: 113/57  116/55 115/58  Pulse: 76  71 88  Temp: 97.7 F (36.5 C)   97.8 F (36.6 C)  TempSrc: Oral   Oral  Resp: 16  16 18   SpO2: 94% 95% 94% 95%    Wt Readings from Last 3 Encounters:  03/12/14 72.893 kg (160 lb 11.2 oz)  01/17/13 63.957 kg (141 lb)  06/22/12 77.111 kg (170 lb)    General:  Appears calm and comfortable now Eyes: PERRL, normal lids ENT: grossly normal hearing Neck: no LAD, masses  Cardiovascular: RRR, no m/r/g. No LE edema. Respiratory: CTA bilaterally, no w/r/r. Normal respiratory effort. Abdomen: soft, ntnd Skin: no rash or induration seen on limited exam Musculoskeletal: grossly normal tone BUE/BLE Neurologic: grossly non-focal not able to fully assess due to her mental status          Labs on Admission:  Basic Metabolic Panel:  Recent Labs Lab 10/22/14 1532  NA 140  K 4.8  CL 104  CO2 26  GLUCOSE 94  BUN 37*  CREATININE 1.46*  CALCIUM 8.9   Liver Function Tests:  Recent Labs Lab 10/22/14 1532  AST 20  ALT 20  ALKPHOS 59  BILITOT 0.5  PROT 6.1  ALBUMIN 3.9   No results for input(s): LIPASE, AMYLASE in the last 168 hours. No results for input(s): AMMONIA in the last 168 hours. CBC:  Recent Labs Lab 10/22/14 1532  WBC 8.4  HGB 13.8  HCT 44.0  MCV 93.8  PLT 216   Cardiac Enzymes: No results for input(s): CKTOTAL, CKMB, CKMBINDEX, TROPONINI in the last 168 hours.  BNP (last 3 results)  Recent Labs  10/08/14 0255  BNP 34.1    ProBNP (last 3 results) No results for input(s): PROBNP in the  last 8760 hours.  CBG:  Recent Labs Lab 10/22/14 1533  GLUCAP 90    Radiological Exams on Admission: No results found.    Assessment/Plan Active Problems:   Alzheimer's dementia   Hypertension   UTI (urinary tract infection)   Altered mental state   1. Altered Mental Status -possible sundowning -will given PRN meds for agitation -may need sitter due to self danger  2. Hypertension -will continue with home medications -monitor pressures  3. UTI -start on Rocephin -await cultures -also dehydrated will hydrate  4. CKD III -monitor labs -hydrate as needed   Code Status: Full Code (must indicate code status--if unknown or must be presumed, indicate so) DVT Prophylaxis:Heaprin Family Communication: None (indicate person spoken  with, if applicable, with phone number if by telephone) Disposition Plan: Home (indicate anticipated LOS)  Time spent: 8min  Aedan Geimer A [facility] Pager [phone]

## 2014-10-22 NOTE — ED Notes (Signed)
Upon PTARS arrival pt became agitated and aggressive towards staff. Pt hitting, kicking, spitting and biting staff while trying to assist her onto the EMS bed. MD called to beside where to the decision was reached to admit pt due to the increased aggression.

## 2014-10-23 DIAGNOSIS — R4182 Altered mental status, unspecified: Secondary | ICD-10-CM | POA: Diagnosis not present

## 2014-10-23 DIAGNOSIS — G9340 Encephalopathy, unspecified: Secondary | ICD-10-CM | POA: Diagnosis not present

## 2014-10-23 DIAGNOSIS — G4700 Insomnia, unspecified: Secondary | ICD-10-CM | POA: Diagnosis present

## 2014-10-23 DIAGNOSIS — N390 Urinary tract infection, site not specified: Secondary | ICD-10-CM | POA: Diagnosis not present

## 2014-10-23 DIAGNOSIS — Z885 Allergy status to narcotic agent status: Secondary | ICD-10-CM | POA: Diagnosis not present

## 2014-10-23 DIAGNOSIS — F0280 Dementia in other diseases classified elsewhere without behavioral disturbance: Secondary | ICD-10-CM | POA: Diagnosis present

## 2014-10-23 DIAGNOSIS — N179 Acute kidney failure, unspecified: Secondary | ICD-10-CM | POA: Diagnosis not present

## 2014-10-23 DIAGNOSIS — E860 Dehydration: Secondary | ICD-10-CM | POA: Diagnosis present

## 2014-10-23 DIAGNOSIS — M1990 Unspecified osteoarthritis, unspecified site: Secondary | ICD-10-CM | POA: Diagnosis present

## 2014-10-23 DIAGNOSIS — G92 Toxic encephalopathy: Secondary | ICD-10-CM | POA: Diagnosis present

## 2014-10-23 DIAGNOSIS — Z87891 Personal history of nicotine dependence: Secondary | ICD-10-CM | POA: Diagnosis not present

## 2014-10-23 DIAGNOSIS — I10 Essential (primary) hypertension: Secondary | ICD-10-CM | POA: Diagnosis not present

## 2014-10-23 DIAGNOSIS — F99 Mental disorder, not otherwise specified: Secondary | ICD-10-CM | POA: Diagnosis not present

## 2014-10-23 DIAGNOSIS — F418 Other specified anxiety disorders: Secondary | ICD-10-CM | POA: Diagnosis present

## 2014-10-23 DIAGNOSIS — N183 Chronic kidney disease, stage 3 (moderate): Secondary | ICD-10-CM | POA: Diagnosis present

## 2014-10-23 DIAGNOSIS — I129 Hypertensive chronic kidney disease with stage 1 through stage 4 chronic kidney disease, or unspecified chronic kidney disease: Secondary | ICD-10-CM | POA: Diagnosis present

## 2014-10-23 DIAGNOSIS — G309 Alzheimer's disease, unspecified: Secondary | ICD-10-CM | POA: Diagnosis not present

## 2014-10-23 DIAGNOSIS — F329 Major depressive disorder, single episode, unspecified: Secondary | ICD-10-CM | POA: Diagnosis present

## 2014-10-23 DIAGNOSIS — F419 Anxiety disorder, unspecified: Secondary | ICD-10-CM | POA: Diagnosis present

## 2014-10-23 LAB — CBC
HCT: 42.8 % (ref 36.0–46.0)
Hemoglobin: 13.1 g/dL (ref 12.0–15.0)
MCH: 28.5 pg (ref 26.0–34.0)
MCHC: 30.6 g/dL (ref 30.0–36.0)
MCV: 93.2 fL (ref 78.0–100.0)
PLATELETS: 220 10*3/uL (ref 150–400)
RBC: 4.59 MIL/uL (ref 3.87–5.11)
RDW: 13.9 % (ref 11.5–15.5)
WBC: 7.3 10*3/uL (ref 4.0–10.5)

## 2014-10-23 LAB — COMPREHENSIVE METABOLIC PANEL
ALT: 18 U/L (ref 0–35)
AST: 21 U/L (ref 0–37)
Albumin: 3.5 g/dL (ref 3.5–5.2)
Alkaline Phosphatase: 53 U/L (ref 39–117)
Anion gap: 6 (ref 5–15)
BUN: 32 mg/dL — ABNORMAL HIGH (ref 6–23)
CO2: 29 mmol/L (ref 19–32)
Calcium: 8.7 mg/dL (ref 8.4–10.5)
Chloride: 108 mmol/L (ref 96–112)
Creatinine, Ser: 1.3 mg/dL — ABNORMAL HIGH (ref 0.50–1.10)
GFR calc Af Amer: 41 mL/min — ABNORMAL LOW (ref >90)
GFR calc non Af Amer: 36 mL/min — ABNORMAL LOW (ref >90)
Glucose, Bld: 84 mg/dL (ref 70–99)
Potassium: 4.6 mmol/L (ref 3.5–5.1)
Sodium: 143 mmol/L (ref 135–145)
Total Bilirubin: 0.4 mg/dL (ref 0.3–1.2)
Total Protein: 5.6 g/dL — ABNORMAL LOW (ref 6.0–8.3)

## 2014-10-23 LAB — TSH: TSH: 0.871 u[IU]/mL (ref 0.350–4.500)

## 2014-10-23 LAB — GLUCOSE, CAPILLARY: Glucose-Capillary: 88 mg/dL (ref 70–99)

## 2014-10-23 MED ORDER — POLYETHYLENE GLYCOL 3350 17 G PO PACK: 17.0000 g | PACK | Freq: Every day | ORAL | Status: DC | PRN

## 2014-10-23 MED ORDER — PAROXETINE HCL 20 MG PO TABS
40.0000 mg | ORAL_TABLET | Freq: Every day | ORAL | Status: DC
  Administered 2014-10-23 – 2014-10-25 (×3): 40 mg via ORAL
  Filled 2014-10-23 (×3): qty 2

## 2014-10-23 MED ORDER — HEPARIN SODIUM (PORCINE) 5000 UNIT/ML IJ SOLN
5000.0000 [IU] | Freq: Three times a day (TID) | INTRAMUSCULAR | Status: DC
  Administered 2014-10-23 – 2014-10-25 (×8): 5000 [IU] via SUBCUTANEOUS
  Filled 2014-10-23 (×11): qty 1

## 2014-10-23 MED ORDER — LOPERAMIDE HCL 2 MG PO CAPS: 2.0000 mg | ORAL_CAPSULE | ORAL | Status: DC | PRN

## 2014-10-23 MED ORDER — MELOXICAM 7.5 MG PO TABS
7.5000 mg | ORAL_TABLET | Freq: Every day | ORAL | Status: DC
  Administered 2014-10-23 – 2014-10-25 (×3): 7.5 mg via ORAL
  Filled 2014-10-23 (×4): qty 1

## 2014-10-23 MED ORDER — ACETAMINOPHEN 325 MG PO TABS: 650.0000 mg | ORAL_TABLET | Freq: Four times a day (QID) | ORAL | Status: DC | PRN

## 2014-10-23 MED ORDER — VITAMIN D3 25 MCG (1000 UNIT) PO TABS
1000.0000 [IU] | ORAL_TABLET | Freq: Every day | ORAL | Status: DC
  Administered 2014-10-24 – 2014-10-25 (×2): 1000 [IU] via ORAL
  Filled 2014-10-23 (×5): qty 1

## 2014-10-23 MED ORDER — MAGNESIUM HYDROXIDE 400 MG/5ML PO SUSP: 30.0000 mL | Freq: Every day | ORAL | Status: DC | PRN

## 2014-10-23 MED ORDER — VITAMIN B-1 100 MG PO TABS
100.0000 mg | ORAL_TABLET | Freq: Every day | ORAL | Status: DC
  Administered 2014-10-23 – 2014-10-25 (×3): 100 mg via ORAL
  Filled 2014-10-23 (×3): qty 1

## 2014-10-23 MED ORDER — SODIUM CHLORIDE 0.9 % IV SOLN
INTRAVENOUS | Status: DC
  Administered 2014-10-23: 01:00:00 via INTRAVENOUS

## 2014-10-23 MED ORDER — ADULT MULTIVITAMIN W/MINERALS CH
1.0000 | ORAL_TABLET | Freq: Every day | ORAL | Status: DC
  Administered 2014-10-23 – 2014-10-25 (×3): 1 via ORAL
  Filled 2014-10-23 (×3): qty 1

## 2014-10-23 MED ORDER — HYDROGEL GEL: 1.0000 "application " | Freq: Every day | Status: DC | PRN

## 2014-10-23 MED ORDER — ALBUTEROL SULFATE HFA 108 (90 BASE) MCG/ACT IN AERS: 2.0000 | INHALATION_SPRAY | Freq: Four times a day (QID) | RESPIRATORY_TRACT | Status: DC | PRN

## 2014-10-23 MED ORDER — NAPHAZOLINE-GLYCERIN-ZINC SULF 0.012-0.25-0.25 % OP SOLN: Freq: Three times a day (TID) | OPHTHALMIC | Status: DC | PRN

## 2014-10-23 MED ORDER — ONDANSETRON HCL 4 MG/2ML IJ SOLN: 4.0000 mg | Freq: Four times a day (QID) | INTRAMUSCULAR | Status: DC | PRN

## 2014-10-23 MED ORDER — PAROXETINE HCL 20 MG PO TABS
40.0000 mg | ORAL_TABLET | ORAL | Status: DC
  Filled 2014-10-23: qty 2

## 2014-10-23 MED ORDER — FOLIC ACID 1 MG PO TABS
1.0000 mg | ORAL_TABLET | Freq: Every day | ORAL | Status: DC
  Administered 2014-10-23 – 2014-10-25 (×3): 1 mg via ORAL
  Filled 2014-10-23 (×3): qty 1

## 2014-10-23 MED ORDER — GUAIFENESIN 100 MG/5ML PO SOLN: 200.0000 mg | Freq: Four times a day (QID) | ORAL | Status: DC | PRN

## 2014-10-23 MED ORDER — DOCUSATE SODIUM 100 MG PO CAPS
100.0000 mg | ORAL_CAPSULE | Freq: Two times a day (BID) | ORAL | Status: DC
  Administered 2014-10-23 – 2014-10-25 (×5): 100 mg via ORAL
  Filled 2014-10-23 (×8): qty 1

## 2014-10-23 MED ORDER — HYDROCODONE-ACETAMINOPHEN 5-325 MG PO TABS
1.0000 | ORAL_TABLET | Freq: Three times a day (TID) | ORAL | Status: DC
  Administered 2014-10-23 – 2014-10-25 (×8): 1 via ORAL
  Filled 2014-10-23 (×8): qty 1

## 2014-10-23 MED ORDER — CEFTRIAXONE SODIUM IN DEXTROSE 20 MG/ML IV SOLN
1.0000 g | INTRAVENOUS | Status: DC
  Administered 2014-10-23 – 2014-10-24 (×2): 1 g via INTRAVENOUS
  Filled 2014-10-23 (×3): qty 50

## 2014-10-23 MED ORDER — TEMAZEPAM 7.5 MG PO CAPS
7.5000 mg | ORAL_CAPSULE | Freq: Every day | ORAL | Status: DC
  Administered 2014-10-23 – 2014-10-24 (×3): 7.5 mg via ORAL
  Filled 2014-10-23 (×3): qty 1

## 2014-10-23 MED ORDER — ALBUTEROL SULFATE (2.5 MG/3ML) 0.083% IN NEBU: 2.5000 mg | INHALATION_SOLUTION | Freq: Four times a day (QID) | RESPIRATORY_TRACT | Status: DC | PRN

## 2014-10-23 MED ORDER — ONDANSETRON HCL 4 MG PO TABS: 4.0000 mg | ORAL_TABLET | Freq: Four times a day (QID) | ORAL | Status: DC | PRN

## 2014-10-23 MED ORDER — ALUM & MAG HYDROXIDE-SIMETH 200-200-20 MG/5ML PO SUSP: 30.0000 mL | Freq: Four times a day (QID) | ORAL | Status: DC | PRN

## 2014-10-23 MED ORDER — ACETAMINOPHEN 500 MG PO TABS: 500.0000 mg | ORAL_TABLET | Freq: Four times a day (QID) | ORAL | Status: DC | PRN

## 2014-10-23 MED ORDER — ACETAMINOPHEN 650 MG RE SUPP: 650.0000 mg | Freq: Four times a day (QID) | RECTAL | Status: DC | PRN

## 2014-10-23 NOTE — Plan of Care (Signed)
Problem: Consults Goal: Skin Care Protocol Initiated - if Braden Score 18 or less If consults are not indicated, leave blank or document N/A  Outcome: Not Met (add Reason) Patient has advanced care dementia with UTI which makes it difficulty for this patient to understand and follow commands with clarity  Problem: Phase II Progression Outcomes Goal: IV changed to normal saline lock Outcome: Not Met (add Reason) Patient receiving IV fluids for hydration

## 2014-10-23 NOTE — Progress Notes (Signed)
Pharmacy: Re- ceftriaxone  Patient's currently on ceftriaxone for suspected UTI. - afeb - wbc wnl - scr 1.30  - urine culture pending  Plan: - continue rocephin 1gm IV q24h - will sign off on rocephin as current dose is appropriate for indication and no renal adjustment is needed with this antibiotic - re-consult pharmacy if need further assistance  Dia Sitter, PharmD, BCPS 10/23/2014 10:07 AM

## 2014-10-23 NOTE — Progress Notes (Signed)
UR completed 

## 2014-10-23 NOTE — Progress Notes (Signed)
[facility] PROGRESS NOTE  PERL FOLMAR LOV:[id number] DOB: 09/26/1923 DOA: 10/22/2014 PCP: Sande Brothers, MD  Assessment/Plan: 1-UTI: no fever -continue rocephin -follow urine culture -WBC's WNL  2-toxic encephalopathy: on top of underlying dementia -will continue supportive care -continue tx for UTI  3-acute on chronic renal failure: CKD stage 3 at baseline -due to UTI and dehydration -will give IVF's -treat UTI -follow renal function  4-HTN: stable -continue current antihypertensive regimen  5-depression/anxiety: continue paxil  6-insomnia: continue restoril   Code Status: Full Family Communication: no family at bedside Disposition Plan: back to SNF once mentation improve and patient able to take PO antibiotics   Consultants:  None   Procedures:  See below for x-ray reports   Antibiotics:  Rocephin 4/05  HPI/Subjective: With sitter at bedside for personal safety; no CP, no SOB. Currently afebrile. Still very confused  Objective: Filed Vitals:   10/23/14 1433  BP: 130/61  Pulse: 79  Temp: 96.8 F (36 C)  Resp: 18    Intake/Output Summary (Last 24 hours) at 10/23/14 1758 Last data filed at 10/23/14 1247  Gross per 24 hour  Intake    240 ml  Output      0 ml  Net    240 ml   Filed Weights   10/23/14 0009  Weight: 74 kg (163 lb 2.3 oz)    Exam:   General:  Afebrile, very confused and having difficulties expressing herself   Cardiovascular: S1 and S2, no murmurs, no rubs or gallops; no JVD  Respiratory: CTA bilaterally  Abdomen: soft, NT, ND, positive BS  Musculoskeletal: no edema, no cyanosis    Data Reviewed: Basic Metabolic Panel:  Recent Labs Lab 10/22/14 1532 10/23/14 0543  NA 140 143  K 4.8 4.6  CL 104 108  CO2 26 29  GLUCOSE 94 84  BUN 37* 32*  CREATININE 1.46* 1.30*  CALCIUM 8.9 8.7   Liver Function Tests:  Recent Labs Lab 10/22/14 1532 10/23/14 0543  AST 20 21  ALT 20 18  ALKPHOS 59 53  BILITOT  0.5 0.4  PROT 6.1 5.6*  ALBUMIN 3.9 3.5   CBC:  Recent Labs Lab 10/22/14 1532 10/23/14 0543  WBC 8.4 7.3  HGB 13.8 13.1  HCT 44.0 42.8  MCV 93.8 93.2  PLT 216 220   BNP (last 3 results)  Recent Labs  10/08/14 0255  BNP 34.1   CBG:  Recent Labs Lab 10/22/14 1533 10/23/14 0720  GLUCAP 90 88   Studies: No results found.  Scheduled Meds: . cefTRIAXone (ROCEPHIN)  IV  1 g Intravenous Q24H  . cholecalciferol  1,000 Units Oral Q breakfast  . docusate sodium  100 mg Oral BID  . folic acid  1 mg Oral Daily  . heparin  5,000 Units Subcutaneous 3 times per day  . HYDROcodone-acetaminophen  1 tablet Oral TID  . meloxicam  7.5 mg Oral Q breakfast  . multivitamin with minerals  1 tablet Oral Daily  . PARoxetine  40 mg Oral Daily  . temazepam  7.5 mg Oral QHS  . thiamine  100 mg Oral Daily   Continuous Infusions: . sodium chloride 50 mL/hr at 10/23/14 0036    Principal Problem:   UTI (urinary tract infection) Active Problems:   Alzheimer's dementia   Hypertension   Altered mental state    Time spent: 30 minutes    Barton Dubois  [facility] Pager [phone]. If 7PM-7AM, please contact night-coverage at [email or web address], password [facility] 10/23/2014, 5:58 PM  LOS: 0 days

## 2014-10-23 NOTE — Progress Notes (Signed)
ANTIBIOTIC CONSULT NOTE - INITIAL  Pharmacy Consult for Ceftriaxone Indication: UTI  Allergies  Allergen Reactions  . Codeine     Makes her ill    Patient Measurements: Weight: 163 lb 2.3 oz (74 kg) Adjusted Body Weight:   Vital Signs: Temp: 98.1 F (36.7 C) (04/06 0009) Temp Source: Oral (04/06 0009) BP: 147/73 mmHg (04/06 0009) Pulse Rate: 85 (04/06 0009) Intake/Output from previous day: 04/05 0701 - 04/06 0700 In: -  Out: 200 [Urine:200] Intake/Output from this shift:    Labs:  Recent Labs  10/22/14 1532  WBC 8.4  HGB 13.8  PLT 216  CREATININE 1.46*   CrCl cannot be calculated (Unknown ideal weight.). No results for input(s): VANCOTROUGH, VANCOPEAK, VANCORANDOM, GENTTROUGH, GENTPEAK, GENTRANDOM, TOBRATROUGH, TOBRAPEAK, TOBRARND, AMIKACINPEAK, AMIKACINTROU, AMIKACIN in the last 72 hours.   Microbiology: Recent Results (from the past 720 hour(s))  Urine culture     Status: None   Collection Time: 10/07/14 11:32 PM  Result Value Ref Range Status   Specimen Description URINE, CLEAN CATCH  Final   Special Requests NONE  Final   Colony Count   Final    >=100,000 COLONIES/ML Performed at [facility]    Culture   Final    ESCHERICHIA COLI Performed at [facility]    Report Status 10/10/2014 FINAL  Final   Organism ID, Bacteria ESCHERICHIA COLI  Final      Susceptibility   Escherichia coli - MIC*    AMPICILLIN 4 SENSITIVE Sensitive     CEFAZOLIN <=4 SENSITIVE Sensitive     CEFTRIAXONE <=1 SENSITIVE Sensitive     CIPROFLOXACIN <=0.25 SENSITIVE Sensitive     GENTAMICIN <=1 SENSITIVE Sensitive     LEVOFLOXACIN <=0.12 SENSITIVE Sensitive     NITROFURANTOIN <=16 SENSITIVE Sensitive     TOBRAMYCIN <=1 SENSITIVE Sensitive     TRIMETH/SULFA <=20 SENSITIVE Sensitive     PIP/TAZO <=4 SENSITIVE Sensitive     * ESCHERICHIA COLI  MRSA PCR Screening     Status: None   Collection Time: 10/08/14  3:39 AM  Result Value Ref Range Status   MRSA by  PCR NEGATIVE NEGATIVE Final    Comment:        The GeneXpert MRSA Assay (FDA approved for NASAL specimens only), is one component of a comprehensive MRSA colonization surveillance program. It is not intended to diagnose MRSA infection nor to guide or monitor treatment for MRSA infections.     Medical History: Past Medical History  Diagnosis Date  . Hypertension   . Arthritis   . Alzheimer's dementia   . Anxiety   . Depression     Medications:  Anti-infectives    Start     Dose/Rate Route Frequency Ordered Stop   10/23/14 1800  cefTRIAXone (ROCEPHIN) 1 g in dextrose 5 % 50 mL IVPB - Premix     1 g 100 mL/hr over 30 Minutes Intravenous Every 24 hours 10/23/14 0023     10/22/14 1730  cefTRIAXone (ROCEPHIN) 1 g in dextrose 5 % 50 mL IVPB     1 g 100 mL/hr over 30 Minutes Intravenous  Once 10/22/14 1720 10/22/14 1809   10/22/14 0000  sulfamethoxazole-trimethoprim (SEPTRA DS) 800-160 MG per tablet     1 tablet Oral Every 12 hours 10/22/14 1816 10/25/14 2359     Assessment: Patient with UTI.  First dose of antibiotics already given in ED.  Goal of Therapy:  Rocephin based on manufacturer dosing recommendations.  Plan: Ceftriaxone 1gm iv  q24hr Follow up culture results  Nani Skillern Crowford 10/23/2014,2:45 AM

## 2014-10-24 DIAGNOSIS — G9340 Encephalopathy, unspecified: Secondary | ICD-10-CM

## 2014-10-24 LAB — HEMOGLOBIN A1C
Hgb A1c MFr Bld: 5.4 % (ref 4.8–5.6)
Mean Plasma Glucose: 108 mg/dL

## 2014-10-24 LAB — GLUCOSE, CAPILLARY: GLUCOSE-CAPILLARY: 84 mg/dL (ref 70–99)

## 2014-10-24 MED ORDER — HALOPERIDOL LACTATE 5 MG/ML IJ SOLN
0.5000 mg | Freq: Three times a day (TID) | INTRAMUSCULAR | Status: DC | PRN
  Administered 2014-10-24: 0.5 mg via INTRAVENOUS
  Filled 2014-10-24: qty 1

## 2014-10-24 NOTE — Progress Notes (Signed)
[facility] PROGRESS NOTE  Laura [name] SWH:[id number] DOB: 09-04-1923 DOA: 10/22/2014 PCP: Sande Brothers, MD  Assessment/Plan: 1-Gram neg rods UTI: no fever and WBC's WNL -continue rocephin while waiting sensitivity  -no dysuria -WBC's WNL  2-toxic encephalopathy: on top of underlying dementia -will continue supportive care -continue tx for UTI -improving and better  3-acute on chronic renal failure: CKD stage 3 at baseline -due to UTI and dehydration -Cr improving; patient eating and drinking better -will continue gentle IVF's resuscitation -treat UTI -follow renal function in am  4-HTN: stable -continue current antihypertensive regimen  5-depression/anxiety: continue paxil  6-insomnia: continue restoril   Code Status: Full Family Communication: no family at bedside Disposition Plan: back to SNF once mentation improve and patient able to take PO antibiotics   Consultants:  None   Procedures:  See below for x-ray reports   Antibiotics:  Rocephin 4/05  HPI/Subjective: Feeling better, more alert and oriented. No nausea, no vomiting. Afebrile   Objective: Filed Vitals:   10/24/14 1445  BP: 155/85  Pulse: 90  Temp: 98.6 F (37 C)  Resp: 18    Intake/Output Summary (Last 24 hours) at 10/24/14 1446 Last data filed at 10/24/14 1246  Gross per 24 hour  Intake   2140 ml  Output      0 ml  Net   2140 ml   Filed Weights   10/23/14 0009  Weight: 74 kg (163 lb 2.3 oz)    Exam:   General:  Afebrile, AAOX2 and was able to follow commands. No CP or SOB  Cardiovascular: S1 and S2, no murmurs, no rubs or gallops; no JVD  Respiratory: CTA bilaterally  Abdomen: soft, NT, ND, positive BS  Musculoskeletal: no edema, no cyanosis    Data Reviewed: Basic Metabolic Panel:  Recent Labs Lab 10/22/14 1532 10/23/14 0543  NA 140 143  K 4.8 4.6  CL 104 108  CO2 26 29  GLUCOSE 94 84  BUN 37* 32*  CREATININE 1.46* 1.30*  CALCIUM 8.9 8.7    Liver Function Tests:  Recent Labs Lab 10/22/14 1532 10/23/14 0543  AST 20 21  ALT 20 18  ALKPHOS 59 53  BILITOT 0.5 0.4  PROT 6.1 5.6*  ALBUMIN 3.9 3.5   CBC:  Recent Labs Lab 10/22/14 1532 10/23/14 0543  WBC 8.4 7.3  HGB 13.8 13.1  HCT 44.0 42.8  MCV 93.8 93.2  PLT 216 220   BNP (last 3 results)  Recent Labs  10/08/14 0255  BNP 34.1   CBG:  Recent Labs Lab 10/22/14 1533 10/23/14 0720 10/24/14 0734  GLUCAP 90 88 84   Studies: No results found.  Scheduled Meds: . cefTRIAXone (ROCEPHIN)  IV  1 g Intravenous Q24H  . cholecalciferol  1,000 Units Oral Q breakfast  . docusate sodium  100 mg Oral BID  . folic acid  1 mg Oral Daily  . heparin  5,000 Units Subcutaneous 3 times per day  . HYDROcodone-acetaminophen  1 tablet Oral TID  . meloxicam  7.5 mg Oral Q breakfast  . multivitamin with minerals  1 tablet Oral Daily  . PARoxetine  40 mg Oral Daily  . temazepam  7.5 mg Oral QHS  . thiamine  100 mg Oral Daily   Continuous Infusions: . sodium chloride 50 mL/hr at 10/23/14 0036    Principal Problem:   UTI (urinary tract infection) Active Problems:   Alzheimer's dementia   Hypertension   Altered mental state    Time spent: 30 minutes  Barton Dubois  [facility] Pager [phone]. If 7PM-7AM, please contact night-coverage at [email or web address], password [facility] 10/24/2014, 2:46 PM  LOS: 1 day

## 2014-10-25 DIAGNOSIS — N179 Acute kidney failure, unspecified: Secondary | ICD-10-CM | POA: Insufficient documentation

## 2014-10-25 DIAGNOSIS — N183 Chronic kidney disease, stage 3 unspecified: Secondary | ICD-10-CM | POA: Insufficient documentation

## 2014-10-25 LAB — CBC
HCT: 41.3 % (ref 36.0–46.0)
Hemoglobin: 13.3 g/dL (ref 12.0–15.0)
MCH: 29.8 pg (ref 26.0–34.0)
MCHC: 32.2 g/dL (ref 30.0–36.0)
MCV: 92.6 fL (ref 78.0–100.0)
PLATELETS: 222 10*3/uL (ref 150–400)
RBC: 4.46 MIL/uL (ref 3.87–5.11)
RDW: 13.5 % (ref 11.5–15.5)
WBC: 6.6 10*3/uL (ref 4.0–10.5)

## 2014-10-25 LAB — BASIC METABOLIC PANEL
Anion gap: 8 (ref 5–15)
BUN: 22 mg/dL (ref 6–23)
CHLORIDE: 107 mmol/L (ref 96–112)
CO2: 27 mmol/L (ref 19–32)
Calcium: 8.5 mg/dL (ref 8.4–10.5)
Creatinine, Ser: 1.16 mg/dL — ABNORMAL HIGH (ref 0.50–1.10)
GFR calc non Af Amer: 41 mL/min — ABNORMAL LOW (ref >90)
GFR, EST AFRICAN AMERICAN: 47 mL/min — AB (ref >90)
GLUCOSE: 86 mg/dL (ref 70–99)
Potassium: 4.4 mmol/L (ref 3.5–5.1)
SODIUM: 142 mmol/L (ref 135–145)

## 2014-10-25 LAB — GLUCOSE, CAPILLARY: GLUCOSE-CAPILLARY: 83 mg/dL (ref 70–99)

## 2014-10-25 MED ORDER — HYDROCODONE-ACETAMINOPHEN 5-325 MG PO TABS: 1.0000 | ORAL_TABLET | Freq: Three times a day (TID) | ORAL | Status: DC | PRN

## 2014-10-25 MED ORDER — THERA VITAL M PO TABS: 1.0000 | ORAL_TABLET | Freq: Every day | ORAL | Status: DC

## 2014-10-25 MED ORDER — TEMAZEPAM 7.5 MG PO CAPS: 7.5000 mg | ORAL_CAPSULE | Freq: Every day | ORAL | Status: DC

## 2014-10-25 MED ORDER — CEFUROXIME AXETIL 500 MG PO TABS
500.0000 mg | ORAL_TABLET | Freq: Two times a day (BID) | ORAL | Status: DC
  Administered 2014-10-25: 500 mg via ORAL
  Filled 2014-10-25 (×2): qty 1

## 2014-10-25 MED ORDER — CEFUROXIME AXETIL 500 MG PO TABS: 500.0000 mg | ORAL_TABLET | Freq: Two times a day (BID) | ORAL | Status: AC

## 2014-10-25 NOTE — Progress Notes (Signed)
Pt left at this time with [facility]. Pt going back to [facility]. Pt alert.

## 2014-10-25 NOTE — Discharge Summary (Addendum)
Physician Discharge Summary  Laura [name] OYD:[id number] DOB: [birth year] DOA: 10/22/2014  PCP: Sande Brothers, MD  Admit date: 10/22/2014 Discharge date: 10/25/2014  Time spent:>30 minutes  Recommendations for Outpatient Follow-up:  1. Repeat BMET to follow electrolytes and renal function 2. Follow BP and if needed start antihypertensive regimen 3. Follow final urine culture and if needed adjust antibiotic therapy; patient transition to cefuroxime given response to rocephin while inpatient.  Discharge Diagnoses:  Principal Problem:   UTI (urinary tract infection) Active Problems:   Alzheimer's dementia   Hypertension   Altered mental state acute on chronic renal failure (stage 3 at baseline) Depression/anxiety  Insomnia   Discharge Condition: stable and improved. No fever or dysuria. Discharge on PO antibiotics to complete therapy. Patient back to [facility] for further care and assistance  Diet recommendation: heart healthy diet   Filed Weights   10/23/14 0009  Weight: 74 kg (163 lb 2.3 oz)    History of present illness:  81 y.o. female presents with altered mental status. Patient is not able to provide any history. Patient was transferred from a SNF. She had been recently admitted with a UTI. Patient was noted to be confused and agitated in the ED. Originally she was slated to be discharged however she became more confused while here in the ED. She had been reported to be alert an oriented. Patient was given some benadryl and now seems to be doing better and is more calm.  Hospital Course:  1-Gram neg rods UTI: no fever  -will discharge on ceftin BID  -no dysuria or increase frequency -WBC's WNL  2-toxic encephalopathy: on top of underlying dementia -will continue supportive care -finalize tx for UTI -improving and back to baseline  3-acute on chronic renal failure: CKD stage 3 at baseline -due to UTI and dehydration -patient advise to maintain adequate  hydration -Cr improving/back to baseline at discharge -will continue gentle IVF's resuscitation -continue tx for UTI -follow renal function during follow up visit  4-HTN: overall stable -was not taking any meds prior to admission  -patient instructed to follow heart healthy diet  5-depression/anxiety: continue paxil  6-insomnia: continue restoril   *Rest of medical problems remains stable and the plan is to continue current medication regimen. Follow up with PCP for HFU visit and further medications adjustment to be arrange in apporx 10 days  Procedures:  See below for x-ray reports   Consultations:  None   Discharge Exam: Filed Vitals:   10/25/14 0542  BP: 143/69  Pulse: 82  Temp: 98.2 F (36.8 C)  Resp: 20    General: Afebrile, AAOX2 and able to follow commands. No CP or SOB  Cardiovascular: S1 and S2, no murmurs, no rubs or gallops; no JVD  Respiratory: CTA bilaterally  Abdomen: soft, NT, ND, positive BS  Musculoskeletal: no edema, no cyanosis   Discharge Instructions   Discharge Instructions    Diet - low sodium heart healthy    Complete by:  As directed      Discharge instructions    Complete by:  As directed   Take medications as prescribed Please make sure patient maintain good hydration and nutrition Antibiotics to be taken for 5 more days Arrange follow up with PCP in 10 days          Current Discharge Medication List    START taking these medications   Details  cefUROXime (CEFTIN) 500 MG tablet Take 1 tablet (500 mg total) by mouth 2 (two) times daily with  a meal. Qty: 10 tablet, Refills: 0    Multiple Vitamins-Minerals (MULTIVITAMIN) tablet Take 1 tablet by mouth daily.      CONTINUE these medications which have CHANGED   Details  HYDROcodone-acetaminophen (NORCO/VICODIN) 5-325 MG per tablet Take 1 tablet by mouth every 8 (eight) hours as needed for moderate pain. Qty: 20 tablet, Refills: 0    temazepam (RESTORIL) 7.5 MG capsule  Take 1 capsule (7.5 mg total) by mouth at bedtime. Qty: 20 capsule, Refills: 0      CONTINUE these medications which have NOT CHANGED   Details  acetaminophen (MAPAP) 500 MG tablet Take 500 mg by mouth every 6 (six) hours as needed for mild pain, fever or headache.     albuterol (PROVENTIL HFA;VENTOLIN HFA) 108 (90 BASE) MCG/ACT inhaler Inhale 2 puffs into the lungs every 6 (six) hours as needed for wheezing or shortness of breath. Qty: 1 Inhaler, Refills: 2    alum & mag hydroxide-simeth (MI-ACID MAXIMUM STRENGTH) 400-400-40 MG/5ML suspension Take 30 mLs by mouth every 6 (six) hours as needed for indigestion.     Carbomer Gel Base (HYDROGEL) GEL Apply 1 application topically daily as needed (for rash). Apply to face    cholecalciferol (VITAMIN D) 1000 UNITS tablet Take 1,000 Units by mouth daily with breakfast.     guaifenesin (Q-TUSSIN) 100 MG/5ML syrup Take 200 mg by mouth every 6 (six) hours as needed for cough.     loperamide (IMODIUM) 2 MG capsule Take 2 mg by mouth as needed for diarrhea or loose stools. NOT TO EXCEED 8 DOSES IN 24 HR    magnesium hydroxide (MILK OF MAGNESIA) 400 MG/5ML suspension Take 30 mLs by mouth daily as needed for mild constipation.     meloxicam (MOBIC) 7.5 MG tablet Take 7.5 mg by mouth daily with breakfast.    Naphazoline-Glycerin-Zinc Sulf (CLEAR EYES MAXIMUM ITCHY EYE OP) Place 1-2 drops into both eyes 3 (three) times daily as needed (for itching and redness). Wait 3 to 5 minutes between each drop    Neomycin-Bacitracin-Polymyxin (HCA TRIPLE ANTIBIOTIC OINTMENT EX) Apply 1 application topically as needed (for wound care).     NUTRITIONAL SUPPLEMENT LIQD Take 1 Bottle by mouth 3 (three) times daily.    PARoxetine (PAXIL) 40 MG tablet Take 40 mg by mouth every morning.    polyethylene glycol (MIRALAX / GLYCOLAX) packet Take 17 g by mouth daily as needed for mild constipation.       STOP taking these medications     cephALEXin (KEFLEX) 500 MG  capsule        Allergies  Allergen Reactions  . Codeine     Makes her ill   Follow-up Information    Follow up with [facility], [place]. Schedule an appointment as soon as possible for a visit in 10 days.   Specialty:  Internal Medicine   Why:  for hospital follow up      The results of significant diagnostics from this hospitalization (including imaging, microbiology, ancillary and laboratory) are listed below for reference.    Significant Diagnostic Studies: Dg Thoracic Spine 2 View  10/07/2014   CLINICAL DATA:  Status post fall at nursing home, with upper back pain. Initial encounter.  EXAM: THORACIC SPINE - 2 VIEW  COMPARISON:  Chest radiograph performed 06/20/2014  FINDINGS: There is no evidence of acute fracture or subluxation. Chronic compression deformities are noted at vertebral bodies T8, T9 and T12, with associated changes of vertebroplasty. Vertebral bodies demonstrate normal height and alignment. Intervertebral  disc spaces are preserved.  The visualized portions of both lungs are clear. The mediastinum is unremarkable in appearance. The patient's left shoulder arthroplasty is grossly unremarkable in appearance.  IMPRESSION: 1. No evidence of acute fracture or subluxation along the thoracic spine. 2. Chronic compression deformities at T8, T9 and T12, with associated changes of vertebroplasty.   Electronically Signed   By: Garald Balding M.D.   On: 10/07/2014 22:51   Ct Head Wo Contrast  10/07/2014   CLINICAL DATA:  Status post fall. Concern for head or cervical spine injury. Initial encounter.  EXAM: CT HEAD WITHOUT CONTRAST  CT CERVICAL SPINE WITHOUT CONTRAST  TECHNIQUE: Multidetector CT imaging of the head and cervical spine was performed following the standard protocol without intravenous contrast. Multiplanar CT image reconstructions of the cervical spine were also generated.  COMPARISON:  CT of the head and cervical spine performed 06/20/2014  FINDINGS: CT HEAD FINDINGS  There is  no evidence of acute infarction, mass lesion, or intra- or extra-axial hemorrhage on CT.  Prominence of the ventricles and sulci reflects moderate cortical volume loss. Scattered periventricular and subcortical white matter change likely reflects small vessel ischemic microangiopathy. Mild cerebellar atrophy is noted.  The brainstem and fourth ventricle are within normal limits. The basal ganglia are unremarkable in appearance. The cerebral hemispheres demonstrate grossly normal gray-white differentiation. No mass effect or midline shift is seen.  There is no evidence of fracture; visualized osseous structures are unremarkable in appearance. The orbits are within normal limits. The paranasal sinuses and mastoid air cells are well-aerated. No significant soft tissue abnormalities are seen.  CT CERVICAL SPINE FINDINGS  There is no evidence of fracture or subluxation. Vertebral bodies demonstrate normal height and alignment. Mild disc space narrowing is noted at C5-C6, with small anterior and posterior disc osteophyte complexes. Prevertebral soft tissues are within normal limits.  A large 3.0 cm nodule is noted at the right thyroid lobe. This appears to have been biopsied in 2003. Emphysematous change is noted at the lung apices. Mild calcification is seen at the carotid bifurcations bilaterally.  IMPRESSION: 1. No evidence of traumatic intracranial injury or fracture. 2. No evidence of fracture or subluxation along the cervical spine. 3. Moderate cortical volume loss and scattered small vessel ischemic microangiopathy. 4. Emphysematous change noted at the lung apices. 5. Mild calcification at the carotid bifurcations bilaterally. 6. Minimal degenerative change at the lower cervical spine. 7. 3.0 cm right thyroid nodule appears to have been biopsied in 2003. Would correlate with prior biopsy results.   Electronically Signed   By: Garald Balding M.D.   On: 10/07/2014 23:24   Ct Cervical Spine Wo Contrast  10/07/2014    CLINICAL DATA:  Status post fall. Concern for head or cervical spine injury. Initial encounter.  EXAM: CT HEAD WITHOUT CONTRAST  CT CERVICAL SPINE WITHOUT CONTRAST  TECHNIQUE: Multidetector CT imaging of the head and cervical spine was performed following the standard protocol without intravenous contrast. Multiplanar CT image reconstructions of the cervical spine were also generated.  COMPARISON:  CT of the head and cervical spine performed 06/20/2014  FINDINGS: CT HEAD FINDINGS  There is no evidence of acute infarction, mass lesion, or intra- or extra-axial hemorrhage on CT.  Prominence of the ventricles and sulci reflects moderate cortical volume loss. Scattered periventricular and subcortical white matter change likely reflects small vessel ischemic microangiopathy. Mild cerebellar atrophy is noted.  The brainstem and fourth ventricle are within normal limits. The basal ganglia are unremarkable in  appearance. The cerebral hemispheres demonstrate grossly normal gray-white differentiation. No mass effect or midline shift is seen.  There is no evidence of fracture; visualized osseous structures are unremarkable in appearance. The orbits are within normal limits. The paranasal sinuses and mastoid air cells are well-aerated. No significant soft tissue abnormalities are seen.  CT CERVICAL SPINE FINDINGS  There is no evidence of fracture or subluxation. Vertebral bodies demonstrate normal height and alignment. Mild disc space narrowing is noted at C5-C6, with small anterior and posterior disc osteophyte complexes. Prevertebral soft tissues are within normal limits.  A large 3.0 cm nodule is noted at the right thyroid lobe. This appears to have been biopsied in 2003. Emphysematous change is noted at the lung apices. Mild calcification is seen at the carotid bifurcations bilaterally.  IMPRESSION: 1. No evidence of traumatic intracranial injury or fracture. 2. No evidence of fracture or subluxation along the cervical  spine. 3. Moderate cortical volume loss and scattered small vessel ischemic microangiopathy. 4. Emphysematous change noted at the lung apices. 5. Mild calcification at the carotid bifurcations bilaterally. 6. Minimal degenerative change at the lower cervical spine. 7. 3.0 cm right thyroid nodule appears to have been biopsied in 2003. Would correlate with prior biopsy results.   Electronically Signed   By: Garald Balding M.D.   On: 10/07/2014 23:24   Dg Chest Port 1 View  10/08/2014   CLINICAL DATA:  Hypoxia  EXAM: PORTABLE CHEST - 1 VIEW  COMPARISON:  06/20/2014  FINDINGS: There are low lung volumes. There is extensive emphysematous and fibrotic appearing change without evidence of a superimposed acute infiltrate or congestive heart failure. No large effusion. Heart size is normal and unchanged. Hilar and mediastinal contours are unchanged.  IMPRESSION: COPD.  No acute cardiopulmonary findings.   Electronically Signed   By: Andreas Newport M.D.   On: 10/08/2014 00:13    Microbiology: Recent Results (from the past 240 hour(s))  Urine culture     Status: None (Preliminary result)   Collection Time: 10/22/14  4:23 PM  Result Value Ref Range Status   Specimen Description URINE, CATHETERIZED  Final   Special Requests NONE  Final   Colony Count   Final    >=100,000 COLONIES/ML Performed at [facility]    Culture   Final    Center Performed at [facility]    Report Status PENDING  Incomplete     Labs: Basic Metabolic Panel:  Recent Labs Lab 10/22/14 1532 10/23/14 0543 10/25/14 0539  NA 140 143 142  K 4.8 4.6 4.4  CL 104 108 107  CO2 26 29 27   GLUCOSE 94 84 86  BUN 37* 32* 22  CREATININE 1.46* 1.30* 1.16*  CALCIUM 8.9 8.7 8.5   Liver Function Tests:  Recent Labs Lab 10/22/14 1532 10/23/14 0543  AST 20 21  ALT 20 18  ALKPHOS 59 53  BILITOT 0.5 0.4  PROT 6.1 5.6*  ALBUMIN 3.9 3.5   CBC:  Recent Labs Lab 10/22/14 1532 10/23/14 0543  10/25/14 0539  WBC 8.4 7.3 6.6  HGB 13.8 13.1 13.3  HCT 44.0 42.8 41.3  MCV 93.8 93.2 92.6  PLT 216 220 222   BNP (last 3 results)  Recent Labs  10/08/14 0255  BNP 34.1    CBG:  Recent Labs Lab 10/22/14 1533 10/23/14 0720 10/24/14 0734 10/25/14 0731  GLUCAP 90 88 84 83    Signed:  Barton Dubois  [facility] 10/25/2014, 11:07 AM

## 2014-10-25 NOTE — Progress Notes (Signed)
CSW met with Pt to discuss discharge planning. Pt is agreeable to returning to [facility] today. She declined contact to family or friends. She described being ready to get back to her residence as she fas friends in her ALF community.   CSW spoke with Jenny Reichmann at [facility] who is agreeable to have Pt back today, requesting transport at 4:00pm. CSW agreeable.   [facility] transportation scheduled for 4:00pm. DC packet completed and placed on chart. RN notified.   Peri Maris, LCSWA 10/25/2014 2:38 PM [phone]

## 2014-10-25 NOTE — Progress Notes (Signed)
Writer gave report to [employer].

## 2014-10-28 LAB — URINE CULTURE

## 2014-10-29 DIAGNOSIS — I10 Essential (primary) hypertension: Secondary | ICD-10-CM | POA: Diagnosis not present

## 2014-10-29 DIAGNOSIS — N183 Chronic kidney disease, stage 3 (moderate): Secondary | ICD-10-CM | POA: Diagnosis not present

## 2014-10-29 DIAGNOSIS — G309 Alzheimer's disease, unspecified: Secondary | ICD-10-CM | POA: Diagnosis not present

## 2014-10-29 DIAGNOSIS — N390 Urinary tract infection, site not specified: Secondary | ICD-10-CM | POA: Diagnosis not present

## 2014-11-01 DIAGNOSIS — I10 Essential (primary) hypertension: Secondary | ICD-10-CM | POA: Diagnosis not present

## 2014-11-01 DIAGNOSIS — G309 Alzheimer's disease, unspecified: Secondary | ICD-10-CM | POA: Diagnosis not present

## 2014-11-03 DIAGNOSIS — F419 Anxiety disorder, unspecified: Secondary | ICD-10-CM | POA: Diagnosis not present

## 2014-11-03 DIAGNOSIS — J449 Chronic obstructive pulmonary disease, unspecified: Secondary | ICD-10-CM | POA: Diagnosis not present

## 2014-11-03 DIAGNOSIS — G309 Alzheimer's disease, unspecified: Secondary | ICD-10-CM | POA: Diagnosis not present

## 2014-11-03 DIAGNOSIS — F0280 Dementia in other diseases classified elsewhere without behavioral disturbance: Secondary | ICD-10-CM | POA: Diagnosis not present

## 2014-11-03 DIAGNOSIS — N390 Urinary tract infection, site not specified: Secondary | ICD-10-CM | POA: Diagnosis not present

## 2014-11-03 DIAGNOSIS — I10 Essential (primary) hypertension: Secondary | ICD-10-CM | POA: Diagnosis not present

## 2014-11-04 DIAGNOSIS — I10 Essential (primary) hypertension: Secondary | ICD-10-CM | POA: Diagnosis not present

## 2014-11-04 DIAGNOSIS — J449 Chronic obstructive pulmonary disease, unspecified: Secondary | ICD-10-CM | POA: Diagnosis not present

## 2014-11-04 DIAGNOSIS — F0280 Dementia in other diseases classified elsewhere without behavioral disturbance: Secondary | ICD-10-CM | POA: Diagnosis not present

## 2014-11-04 DIAGNOSIS — F419 Anxiety disorder, unspecified: Secondary | ICD-10-CM | POA: Diagnosis not present

## 2014-11-04 DIAGNOSIS — N390 Urinary tract infection, site not specified: Secondary | ICD-10-CM | POA: Diagnosis not present

## 2014-11-04 DIAGNOSIS — G309 Alzheimer's disease, unspecified: Secondary | ICD-10-CM | POA: Diagnosis not present

## 2014-11-05 ENCOUNTER — Inpatient Hospital Stay (HOSPITAL_COMMUNITY)
Admission: EM | Admit: 2014-11-05 | Discharge: 2014-11-11 | DRG: 469 | Disposition: A | Discharge status: Skilled Nursing Facility | Payer: Medicare Other | Attending: Internal Medicine | Admitting: Internal Medicine

## 2014-11-05 ENCOUNTER — Encounter (HOSPITAL_COMMUNITY): Payer: Self-pay | Admitting: Emergency Medicine

## 2014-11-05 ENCOUNTER — Other Ambulatory Visit (HOSPITAL_COMMUNITY): Payer: Self-pay

## 2014-11-05 ENCOUNTER — Emergency Department (HOSPITAL_COMMUNITY): Payer: Medicare Other

## 2014-11-05 DIAGNOSIS — F329 Major depressive disorder, single episode, unspecified: Secondary | ICD-10-CM | POA: Diagnosis present

## 2014-11-05 DIAGNOSIS — I10 Essential (primary) hypertension: Secondary | ICD-10-CM | POA: Diagnosis present

## 2014-11-05 DIAGNOSIS — I447 Left bundle-branch block, unspecified: Secondary | ICD-10-CM | POA: Diagnosis present

## 2014-11-05 DIAGNOSIS — S72002A Fracture of unspecified part of neck of left femur, initial encounter for closed fracture: Secondary | ICD-10-CM | POA: Diagnosis not present

## 2014-11-05 DIAGNOSIS — Y92129 Unspecified place in nursing home as the place of occurrence of the external cause: Secondary | ICD-10-CM | POA: Diagnosis not present

## 2014-11-05 DIAGNOSIS — T148 Other injury of unspecified body region: Secondary | ICD-10-CM | POA: Diagnosis not present

## 2014-11-05 DIAGNOSIS — F419 Anxiety disorder, unspecified: Secondary | ICD-10-CM | POA: Diagnosis present

## 2014-11-05 DIAGNOSIS — J449 Chronic obstructive pulmonary disease, unspecified: Secondary | ICD-10-CM | POA: Diagnosis present

## 2014-11-05 DIAGNOSIS — S72002D Fracture of unspecified part of neck of left femur, subsequent encounter for closed fracture with routine healing: Secondary | ICD-10-CM | POA: Diagnosis not present

## 2014-11-05 DIAGNOSIS — E875 Hyperkalemia: Secondary | ICD-10-CM | POA: Diagnosis not present

## 2014-11-05 DIAGNOSIS — D259 Leiomyoma of uterus, unspecified: Secondary | ICD-10-CM | POA: Diagnosis not present

## 2014-11-05 DIAGNOSIS — R296 Repeated falls: Secondary | ICD-10-CM | POA: Diagnosis present

## 2014-11-05 DIAGNOSIS — R0902 Hypoxemia: Secondary | ICD-10-CM | POA: Diagnosis present

## 2014-11-05 DIAGNOSIS — M1990 Unspecified osteoarthritis, unspecified site: Secondary | ICD-10-CM | POA: Diagnosis present

## 2014-11-05 DIAGNOSIS — F0390 Unspecified dementia without behavioral disturbance: Secondary | ICD-10-CM | POA: Diagnosis not present

## 2014-11-05 DIAGNOSIS — D62 Acute posthemorrhagic anemia: Secondary | ICD-10-CM | POA: Diagnosis not present

## 2014-11-05 DIAGNOSIS — W19XXXA Unspecified fall, initial encounter: Secondary | ICD-10-CM | POA: Diagnosis present

## 2014-11-05 DIAGNOSIS — S72012A Unspecified intracapsular fracture of left femur, initial encounter for closed fracture: Principal | ICD-10-CM | POA: Diagnosis present

## 2014-11-05 DIAGNOSIS — Z87891 Personal history of nicotine dependence: Secondary | ICD-10-CM

## 2014-11-05 DIAGNOSIS — E079 Disorder of thyroid, unspecified: Secondary | ICD-10-CM | POA: Diagnosis not present

## 2014-11-05 DIAGNOSIS — E041 Nontoxic single thyroid nodule: Secondary | ICD-10-CM | POA: Diagnosis not present

## 2014-11-05 DIAGNOSIS — G309 Alzheimer's disease, unspecified: Secondary | ICD-10-CM | POA: Diagnosis present

## 2014-11-05 DIAGNOSIS — G9340 Encephalopathy, unspecified: Secondary | ICD-10-CM | POA: Diagnosis not present

## 2014-11-05 DIAGNOSIS — S8992XA Unspecified injury of left lower leg, initial encounter: Secondary | ICD-10-CM | POA: Diagnosis not present

## 2014-11-05 DIAGNOSIS — R278 Other lack of coordination: Secondary | ICD-10-CM | POA: Diagnosis not present

## 2014-11-05 DIAGNOSIS — D497 Neoplasm of unspecified behavior of endocrine glands and other parts of nervous system: Secondary | ICD-10-CM | POA: Diagnosis not present

## 2014-11-05 DIAGNOSIS — R1310 Dysphagia, unspecified: Secondary | ICD-10-CM | POA: Diagnosis not present

## 2014-11-05 DIAGNOSIS — Z419 Encounter for procedure for purposes other than remedying health state, unspecified: Secondary | ICD-10-CM

## 2014-11-05 DIAGNOSIS — M25559 Pain in unspecified hip: Secondary | ICD-10-CM | POA: Diagnosis not present

## 2014-11-05 DIAGNOSIS — R509 Fever, unspecified: Secondary | ICD-10-CM

## 2014-11-05 DIAGNOSIS — R918 Other nonspecific abnormal finding of lung field: Secondary | ICD-10-CM | POA: Diagnosis not present

## 2014-11-05 DIAGNOSIS — F0280 Dementia in other diseases classified elsewhere without behavioral disturbance: Secondary | ICD-10-CM | POA: Diagnosis not present

## 2014-11-05 DIAGNOSIS — E0789 Other specified disorders of thyroid: Secondary | ICD-10-CM | POA: Diagnosis present

## 2014-11-05 DIAGNOSIS — Z96642 Presence of left artificial hip joint: Secondary | ICD-10-CM | POA: Diagnosis not present

## 2014-11-05 DIAGNOSIS — M25552 Pain in left hip: Secondary | ICD-10-CM

## 2014-11-05 DIAGNOSIS — S72092A Other fracture of head and neck of left femur, initial encounter for closed fracture: Secondary | ICD-10-CM | POA: Diagnosis not present

## 2014-11-05 DIAGNOSIS — Z471 Aftercare following joint replacement surgery: Secondary | ICD-10-CM | POA: Diagnosis not present

## 2014-11-05 DIAGNOSIS — M25562 Pain in left knee: Secondary | ICD-10-CM | POA: Diagnosis not present

## 2014-11-05 DIAGNOSIS — J439 Emphysema, unspecified: Secondary | ICD-10-CM | POA: Diagnosis not present

## 2014-11-05 DIAGNOSIS — S0990XA Unspecified injury of head, initial encounter: Secondary | ICD-10-CM | POA: Diagnosis not present

## 2014-11-05 DIAGNOSIS — Z791 Long term (current) use of non-steroidal anti-inflammatories (NSAID): Secondary | ICD-10-CM

## 2014-11-05 DIAGNOSIS — D72829 Elevated white blood cell count, unspecified: Secondary | ICD-10-CM | POA: Diagnosis present

## 2014-11-05 DIAGNOSIS — M21252 Flexion deformity, left hip: Secondary | ICD-10-CM | POA: Diagnosis not present

## 2014-11-05 DIAGNOSIS — R221 Localized swelling, mass and lump, neck: Secondary | ICD-10-CM | POA: Diagnosis present

## 2014-11-05 DIAGNOSIS — Z96649 Presence of unspecified artificial hip joint: Secondary | ICD-10-CM

## 2014-11-05 DIAGNOSIS — M6281 Muscle weakness (generalized): Secondary | ICD-10-CM | POA: Diagnosis not present

## 2014-11-05 DIAGNOSIS — R0602 Shortness of breath: Secondary | ICD-10-CM | POA: Diagnosis not present

## 2014-11-05 DIAGNOSIS — R2681 Unsteadiness on feet: Secondary | ICD-10-CM | POA: Diagnosis not present

## 2014-11-05 DIAGNOSIS — S199XXA Unspecified injury of neck, initial encounter: Secondary | ICD-10-CM | POA: Diagnosis not present

## 2014-11-05 DIAGNOSIS — I7090 Unspecified atherosclerosis: Secondary | ICD-10-CM | POA: Diagnosis not present

## 2014-11-05 LAB — COMPREHENSIVE METABOLIC PANEL
ALK PHOS: 73 U/L (ref 39–117)
ALT: 16 U/L (ref 0–35)
ANION GAP: 7 (ref 5–15)
AST: 22 U/L (ref 0–37)
Albumin: 4.4 g/dL (ref 3.5–5.2)
BUN: 25 mg/dL — ABNORMAL HIGH (ref 6–23)
CO2: 28 mmol/L (ref 19–32)
Calcium: 8.9 mg/dL (ref 8.4–10.5)
Chloride: 104 mmol/L (ref 96–112)
Creatinine, Ser: 1.04 mg/dL (ref 0.50–1.10)
GFR calc Af Amer: 54 mL/min — ABNORMAL LOW (ref >90)
GFR calc non Af Amer: 47 mL/min — ABNORMAL LOW (ref >90)
Glucose, Bld: 106 mg/dL — ABNORMAL HIGH (ref 70–99)
Potassium: 3.9 mmol/L (ref 3.5–5.1)
Sodium: 139 mmol/L (ref 135–145)
Total Bilirubin: 0.6 mg/dL (ref 0.3–1.2)
Total Protein: 6.7 g/dL (ref 6.0–8.3)

## 2014-11-05 LAB — URINALYSIS, ROUTINE W REFLEX MICROSCOPIC
Bilirubin Urine: NEGATIVE
Glucose, UA: NEGATIVE mg/dL
Hgb urine dipstick: NEGATIVE
Ketones, ur: NEGATIVE mg/dL
Leukocytes, UA: NEGATIVE
NITRITE: NEGATIVE
PH: 7.5 (ref 5.0–8.0)
Protein, ur: 30 mg/dL — AB
SPECIFIC GRAVITY, URINE: 1.018 (ref 1.005–1.030)
UROBILINOGEN UA: 0.2 mg/dL (ref 0.0–1.0)

## 2014-11-05 LAB — CBC WITH DIFFERENTIAL/PLATELET
Basophils Absolute: 0 10*3/uL (ref 0.0–0.1)
Basophils Relative: 0 % (ref 0–1)
Eosinophils Absolute: 0 10*3/uL (ref 0.0–0.7)
Eosinophils Relative: 0 % (ref 0–5)
HEMATOCRIT: 47.2 % — AB (ref 36.0–46.0)
HEMOGLOBIN: 14.8 g/dL (ref 12.0–15.0)
LYMPHS PCT: 6 % — AB (ref 12–46)
Lymphs Abs: 0.9 10*3/uL (ref 0.7–4.0)
MCH: 29 pg (ref 26.0–34.0)
MCHC: 31.4 g/dL (ref 30.0–36.0)
MCV: 92.5 fL (ref 78.0–100.0)
MONO ABS: 0.7 10*3/uL (ref 0.1–1.0)
MONOS PCT: 4 % (ref 3–12)
NEUTROS ABS: 14.8 10*3/uL — AB (ref 1.7–7.7)
Neutrophils Relative %: 90 % — ABNORMAL HIGH (ref 43–77)
Platelets: 237 10*3/uL (ref 150–400)
RBC: 5.1 MIL/uL (ref 3.87–5.11)
RDW: 13.7 % (ref 11.5–15.5)
WBC: 16.5 10*3/uL — AB (ref 4.0–10.5)

## 2014-11-05 LAB — URINE MICROSCOPIC-ADD ON

## 2014-11-05 MED ORDER — TEMAZEPAM 7.5 MG PO CAPS: 7.5000 mg | ORAL_CAPSULE | Freq: Every day | ORAL | Status: DC

## 2014-11-05 MED ORDER — PAROXETINE HCL 20 MG PO TABS
40.0000 mg | ORAL_TABLET | Freq: Every day | ORAL | Status: DC
  Administered 2014-11-05 – 2014-11-11 (×7): 40 mg via ORAL
  Filled 2014-11-05 (×7): qty 2

## 2014-11-05 MED ORDER — POLYETHYLENE GLYCOL 3350 17 G PO PACK
17.0000 g | PACK | Freq: Every day | ORAL | Status: DC | PRN
  Filled 2014-11-05: qty 1

## 2014-11-05 MED ORDER — PAROXETINE HCL 20 MG PO TABS: 40.0000 mg | ORAL_TABLET | ORAL | Status: DC

## 2014-11-05 MED ORDER — SODIUM CHLORIDE 0.9 % IV SOLN
INTRAVENOUS | Status: DC
  Administered 2014-11-05: 15:00:00 via INTRAVENOUS

## 2014-11-05 MED ORDER — HEPARIN SODIUM (PORCINE) 5000 UNIT/ML IJ SOLN
5000.0000 [IU] | Freq: Three times a day (TID) | INTRAMUSCULAR | Status: AC
  Administered 2014-11-05 – 2014-11-06 (×2): 5000 [IU] via SUBCUTANEOUS
  Filled 2014-11-05 (×2): qty 1

## 2014-11-05 MED ORDER — BOOST PLUS PO LIQD
237.0000 mL | Freq: Two times a day (BID) | ORAL | Status: DC
  Administered 2014-11-06 – 2014-11-11 (×5): 237 mL via ORAL
  Filled 2014-11-05 (×15): qty 237

## 2014-11-05 MED ORDER — HYDROCODONE-ACETAMINOPHEN 5-325 MG PO TABS
1.0000 | ORAL_TABLET | Freq: Four times a day (QID) | ORAL | Status: DC | PRN
  Administered 2014-11-05 – 2014-11-06 (×2): 1 via ORAL
  Administered 2014-11-07: 2 via ORAL
  Filled 2014-11-05 (×2): qty 1
  Filled 2014-11-05: qty 2

## 2014-11-05 MED ORDER — HYDROMORPHONE HCL 1 MG/ML IJ SOLN
0.5000 mg | INTRAMUSCULAR | Status: DC | PRN
  Administered 2014-11-07: 0.5 mg via INTRAVENOUS
  Filled 2014-11-05: qty 1

## 2014-11-05 MED ORDER — ZOLPIDEM TARTRATE 5 MG PO TABS
5.0000 mg | ORAL_TABLET | Freq: Every day | ORAL | Status: DC
  Administered 2014-11-05 – 2014-11-07 (×2): 5 mg via ORAL
  Filled 2014-11-05 (×2): qty 1

## 2014-11-05 NOTE — ED Notes (Addendum)
Per EMS pt from [facility] memory care, pt found on floor covered in urine at 0615 today, staff states pt was in bed at 0430 today. Pt c/o left hip pain, shortening of left leg present. Per EMS pt is at mental baseline of dementia, oriented to self. 100 mcg Fentanyl administered IV en route without subsequent relief. Patient's MAR does not include any anticoagulant medications, lists patient as full code.

## 2014-11-05 NOTE — ED Notes (Signed)
[facility] en route, ETA 10 minutes.

## 2014-11-05 NOTE — Consult Note (Signed)
ORTHOPAEDIC CONSULTATION  REQUESTING PHYSICIAN: Verlee Monte, MD  Chief Complaint: Left hip fx  HPI: Laura [name] is a 81 y.o. female who complains of left hip fx s/p mechanical fall.  Patient is slightly disoriented due to sun downing.  Friend who has HCPOA is present.  Could not weight bear s/p fall.  Pain localizes to hip.  Ortho consulted for left hip fx.  Past Medical History  Diagnosis Date  . Hypertension   . Arthritis   . Alzheimer's dementia   . Anxiety   . Depression    Past Surgical History  Procedure Laterality Date  . Shoulder surgery      Left   . Loop recorder implant N/A 06/22/2012    Procedure: LOOP RECORDER IMPLANT;  Surgeon: Sanda Klein, MD;  Location: [facility];  Service: Cardiovascular;  Laterality: N/A;   History   Social History  . Marital Status: Divorced    Spouse Name: N/A  . Number of Children: N/A  . Years of Education: N/A   Social History Main Topics  . Smoking status: Former Research scientist (life sciences)  . Smokeless tobacco: Never Used  . Alcohol Use: No  . Drug Use: No  . Sexual Activity: No   Other Topics Concern  . None   Social History Narrative   History reviewed. No pertinent family history. Allergies  Allergen Reactions  . Codeine     Makes her ill   Prior to Admission medications   Medication Sig Start Date End Date Taking? Authorizing Provider  acetaminophen (MAPAP) 500 MG tablet Take 500 mg by mouth every 6 (six) hours as needed for mild pain, fever or headache.    Yes Historical Provider, MD  albuterol (PROVENTIL HFA;VENTOLIN HFA) 108 (90 BASE) MCG/ACT inhaler Inhale 2 puffs into the lungs every 6 (six) hours as needed for wheezing or shortness of breath. 10/09/14  Yes Nishant Dhungel, MD  alum & mag hydroxide-simeth (MI-ACID MAXIMUM STRENGTH) 400-400-40 MG/5ML suspension Take 30 mLs by mouth every 6 (six) hours as needed for indigestion.    Yes Historical Provider, MD  Carbomer Gel Base (HYDROGEL) GEL Apply 1 application  topically daily as needed (for rash). Apply to face   Yes Historical Provider, MD  cholecalciferol (VITAMIN D) 1000 UNITS tablet Take 1,000 Units by mouth daily with breakfast.    Yes Historical Provider, MD  guaifenesin (Q-TUSSIN) 100 MG/5ML syrup Take 200 mg by mouth every 6 (six) hours as needed for cough.    Yes Historical Provider, MD  loperamide (IMODIUM) 2 MG capsule Take 2 mg by mouth as needed for diarrhea or loose stools. NOT TO EXCEED 8 DOSES IN 24 HR   Yes Historical Provider, MD  magnesium hydroxide (MILK OF MAGNESIA) 400 MG/5ML suspension Take 30 mLs by mouth daily as needed for mild constipation.    Yes Historical Provider, MD  meloxicam (MOBIC) 7.5 MG tablet Take 7.5 mg by mouth daily with breakfast.   Yes Historical Provider, MD  Multiple Vitamins-Minerals (MULTIVITAMIN) tablet Take 1 tablet by mouth daily. 10/25/14  Yes Barton Dubois, MD  Naphazoline-Glycerin-Zinc Sulf (CLEAR EYES MAXIMUM ITCHY EYE OP) Place 1-2 drops into both eyes 3 (three) times daily as needed (for itching and redness). Wait 3 to 5 minutes between each drop   Yes Historical Provider, MD  Neomycin-Bacitracin-Polymyxin (HCA TRIPLE ANTIBIOTIC OINTMENT EX) Apply 1 application topically as needed (for wound care).    Yes Historical Provider, MD  NUTRITIONAL SUPPLEMENT LIQD Take 1 Bottle by mouth 3 (three) times  daily.   Yes Historical Provider, MD  PARoxetine (PAXIL) 40 MG tablet Take 40 mg by mouth every morning.   Yes Historical Provider, MD  polyethylene glycol (MIRALAX / GLYCOLAX) packet Take 17 g by mouth daily as needed for mild constipation.    Yes Historical Provider, MD  temazepam (RESTORIL) 7.5 MG capsule Take 1 capsule (7.5 mg total) by mouth at bedtime. 10/25/14  Yes Barton Dubois, MD  HYDROcodone-acetaminophen (NORCO/VICODIN) 5-325 MG per tablet Take 1 tablet by mouth every 8 (eight) hours as needed for moderate pain. Patient not taking: Reported on 11/05/2014 10/25/14   Barton Dubois, MD   Dg Chest 2  View  11/05/2014   CLINICAL DATA:  [address] at nursing home, shortness of breath, preoperative assessment  EXAM: CHEST  2 VIEW  COMPARISON:  10/07/2014  FINDINGS: Loop recorder projects over LEFT chest.  Enlargement of cardiac silhouette with pulmonary vascular congestion.  Atherosclerotic calcification aorta.  Emphysematous and bronchitic changes consistent with COPD.  Chronic accentuation of perihilar markings unchanged.  No gross infiltrate, pleural effusion or pneumothorax.  Calcified BILATERAL breast prostheses.  Marked osseous demineralization with multiple prior spinal augmentation procedures.  LEFT shoulder prosthesis.  IMPRESSION: COPD changes.  No acute abnormalities.   Electronically Signed   By: Lavonia Dana M.D.   On: 11/05/2014 08:48   Dg Knee 2 Views Left  11/05/2014   CLINICAL DATA:  Left leg foreshortened.  Left knee pain.  Fall.  EXAM: LEFT KNEE - 1-2 VIEW  COMPARISON:  None.  FINDINGS: Degenerative changes in the left knee with joint space narrowing, spurring, most pronounced in the lateral compartment. No fracture, subluxation or dislocation. Diffuse osteopenia. Soft tissues are intact and unremarkable.  IMPRESSION: Tricompartment degenerative changes.  No acute bony abnormality.   Electronically Signed   By: Rolm Baptise M.D.   On: 11/05/2014 08:49   Ct Head Wo Contrast  11/05/2014   CLINICAL DATA:  Fall, found on floor covered in urine at 0615 hours today dementia, LEFT hip pain, hypertension, Alzheimer's  EXAM: CT HEAD WITHOUT CONTRAST  CT CERVICAL SPINE WITHOUT CONTRAST  TECHNIQUE: Multidetector CT imaging of the head and cervical spine was performed following the standard protocol without intravenous contrast. Multiplanar CT image reconstructions of the cervical spine were also generated.  COMPARISON:  10/07/2014  FINDINGS: CT HEAD FINDINGS  Generalized atrophy.  Normal ventricular morphology.  No midline shift or mass effect.  Small vessel chronic ischemic changes of deep cerebral white  matter.  No intracranial hemorrhage, mass lesion, or acute infarction.  Visualized paranasal sinuses and mastoid air cells clear.  Bones unremarkable.  CT CERVICAL SPINE FINDINGS  Beam hardening artifacts of dental origin and from LEFT shoulder prosthesis.  RIGHT thyroid mass 4.1 x 4.1 x 2.5 cm image 63.  Visualized skullbase intact.  Diffuse osseous demineralization.  Prevertebral soft tissues normal thickness.  Disc space narrowing with endplate spur formation at C5-C6.  Multilevel facet degenerative changes.  Vertebral body heights maintained without fracture or subluxation.  No bone destruction.  Lung apices clear.  IMPRESSION: Atrophy with small vessel chronic ischemic changes of deep cerebral white matter.  No acute intracranial abnormalities.  Degenerative disc and facet disease changes cervical spine.  No acute cervical spine abnormalities.  RIGHT thyroid mass 4.1 x 4.1 x 2.5 cm; dedicated thyroid ultrasound followup recommended.   Electronically Signed   By: Lavonia Dana M.D.   On: 11/05/2014 10:46   Ct Cervical Spine Wo Contrast  11/05/2014   CLINICAL DATA:  Fall, found on floor covered in urine at 0615 hours today dementia, LEFT hip pain, hypertension, Alzheimer's  EXAM: CT HEAD WITHOUT CONTRAST  CT CERVICAL SPINE WITHOUT CONTRAST  TECHNIQUE: Multidetector CT imaging of the head and cervical spine was performed following the standard protocol without intravenous contrast. Multiplanar CT image reconstructions of the cervical spine were also generated.  COMPARISON:  10/07/2014  FINDINGS: CT HEAD FINDINGS  Generalized atrophy.  Normal ventricular morphology.  No midline shift or mass effect.  Small vessel chronic ischemic changes of deep cerebral white matter.  No intracranial hemorrhage, mass lesion, or acute infarction.  Visualized paranasal sinuses and mastoid air cells clear.  Bones unremarkable.  CT CERVICAL SPINE FINDINGS  Beam hardening artifacts of dental origin and from LEFT shoulder prosthesis.   RIGHT thyroid mass 4.1 x 4.1 x 2.5 cm image 63.  Visualized skullbase intact.  Diffuse osseous demineralization.  Prevertebral soft tissues normal thickness.  Disc space narrowing with endplate spur formation at C5-C6.  Multilevel facet degenerative changes.  Vertebral body heights maintained without fracture or subluxation.  No bone destruction.  Lung apices clear.  IMPRESSION: Atrophy with small vessel chronic ischemic changes of deep cerebral white matter.  No acute intracranial abnormalities.  Degenerative disc and facet disease changes cervical spine.  No acute cervical spine abnormalities.  RIGHT thyroid mass 4.1 x 4.1 x 2.5 cm; dedicated thyroid ultrasound followup recommended.   Electronically Signed   By: Lavonia Dana M.D.   On: 11/05/2014 10:46   Dg Hip Unilat With Pelvis 2-3 Views Left  11/05/2014   CLINICAL DATA:  Status post fall at nursing home. Clinical shortening of the left leg with lateral rotation, severe pain  EXAM: LEFT HIP (WITH PELVIS) 2-3 VIEWS  COMPARISON:  None.  FINDINGS: The patient has sustained complete fracture through the subcapital region of the left hip. There is mild superior migration of the femur with respect to the femoral head. The bony pelvis is intact. There is a calcified fibroid demonstrated.  IMPRESSION: The patient has sustained an acute subcapital fracture of the left hip with superior migration of the femur with respect to the femoral head.   Electronically Signed   By: David  Martinique   On: 11/05/2014 08:48    Positive ROS: All other systems have been reviewed and were otherwise negative with the exception of those mentioned in the HPI and as above.  Physical Exam: General: Alert, no acute distress Cardiovascular: No pedal edema Respiratory: No cyanosis, no use of accessory musculature GI: No organomegaly, abdomen is soft and non-tender Skin: No lesions in the area of chief complaint Neurologic: Sensation intact distally Psychiatric: Patient is pleasantly  mildly demented 2/2 sundowning Lymphatic: No axillary or cervical lymphadenopathy  MUSCULOSKELETAL:  - painful ROM of hip - foot wwp - distal pulses intact - skin intact  Assessment: Left femoral neck fx  Plan: - needs partial hip replacement - will do surgery Thursday - hold periop heparin - consent to be signed by [facility]  Thank you for the consult and the opportunity to see Ms. Tama Headings, MD [facility] [phone] 5:26 PM

## 2014-11-05 NOTE — H&P (Signed)
[facility] History and Physical  JEFFIFER RABOLD FWY:[id number] DOB: 12-29-1923 DOA: 11/05/2014  Referring physician: EDP PCP: Sande Brothers, MD   Chief Complaint: Fall and left hip pain  HPI: Laura [name] is a 81 y.o. female with past medical history of advanced dementia, HTN and arthritis. Patient lives in a dementia unit, brought after she was found on the floor. Apparently patient had an unwitnessed fall was unable to get up after that, was found on the floor by the dementia unit staff and brought to the hospital for further evaluation. In the ED x-ray of the left hip showed left subcapital close fracture, CT of the head and neck showed no acute findings. She has WBCs of 16.5.  Review of Systems:  Unable to obtain secondary to advanced dementia  Past Medical History  Diagnosis Date  . Hypertension   . Arthritis   . Alzheimer's dementia   . Anxiety   . Depression    Past Surgical History  Procedure Laterality Date  . Shoulder surgery      Left   . Loop recorder implant N/A 06/22/2012    Procedure: LOOP RECORDER IMPLANT;  Surgeon: Sanda Klein, MD;  Location: [facility];  Service: Cardiovascular;  Laterality: N/A;   Social History:   reports that she has quit smoking. She has never used smokeless tobacco. She reports that she does not drink alcohol or use illicit drugs.  Allergies  Allergen Reactions  . Codeine     Makes her ill   Family history: Unable to obtain family history secondary to advanced dementia.  Prior to Admission medications   Medication Sig Start Date End Date Taking? Authorizing Provider  acetaminophen (MAPAP) 500 MG tablet Take 500 mg by mouth every 6 (six) hours as needed for mild pain, fever or headache.    Yes Historical Provider, MD  albuterol (PROVENTIL HFA;VENTOLIN HFA) 108 (90 BASE) MCG/ACT inhaler Inhale 2 puffs into the lungs every 6 (six) hours as needed for wheezing or shortness of breath. 10/09/14  Yes Nishant Dhungel, MD    alum & mag hydroxide-simeth (MI-ACID MAXIMUM STRENGTH) 400-400-40 MG/5ML suspension Take 30 mLs by mouth every 6 (six) hours as needed for indigestion.    Yes Historical Provider, MD  Carbomer Gel Base (HYDROGEL) GEL Apply 1 application topically daily as needed (for rash). Apply to face   Yes Historical Provider, MD  cholecalciferol (VITAMIN D) 1000 UNITS tablet Take 1,000 Units by mouth daily with breakfast.    Yes Historical Provider, MD  guaifenesin (Q-TUSSIN) 100 MG/5ML syrup Take 200 mg by mouth every 6 (six) hours as needed for cough.    Yes Historical Provider, MD  loperamide (IMODIUM) 2 MG capsule Take 2 mg by mouth as needed for diarrhea or loose stools. NOT TO EXCEED 8 DOSES IN 24 HR   Yes Historical Provider, MD  magnesium hydroxide (MILK OF MAGNESIA) 400 MG/5ML suspension Take 30 mLs by mouth daily as needed for mild constipation.    Yes Historical Provider, MD  meloxicam (MOBIC) 7.5 MG tablet Take 7.5 mg by mouth daily with breakfast.   Yes Historical Provider, MD  Multiple Vitamins-Minerals (MULTIVITAMIN) tablet Take 1 tablet by mouth daily. 10/25/14  Yes Barton Dubois, MD  Naphazoline-Glycerin-Zinc Sulf (CLEAR EYES MAXIMUM ITCHY EYE OP) Place 1-2 drops into both eyes 3 (three) times daily as needed (for itching and redness). Wait 3 to 5 minutes between each drop   Yes Historical Provider, MD  Neomycin-Bacitracin-Polymyxin (HCA TRIPLE ANTIBIOTIC OINTMENT EX) Apply 1  application topically as needed (for wound care).    Yes Historical Provider, MD  NUTRITIONAL SUPPLEMENT LIQD Take 1 Bottle by mouth 3 (three) times daily.   Yes Historical Provider, MD  PARoxetine (PAXIL) 40 MG tablet Take 40 mg by mouth every morning.   Yes Historical Provider, MD  polyethylene glycol (MIRALAX / GLYCOLAX) packet Take 17 g by mouth daily as needed for mild constipation.    Yes Historical Provider, MD  temazepam (RESTORIL) 7.5 MG capsule Take 1 capsule (7.5 mg total) by mouth at bedtime. 10/25/14  Yes Barton Dubois, MD  HYDROcodone-acetaminophen (NORCO/VICODIN) 5-325 MG per tablet Take 1 tablet by mouth every 8 (eight) hours as needed for moderate pain. Patient not taking: Reported on 11/05/2014 10/25/14   Barton Dubois, MD   Physical Exam: Filed Vitals:   11/05/14 1210  BP:   Pulse:   Temp: 98.6 F (37 C)  Resp:    Constitutional: Awake and alert but confused and disoriented. Head: Normocephalic and atraumatic.  Nose: Nose normal.  Mouth/Throat: Uvula is midline, oropharynx is clear and moist and mucous membranes are normal.  Eyes: Conjunctivae and EOM are normal. Pupils are equal, round, and reactive to light.  Neck: Trachea normal and normal range of motion. Neck supple.  Cardiovascular: Normal rate, regular rhythm, S1 normal, S2 normal, normal heart sounds and intact distal pulses.   Pulmonary/Chest: Effort normal and breath sounds normal. Bilateral calcified breast implants  Abdominal: Soft. Bowel sounds are normal. There is no hepatosplenomegaly. There is no tenderness.  Musculoskeletal: Shortening and lateral rotation of the left lower extremity consistent with hip fracture.  Neurological:  Has normal strength. No cranial nerve deficit or sensory deficit.  Skin: Skin is warm, dry and intact.  Psychiatric: Has a normal mood and affect. Speech is normal and behavior is normal.   Labs on Admission:  Basic Metabolic Panel:  Recent Labs Lab 11/05/14 0805  NA 139  K 3.9  CL 104  CO2 28  GLUCOSE 106*  BUN 25*  CREATININE 1.04  CALCIUM 8.9   Liver Function Tests:  Recent Labs Lab 11/05/14 0805  AST 22  ALT 16  ALKPHOS 73  BILITOT 0.6  PROT 6.7  ALBUMIN 4.4   No results for input(s): LIPASE, AMYLASE in the last 168 hours. No results for input(s): AMMONIA in the last 168 hours. CBC:  Recent Labs Lab 11/05/14 0805  WBC 16.5*  NEUTROABS 14.8*  HGB 14.8  HCT 47.2*  MCV 92.5  PLT 237   Cardiac Enzymes: No results for input(s): CKTOTAL, CKMB, CKMBINDEX, TROPONINI  in the last 168 hours.  BNP (last 3 results)  Recent Labs  10/08/14 0255  BNP 34.1    ProBNP (last 3 results) No results for input(s): PROBNP in the last 8760 hours.  CBG: No results for input(s): GLUCAP in the last 168 hours.  Radiological Exams on Admission: Dg Chest 2 View  11/05/2014   CLINICAL DATA:  [address] at nursing home, shortness of breath, preoperative assessment  EXAM: CHEST  2 VIEW  COMPARISON:  10/07/2014  FINDINGS: Loop recorder projects over LEFT chest.  Enlargement of cardiac silhouette with pulmonary vascular congestion.  Atherosclerotic calcification aorta.  Emphysematous and bronchitic changes consistent with COPD.  Chronic accentuation of perihilar markings unchanged.  No gross infiltrate, pleural effusion or pneumothorax.  Calcified BILATERAL breast prostheses.  Marked osseous demineralization with multiple prior spinal augmentation procedures.  LEFT shoulder prosthesis.  IMPRESSION: COPD changes.  No acute abnormalities.   Electronically Signed  By: Lavonia Dana M.D.   On: 11/05/2014 08:48   Dg Knee 2 Views Left  11/05/2014   CLINICAL DATA:  Left leg foreshortened.  Left knee pain.  Fall.  EXAM: LEFT KNEE - 1-2 VIEW  COMPARISON:  None.  FINDINGS: Degenerative changes in the left knee with joint space narrowing, spurring, most pronounced in the lateral compartment. No fracture, subluxation or dislocation. Diffuse osteopenia. Soft tissues are intact and unremarkable.  IMPRESSION: Tricompartment degenerative changes.  No acute bony abnormality.   Electronically Signed   By: Rolm Baptise M.D.   On: 11/05/2014 08:49   Ct Head Wo Contrast  11/05/2014   CLINICAL DATA:  Fall, found on floor covered in urine at 0615 hours today dementia, LEFT hip pain, hypertension, Alzheimer's  EXAM: CT HEAD WITHOUT CONTRAST  CT CERVICAL SPINE WITHOUT CONTRAST  TECHNIQUE: Multidetector CT imaging of the head and cervical spine was performed following the standard protocol without intravenous  contrast. Multiplanar CT image reconstructions of the cervical spine were also generated.  COMPARISON:  10/07/2014  FINDINGS: CT HEAD FINDINGS  Generalized atrophy.  Normal ventricular morphology.  No midline shift or mass effect.  Small vessel chronic ischemic changes of deep cerebral white matter.  No intracranial hemorrhage, mass lesion, or acute infarction.  Visualized paranasal sinuses and mastoid air cells clear.  Bones unremarkable.  CT CERVICAL SPINE FINDINGS  Beam hardening artifacts of dental origin and from LEFT shoulder prosthesis.  RIGHT thyroid mass 4.1 x 4.1 x 2.5 cm image 63.  Visualized skullbase intact.  Diffuse osseous demineralization.  Prevertebral soft tissues normal thickness.  Disc space narrowing with endplate spur formation at C5-C6.  Multilevel facet degenerative changes.  Vertebral body heights maintained without fracture or subluxation.  No bone destruction.  Lung apices clear.  IMPRESSION: Atrophy with small vessel chronic ischemic changes of deep cerebral white matter.  No acute intracranial abnormalities.  Degenerative disc and facet disease changes cervical spine.  No acute cervical spine abnormalities.  RIGHT thyroid mass 4.1 x 4.1 x 2.5 cm; dedicated thyroid ultrasound followup recommended.   Electronically Signed   By: Lavonia Dana M.D.   On: 11/05/2014 10:46   Ct Cervical Spine Wo Contrast  11/05/2014   CLINICAL DATA:  Fall, found on floor covered in urine at 0615 hours today dementia, LEFT hip pain, hypertension, Alzheimer's  EXAM: CT HEAD WITHOUT CONTRAST  CT CERVICAL SPINE WITHOUT CONTRAST  TECHNIQUE: Multidetector CT imaging of the head and cervical spine was performed following the standard protocol without intravenous contrast. Multiplanar CT image reconstructions of the cervical spine were also generated.  COMPARISON:  10/07/2014  FINDINGS: CT HEAD FINDINGS  Generalized atrophy.  Normal ventricular morphology.  No midline shift or mass effect.  Small vessel chronic  ischemic changes of deep cerebral white matter.  No intracranial hemorrhage, mass lesion, or acute infarction.  Visualized paranasal sinuses and mastoid air cells clear.  Bones unremarkable.  CT CERVICAL SPINE FINDINGS  Beam hardening artifacts of dental origin and from LEFT shoulder prosthesis.  RIGHT thyroid mass 4.1 x 4.1 x 2.5 cm image 63.  Visualized skullbase intact.  Diffuse osseous demineralization.  Prevertebral soft tissues normal thickness.  Disc space narrowing with endplate spur formation at C5-C6.  Multilevel facet degenerative changes.  Vertebral body heights maintained without fracture or subluxation.  No bone destruction.  Lung apices clear.  IMPRESSION: Atrophy with small vessel chronic ischemic changes of deep cerebral white matter.  No acute intracranial abnormalities.  Degenerative disc and facet  disease changes cervical spine.  No acute cervical spine abnormalities.  RIGHT thyroid mass 4.1 x 4.1 x 2.5 cm; dedicated thyroid ultrasound followup recommended.   Electronically Signed   By: Lavonia Dana M.D.   On: 11/05/2014 10:46   Dg Hip Unilat With Pelvis 2-3 Views Left  11/05/2014   CLINICAL DATA:  Status post fall at nursing home. Clinical shortening of the left leg with lateral rotation, severe pain  EXAM: LEFT HIP (WITH PELVIS) 2-3 VIEWS  COMPARISON:  None.  FINDINGS: The patient has sustained complete fracture through the subcapital region of the left hip. There is mild superior migration of the femur with respect to the femoral head. The bony pelvis is intact. There is a calcified fibroid demonstrated.  IMPRESSION: The patient has sustained an acute subcapital fracture of the left hip with superior migration of the femur with respect to the femoral head.   Electronically Signed   By: David  Martinique   On: 11/05/2014 08:48    EKG: Independently reviewed. Sinus rhythm at 85 bpm  Assessment/Plan Principal Problem:   Closed left hip fracture Active Problems:   LBBB (left bundle branch  block), rate related   Alzheimer's dementia   COPD with emphysema    Closed left hip fracture Secondary to a fall, brought with shortness and and left lateral oral lateral rotation of the left lower extremity. Orthopedics consulted, surgery will be probably Thursday morning. Patient will be transferred to [facility] and there is spot in the OR on Thursday morning.  We'll let patient eat, nothing by mouth after tomorrow as midnight for an [facility] surgery on Thursday. Dilaudid and Tylenol for pain control. Heparin for DVT prophylaxis.  Cardiac risk stratification Patient appears to be a low risk having moderate risk surgery. Patient does not have history of insulin use, CHF, CVA, arrhythmia, CKD or PVD. Has history of syncopal episode, has implanted loop recorder without recording any arrhythmia for over 2 years. Recommend to proceed to surgery without further interventions.  Alzheimer dementia Advanced dementia, patient resides in a dementia unit, probably will need skilled nursing facility after the surgery.  COPD and emphysema Patient did have brief hypoxia, look at the history she was on and off oxygen. No signs of decompensation, no wheezing, no cough or tachycardia. WBCs elevation likely secondary to the fall.  Leukocytosis White blood cells at 16.5, no evidence of infection chest x-ray and urinalysis clear. This is likely secondary to stress from being on the floor for some time. Check CK as well.  Code Status: Full code Family Communication: Plan discussed with Josiah Lobo [phone] (Family friend), he mentioned she does not have living blood relatives that he now, he helps with the decisions for medical care. Disposition Plan: Presented to [facility] ED, transferred to [facility] for surgery on Thursday  Time spent: 70 minutes  [facility] A, MD [facility] Pager [phone]

## 2014-11-05 NOTE — ED Provider Notes (Signed)
CSN: [id number]     Arrival date & time 11/05/14  0736 History   First MD Initiated Contact with Patient 11/05/14 [phone]     Chief Complaint  Patient presents with  . Fall     (Consider location/radiation/quality/duration/timing/severity/associated sxs/prior Treatment) HPI Comments: Pt cannot tell me what happened, reports "everything hurts"  Patient is a 81 y.o. female presenting with fall. The history is provided by the patient. The history is limited by the condition of the patient. No language interpreter was used.  Fall This is a recurrent problem. The current episode started 3 to 5 hours ago. The problem has not changed since onset.   Past Medical History  Diagnosis Date  . Hypertension   . Arthritis   . Alzheimer's dementia   . Anxiety   . Depression    Past Surgical History  Procedure Laterality Date  . Shoulder surgery      Left   . Loop recorder implant N/A 06/22/2012    Procedure: LOOP RECORDER IMPLANT;  Surgeon: Sanda Klein, MD;  Location: [facility];  Service: Cardiovascular;  Laterality: N/A;   History reviewed. No pertinent family history. History  Substance Use Topics  . Smoking status: Former Research scientist (life sciences)  . Smokeless tobacco: Not on file  . Alcohol Use: No   OB History    No data available     Review of Systems  Unable to perform ROS: Dementia      Allergies  Codeine  Home Medications   Prior to Admission medications   Medication Sig Start Date End Date Taking? Authorizing Provider  acetaminophen (MAPAP) 500 MG tablet Take 500 mg by mouth every 6 (six) hours as needed for mild pain, fever or headache.    Yes Historical Provider, MD  albuterol (PROVENTIL HFA;VENTOLIN HFA) 108 (90 BASE) MCG/ACT inhaler Inhale 2 puffs into the lungs every 6 (six) hours as needed for wheezing or shortness of breath. 10/09/14  Yes Nishant Dhungel, MD  alum & mag hydroxide-simeth (MI-ACID MAXIMUM STRENGTH) 400-400-40 MG/5ML suspension Take 30 mLs by mouth every 6 (six)  hours as needed for indigestion.    Yes Historical Provider, MD  Carbomer Gel Base (HYDROGEL) GEL Apply 1 application topically daily as needed (for rash). Apply to face   Yes Historical Provider, MD  cholecalciferol (VITAMIN D) 1000 UNITS tablet Take 1,000 Units by mouth daily with breakfast.    Yes Historical Provider, MD  guaifenesin (Q-TUSSIN) 100 MG/5ML syrup Take 200 mg by mouth every 6 (six) hours as needed for cough.    Yes Historical Provider, MD  loperamide (IMODIUM) 2 MG capsule Take 2 mg by mouth as needed for diarrhea or loose stools. NOT TO EXCEED 8 DOSES IN 24 HR   Yes Historical Provider, MD  magnesium hydroxide (MILK OF MAGNESIA) 400 MG/5ML suspension Take 30 mLs by mouth daily as needed for mild constipation.    Yes Historical Provider, MD  meloxicam (MOBIC) 7.5 MG tablet Take 7.5 mg by mouth daily with breakfast.   Yes Historical Provider, MD  Multiple Vitamins-Minerals (MULTIVITAMIN) tablet Take 1 tablet by mouth daily. 10/25/14  Yes Barton Dubois, MD  Naphazoline-Glycerin-Zinc Sulf (CLEAR EYES MAXIMUM ITCHY EYE OP) Place 1-2 drops into both eyes 3 (three) times daily as needed (for itching and redness). Wait 3 to 5 minutes between each drop   Yes Historical Provider, MD  Neomycin-Bacitracin-Polymyxin (HCA TRIPLE ANTIBIOTIC OINTMENT EX) Apply 1 application topically as needed (for wound care).    Yes Historical Provider, MD  NUTRITIONAL  SUPPLEMENT LIQD Take 1 Bottle by mouth 3 (three) times daily.   Yes Historical Provider, MD  PARoxetine (PAXIL) 40 MG tablet Take 40 mg by mouth every morning.   Yes Historical Provider, MD  polyethylene glycol (MIRALAX / GLYCOLAX) packet Take 17 g by mouth daily as needed for mild constipation.    Yes Historical Provider, MD  temazepam (RESTORIL) 7.5 MG capsule Take 1 capsule (7.5 mg total) by mouth at bedtime. 10/25/14  Yes Barton Dubois, MD  HYDROcodone-acetaminophen (NORCO/VICODIN) 5-325 MG per tablet Take 1 tablet by mouth every 8 (eight) hours as  needed for moderate pain. Patient not taking: Reported on 11/05/2014 10/25/14   Barton Dubois, MD   BP 198/83 mmHg  Pulse 91  Temp(Src) 98.4 F (36.9 C) (Oral)  Resp 20  SpO2 95% Physical Exam  Constitutional: She is oriented to person, place, and time. She appears well-developed and well-nourished. No distress.  HENT:  Head: Normocephalic and atraumatic.  Mouth/Throat: No oropharyngeal exudate.  Eyes: Pupils are equal, round, and reactive to light.  Neck: Normal range of motion. Neck supple.  Cardiovascular: Normal rate, regular rhythm and normal heart sounds.  Exam reveals no gallop and no friction rub.   No murmur heard. Pulmonary/Chest: Effort normal and breath sounds normal. No respiratory distress. She has no wheezes. She has no rales.  Abdominal: Soft. Bowel sounds are normal. She exhibits no distension and no mass. There is no tenderness. There is no rebound and no guarding.  Musculoskeletal: She exhibits no edema or tenderness.       Left hip: She exhibits decreased range of motion and bony tenderness.       Legs: Neurological: She is alert and oriented to person, place, and time.  Skin: Skin is warm and dry.  Psychiatric: She has a normal mood and affect.    ED Course  Procedures (including critical care time) Labs Review Labs Reviewed  CBC WITH DIFFERENTIAL/PLATELET - Abnormal; Notable for the following:    WBC 16.5 (*)    HCT 47.2 (*)    Neutrophils Relative % 90 (*)    Neutro Abs 14.8 (*)    Lymphocytes Relative 6 (*)    All other components within normal limits  COMPREHENSIVE METABOLIC PANEL - Abnormal; Notable for the following:    Glucose, Bld 106 (*)    BUN 25 (*)    GFR calc non Af Amer 47 (*)    GFR calc Af Amer 54 (*)    All other components within normal limits  URINALYSIS, ROUTINE W REFLEX MICROSCOPIC - Abnormal; Notable for the following:    Protein, ur 30 (*)    All other components within normal limits  URINE CULTURE  URINE MICROSCOPIC-ADD ON     Imaging Review Dg Chest 2 View  11/05/2014   CLINICAL DATA:  [place] at nursing home, shortness of breath, preoperative assessment  EXAM: CHEST  2 VIEW  COMPARISON:  10/07/2014  FINDINGS: Loop recorder projects over LEFT chest.  Enlargement of cardiac silhouette with pulmonary vascular congestion.  Atherosclerotic calcification aorta.  Emphysematous and bronchitic changes consistent with COPD.  Chronic accentuation of perihilar markings unchanged.  No gross infiltrate, pleural effusion or pneumothorax.  Calcified BILATERAL breast prostheses.  Marked osseous demineralization with multiple prior spinal augmentation procedures.  LEFT shoulder prosthesis.  IMPRESSION: COPD changes.  No acute abnormalities.   Electronically Signed   By: Lavonia Dana M.D.   On: 11/05/2014 08:48   Dg Knee 2 Views Left  11/05/2014  CLINICAL DATA:  Left leg foreshortened.  Left knee pain.  Fall.  EXAM: LEFT KNEE - 1-2 VIEW  COMPARISON:  None.  FINDINGS: Degenerative changes in the left knee with joint space narrowing, spurring, most pronounced in the lateral compartment. No fracture, subluxation or dislocation. Diffuse osteopenia. Soft tissues are intact and unremarkable.  IMPRESSION: Tricompartment degenerative changes.  No acute bony abnormality.   Electronically Signed   By: Rolm Baptise M.D.   On: 11/05/2014 08:49   Ct Head Wo Contrast  11/05/2014   CLINICAL DATA:  Fall, found on floor covered in urine at 0615 hours today dementia, LEFT hip pain, hypertension, Alzheimer's  EXAM: CT HEAD WITHOUT CONTRAST  CT CERVICAL SPINE WITHOUT CONTRAST  TECHNIQUE: Multidetector CT imaging of the head and cervical spine was performed following the standard protocol without intravenous contrast. Multiplanar CT image reconstructions of the cervical spine were also generated.  COMPARISON:  10/07/2014  FINDINGS: CT HEAD FINDINGS  Generalized atrophy.  Normal ventricular morphology.  No midline shift or mass effect.  Small vessel chronic ischemic  changes of deep cerebral white matter.  No intracranial hemorrhage, mass lesion, or acute infarction.  Visualized paranasal sinuses and mastoid air cells clear.  Bones unremarkable.  CT CERVICAL SPINE FINDINGS  Beam hardening artifacts of dental origin and from LEFT shoulder prosthesis.  RIGHT thyroid mass 4.1 x 4.1 x 2.5 cm image 63.  Visualized skullbase intact.  Diffuse osseous demineralization.  Prevertebral soft tissues normal thickness.  Disc space narrowing with endplate spur formation at C5-C6.  Multilevel facet degenerative changes.  Vertebral body heights maintained without fracture or subluxation.  No bone destruction.  Lung apices clear.  IMPRESSION: Atrophy with small vessel chronic ischemic changes of deep cerebral white matter.  No acute intracranial abnormalities.  Degenerative disc and facet disease changes cervical spine.  No acute cervical spine abnormalities.  RIGHT thyroid mass 4.1 x 4.1 x 2.5 cm; dedicated thyroid ultrasound followup recommended.   Electronically Signed   By: Lavonia Dana M.D.   On: 11/05/2014 10:46   Ct Cervical Spine Wo Contrast  11/05/2014   CLINICAL DATA:  Fall, found on floor covered in urine at 0615 hours today dementia, LEFT hip pain, hypertension, Alzheimer's  EXAM: CT HEAD WITHOUT CONTRAST  CT CERVICAL SPINE WITHOUT CONTRAST  TECHNIQUE: Multidetector CT imaging of the head and cervical spine was performed following the standard protocol without intravenous contrast. Multiplanar CT image reconstructions of the cervical spine were also generated.  COMPARISON:  10/07/2014  FINDINGS: CT HEAD FINDINGS  Generalized atrophy.  Normal ventricular morphology.  No midline shift or mass effect.  Small vessel chronic ischemic changes of deep cerebral white matter.  No intracranial hemorrhage, mass lesion, or acute infarction.  Visualized paranasal sinuses and mastoid air cells clear.  Bones unremarkable.  CT CERVICAL SPINE FINDINGS  Beam hardening artifacts of dental origin and  from LEFT shoulder prosthesis.  RIGHT thyroid mass 4.1 x 4.1 x 2.5 cm image 63.  Visualized skullbase intact.  Diffuse osseous demineralization.  Prevertebral soft tissues normal thickness.  Disc space narrowing with endplate spur formation at C5-C6.  Multilevel facet degenerative changes.  Vertebral body heights maintained without fracture or subluxation.  No bone destruction.  Lung apices clear.  IMPRESSION: Atrophy with small vessel chronic ischemic changes of deep cerebral white matter.  No acute intracranial abnormalities.  Degenerative disc and facet disease changes cervical spine.  No acute cervical spine abnormalities.  RIGHT thyroid mass 4.1 x 4.1 x 2.5 cm; dedicated  thyroid ultrasound followup recommended.   Electronically Signed   By: Lavonia Dana M.D.   On: 11/05/2014 10:46   Dg Hip Unilat With Pelvis 2-3 Views Left  11/05/2014   CLINICAL DATA:  Status post fall at nursing home. Clinical shortening of the left leg with lateral rotation, severe pain  EXAM: LEFT HIP (WITH PELVIS) 2-3 VIEWS  COMPARISON:  None.  FINDINGS: The patient has sustained complete fracture through the subcapital region of the left hip. There is mild superior migration of the femur with respect to the femoral head. The bony pelvis is intact. There is a calcified fibroid demonstrated.  IMPRESSION: The patient has sustained an acute subcapital fracture of the left hip with superior migration of the femur with respect to the femoral head.   Electronically Signed   By: David  Martinique   On: 11/05/2014 08:48     EKG Interpretation None      MDM   Final diagnoses:  Left hip pain  Fall at nursing home, initial encounter  Hypoxia  Closed left hip fracture, initial encounter    Pt is a 81 y.o. female with Pmhx as above who presents with report of unwitnessed fall at nursing home. On PE, pt hypoxic, but in NAD. On PE, she has tender L hip with shortening and external rotation of L leg, also ttp over L knee. Chest non tender,  abdomen non tender.  Patient also initially hypoxic, was placed on nasal cannula initially 6 L, which was able to be titrated down.  Lungs are clear.  Chest x-ray with no acute pulmonary findings.  X-ray hip shows left subcapital femur fracture.  CT head and C-spine ordered given the fall was unwitnessed, CT head, c-spine nml, Incidental R thyroid mass seen. Marland Kitchen  Spoke with D.r [name], who requests pt be transferred to [facility] where he will operate but will probably not be able to do so until [facility]. [facility] will admit.      Ernestina Patches, MD 11/05/14 1053

## 2014-11-05 NOTE — Progress Notes (Signed)
INITIAL NUTRITION ASSESSMENT  DOCUMENTATION CODES Per approved criteria  -Not Applicable   INTERVENTION: Provide Boost Plus po BID, each supplement provides 360 kcal and 14 grams of protein.  Encourage adequate PO intake.  NUTRITION DIAGNOSIS: Increased nutrient needs related to surgery as evidenced by estimated nutrition needs.   Goal: Pt to meet >/= 90% of their estimated nutrition needs   Monitor:  PO intake, weight trends, labs, I/O's  Reason for Assessment: MD consult  81 y.o. female  Admitting Dx: Closed left hip fracture  ASSESSMENT: Pt with past medical history of advanced dementia, HTN and arthritis. Patient lives in a dementia unit, brought after she was found on the floor. X-ray of the left hip showed left subcapital close fracture.  Pt reports having a good appetite currently and PTA eating at least 2 meals daily with consumption of Boost once daily. Weight has been stable. Current meal completion is 40%. Plans for surgery on Thursday. RD to order Boost to aid in caloric and protein needs. Pt was encouraged to eat her food at meals and to drink her supplements.   Pt with no observed significant fat or muscle mass loss.  Labs: Low GFR. High BUN.  Height: Ht Readings from Last 1 Encounters:  03/12/14 5\' 4"  (1.626 m)    Weight: Wt Readings from Last 1 Encounters:  10/25/14 165 lb 2 oz (74.9 kg)    Ideal Body Weight: 120 lbs  % Ideal Body Weight: 133%  Wt Readings from Last 10 Encounters:  10/25/14 165 lb 2 oz (74.9 kg)  03/12/14 160 lb 11.2 oz (72.893 kg)  01/17/13 141 lb (63.957 kg)  06/22/12 170 lb (77.111 kg)    Usual Body Weight: 165 lbs  % Usual Body Weight: 100%  BMI:  Body Mass Index: 28.58 kg/(m^2)  Estimated Nutritional Needs: Kcal: 1750-1950 Protein: 80-95 grams Fluid: 1.75 - 1.95 L/day  Skin: Intact  Diet Order: Diet NPO time specified Diet regular Room service appropriate?: Yes; Fluid consistency:: Thin  EDUCATION  NEEDS: -No education needs identified at this time  No intake or output data in the 24 hours ending 11/05/14 1523  Last BM: PTA  Labs:   Recent Labs Lab 11/05/14 0805  NA 139  K 3.9  CL 104  CO2 28  BUN 25*  CREATININE 1.04  CALCIUM 8.9  GLUCOSE 106*    CBG (last 3)  No results for input(s): GLUCAP in the last 72 hours.  Scheduled Meds: . heparin  5,000 Units Subcutaneous 3 times per day  . PARoxetine  40 mg Oral Daily  . temazepam  7.5 mg Oral QHS    Continuous Infusions: . sodium chloride 50 mL/hr at 11/05/14 1453    Past Medical History  Diagnosis Date  . Hypertension   . Arthritis   . Alzheimer's dementia   . Anxiety   . Depression     Past Surgical History  Procedure Laterality Date  . Shoulder surgery      Left   . Loop recorder implant N/A 06/22/2012    Procedure: LOOP RECORDER IMPLANT;  Surgeon: Sanda Klein, MD;  Location: [facility];  Service: Cardiovascular;  Laterality: N/A;    Kallie Locks, MS, RD, LDN Pager # [phone] After hours/ weekend pager # [phone]

## 2014-11-05 NOTE — ED Notes (Signed)
Bed: WA23 Expected date:  Expected time:  Means of arrival:  Comments: EMS-fall 

## 2014-11-06 ENCOUNTER — Inpatient Hospital Stay (HOSPITAL_COMMUNITY): Payer: Medicare Other

## 2014-11-06 DIAGNOSIS — D497 Neoplasm of unspecified behavior of endocrine glands and other parts of nervous system: Secondary | ICD-10-CM

## 2014-11-06 DIAGNOSIS — E0789 Other specified disorders of thyroid: Secondary | ICD-10-CM | POA: Diagnosis present

## 2014-11-06 LAB — CBC WITH DIFFERENTIAL/PLATELET
BASOS PCT: 0 % (ref 0–1)
Basophils Absolute: 0 10*3/uL (ref 0.0–0.1)
EOS ABS: 0.2 10*3/uL (ref 0.0–0.7)
Eosinophils Relative: 2 % (ref 0–5)
HCT: 45.6 % (ref 36.0–46.0)
HEMOGLOBIN: 14.4 g/dL (ref 12.0–15.0)
Lymphocytes Relative: 8 % — ABNORMAL LOW (ref 12–46)
Lymphs Abs: 1.1 10*3/uL (ref 0.7–4.0)
MCH: 28.7 pg (ref 26.0–34.0)
MCHC: 31.6 g/dL (ref 30.0–36.0)
MCV: 91 fL (ref 78.0–100.0)
MONOS PCT: 9 % (ref 3–12)
Monocytes Absolute: 1.2 10*3/uL — ABNORMAL HIGH (ref 0.1–1.0)
NEUTROS PCT: 81 % — AB (ref 43–77)
Neutro Abs: 10.7 10*3/uL — ABNORMAL HIGH (ref 1.7–7.7)
Platelets: 227 10*3/uL (ref 150–400)
RBC: 5.01 MIL/uL (ref 3.87–5.11)
RDW: 14 % (ref 11.5–15.5)
WBC: 13.1 10*3/uL — AB (ref 4.0–10.5)

## 2014-11-06 LAB — BASIC METABOLIC PANEL
ANION GAP: 7 (ref 5–15)
BUN: 20 mg/dL (ref 6–23)
CHLORIDE: 104 mmol/L (ref 96–112)
CO2: 29 mmol/L (ref 19–32)
Calcium: 8.9 mg/dL (ref 8.4–10.5)
Creatinine, Ser: 1.05 mg/dL (ref 0.50–1.10)
GFR calc Af Amer: 53 mL/min — ABNORMAL LOW (ref >90)
GFR calc non Af Amer: 46 mL/min — ABNORMAL LOW (ref >90)
Glucose, Bld: 101 mg/dL — ABNORMAL HIGH (ref 70–99)
POTASSIUM: 3.9 mmol/L (ref 3.5–5.1)
Sodium: 140 mmol/L (ref 135–145)

## 2014-11-06 LAB — TSH: TSH: 0.521 u[IU]/mL (ref 0.350–4.500)

## 2014-11-06 LAB — T4, FREE: Free T4: 1.03 ng/dL (ref 0.80–1.80)

## 2014-11-06 LAB — URINE CULTURE
Colony Count: NO GROWTH
Culture: NO GROWTH

## 2014-11-06 LAB — SURGICAL PCR SCREEN
MRSA, PCR: NEGATIVE
Staphylococcus aureus: NEGATIVE

## 2014-11-06 LAB — MAGNESIUM: Magnesium: 1.9 mg/dL (ref 1.5–2.5)

## 2014-11-06 MED ORDER — MELOXICAM 7.5 MG PO TABS
7.5000 mg | ORAL_TABLET | Freq: Every day | ORAL | Status: DC
  Administered 2014-11-07 – 2014-11-11 (×5): 7.5 mg via ORAL
  Filled 2014-11-06 (×7): qty 1

## 2014-11-06 MED ORDER — PANTOPRAZOLE SODIUM 40 MG PO TBEC
40.0000 mg | DELAYED_RELEASE_TABLET | Freq: Every day | ORAL | Status: DC
  Administered 2014-11-09 – 2014-11-10 (×2): 40 mg via ORAL
  Filled 2014-11-06 (×3): qty 1

## 2014-11-06 MED ORDER — ALBUTEROL SULFATE (2.5 MG/3ML) 0.083% IN NEBU: 2.5000 mg | INHALATION_SOLUTION | Freq: Four times a day (QID) | RESPIRATORY_TRACT | Status: DC | PRN

## 2014-11-06 NOTE — Clinical Social Work Note (Signed)
Clinical Social Work Assessment  Patient Details  Name: Laura [name] MRN: [id number] Date of Birth: 10/14/1923  Date of referral:  11/06/14               Reason for consult:  Facility Placement, Discharge Planning                Permission sought to share information with:  Family Supports, Other Chauncey Reading, Josiah Lobo [phone]))  Housing/Transportation Living arrangements for the past 2 months:  [place] of Information:  Medical Team, Power of Attorney Patient Interpreter Needed:  None Criminal Activity/Legal Involvement Pertinent to Current Situation/Hospitalization:   None known. Significant Relationships:  Friend (HCPOA is patient's friend, Josiah Lobo.) Lives with:  Facility Resident Do you feel safe going back to the place where you live?   Unable to assess. Need for family participation in patient care:  Yes (Comment)  Care giving concerns:  CSW continuing to follow and assist patient and patient's HCPOA in discharge planning. Patient admitted from an ALF, but may be recommended for short-term rehabilitation post-surgery. Discharge disposition pending at this time.   Social Worker assessment / plan:  CSW spoke with patient's HCPOA/friend, Josiah Lobo, regarding patient's plan of care. Patient's HCPOA stated patient from an ALF [facility] but is understanding and agreeable to the possibility of patient discharging to SNF once medically stable. Per medical team, patient currently scheduled for surgery on 11/07/2014. CSW to continue to follow and assist with discharge planning once PT evaluation completed post-surgery.  Employment status:   Not assessed at this time Insurance information:  Medicare PT Recommendations:  Not assessed at this time Information / Referral to community resources:   Pending PT evaluation, SNF referral to potentially be made.  Patient/Family's Response to care:  Patient's HCPOA understanding and agreeable to CSW plan  of care. Patient's HCPOA stated understanding and agreeableness to possibility of patient discharging to SNF, if recommended by PT/OT.  Patient/Family's Understanding of and Emotional Response to Diagnosis, Current Treatment, and Prognosis:  Patient's HCPOA expressed concern for patient's well-being and discharge disposition. Patient's HCPOA presented as approachable and making decisions in the best interest of the patient.  Emotional Assessment Appearance:   Unable to Assess Attitude/Demeanor/Rapport:  Unable to Assess Affect (typically observed):  Unable to Assess Orientation:  Oriented to Self Alcohol / Substance use:   None known on admission Psych involvement (Current and /or in the community):  No (Comment)  Discharge Needs  Concerns to be addressed:  Discharge Planning Concerns Readmission within the last 30 days:  Yes Current discharge risk:  None Barriers to Discharge:  No Barriers Identified   Caroline Sauger, LCSW 11/06/2014, 3:02 PM  [phone]

## 2014-11-06 NOTE — Progress Notes (Signed)
[facility] PROGRESS NOTE  Laura [name] NKN:[id number] DOB: 1924-06-08 DOA: 11/05/2014 PCP: Sande Brothers, MD  Assessment/Plan: #1 left femoral neck fracture Secondary to mechanical fall. Patient has been seen in consultation by orthopedics to feel patient needs partial hip replacement. Patient possibly for surgery tomorrow 11/07/2014. Continue pain management. Per orthopedics.  #2 Alzheimer's dementia Patient with advanced dementia. Patient resides in a dementia unit. Patient pleasantly confused. Follow.  #3 COPD and emphysema Stable.  #4 leukocytosis Likely reactive in nature. Chest x-ray negative. Urinalysis is negative. Leukocytosis trending down. No need none biotics at this time. Follow.  #5 thyroid mass Procedure CT. Will get a thyroid ultrasound. Check thyroid function studies. Will likely need outpatient follow-up.  #6 prophylaxis PPI for GI prohpylaxis. Heparin for DVT prophylaxis.  Code Status: Full Family Communication: Updated patient. No family at bedside. Disposition Plan: Remain inpatient. Pending surgery.   Consultants:  Orthopedics: Dr. [name] 11/05/2014  Procedures:  CT C-spine and head 11/05/2014  X-ray of the knee 11/05/2014  Chest x-ray 11/05/2014  Antibiotics:  None  HPI/Subjective: Patient no complaints. Patient pleasantly confused.  Objective: Filed Vitals:   11/06/14 1351  BP: 124/68  Pulse: 98  Temp: 98.3 F (36.8 C)  Resp: 18    Intake/Output Summary (Last 24 hours) at 11/06/14 1438 Last data filed at 11/06/14 1300  Gross per 24 hour  Intake 565.83 ml  Output    625 ml  Net -59.17 ml   There were no vitals filed for this visit.  Exam:   General:  NAD  Cardiovascular: RRR  Respiratory: CTAB  Abdomen: Soft/NT/ND/+BS  Musculoskeletal: No c/c/e  Data Reviewed: Basic Metabolic Panel:  Recent Labs Lab 11/05/14 0805 11/06/14 0909  NA 139 140  K 3.9 3.9  CL 104 104  CO2 28 29  GLUCOSE 106* 101*  BUN  25* 20  CREATININE 1.04 1.05  CALCIUM 8.9 8.9  MG  --  1.9   Liver Function Tests:  Recent Labs Lab 11/05/14 0805  AST 22  ALT 16  ALKPHOS 73  BILITOT 0.6  PROT 6.7  ALBUMIN 4.4   No results for input(s): LIPASE, AMYLASE in the last 168 hours. No results for input(s): AMMONIA in the last 168 hours. CBC:  Recent Labs Lab 11/05/14 0805 11/06/14 0909  WBC 16.5* 13.1*  NEUTROABS 14.8* 10.7*  HGB 14.8 14.4  HCT 47.2* 45.6  MCV 92.5 91.0  PLT 237 227   Cardiac Enzymes: No results for input(s): CKTOTAL, CKMB, CKMBINDEX, TROPONINI in the last 168 hours. BNP (last 3 results)  Recent Labs  10/08/14 0255  BNP 34.1    ProBNP (last 3 results) No results for input(s): PROBNP in the last 8760 hours.  CBG: No results for input(s): GLUCAP in the last 168 hours.  Recent Results (from the past 240 hour(s))  Urine culture     Status: None   Collection Time: 11/05/14  7:36 AM  Result Value Ref Range Status   Specimen Description URINE, CATHETERIZED  Final   Special Requests NONE  Final   Colony Count NO GROWTH Performed at [facility]   Final   Culture NO GROWTH Performed at [facility]   Final   Report Status 11/06/2014 FINAL  Final  Surgical pcr screen     Status: None   Collection Time: 11/05/14 10:40 PM  Result Value Ref Range Status   MRSA, PCR NEGATIVE NEGATIVE Final   Staphylococcus aureus NEGATIVE NEGATIVE Final    Comment:  The Xpert SA Assay (FDA approved for NASAL specimens in patients over 55 years of age), is one component of a comprehensive surveillance program.  Test performance has been validated by [facility] for patients greater than or equal to 29 year old. It is not intended to diagnose infection nor to guide or monitor treatment.      Studies: Dg Chest 2 View  11/05/2014   CLINICAL DATA:  [place] at nursing home, shortness of breath, preoperative assessment  EXAM: CHEST  2 VIEW  COMPARISON:  10/07/2014  FINDINGS:  Loop recorder projects over LEFT chest.  Enlargement of cardiac silhouette with pulmonary vascular congestion.  Atherosclerotic calcification aorta.  Emphysematous and bronchitic changes consistent with COPD.  Chronic accentuation of perihilar markings unchanged.  No gross infiltrate, pleural effusion or pneumothorax.  Calcified BILATERAL breast prostheses.  Marked osseous demineralization with multiple prior spinal augmentation procedures.  LEFT shoulder prosthesis.  IMPRESSION: COPD changes.  No acute abnormalities.   Electronically Signed   By: Lavonia Dana M.D.   On: 11/05/2014 08:48   Dg Knee 2 Views Left  11/05/2014   CLINICAL DATA:  Left leg foreshortened.  Left knee pain.  Fall.  EXAM: LEFT KNEE - 1-2 VIEW  COMPARISON:  None.  FINDINGS: Degenerative changes in the left knee with joint space narrowing, spurring, most pronounced in the lateral compartment. No fracture, subluxation or dislocation. Diffuse osteopenia. Soft tissues are intact and unremarkable.  IMPRESSION: Tricompartment degenerative changes.  No acute bony abnormality.   Electronically Signed   By: Rolm Baptise M.D.   On: 11/05/2014 08:49   Ct Head Wo Contrast  11/05/2014   CLINICAL DATA:  Fall, found on floor covered in urine at 0615 hours today dementia, LEFT hip pain, hypertension, Alzheimer's  EXAM: CT HEAD WITHOUT CONTRAST  CT CERVICAL SPINE WITHOUT CONTRAST  TECHNIQUE: Multidetector CT imaging of the head and cervical spine was performed following the standard protocol without intravenous contrast. Multiplanar CT image reconstructions of the cervical spine were also generated.  COMPARISON:  10/07/2014  FINDINGS: CT HEAD FINDINGS  Generalized atrophy.  Normal ventricular morphology.  No midline shift or mass effect.  Small vessel chronic ischemic changes of deep cerebral white matter.  No intracranial hemorrhage, mass lesion, or acute infarction.  Visualized paranasal sinuses and mastoid air cells clear.  Bones unremarkable.  CT  CERVICAL SPINE FINDINGS  Beam hardening artifacts of dental origin and from LEFT shoulder prosthesis.  RIGHT thyroid mass 4.1 x 4.1 x 2.5 cm image 63.  Visualized skullbase intact.  Diffuse osseous demineralization.  Prevertebral soft tissues normal thickness.  Disc space narrowing with endplate spur formation at C5-C6.  Multilevel facet degenerative changes.  Vertebral body heights maintained without fracture or subluxation.  No bone destruction.  Lung apices clear.  IMPRESSION: Atrophy with small vessel chronic ischemic changes of deep cerebral white matter.  No acute intracranial abnormalities.  Degenerative disc and facet disease changes cervical spine.  No acute cervical spine abnormalities.  RIGHT thyroid mass 4.1 x 4.1 x 2.5 cm; dedicated thyroid ultrasound followup recommended.   Electronically Signed   By: Lavonia Dana M.D.   On: 11/05/2014 10:46   Ct Cervical Spine Wo Contrast  11/05/2014   CLINICAL DATA:  Fall, found on floor covered in urine at 0615 hours today dementia, LEFT hip pain, hypertension, Alzheimer's  EXAM: CT HEAD WITHOUT CONTRAST  CT CERVICAL SPINE WITHOUT CONTRAST  TECHNIQUE: Multidetector CT imaging of the head and cervical spine was performed following the standard  protocol without intravenous contrast. Multiplanar CT image reconstructions of the cervical spine were also generated.  COMPARISON:  10/07/2014  FINDINGS: CT HEAD FINDINGS  Generalized atrophy.  Normal ventricular morphology.  No midline shift or mass effect.  Small vessel chronic ischemic changes of deep cerebral white matter.  No intracranial hemorrhage, mass lesion, or acute infarction.  Visualized paranasal sinuses and mastoid air cells clear.  Bones unremarkable.  CT CERVICAL SPINE FINDINGS  Beam hardening artifacts of dental origin and from LEFT shoulder prosthesis.  RIGHT thyroid mass 4.1 x 4.1 x 2.5 cm image 63.  Visualized skullbase intact.  Diffuse osseous demineralization.  Prevertebral soft tissues normal  thickness.  Disc space narrowing with endplate spur formation at C5-C6.  Multilevel facet degenerative changes.  Vertebral body heights maintained without fracture or subluxation.  No bone destruction.  Lung apices clear.  IMPRESSION: Atrophy with small vessel chronic ischemic changes of deep cerebral white matter.  No acute intracranial abnormalities.  Degenerative disc and facet disease changes cervical spine.  No acute cervical spine abnormalities.  RIGHT thyroid mass 4.1 x 4.1 x 2.5 cm; dedicated thyroid ultrasound followup recommended.   Electronically Signed   By: Lavonia Dana M.D.   On: 11/05/2014 10:46   Dg Hip Unilat With Pelvis 2-3 Views Left  11/05/2014   CLINICAL DATA:  Status post fall at nursing home. Clinical shortening of the left leg with lateral rotation, severe pain  EXAM: LEFT HIP (WITH PELVIS) 2-3 VIEWS  COMPARISON:  None.  FINDINGS: The patient has sustained complete fracture through the subcapital region of the left hip. There is mild superior migration of the femur with respect to the femoral head. The bony pelvis is intact. There is a calcified fibroid demonstrated.  IMPRESSION: The patient has sustained an acute subcapital fracture of the left hip with superior migration of the femur with respect to the femoral head.   Electronically Signed   By: David  Martinique   On: 11/05/2014 08:48    Scheduled Meds: . lactose free nutrition  237 mL Oral BID BM  . meloxicam  7.5 mg Oral Q breakfast  . pantoprazole  40 mg Oral Q0600  . PARoxetine  40 mg Oral Daily  . zolpidem  5 mg Oral QHS   Continuous Infusions: . sodium chloride 50 mL/hr at 11/05/14 1453    Principal Problem:   Closed left hip fracture Active Problems:   LBBB (left bundle branch block), rate related   Alzheimer's dementia   COPD with emphysema   Thyroid mass of unclear etiology: per CT c spine 11/05/14    Time spent: 40 minutes    [facility] MD [facility] Pager [phone]. If 7PM-7AM, please  contact night-coverage at [email or web address], password [facility] 11/06/2014, 2:38 PM  LOS: 1 day

## 2014-11-07 ENCOUNTER — Inpatient Hospital Stay (HOSPITAL_COMMUNITY): Payer: Medicare Other | Admitting: Anesthesiology

## 2014-11-07 ENCOUNTER — Encounter (HOSPITAL_COMMUNITY)
Admission: EM | Disposition: A | Discharge status: Skilled Nursing Facility | Payer: Self-pay | Source: Home / Self Care | Attending: Internal Medicine

## 2014-11-07 ENCOUNTER — Inpatient Hospital Stay (HOSPITAL_COMMUNITY): Payer: Medicare Other

## 2014-11-07 ENCOUNTER — Encounter (HOSPITAL_COMMUNITY): Payer: Self-pay | Admitting: Certified Registered Nurse Anesthetist

## 2014-11-07 DIAGNOSIS — I447 Left bundle-branch block, unspecified: Secondary | ICD-10-CM

## 2014-11-07 DIAGNOSIS — M25552 Pain in left hip: Secondary | ICD-10-CM

## 2014-11-07 DIAGNOSIS — E079 Disorder of thyroid, unspecified: Secondary | ICD-10-CM

## 2014-11-07 HISTORY — PX: TOTAL HIP ARTHROPLASTY: SHX124

## 2014-11-07 LAB — CBC
HEMATOCRIT: 39.8 % (ref 36.0–46.0)
Hemoglobin: 12.2 g/dL (ref 12.0–15.0)
MCH: 28.7 pg (ref 26.0–34.0)
MCHC: 30.7 g/dL (ref 30.0–36.0)
MCV: 93.6 fL (ref 78.0–100.0)
Platelets: 202 10*3/uL (ref 150–400)
RBC: 4.25 MIL/uL (ref 3.87–5.11)
RDW: 14.2 % (ref 11.5–15.5)
WBC: 14.1 10*3/uL — ABNORMAL HIGH (ref 4.0–10.5)

## 2014-11-07 LAB — CREATININE, SERUM
CREATININE: 1.1 mg/dL (ref 0.50–1.10)
GFR calc Af Amer: 50 mL/min — ABNORMAL LOW (ref >90)
GFR, EST NON AFRICAN AMERICAN: 44 mL/min — AB (ref >90)

## 2014-11-07 LAB — T3: T3 TOTAL: 69 ng/dL — AB (ref 71–180)

## 2014-11-07 LAB — T3, FREE: T3, Free: 2.4 pg/mL (ref 2.0–4.4)

## 2014-11-07 SURGERY — ARTHROPLASTY, HIP, TOTAL, ANTERIOR APPROACH
Anesthesia: General | Site: Hip | Laterality: Left

## 2014-11-07 MED ORDER — PHENYLEPHRINE HCL 10 MG/ML IJ SOLN
10.0000 mg | INTRAMUSCULAR | Status: DC | PRN
  Administered 2014-11-07: 20 ug/min via INTRAVENOUS

## 2014-11-07 MED ORDER — ENOXAPARIN SODIUM 40 MG/0.4ML ~~LOC~~ SOLN
40.0000 mg | SUBCUTANEOUS | Status: DC
  Administered 2014-11-08 – 2014-11-11 (×4): 40 mg via SUBCUTANEOUS
  Filled 2014-11-07 (×4): qty 0.4

## 2014-11-07 MED ORDER — FENTANYL CITRATE (PF) 100 MCG/2ML IJ SOLN
25.0000 ug | INTRAMUSCULAR | Status: DC | PRN
  Administered 2014-11-07: 25 ug via INTRAVENOUS

## 2014-11-07 MED ORDER — FENTANYL CITRATE (PF) 250 MCG/5ML IJ SOLN
INTRAMUSCULAR | Status: AC
  Filled 2014-11-07: qty 5

## 2014-11-07 MED ORDER — OXYCODONE HCL 5 MG PO TABS
5.0000 mg | ORAL_TABLET | ORAL | Status: DC | PRN
  Administered 2014-11-08: 5 mg via ORAL
  Filled 2014-11-07: qty 1

## 2014-11-07 MED ORDER — METHOCARBAMOL 500 MG PO TABS: 500.0000 mg | ORAL_TABLET | Freq: Four times a day (QID) | ORAL | Status: DC | PRN

## 2014-11-07 MED ORDER — ONDANSETRON HCL 4 MG PO TABS: 4.0000 mg | ORAL_TABLET | Freq: Four times a day (QID) | ORAL | Status: DC | PRN

## 2014-11-07 MED ORDER — SUCCINYLCHOLINE CHLORIDE 20 MG/ML IJ SOLN
INTRAMUSCULAR | Status: AC
  Filled 2014-11-07: qty 1

## 2014-11-07 MED ORDER — CEFAZOLIN SODIUM-DEXTROSE 2-3 GM-% IV SOLR
2.0000 g | Freq: Four times a day (QID) | INTRAVENOUS | Status: DC
  Filled 2014-11-07 (×4): qty 50

## 2014-11-07 MED ORDER — OXYCODONE HCL 5 MG PO TABS: 5.0000 mg | ORAL_TABLET | ORAL | Status: DC | PRN

## 2014-11-07 MED ORDER — LIDOCAINE HCL (CARDIAC) 20 MG/ML IV SOLN
INTRAVENOUS | Status: AC
  Filled 2014-11-07: qty 5

## 2014-11-07 MED ORDER — GLYCOPYRROLATE 0.2 MG/ML IJ SOLN
INTRAMUSCULAR | Status: DC | PRN
  Administered 2014-11-07: 0.4 mg via INTRAVENOUS

## 2014-11-07 MED ORDER — ACETAMINOPHEN 650 MG RE SUPP: 650.0000 mg | Freq: Four times a day (QID) | RECTAL | Status: DC | PRN

## 2014-11-07 MED ORDER — ENOXAPARIN SODIUM 40 MG/0.4ML ~~LOC~~ SOLN: 40.0000 mg | Freq: Every day | SUBCUTANEOUS | Status: DC

## 2014-11-07 MED ORDER — METOCLOPRAMIDE HCL 5 MG PO TABS: 5.0000 mg | ORAL_TABLET | Freq: Three times a day (TID) | ORAL | Status: DC | PRN

## 2014-11-07 MED ORDER — CEFAZOLIN SODIUM-DEXTROSE 2-3 GM-% IV SOLR
2.0000 g | Freq: Four times a day (QID) | INTRAVENOUS | Status: AC
  Administered 2014-11-07 – 2014-11-08 (×3): 2 g via INTRAVENOUS
  Filled 2014-11-07 (×3): qty 50

## 2014-11-07 MED ORDER — GLYCOPYRROLATE 0.2 MG/ML IJ SOLN
INTRAMUSCULAR | Status: AC
  Filled 2014-11-07: qty 2

## 2014-11-07 MED ORDER — PROPOFOL 10 MG/ML IV BOLUS
INTRAVENOUS | Status: DC | PRN
  Administered 2014-11-07: 80 mg via INTRAVENOUS

## 2014-11-07 MED ORDER — ONDANSETRON HCL 4 MG/2ML IJ SOLN: 4.0000 mg | Freq: Four times a day (QID) | INTRAMUSCULAR | Status: DC | PRN

## 2014-11-07 MED ORDER — FENTANYL CITRATE (PF) 100 MCG/2ML IJ SOLN
INTRAMUSCULAR | Status: AC
  Filled 2014-11-07: qty 2

## 2014-11-07 MED ORDER — LIDOCAINE HCL (CARDIAC) 20 MG/ML IV SOLN
INTRAVENOUS | Status: DC | PRN
  Administered 2014-11-07: 60 mg via INTRAVENOUS

## 2014-11-07 MED ORDER — FENTANYL CITRATE (PF) 100 MCG/2ML IJ SOLN
INTRAMUSCULAR | Status: DC | PRN
  Administered 2014-11-07: 50 ug via INTRAVENOUS
  Administered 2014-11-07: 75 ug via INTRAVENOUS
  Administered 2014-11-07: 25 ug via INTRAVENOUS
  Administered 2014-11-07: 50 ug via INTRAVENOUS

## 2014-11-07 MED ORDER — 0.9 % SODIUM CHLORIDE (POUR BTL) OPTIME
TOPICAL | Status: DC | PRN
  Administered 2014-11-07: 1000 mL

## 2014-11-07 MED ORDER — LACTATED RINGERS IV SOLN
INTRAVENOUS | Status: DC | PRN
  Administered 2014-11-07 (×2): via INTRAVENOUS

## 2014-11-07 MED ORDER — EPHEDRINE SULFATE 50 MG/ML IJ SOLN
INTRAMUSCULAR | Status: AC
  Filled 2014-11-07: qty 1

## 2014-11-07 MED ORDER — PROPOFOL 10 MG/ML IV BOLUS
INTRAVENOUS | Status: AC
  Filled 2014-11-07: qty 20

## 2014-11-07 MED ORDER — HYDROCODONE-ACETAMINOPHEN 5-325 MG PO TABS: 1.0000 | ORAL_TABLET | Freq: Four times a day (QID) | ORAL | Status: DC | PRN

## 2014-11-07 MED ORDER — ONDANSETRON HCL 4 MG/2ML IJ SOLN: 4.0000 mg | Freq: Once | INTRAMUSCULAR | Status: DC | PRN

## 2014-11-07 MED ORDER — CEFAZOLIN SODIUM-DEXTROSE 2-3 GM-% IV SOLR
INTRAVENOUS | Status: AC
  Filled 2014-11-07: qty 50

## 2014-11-07 MED ORDER — ACETAMINOPHEN 325 MG PO TABS
650.0000 mg | ORAL_TABLET | Freq: Four times a day (QID) | ORAL | Status: DC | PRN
  Administered 2014-11-08 – 2014-11-11 (×6): 650 mg via ORAL
  Filled 2014-11-07 (×6): qty 2

## 2014-11-07 MED ORDER — MENTHOL 3 MG MT LOZG: 1.0000 | LOZENGE | OROMUCOSAL | Status: DC | PRN

## 2014-11-07 MED ORDER — CEFAZOLIN SODIUM-DEXTROSE 2-3 GM-% IV SOLR
INTRAVENOUS | Status: DC | PRN
  Administered 2014-11-07: 2 g via INTRAVENOUS

## 2014-11-07 MED ORDER — ONDANSETRON HCL 4 MG/2ML IJ SOLN
INTRAMUSCULAR | Status: AC
  Filled 2014-11-07: qty 2

## 2014-11-07 MED ORDER — SODIUM CHLORIDE 0.9 % IR SOLN
Status: DC | PRN
  Administered 2014-11-07: 1000 mL
  Administered 2014-11-07: 3000 mL

## 2014-11-07 MED ORDER — ALUM & MAG HYDROXIDE-SIMETH 200-200-20 MG/5ML PO SUSP: 30.0000 mL | ORAL | Status: DC | PRN

## 2014-11-07 MED ORDER — MORPHINE SULFATE 2 MG/ML IJ SOLN: 0.5000 mg | INTRAMUSCULAR | Status: DC | PRN

## 2014-11-07 MED ORDER — METOCLOPRAMIDE HCL 5 MG/ML IJ SOLN: 5.0000 mg | Freq: Three times a day (TID) | INTRAMUSCULAR | Status: DC | PRN

## 2014-11-07 MED ORDER — DEXTROSE 5 % IV SOLN
500.0000 mg | Freq: Four times a day (QID) | INTRAVENOUS | Status: DC | PRN
  Filled 2014-11-07: qty 5

## 2014-11-07 MED ORDER — PHENOL 1.4 % MT LIQD: 1.0000 | OROMUCOSAL | Status: DC | PRN

## 2014-11-07 MED ORDER — STERILE WATER FOR INJECTION IJ SOLN
INTRAMUSCULAR | Status: AC
  Filled 2014-11-07: qty 10

## 2014-11-07 MED ORDER — ROCURONIUM BROMIDE 100 MG/10ML IV SOLN
INTRAVENOUS | Status: DC | PRN
  Administered 2014-11-07: 30 mg via INTRAVENOUS

## 2014-11-07 MED ORDER — SODIUM CHLORIDE 0.9 % IV SOLN
INTRAVENOUS | Status: DC
  Administered 2014-11-07: 18:00:00 via INTRAVENOUS

## 2014-11-07 MED ORDER — ONDANSETRON HCL 4 MG/2ML IJ SOLN
INTRAMUSCULAR | Status: DC | PRN
  Administered 2014-11-07: 4 mg via INTRAVENOUS

## 2014-11-07 MED ORDER — NEOSTIGMINE METHYLSULFATE 10 MG/10ML IV SOLN
INTRAVENOUS | Status: DC | PRN
  Administered 2014-11-07: 3 mg via INTRAVENOUS

## 2014-11-07 SURGICAL SUPPLY — 59 items
BLADE SAW SGTL 18X1.27X75 (BLADE) ×2 IMPLANT
BLADE SAW SGTL 18X1.27X75MM (BLADE) ×1
BNDG COHESIVE 6X5 TAN STRL LF (GAUZE/BANDAGES/DRESSINGS) ×3 IMPLANT
CAPT HIP HEMI 2 ×2 IMPLANT
CELLS DAT CNTRL 66122 CELL SVR (MISCELLANEOUS) ×1 IMPLANT
DRAPE C-ARM 42X72 X-RAY (DRAPES) ×3 IMPLANT
DRAPE IMP U-DRAPE 54X76 (DRAPES) ×3 IMPLANT
DRAPE STERI IOBAN 125X83 (DRAPES) ×3 IMPLANT
DRAPE U-SHAPE 47X51 STRL (DRAPES) ×9 IMPLANT
DRSG AQUACEL AG ADV 3.5X10 (GAUZE/BANDAGES/DRESSINGS) ×3 IMPLANT
DRSG MEPILEX BORDER 4X8 (GAUZE/BANDAGES/DRESSINGS) ×2 IMPLANT
DURAPREP 26ML APPLICATOR (WOUND CARE) ×3 IMPLANT
ELECT BLADE 4.0 EZ CLEAN MEGAD (MISCELLANEOUS) ×3
ELECT REM PT RETURN 9FT ADLT (ELECTROSURGICAL) ×3
ELECTRODE BLDE 4.0 EZ CLN MEGD (MISCELLANEOUS) ×1 IMPLANT
ELECTRODE REM PT RTRN 9FT ADLT (ELECTROSURGICAL) ×1 IMPLANT
FACESHIELD WRAPAROUND (MASK) ×3 IMPLANT
FACESHIELD WRAPAROUND OR TEAM (MASK) ×1 IMPLANT
GLOVE BIO SURGEON STRL SZ 6.5 (GLOVE) ×2 IMPLANT
GLOVE BIO SURGEON STRL SZ7 (GLOVE) ×4 IMPLANT
GLOVE BIO SURGEONS STRL SZ 6.5 (GLOVE) ×2
GLOVE BIOGEL PI IND STRL 7.0 (GLOVE) IMPLANT
GLOVE BIOGEL PI IND STRL 7.5 (GLOVE) IMPLANT
GLOVE BIOGEL PI INDICATOR 7.0 (GLOVE) ×4
GLOVE BIOGEL PI INDICATOR 7.5 (GLOVE) ×2
GLOVE ECLIPSE 7.5 STRL STRAW (GLOVE) ×2 IMPLANT
GLOVE NEODERM STRL 7.5 LF PF (GLOVE) ×2 IMPLANT
GLOVE SURG NEODERM 7.5  LF PF (GLOVE) ×2
GLOVE SURG SYN 7.5  E (GLOVE) ×2
GLOVE SURG SYN 7.5 E (GLOVE) ×1 IMPLANT
GLOVE SURG SYN 7.5 PF PI (GLOVE) ×1 IMPLANT
GOWN SRG XL XLNG 56XLVL 4 (GOWN DISPOSABLE) ×2 IMPLANT
GOWN STRL NON-REIN XL XLG LVL4 (GOWN DISPOSABLE) ×3
GOWN STRL REUS W/ TWL LRG LVL3 (GOWN DISPOSABLE) IMPLANT
GOWN STRL REUS W/TWL LRG LVL3 (GOWN DISPOSABLE) ×12
HANDPIECE INTERPULSE COAX TIP (DISPOSABLE) ×3
KIT BASIN OR (CUSTOM PROCEDURE TRAY) ×3 IMPLANT
MARKER SKIN DUAL TIP RULER LAB (MISCELLANEOUS) ×3 IMPLANT
PACK TOTAL JOINT (CUSTOM PROCEDURE TRAY) ×3 IMPLANT
PACK UNIVERSAL I (CUSTOM PROCEDURE TRAY) ×3 IMPLANT
PADDING CAST COTTON 6X4 STRL (CAST SUPPLIES) ×1 IMPLANT
RETRACTOR WND ALEXIS 18 MED (MISCELLANEOUS) ×1 IMPLANT
RTRCTR WOUND ALEXIS 18CM MED (MISCELLANEOUS) ×3
SEALER BIPOLAR AQUA 6.0 (INSTRUMENTS) ×2 IMPLANT
SET HNDPC FAN SPRY TIP SCT (DISPOSABLE) ×1 IMPLANT
SOLUTION BETADINE 4OZ (MISCELLANEOUS) ×3 IMPLANT
SUT ETHIBOND NAB CT1 #1 30IN (SUTURE) ×11 IMPLANT
SUT ETHILON 3 0 FSL (SUTURE) ×1 IMPLANT
SUT VIC AB 0 CT1 27 (SUTURE) ×6
SUT VIC AB 0 CT1 27XBRD ANBCTR (SUTURE) ×1 IMPLANT
SUT VIC AB 1 CT1 27 (SUTURE) ×3
SUT VIC AB 1 CT1 27XBRD ANBCTR (SUTURE) ×1 IMPLANT
SUT VIC AB 2-0 CT1 27 (SUTURE) ×3
SUT VIC AB 2-0 CT1 TAPERPNT 27 (SUTURE) ×2 IMPLANT
SYR 20CC LL (SYRINGE) ×3 IMPLANT
TOWEL OR 17X26 10 PK STRL BLUE (TOWEL DISPOSABLE) ×3 IMPLANT
TRAY FOLEY CATH 16FR SILVER (SET/KITS/TRAYS/PACK) ×1 IMPLANT
TUBE CONNECTING 12'X1/4 (SUCTIONS) ×1
TUBE CONNECTING 12X1/4 (SUCTIONS) ×1 IMPLANT

## 2014-11-07 NOTE — Discharge Instructions (Signed)
° ° °  1. Change dressings as needed °2. May shower but keep incisions covered and dry °3. Take lovenox to prevent blood clots °4. Take stool softeners as needed °5. Take pain meds as needed ° °

## 2014-11-07 NOTE — Transfer of Care (Signed)
Immediate Anesthesia Transfer of Care Note  Patient: Laura [name]  Procedure(s) Performed: Procedure(s): Left hip anterior hemiarthroplasty (Left)  Patient Location: PACU  Anesthesia Type:General  Level of Consciousness: awake and alert   Airway & Oxygen Therapy: Patient Spontanous Breathing and Patient connected to nasal cannula oxygen  Post-op Assessment: Report given to RN, Post -op Vital signs reviewed and stable and Patient moving all extremities X 4  Post vital signs: Reviewed and stable  Last Vitals:  Filed Vitals:   11/07/14 0800  BP: 130/78  Pulse: 104  Temp: 37 C  Resp: 21    Complications: No apparent anesthesia complications

## 2014-11-07 NOTE — Progress Notes (Signed)
[facility] PROGRESS NOTE  Laura [name] ZOX:[id number] DOB: 12-30-1923 DOA: 11/05/2014 PCP: Sande Brothers, MD  Assessment/Plan: #1 left femoral neck fracture Secondary to mechanical fall. Patient has been seen in consultation by orthopedics to feel patient needs partial hip replacement. Patient for surgery today, 11/07/2014. Continue pain management. Per orthopedics.  #2 Alzheimer's dementia Patient with advanced dementia. Patient resides in a dementia unit. Patient pleasantly confused. Follow.  #3 COPD and emphysema Stable.  #4 leukocytosis Likely reactive in nature. Chest x-ray negative. Urinalysis is negative. Leukocytosis trending down. No need none biotics at this time. Follow.  #5 thyroid mass Per CT. free T4 within normal limits. Free T3 within normal limits. Thyroid ultrasound pending.  Will likely need outpatient follow-up.  #6 prophylaxis PPI for GI prohpylaxis. Heparin for DVT prophylaxis.  Code Status: Full Family Communication: Updated patient. No family at bedside. Disposition Plan: Remain inpatient. Pending surgery.   Consultants:  Orthopedics: Dr. [name] 11/05/2014  Procedures:  CT C-spine and head 11/05/2014  X-ray of the knee 11/05/2014  Chest x-ray 11/05/2014  Thyroid ultrasound 11/06/2014  Antibiotics:  None  HPI/Subjective: Patient sleeping however easily arousable. Patient pleasantly confused. Patient denies any pain. Patient denies any chest. Patient denies any shortness of breath.  Objective: Filed Vitals:   11/07/14 0800  BP: 130/78  Pulse: 104  Temp: 98.6 F (37 C)  Resp: 21    Intake/Output Summary (Last 24 hours) at 11/07/14 0849 Last data filed at 11/07/14 0745  Gross per 24 hour  Intake    240 ml  Output    615 ml  Net   -375 ml   There were no vitals filed for this visit.  Exam:   General:  NAD  Cardiovascular: RRR  Respiratory: CTAB  Abdomen: Soft/NT/ND/+BS  Musculoskeletal: No c/c/e  Data  Reviewed: Basic Metabolic Panel:  Recent Labs Lab 11/05/14 0805 11/06/14 0909  NA 139 140  K 3.9 3.9  CL 104 104  CO2 28 29  GLUCOSE 106* 101*  BUN 25* 20  CREATININE 1.04 1.05  CALCIUM 8.9 8.9  MG  --  1.9   Liver Function Tests:  Recent Labs Lab 11/05/14 0805  AST 22  ALT 16  ALKPHOS 73  BILITOT 0.6  PROT 6.7  ALBUMIN 4.4   No results for input(s): LIPASE, AMYLASE in the last 168 hours. No results for input(s): AMMONIA in the last 168 hours. CBC:  Recent Labs Lab 11/05/14 0805 11/06/14 0909  WBC 16.5* 13.1*  NEUTROABS 14.8* 10.7*  HGB 14.8 14.4  HCT 47.2* 45.6  MCV 92.5 91.0  PLT 237 227   Cardiac Enzymes: No results for input(s): CKTOTAL, CKMB, CKMBINDEX, TROPONINI in the last 168 hours. BNP (last 3 results)  Recent Labs  10/08/14 0255  BNP 34.1    ProBNP (last 3 results) No results for input(s): PROBNP in the last 8760 hours.  CBG: No results for input(s): GLUCAP in the last 168 hours.  Recent Results (from the past 240 hour(s))  Urine culture     Status: None   Collection Time: 11/05/14  7:36 AM  Result Value Ref Range Status   Specimen Description URINE, CATHETERIZED  Final   Special Requests NONE  Final   Colony Count NO GROWTH Performed at [facility]   Final   Culture NO GROWTH Performed at [facility]   Final   Report Status 11/06/2014 FINAL  Final  Surgical pcr screen     Status: None   Collection Time: 11/05/14  10:40 PM  Result Value Ref Range Status   MRSA, PCR NEGATIVE NEGATIVE Final   Staphylococcus aureus NEGATIVE NEGATIVE Final    Comment:        The Xpert SA Assay (FDA approved for NASAL specimens in patients over 53 years of age), is one component of a comprehensive surveillance program.  Test performance has been validated by [facility] for patients greater than or equal to 76 year old. It is not intended to diagnose infection nor to guide or monitor treatment.      Studies: Ct Head Wo  Contrast  11/05/2014   CLINICAL DATA:  Fall, found on floor covered in urine at 0615 hours today dementia, LEFT hip pain, hypertension, Alzheimer's  EXAM: CT HEAD WITHOUT CONTRAST  CT CERVICAL SPINE WITHOUT CONTRAST  TECHNIQUE: Multidetector CT imaging of the head and cervical spine was performed following the standard protocol without intravenous contrast. Multiplanar CT image reconstructions of the cervical spine were also generated.  COMPARISON:  10/07/2014  FINDINGS: CT HEAD FINDINGS  Generalized atrophy.  Normal ventricular morphology.  No midline shift or mass effect.  Small vessel chronic ischemic changes of deep cerebral white matter.  No intracranial hemorrhage, mass lesion, or acute infarction.  Visualized paranasal sinuses and mastoid air cells clear.  Bones unremarkable.  CT CERVICAL SPINE FINDINGS  Beam hardening artifacts of dental origin and from LEFT shoulder prosthesis.  RIGHT thyroid mass 4.1 x 4.1 x 2.5 cm image 63.  Visualized skullbase intact.  Diffuse osseous demineralization.  Prevertebral soft tissues normal thickness.  Disc space narrowing with endplate spur formation at C5-C6.  Multilevel facet degenerative changes.  Vertebral body heights maintained without fracture or subluxation.  No bone destruction.  Lung apices clear.  IMPRESSION: Atrophy with small vessel chronic ischemic changes of deep cerebral white matter.  No acute intracranial abnormalities.  Degenerative disc and facet disease changes cervical spine.  No acute cervical spine abnormalities.  RIGHT thyroid mass 4.1 x 4.1 x 2.5 cm; dedicated thyroid ultrasound followup recommended.   Electronically Signed   By: Lavonia Dana M.D.   On: 11/05/2014 10:46   Ct Cervical Spine Wo Contrast  11/05/2014   CLINICAL DATA:  Fall, found on floor covered in urine at 0615 hours today dementia, LEFT hip pain, hypertension, Alzheimer's  EXAM: CT HEAD WITHOUT CONTRAST  CT CERVICAL SPINE WITHOUT CONTRAST  TECHNIQUE: Multidetector CT imaging of  the head and cervical spine was performed following the standard protocol without intravenous contrast. Multiplanar CT image reconstructions of the cervical spine were also generated.  COMPARISON:  10/07/2014  FINDINGS: CT HEAD FINDINGS  Generalized atrophy.  Normal ventricular morphology.  No midline shift or mass effect.  Small vessel chronic ischemic changes of deep cerebral white matter.  No intracranial hemorrhage, mass lesion, or acute infarction.  Visualized paranasal sinuses and mastoid air cells clear.  Bones unremarkable.  CT CERVICAL SPINE FINDINGS  Beam hardening artifacts of dental origin and from LEFT shoulder prosthesis.  RIGHT thyroid mass 4.1 x 4.1 x 2.5 cm image 63.  Visualized skullbase intact.  Diffuse osseous demineralization.  Prevertebral soft tissues normal thickness.  Disc space narrowing with endplate spur formation at C5-C6.  Multilevel facet degenerative changes.  Vertebral body heights maintained without fracture or subluxation.  No bone destruction.  Lung apices clear.  IMPRESSION: Atrophy with small vessel chronic ischemic changes of deep cerebral white matter.  No acute intracranial abnormalities.  Degenerative disc and facet disease changes cervical spine.  No acute cervical spine  abnormalities.  RIGHT thyroid mass 4.1 x 4.1 x 2.5 cm; dedicated thyroid ultrasound followup recommended.   Electronically Signed   By: Lavonia Dana M.D.   On: 11/05/2014 10:46   US Soft Tissue Head/neck  11/07/2014   CLINICAL DATA:  81 year old female with a history of thyroid mass.  EXAM: THYROID ULTRASOUND  TECHNIQUE: Ultrasound examination of the thyroid gland and adjacent soft tissues was performed.  COMPARISON:  None.  FINDINGS: Right thyroid lobe  Measurements: 5.7 cm x 3.5 cm x 3.0 cm. Solid right thyroid nodule measures 4.0 cm x 2.6 cm x 2.6 cm  Left thyroid lobe  Measurements: 3.5 cm x 1.4 cm x 1.4 cm.  No nodules visualized.  Isthmus  Thickness: 4 mm.  No nodules visualized.  Lymphadenopathy   None visualized.  IMPRESSION: Single right-sided nodule which meets criteria for biopsy.  Ultrasound-guided fine needle aspiration should be considered, as per the consensus statement: Management of Thyroid Nodules Detected at [place]: Society of Radiologists in [place]. Radiology 2005; [id number].  Signed,  Dulcy Fanny. Earleen Newport, DO  [facility]  [facility]   Electronically Signed   By: Corrie Mckusick D.O.   On: 11/07/2014 08:17    Scheduled Meds: . lactose free nutrition  237 mL Oral BID BM  . meloxicam  7.5 mg Oral Q breakfast  . pantoprazole  40 mg Oral Q0600  . PARoxetine  40 mg Oral Daily  . zolpidem  5 mg Oral QHS   Continuous Infusions: . sodium chloride 50 mL/hr at 11/05/14 1453    Principal Problem:   Closed left hip fracture Active Problems:   LBBB (left bundle branch block), rate related   Alzheimer's dementia   COPD with emphysema   Thyroid mass of unclear etiology: per CT c spine 11/05/14    Time spent: 40 minutes    [facility] MD [facility] Pager [phone]. If 7PM-7AM, please contact night-coverage at [email or web address], password [facility] 11/07/2014, 8:49 AM  LOS: 2 days

## 2014-11-07 NOTE — Anesthesia Preprocedure Evaluation (Addendum)
Anesthesia Evaluation  Patient identified by MRN, date of birth, ID band Patient awake and Patient confused    Reviewed: Allergy & Precautions, NPO status , Patient's Chart, lab work & pertinent test results  Airway Mallampati: II  TM Distance: >3 FB Neck ROM: Full    Dental  (+) Dental Advisory Given, Teeth Intact   Pulmonary shortness of breath, COPDformer smoker,  breath sounds clear to auscultation        Cardiovascular hypertension, + CAD + pacemaker Rhythm:regular Rate:Normal     Neuro/Psych PSYCHIATRIC DISORDERS Anxiety Depression    GI/Hepatic   Endo/Other    Renal/GU      Musculoskeletal  (+) Arthritis -,   Abdominal   Peds  Hematology   Anesthesia Other Findings   Reproductive/Obstetrics                           Anesthesia Physical Anesthesia Plan  ASA: III  Anesthesia Plan: General   Post-op Pain Management:    Induction: Intravenous  Airway Management Planned: Oral ETT  Additional Equipment:   Intra-op Plan:   Post-operative Plan: Extubation in OR  Informed Consent: I have reviewed the patients History and Physical, chart, labs and discussed the procedure including the risks, benefits and alternatives for the proposed anesthesia with the patient or authorized representative who has indicated his/her understanding and acceptance.   Dental advisory given  Plan Discussed with: CRNA, Anesthesiologist and Surgeon  Anesthesia Plan Comments:         Anesthesia Quick Evaluation

## 2014-11-07 NOTE — Op Note (Signed)
Left hip anterior hemiarthroplasty  Procedure Note Laura [name]   [id number]  Pre-op Diagnosis: left femoral neck fracture     Post-op Diagnosis: same   Operative Procedures  1. Prosthetic replacement for femoral neck fracture. CPT [phone]  Personnel  Surgeon(s): Laura Ephriam Jenkins, MD   Anesthesia: general  Prosthesis: Depuy Femur: Corail KA 12 Head: 46 mm size: +8.5 Bearing Type: Bipolar  Date of Service: 11/05/2014 - 11/07/2014  Hip Hemiarthroplasty (Anterior Approach) Op Note:  After informed consent was obtained and the operative extremity marked in the holding area, the patient was brought back to the operating room and placed supine on the HANA table. Next, the operative extremity was prepped and draped in normal sterile fashion. Surgical timeout occurred verifying patient identification, surgical site, surgical procedure and administration of antibiotics.  A modified anterior Smith-Peterson approach to the hip was performed, using the interval between tensor fascia lata and sartorius.  Dissection was carried bluntly down onto the anterior hip capsule. The lateral femoral circumflex vessels were identified and coagulated. A capsulotomy was performed and the capsular flaps tagged for later repair.  Fluoroscopy was utilized to prepare for the femoral neck cut. The neck osteotomy was performed. The femoral head was removed and found a 46 mm head was the appropriate fit.    We then turned our attention to the femur.  After placing the femoral hook, the leg was taken to externally rotated, extended and adducted position taking care to perform soft tissue releases to allow for adequate mobilization of the femur. Soft tissue was cleared from the shoulder of the greater trochanter and the hook elevator used to improve exposure of the proximal femur. Sequential broaching performed up to a size 12. Trial neck and head were placed. The leg was brought back up to neutral and the construct  reduced. The position and sizing of components, offset and leg lengths were checked using fluoroscopy. Stability of the construct was checked in extension and external rotation without any subluxation or impingement of prosthesis. We dislocated the prosthesis, dropped the leg back into position, removed trial components, and irrigated copiously. The final stem and head was then placed, the leg brought back up, the system reduced and fluoroscopy used to verify positioning.  We irrigated, obtained hemostasis and closed the capsule using #1 ethibond suture.  The fascia was closed with #1 vicryl plus, the deep fat layer was closed with 0 vicryl, the subcutaneous layers closed with 2.0 Vicryl Plus and the skin closed with staples . A sterile dressing was applied. The patient was awakened in the operating room and taken to recovery in stable condition. All sponge, needle, and instrument counts were correct at the end of the case.   Position: supine  Complications: none.  Time Out: performed   Drains/Packing: none  Estimated blood loss: 300 cc  Returned to Recovery Room: in good condition.   Antibiotics: yes   Mechanical VTE (DVT) Prophylaxis: sequential compression devices, TED thigh-high  Chemical VTE (DVT) Prophylaxis: lovenox Fluid Replacement  Crystalloid: see anesthesia record  Specimens Removed: 1 to pathology   Sponge and Instrument Count Correct? yes   PACU: portable radiograph - low AP   Admission: inpatient status, start PT & OT POD#1  Plan/RTC: Return in 2 weeks for staple removal. Return in 6 weeks to see MD.  Weight Bearing/Load Lower Extremity: full  Hip precautions: none Suture Removal: 10-14 days  Betadine to incision twice daily once dressing is removed on POD#7  N. Laura [name]  Laura Hong, MD [place] [phone] 2:04 PM     Implant Name Type Inv. Item Serial No. Manufacturer Lot No. LRB No. Used  HIP BALL ARTICU DEPUY - [id number] Hips HIP BALL ARTICU DEPUY   DEPUY [id number] Left 1  BIPOLAR PROS AML 46MM - [id number] Hips BIPOLAR PROS AML 46MM  DEPUY [id number] Left 1  STEM CORAIL KA12 - [id number] Stem STEM CORAIL KA12   DEPUY [id number] Left 1

## 2014-11-07 NOTE — Anesthesia Postprocedure Evaluation (Signed)
Anesthesia Post Note  Patient: Laura [name]  Procedure(s) Performed: Procedure(s) (LRB): Left hip anterior hemiarthroplasty (Left)  Anesthesia type: General  Patient location: PACU  Post pain: Pain level controlled and Adequate analgesia  Post assessment: Post-op Vital signs reviewed, Patient's Cardiovascular Status Stable, Respiratory Function Stable, Patent Airway and Pain level controlled  Last Vitals:  Filed Vitals:   11/07/14 1501  BP: 176/91  Pulse: 109  Temp:   Resp: 21    Post vital signs: Reviewed and stable  Level of consciousness: awake, alert  and oriented  Complications: No apparent anesthesia complications

## 2014-11-07 NOTE — Anesthesia Procedure Notes (Signed)
Procedure Name: Intubation Date/Time: 11/07/2014 12:37 PM Performed by: Garrison Columbus T Pre-anesthesia Checklist: Patient identified, Emergency Drugs available, Suction available and Patient being monitored Patient Re-evaluated:Patient Re-evaluated prior to inductionOxygen Delivery Method: Circle system utilized Preoxygenation: Pre-oxygenation with 100% oxygen Intubation Type: IV induction Ventilation: Mask ventilation without difficulty Laryngoscope Size: Miller and 2 Grade View: Grade I Tube type: Oral Tube size: 7.5 mm Number of attempts: 1 Airway Equipment and Method: Stylet Placement Confirmation: ETT inserted through vocal cords under direct vision,  positive ETCO2 and breath sounds checked- equal and bilateral Secured at: 21 cm Tube secured with: Tape Dental Injury: Teeth and Oropharynx as per pre-operative assessment

## 2014-11-07 NOTE — H&P (Signed)

## 2014-11-08 ENCOUNTER — Inpatient Hospital Stay (HOSPITAL_COMMUNITY): Payer: Medicare Other

## 2014-11-08 ENCOUNTER — Encounter (HOSPITAL_COMMUNITY): Payer: Self-pay | Admitting: Orthopaedic Surgery

## 2014-11-08 DIAGNOSIS — G9340 Encephalopathy, unspecified: Secondary | ICD-10-CM

## 2014-11-08 DIAGNOSIS — R1310 Dysphagia, unspecified: Secondary | ICD-10-CM

## 2014-11-08 LAB — VITAMIN B12: VITAMIN B 12: 239 pg/mL (ref 211–911)

## 2014-11-08 LAB — BASIC METABOLIC PANEL
Anion gap: 11 (ref 5–15)
BUN: 18 mg/dL (ref 6–23)
CALCIUM: 7.8 mg/dL — AB (ref 8.4–10.5)
CO2: 23 mmol/L (ref 19–32)
Chloride: 107 mmol/L (ref 96–112)
Creatinine, Ser: 1.05 mg/dL (ref 0.50–1.10)
GFR calc Af Amer: 53 mL/min — ABNORMAL LOW (ref >90)
GFR calc non Af Amer: 46 mL/min — ABNORMAL LOW (ref >90)
GLUCOSE: 97 mg/dL (ref 70–99)
Potassium: 4.5 mmol/L (ref 3.5–5.1)
SODIUM: 141 mmol/L (ref 135–145)

## 2014-11-08 LAB — URINALYSIS, ROUTINE W REFLEX MICROSCOPIC
Bilirubin Urine: NEGATIVE
Glucose, UA: NEGATIVE mg/dL
HGB URINE DIPSTICK: NEGATIVE
Ketones, ur: 15 mg/dL — AB
LEUKOCYTES UA: NEGATIVE
NITRITE: NEGATIVE
PH: 5 (ref 5.0–8.0)
Protein, ur: NEGATIVE mg/dL
SPECIFIC GRAVITY, URINE: 1.022 (ref 1.005–1.030)
Urobilinogen, UA: 0.2 mg/dL (ref 0.0–1.0)

## 2014-11-08 LAB — CBC
HEMATOCRIT: 36.3 % (ref 36.0–46.0)
HEMOGLOBIN: 11.2 g/dL — AB (ref 12.0–15.0)
MCH: 28.6 pg (ref 26.0–34.0)
MCHC: 30.9 g/dL (ref 30.0–36.0)
MCV: 92.6 fL (ref 78.0–100.0)
Platelets: 183 10*3/uL (ref 150–400)
RBC: 3.92 MIL/uL (ref 3.87–5.11)
RDW: 14.3 % (ref 11.5–15.5)
WBC: 9.9 10*3/uL (ref 4.0–10.5)

## 2014-11-08 NOTE — Progress Notes (Signed)
PT Cancellation Note  Patient Details Name: Laura [name] MRN: [id number] DOB: Aug 08, 1923   Cancelled Treatment:    Reason Eval/Treat Not Completed: Patient at procedure or test/unavailable Patient taken off unit for CT imaging. Will follow up for evaluation as time/schedule allows.  Ellouise Newer 11/08/2014, 2:12 PM Camille Bal [place], [place]

## 2014-11-08 NOTE — Evaluation (Signed)
Physical Therapy Evaluation Patient Details Name: Laura [name] MRN: [id number] DOB: May 10, 1924 Today's Date: 11/08/2014   History of Present Illness  81 y.o. female with significant history of dementia had a fall resulting in Lt hip fracture. s/p Lt hip Hemiarthroplasty (Anterior Approach)  Clinical Impression  Patient is s/p above surgery presenting with functional limitations due to the deficits listed below (see PT Problem List). Tolerated passive/active assist therapeutic exercises in bed and transition from supine<>sit with max assist onto edge of bed. Limited participation due to difficulty following commands. Was very pleasant during therapy session. Patient will benefit from skilled PT to increase their independence and safety with mobility to allow discharge to the venue listed below.       Follow Up Recommendations SNF;Supervision/Assistance - 24 hour    Equipment Recommendations  None recommended by PT    Recommendations for Other Services       Precautions / Restrictions Precautions Precautions: Fall Restrictions Weight Bearing Restrictions: Yes LLE Weight Bearing: Weight bearing as tolerated      Mobility  Bed Mobility Overal bed mobility: Needs Assistance Bed Mobility: Supine to Sit;Sit to Supine     Supine to sit: Max assist;HOB elevated Sit to supine: Max assist   General bed mobility comments: Max assist for truncal control, LLE support, use of bed pad to scoot, and facilitory techniques for patient to assume seated positiong. Once sitting requires min assist to maintain seated balance as she leans posteriorly.  Patient was able to flex trunk and pull through BIL UEs to assist with transition. Requies same support to transistion to supine position.  Transfers                    Ambulation/Gait                Stairs            Wheelchair Mobility    Modified Rankin (Stroke Patients Only)       Balance Overall balance  assessment: Needs assistance;History of Falls Sitting-balance support: Bilateral upper extremity supported;Feet supported Sitting balance-Leahy Scale: Poor Sitting balance - Comments: sat edge of bed x3 minutes with hands in lap, requires min assist for posterior loss of balance. Postural control: Posterior lean                                   Pertinent Vitals/Pain Pain Assessment: Faces Faces Pain Scale: Hurts little more Pain Descriptors / Indicators: Grimacing;Guarding Pain Intervention(s): Monitored during session;Repositioned    Home Living Family/patient expects to be discharged to:: Unsure Living Arrangements:  (From dementia living unit) Available Help at Discharge: [place] Type of Home: Other(Comment) (Dementia living unit at facility)         Home Equipment: Gilford Rile - 2 wheels Additional Comments: Information taken from notes and prior history as pt is a poor historian    Prior Function Level of Independence: Needs assistance         Comments: During prvious admission, notes indicate patient was ambulatory with a rolling walker.      Hand Dominance        Extremity/Trunk Assessment   Upper Extremity Assessment: Defer to OT evaluation           Lower Extremity Assessment: Difficult to assess due to impaired cognition         Communication   Communication: Other (comment) (advanced dementia - limited  communication)  Cognition Arousal/Alertness: Awake/alert Behavior During Therapy: Flat affect Overall Cognitive Status: History of cognitive impairments - at baseline (advanced dementia)       Memory: Decreased short-term memory              General Comments General comments (skin integrity, edema, etc.): Heels left floating, LEs elevated HOB 30 degrees. SpO2 87-92% on 3L supplemental O2, HR 118-140 during therapy    Exercises Total Joint Exercises Ankle Circles/Pumps: AAROM;PROM;Both;10 reps;Supine Heel  Slides: PROM;Both;10 reps;Supine;AAROM Hip ABduction/ADduction: PROM;AAROM;Both;10 reps;Supine      Assessment/Plan    PT Assessment Patient needs continued PT services  PT Diagnosis Difficulty walking;Acute pain;Altered mental status   PT Problem List Decreased strength;Decreased range of motion;Decreased activity tolerance;Decreased balance;Decreased mobility;Decreased cognition;Decreased knowledge of use of DME;Decreased safety awareness;Cardiopulmonary status limiting activity;Pain  PT Treatment Interventions DME instruction;Gait training;Functional mobility training;Therapeutic activities;Therapeutic exercise;Balance training;Neuromuscular re-education;Cognitive remediation;Patient/family education;Modalities   PT Goals (Current goals can be found in the Care Plan section) Acute Rehab PT Goals Patient Stated Goal: none stated PT Goal Formulation: Patient unable to participate in goal setting Time For Goal Achievement: 11/15/14 Potential to Achieve Goals: Fair    Frequency Min 3X/week   Barriers to discharge        Co-evaluation               End of Session Equipment Utilized During Treatment: Oxygen (3L) Activity Tolerance: Other (comment) (Limited by poor ability to follow commands.) Patient left: in bed;with call bell/phone within reach;with bed alarm set;with SCD's reapplied Nurse Communication: Mobility status         Time: 0947-0962 PT Time Calculation (min) (ACUTE ONLY): 28 min   Charges:   PT Evaluation $Initial PT Evaluation Tier I: 1 Procedure PT Treatments $Therapeutic Activity: 8-22 mins   PT G CodesEllouise Newer 11/08/2014, 5:02 PM Camille Bal [place], [place]

## 2014-11-08 NOTE — Evaluation (Signed)
Clinical/Bedside Swallow Evaluation Patient Details  Name: Laura [name] MRN: [id number] Date of Birth: February 26, 1924  Today's Date: 11/08/2014 Time: SLP Start Time (ACUTE ONLY): 4098 SLP Stop Time (ACUTE ONLY): 1225 SLP Time Calculation (min) (ACUTE ONLY): 40 min  Past Medical History:  Past Medical History  Diagnosis Date  . Hypertension   . Arthritis   . Alzheimer's dementia   . Anxiety   . Depression    Past Surgical History:  Past Surgical History  Procedure Laterality Date  . Shoulder surgery      Left   . Loop recorder implant N/A 06/22/2012    Procedure: LOOP RECORDER IMPLANT;  Surgeon: Sanda Klein, MD;  Location: [place] CATH LAB;  Service: Cardiovascular;  Laterality: N/A;  . Total hip arthroplasty Left 11/07/2014    Procedure: Left hip anterior hemiarthroplasty;  Surgeon: Leandrew Koyanagi, MD;  Location: [place];  Service: Orthopedics;  Laterality: Left;   HPI:  This 81 year old female was admitted after a fall, which resulted in left hip fracture.  Pt is 1 day post-op surgery.  PMH:  Advanced Alzheimer's Dementia, HTN, COPD.  Pt was living in a Memory Care Unit per friend.     Assessment / Plan / Recommendation Clinical Impression  Pt exhibits a cognitive based oral dysphagia, with difficulty chewing solids and moving bolus posteriorly for swallow.  Pt tends to hold bolus in mouth, requiring max verbal and tactile cues to swallow.  Pt does not appear to have an appetite, and does not seem interested in eating.  Max encouragement and cues are required.  Pt was unable to draw liquids into the straw to swallow (appears to have forgotten what a straw is used for, and how to use it).  There were moderate deficits in the oral-preparatory phase, but no overt s/s of aspiration and no overt evidence of pharyngeal dysphagia.  Pt is currently a full code.  Family friend states he is pt's POA, but states "that is up to the doctor, isn't it?").  He wishes to discuss code status with MD.     Aspiration Risk  Mild    Diet Recommendation Dysphagia 2 (Fine chop);Thin liquid   Liquid Administration via: Cup;No straw Medication Administration: Crushed with puree Supervision: Staff to assist with self feeding;Full supervision/cueing for compensatory strategies Compensations: Slow rate;Small sips/bites;Check for pocketing Postural Changes and/or Swallow Maneuvers: Seated upright 90 degrees    Other  Recommendations Oral Care Recommendations: Oral care BID;Staff/trained caregiver to provide oral care   Follow Up Recommendations  Skilled Nursing facility    Frequency and Duration min 2x/week  2 weeks   Pertinent Vitals/Pain Pt denies pain         Swallow Study Prior Functional Status       General HPI: This 81 year old female was admitted after a fall, which resulted in left hip fracture.  Pt is 1 day post-op surgery.  PMH:  Advanced Alzheimer's Dementia, HTN, COPD.  Pt was living in a Memory Care Unit per friend.   Type of Study: Bedside swallow evaluation Previous Swallow Assessment: none Diet Prior to this Study: Regular;Thin liquids Temperature Spikes Noted: No Respiratory Status: Room air History of Recent Intubation: Yes Length of Intubations (days): 1 days Date extubated: 11/07/14 Behavior/Cognition: Alert;Pleasant mood;Doesn't follow directions;Distractible;Decreased sustained attention Oral Cavity - Dentition: Adequate natural dentition Self-Feeding Abilities: Needs assist Patient Positioning: Upright in bed Baseline Vocal Quality: Clear Volitional Cough: Cognitively unable to elicit Volitional Swallow: Unable to elicit  Oral/Motor/Sensory Function Overall Oral Motor/Sensory Function: Appears within functional limits for tasks assessed (pt unable to f/c's for o-m exam)   Ice Chips Ice chips: Within functional limits Presentation: Spoon   Thin Liquid Thin Liquid: Within functional limits Presentation: Spoon;Cup;Straw (unable to draw water into straw)     Nectar Thick Nectar Thick Liquid: Not tested   Honey Thick Honey Thick Liquid: Not tested   Puree Puree: Within functional limits Presentation: Spoon   Solid   GO    Solid: Impaired Oral Phase Impairments: Impaired anterior to posterior transit;Poor awareness of bolus;Impaired mastication;Reduced lingual movement/coordination Oral Phase Functional Implications: Oral holding       Quinn Axe T 11/08/2014,1:44 PM

## 2014-11-08 NOTE — Progress Notes (Signed)
OT Cancellation Note  Patient Details Name: Laura [name] MRN: [id number] DOB: Sep 11, 1923   Cancelled Treatment:    Reason Eval/Treat Not Completed: Other (comment). Pt is from ALF and has Medicare and current D/C plan is SNF. No apparent immediate acute care OT needs, therefore will defer OT to SNF. If OT eval is needed please call Acute Rehab Dept. at [phone] or text page OT at [phone].    Villa Herb M  Cyndie Chime, OTR/L Occupational Therapist [phone] (pager)  11/08/2014, 4:39 PM

## 2014-11-08 NOTE — Progress Notes (Signed)
[facility] PROGRESS NOTE  Laura [name] DJM:[id number] DOB: 08/20/1923 DOA: 11/05/2014 PCP: Sande Brothers, MD  Assessment/Plan: #1 left femoral neck fracture Secondary to mechanical fall. Patient status post prostatic replacement for femoral neck fracture 11/07/2014. PT/OT. Will likely need skilled nursing facility.  Continue pain management. Per orthopedics.  #2 Alzheimer's dementia/??encephalopathy Patient with advanced dementia. Patient resides in a dementia unit. Patient pleasantly confused. Per nursing patient more confused today. Head CT was repeated which was negative for any acute abnormalities. Will repeat a UA with cultures and sensitivities. Check RPR, B-12, RBC folate. Follow.  #3 COPD and emphysema Stable.  #4 leukocytosis Likely reactive in nature. Chest x-ray negative. Urinalysis is negative. Leukocytosis trending down. Follow.  #5 thyroid mass Per CT. free T4 within normal limits. Free T3 within normal limits. Thyroid ultrasound with single right sided nodule. Will likely need outpatient FNA. Outpatient follow up.   #6 prophylaxis PPI for GI prohpylaxis. Heparin for DVT prophylaxis.  Code Status: Full Family Communication: Updated patient and HCPOA at bedside. Disposition Plan: Pending PT evaluation. Likely need skilled nursing facility.   Consultants:  Orthopedics: Dr. [name] 11/05/2014  Procedures:  CT C-spine and head 11/05/2014  X-ray of the knee 11/05/2014  Chest x-ray 11/05/2014  Thyroid ultrasound 11/06/2014  CT head 11/08/14  Antibiotics:  None  HPI/Subjective: Patient sleeping however easily arousable. Patient pleasantly confused. Patient denies any pain. Patient denies any chest. Patient denies any shortness of breath.  Objective: Filed Vitals:   11/08/14 0559  BP: 128/61  Pulse: 115  Temp: 99.6 F (37.6 C)  Resp: 16    Intake/Output Summary (Last 24 hours) at 11/08/14 1034 Last data filed at 11/08/14 0600  Gross per 24  hour  Intake   1100 ml  Output   1300 ml  Net   -200 ml   There were no vitals filed for this visit.  Exam:   General:  NAD  Cardiovascular: RRR  Respiratory: CTAB  Abdomen: Soft/NT/ND/+BS  Musculoskeletal: No c/c/e  Data Reviewed: Basic Metabolic Panel:  Recent Labs Lab 11/05/14 0805 11/06/14 0909 11/07/14 1815 11/08/14 0652  NA 139 140  --  141  K 3.9 3.9  --  4.5  CL 104 104  --  107  CO2 28 29  --  23  GLUCOSE 106* 101*  --  97  BUN 25* 20  --  18  CREATININE 1.04 1.05 1.10 1.05  CALCIUM 8.9 8.9  --  7.8*  MG  --  1.9  --   --    Liver Function Tests:  Recent Labs Lab 11/05/14 0805  AST 22  ALT 16  ALKPHOS 73  BILITOT 0.6  PROT 6.7  ALBUMIN 4.4   No results for input(s): LIPASE, AMYLASE in the last 168 hours. No results for input(s): AMMONIA in the last 168 hours. CBC:  Recent Labs Lab 11/05/14 0805 11/06/14 0909 11/07/14 1815 11/08/14 0652  WBC 16.5* 13.1* 14.1* 9.9  NEUTROABS 14.8* 10.7*  --   --   HGB 14.8 14.4 12.2 11.2*  HCT 47.2* 45.6 39.8 36.3  MCV 92.5 91.0 93.6 92.6  PLT 237 227 202 183   Cardiac Enzymes: No results for input(s): CKTOTAL, CKMB, CKMBINDEX, TROPONINI in the last 168 hours. BNP (last 3 results)  Recent Labs  10/08/14 0255  BNP 34.1    ProBNP (last 3 results) No results for input(s): PROBNP in the last 8760 hours.  CBG: No results for input(s): GLUCAP in the last 168 hours.  Recent  Results (from the past 240 hour(s))  Urine culture     Status: None   Collection Time: 11/05/14  7:36 AM  Result Value Ref Range Status   Specimen Description URINE, CATHETERIZED  Final   Special Requests NONE  Final   Colony Count NO GROWTH Performed at [facility]   Final   Culture NO GROWTH Performed at [facility]   Final   Report Status 11/06/2014 FINAL  Final  Surgical pcr screen     Status: None   Collection Time: 11/05/14 10:40 PM  Result Value Ref Range Status   MRSA, PCR NEGATIVE  NEGATIVE Final   Staphylococcus aureus NEGATIVE NEGATIVE Final    Comment:        The Xpert SA Assay (FDA approved for NASAL specimens in patients over 86 years of age), is one component of a comprehensive surveillance program.  Test performance has been validated by [facility] for patients greater than or equal to 64 year old. It is not intended to diagnose infection nor to guide or monitor treatment.      Studies: US Soft Tissue Head/neck  11/07/2014   CLINICAL DATA:  81 year old female with a history of thyroid mass.  EXAM: THYROID ULTRASOUND  TECHNIQUE: Ultrasound examination of the thyroid gland and adjacent soft tissues was performed.  COMPARISON:  None.  FINDINGS: Right thyroid lobe  Measurements: 5.7 cm x 3.5 cm x 3.0 cm. Solid right thyroid nodule measures 4.0 cm x 2.6 cm x 2.6 cm  Left thyroid lobe  Measurements: 3.5 cm x 1.4 cm x 1.4 cm.  No nodules visualized.  Isthmus  Thickness: 4 mm.  No nodules visualized.  Lymphadenopathy  None visualized.  IMPRESSION: Single right-sided nodule which meets criteria for biopsy.  Ultrasound-guided fine needle aspiration should be considered, as per the consensus statement: Management of Thyroid Nodules Detected at [place]: Society of Radiologists in [place]. Radiology 2005; [id number].  Signed,  Dulcy Fanny. Earleen Newport, DO  [facility]  [facility]   Electronically Signed   By: Corrie Mckusick D.O.   On: 11/07/2014 08:17   Pelvis Portable  11/07/2014   CLINICAL DATA:  Status post left hip replacement  EXAM: PORTABLE PELVIS 1-2 VIEWS  COMPARISON:  None.  FINDINGS: A left hip prosthesis is noted. No acute abnormality is noted. The pelvic ring is intact. A calcified uterine fibroid is seen.  IMPRESSION: Status post left hip replacement, no acute abnormality noted.   Electronically Signed   By: Inez Catalina M.D.   On: 11/07/2014 16:26   Dg Hip Operative Unilat With Pelvis  Left  11/07/2014   CLINICAL DATA:  Subcapital femoral neck fracture  EXAM: OPERATIVE LEFT HIP WITH PELVIS  COMPARISON:  None.  FLUOROSCOPY TIME:  Radiation Exposure Index (as provided by the fluoroscopic device): Not available  If the device does not provide the exposure index:  Fluoroscopy Time:  32 seconds  Number of Acquired Images:  Or  FINDINGS: The left subcapital femoral neck fracture is again noted left hip replacement was then performed. No acute abnormality is noted.  IMPRESSION: Status post left hip replacement   Electronically Signed   By: Inez Catalina M.D.   On: 11/07/2014 14:15    Scheduled Meds: . enoxaparin (LOVENOX) injection  40 mg Subcutaneous Q24H  . lactose free nutrition  237 mL Oral BID BM  . meloxicam  7.5 mg Oral Q breakfast  . pantoprazole  40 mg Oral Q0600  .  PARoxetine  40 mg Oral Daily  . zolpidem  5 mg Oral QHS   Continuous Infusions:    Principal Problem:   Closed left hip fracture Active Problems:   LBBB (left bundle branch block), rate related   Alzheimer's dementia   COPD with emphysema   Thyroid mass of unclear etiology: per CT c spine 11/05/14   Left hip pain   Thyroid mass   Acute encephalopathy   Dysphagia    Time spent: 40 minutes    [facility] MD [facility] Pager [phone]. If 7PM-7AM, please contact night-coverage at [email or web address], password [facility] 11/08/2014, 10:34 AM  LOS: 3 days

## 2014-11-08 NOTE — Progress Notes (Addendum)
   Subjective:  Patient reports pain as mild.    Objective:   VITALS:   Filed Vitals:   11/07/14 2356 11/08/14 0002 11/08/14 0400 11/08/14 0559  BP:  141/69  128/61  Pulse:  125  115  Temp:  99 F (37.2 C)  99.6 F (37.6 C)  TempSrc:  Oral  Oral  Resp: 18 16 18 16   SpO2:  97%  95%    Neurovascular intact Sensation intact distally Intact pulses distally Dorsiflexion/Plantar flexion intact Incision: dressing C/D/I and no drainage No cellulitis present Compartment soft   Lab Results  Component Value Date   WBC 9.9 11/08/2014   HGB 11.2* 11/08/2014   HCT 36.3 11/08/2014   MCV 92.6 11/08/2014   PLT 183 11/08/2014     Assessment/Plan:  1 Day Post-Op   - Expected postop acute blood loss anemia - will monitor for symptoms - Up with PT/OT - DVT ppx - SCDs, ambulation, lovenox - WBAT left lower extremity, no hip precautions - SNF pending  Marianna Payment 11/08/2014, 10:00 AM [phone]

## 2014-11-08 NOTE — Care Management Note (Signed)
CARE MANAGEMENT NOTE  11/08/2014   Patient:  Laura [name]   Account Number:  [id number]  Date Initiated:  11/08/2014  Documentation initiated by:  Laura [name]  Subjective/Objective Assessment:   81 yr old female admitted s/p fall with a left hip fracture. Patient underwent a left hip arthroplasty 11/07/14.     Action/Plan:   Patient is for shortterm rehab at [facility]. Social worker is aware.   Anticipated DC Date:  11/09/2014   Anticipated DC Plan:  SKILLED NURSING FACILITY  In-house referral  Clinical Social Worker      DC Planning Services  CM consult      [facility] Choice  NA   Choice offered to / List presented to:     DME arranged  NA        [place] arranged  NA      Status of service:  Completed, signed off Medicare Important Message given?   (If response is "NO", the following Medicare IM given date fields will be blank) Date Medicare IM given:   Medicare IM given by:   Date Additional Medicare IM given:   Additional Medicare IM given by:    Discharge Disposition:  [place]  Per UR Regulation:  Reviewed for med. necessity/level of care/duration of stay  If discussed at [place] of Stay Meetings, dates discussed:    Comments:

## 2014-11-09 ENCOUNTER — Inpatient Hospital Stay (HOSPITAL_COMMUNITY): Payer: Medicare Other

## 2014-11-09 LAB — BASIC METABOLIC PANEL
ANION GAP: 8 (ref 5–15)
BUN: 26 mg/dL — ABNORMAL HIGH (ref 6–23)
CALCIUM: 8.3 mg/dL — AB (ref 8.4–10.5)
CO2: 29 mmol/L (ref 19–32)
Chloride: 108 mmol/L (ref 96–112)
Creatinine, Ser: 1.29 mg/dL — ABNORMAL HIGH (ref 0.50–1.10)
GFR calc Af Amer: 42 mL/min — ABNORMAL LOW (ref >90)
GFR, EST NON AFRICAN AMERICAN: 36 mL/min — AB (ref >90)
Glucose, Bld: 103 mg/dL — ABNORMAL HIGH (ref 70–99)
POTASSIUM: 4.1 mmol/L (ref 3.5–5.1)
SODIUM: 145 mmol/L (ref 135–145)

## 2014-11-09 LAB — CBC
HCT: 36.6 % (ref 36.0–46.0)
Hemoglobin: 11.1 g/dL — ABNORMAL LOW (ref 12.0–15.0)
MCH: 28.2 pg (ref 26.0–34.0)
MCHC: 30.3 g/dL (ref 30.0–36.0)
MCV: 93.1 fL (ref 78.0–100.0)
PLATELETS: 198 10*3/uL (ref 150–400)
RBC: 3.93 MIL/uL (ref 3.87–5.11)
RDW: 14.1 % (ref 11.5–15.5)
WBC: 9.4 10*3/uL (ref 4.0–10.5)

## 2014-11-09 LAB — RPR: RPR Ser Ql: NONREACTIVE

## 2014-11-09 NOTE — Progress Notes (Signed)
[facility] PROGRESS NOTE  Laura [name] VQM:[id number] DOB: 1923-12-25 DOA: 11/05/2014 PCP: Sande Brothers, MD  Assessment/Plan: #1 left femoral neck fracture Secondary to mechanical fall. Patient status post prostatic replacement for femoral neck fracture 11/07/2014. PT/OT. Patient has been seen by physical therapy and recommended skilled nursing facility. Continue pain management. Per orthopedics.  #2 Alzheimer's dementia/??encephalopathy Patient with advanced dementia. Patient resides in a dementia unit. Patient pleasantly confused. Patient seems to be at baseline. Head CT was repeated which was negative for any acute abnormalities. Urinalysis was negative. B-12 levels were 239. RPR was nonreactive. Follow.   #3 COPD and emphysema Stable.  #4 leukocytosis Likely reactive in nature. Chest x-ray negative. Urinalysis is negative. Leukocytosis trending down. Follow.  #5 thyroid mass Per CT. free T4 within normal limits. Free T3 within normal limits. Thyroid ultrasound with single right sided nodule, which was an incidental finding per CT. Patient asymptomatic. Case discussed with interventional radiology who recommended surveillance with repeat thyroid ultrasound in 6 months.   #6 prophylaxis PPI for GI prohpylaxis. Heparin for DVT prophylaxis.  Code Status: Full Family Communication: Updated patient and HCPOA at bedside. Disposition Plan: Likely needs skilled nursing facility, hopefully next week.   Consultants:  Orthopedics: Dr. [name] 11/05/2014  Procedures:  CT C-spine and head 11/05/2014  X-ray of the knee 11/05/2014  Chest x-ray 11/05/2014  Thyroid ultrasound 11/06/2014  CT head 11/08/14  Antibiotics:  None  HPI/Subjective: Patient denies any pain. Patient denies any chest. Patient denies any shortness of breath.  Objective: Filed Vitals:   11/09/14 0624  BP: 123/70  Pulse: 111  Temp: 99.6 F (37.6 C)  Resp:     Intake/Output Summary (Last 24  hours) at 11/09/14 0945 Last data filed at 11/08/14 1300  Gross per 24 hour  Intake    120 ml  Output      0 ml  Net    120 ml   There were no vitals filed for this visit.  Exam:   General:  NAD  Cardiovascular: RRR  Respiratory: CTAB  Abdomen: Soft/NT/ND/+BS  Musculoskeletal: No c/c/e  Data Reviewed: Basic Metabolic Panel:  Recent Labs Lab 11/05/14 0805 11/06/14 0909 11/07/14 1815 11/08/14 0652 11/09/14 0535  NA 139 140  --  141 145  K 3.9 3.9  --  4.5 4.1  CL 104 104  --  107 108  CO2 28 29  --  23 29  GLUCOSE 106* 101*  --  97 103*  BUN 25* 20  --  18 26*  CREATININE 1.04 1.05 1.10 1.05 1.29*  CALCIUM 8.9 8.9  --  7.8* 8.3*  MG  --  1.9  --   --   --    Liver Function Tests:  Recent Labs Lab 11/05/14 0805  AST 22  ALT 16  ALKPHOS 73  BILITOT 0.6  PROT 6.7  ALBUMIN 4.4   No results for input(s): LIPASE, AMYLASE in the last 168 hours. No results for input(s): AMMONIA in the last 168 hours. CBC:  Recent Labs Lab 11/05/14 0805 11/06/14 0909 11/07/14 1815 11/08/14 0652 11/09/14 0535  WBC 16.5* 13.1* 14.1* 9.9 9.4  NEUTROABS 14.8* 10.7*  --   --   --   HGB 14.8 14.4 12.2 11.2* 11.1*  HCT 47.2* 45.6 39.8 36.3 36.6  MCV 92.5 91.0 93.6 92.6 93.1  PLT 237 227 202 183 198   Cardiac Enzymes: No results for input(s): CKTOTAL, CKMB, CKMBINDEX, TROPONINI in the last 168 hours. BNP (last 3 results)  Recent Labs  10/08/14 0255  BNP 34.1    ProBNP (last 3 results) No results for input(s): PROBNP in the last 8760 hours.  CBG: No results for input(s): GLUCAP in the last 168 hours.  Recent Results (from the past 240 hour(s))  Urine culture     Status: None   Collection Time: 11/05/14  7:36 AM  Result Value Ref Range Status   Specimen Description URINE, CATHETERIZED  Final   Special Requests NONE  Final   Colony Count NO GROWTH Performed at [facility]   Final   Culture NO GROWTH Performed at [facility]   Final    Report Status 11/06/2014 FINAL  Final  Surgical pcr screen     Status: None   Collection Time: 11/05/14 10:40 PM  Result Value Ref Range Status   MRSA, PCR NEGATIVE NEGATIVE Final   Staphylococcus aureus NEGATIVE NEGATIVE Final    Comment:        The Xpert SA Assay (FDA approved for NASAL specimens in patients over 64 years of age), is one component of a comprehensive surveillance program.  Test performance has been validated by [facility] for patients greater than or equal to 73 year old. It is not intended to diagnose infection nor to guide or monitor treatment.      Studies: Ct Head Wo Contrast  11/08/2014   CLINICAL DATA:  Dementia  EXAM: CT HEAD WITHOUT CONTRAST  TECHNIQUE: Contiguous axial images were obtained from the base of the skull through the vertex without intravenous contrast.  COMPARISON:  4/19/ 16  FINDINGS: No skull fracture is noted. Paranasal sinuses and mastoid air cells are unremarkable. Stable atrophy. Stable periventricular and patchy subcortical chronic white matter disease. Atherosclerotic calcifications of carotid siphon again noted. No acute cortical infarction. No mass lesion is noted on this unenhanced scan. The gray and white-matter differentiation is preserved.  IMPRESSION: No acute intracranial abnormality. Stable atrophy and chronic white matter disease.   Electronically Signed   By: Lahoma Crocker M.D.   On: 11/08/2014 14:06   Pelvis Portable  11/07/2014   CLINICAL DATA:  Status post left hip replacement  EXAM: PORTABLE PELVIS 1-2 VIEWS  COMPARISON:  None.  FINDINGS: A left hip prosthesis is noted. No acute abnormality is noted. The pelvic ring is intact. A calcified uterine fibroid is seen.  IMPRESSION: Status post left hip replacement, no acute abnormality noted.   Electronically Signed   By: Inez Catalina M.D.   On: 11/07/2014 16:26   Dg Hip Operative Unilat With Pelvis Left  11/07/2014   CLINICAL DATA:  Subcapital femoral neck fracture  EXAM: OPERATIVE  LEFT HIP WITH PELVIS  COMPARISON:  None.  FLUOROSCOPY TIME:  Radiation Exposure Index (as provided by the fluoroscopic device): Not available  If the device does not provide the exposure index:  Fluoroscopy Time:  32 seconds  Number of Acquired Images:  Or  FINDINGS: The left subcapital femoral neck fracture is again noted left hip replacement was then performed. No acute abnormality is noted.  IMPRESSION: Status post left hip replacement   Electronically Signed   By: Inez Catalina M.D.   On: 11/07/2014 14:15    Scheduled Meds: . enoxaparin (LOVENOX) injection  40 mg Subcutaneous Q24H  . lactose free nutrition  237 mL Oral BID BM  . meloxicam  7.5 mg Oral Q breakfast  . pantoprazole  40 mg Oral Q0600  . PARoxetine  40 mg Oral Daily  . zolpidem  5  mg Oral QHS   Continuous Infusions:    Principal Problem:   Closed left hip fracture Active Problems:   LBBB (left bundle branch block), rate related   Alzheimer's dementia   COPD with emphysema   Thyroid mass of unclear etiology: per CT c spine 11/05/14   Left hip pain   Thyroid mass   Acute encephalopathy   Dysphagia    Time spent: 40 minutes    [facility] MD [facility] Pager [phone]. If 7PM-7AM, please contact night-coverage at [email or web address], password [facility] 11/09/2014, 9:45 AM  LOS: 4 days

## 2014-11-09 NOTE — Progress Notes (Signed)
Subjective: Pt stable - pain ok   Objective: Vital signs in last 24 hours: Temp:  [99.6 F (37.6 C)-101.1 F (38.4 C)] 99.6 F (37.6 C) (04/23 0624) Pulse Rate:  [111-124] 111 (04/23 0624) Resp:  [16] 16 (04/23 0400) BP: (108-123)/(70-71) 123/70 mmHg (04/23 0624) SpO2:  [94 %-95 %] 95 % (04/23 0624)  Intake/Output from previous day: 04/22 0701 - 04/23 0700 In: 120 [P.O.:120] Out: -  Intake/Output this shift:    Exam:  leg lenghts ok  Labs:  Recent Labs  11/06/14 0909 11/07/14 1815 11/08/14 0652  HGB 14.4 12.2 11.2*    Recent Labs  11/07/14 1815 11/08/14 0652  WBC 14.1* 9.9  RBC 4.25 3.92  HCT 39.8 36.3  PLT 202 183    Recent Labs  11/06/14 0909 11/07/14 1815 11/08/14 0652  NA 140  --  141  K 3.9  --  4.5  CL 104  --  107  CO2 29  --  23  BUN 20  --  18  CREATININE 1.05 1.10 1.05  GLUCOSE 101*  --  97  CALCIUM 8.9  --  7.8*   No results for input(s): LABPT, INR in the last 72 hours.  Assessment/Plan: Mobilize today - snf next week   DEAN,GREGORY SCOTT 11/09/2014, 7:31 AM

## 2014-11-09 NOTE — Progress Notes (Signed)
Speech Language Pathology Treatment: Dysphagia  Patient Details Name: Laura [name] MRN: [id number] DOB: Sep 26, 1923 Today's Date: 11/09/2014 Time: 5449-2010 SLP Time Calculation (min) (ACUTE ONLY): 13 min  Assessment / Plan / Recommendation Clinical Impression  F/u after yesterday's swallow evaluation.  Pt much more alert; reading the paper, quite confused secondary to dementia, but talkative and laughing.  Swallow function appears to have returned to baseline - pt with excellent oral recognition of POs, normal mastication, no pocketing nor oral holding, swift swallow response and no s/s of aspiration.  Recommend resuming a regular consistency diet, thin liquids; meds whole with puree.  No SLP f/u is warranted - will sign off.    HPI HPI: This 81 year old female was admitted after a fall, which resulted in left hip fracture.  Pt is 1 day post-op surgery.  PMH:  Advanced Alzheimer's Dementia, HTN, COPD.  Pt was living in a Memory Care Unit per friend.     Pertinent Vitals Pain Assessment: No/denies pain  SLP Plan  All goals met    Recommendations Diet recommendations: Regular;Thin liquid Liquids provided via: Cup;Straw Medication Administration: Whole meds with puree Supervision: Intermittent supervision to cue for compensatory strategies Postural Changes and/or Swallow Maneuvers: Seated upright 90 degrees              Oral Care Recommendations: Oral care BID Follow up Recommendations: Skilled Nursing facility Plan: All goals met   Laura [name] L. Tivis Ringer, [place] CCC/SLP Pager [phone]      Laura [name] Laura [name] 11/09/2014, 1:50 PM

## 2014-11-10 DIAGNOSIS — E875 Hyperkalemia: Secondary | ICD-10-CM | POA: Diagnosis not present

## 2014-11-10 LAB — BASIC METABOLIC PANEL
ANION GAP: 7 (ref 5–15)
BUN: 29 mg/dL — ABNORMAL HIGH (ref 6–23)
CO2: 27 mmol/L (ref 19–32)
Calcium: 8 mg/dL — ABNORMAL LOW (ref 8.4–10.5)
Chloride: 105 mmol/L (ref 96–112)
Creatinine, Ser: 1.06 mg/dL (ref 0.50–1.10)
GFR, EST AFRICAN AMERICAN: 53 mL/min — AB (ref >90)
GFR, EST NON AFRICAN AMERICAN: 45 mL/min — AB (ref >90)
Glucose, Bld: 85 mg/dL (ref 70–99)
POTASSIUM: 6 mmol/L — AB (ref 3.5–5.1)
Sodium: 139 mmol/L (ref 135–145)

## 2014-11-10 LAB — URINE CULTURE
COLONY COUNT: NO GROWTH
CULTURE: NO GROWTH

## 2014-11-10 LAB — CBC
HCT: 34.2 % — ABNORMAL LOW (ref 36.0–46.0)
HEMOGLOBIN: 10.6 g/dL — AB (ref 12.0–15.0)
MCH: 28.6 pg (ref 26.0–34.0)
MCHC: 31 g/dL (ref 30.0–36.0)
MCV: 92.4 fL (ref 78.0–100.0)
PLATELETS: 208 10*3/uL (ref 150–400)
RBC: 3.7 MIL/uL — AB (ref 3.87–5.11)
RDW: 14 % (ref 11.5–15.5)
WBC: 8.7 10*3/uL (ref 4.0–10.5)

## 2014-11-10 LAB — POTASSIUM: Potassium: 4 mmol/L (ref 3.5–5.1)

## 2014-11-10 MED ORDER — POLYETHYLENE GLYCOL 3350 17 G PO PACK
17.0000 g | PACK | Freq: Two times a day (BID) | ORAL | Status: DC
Start: 2014-11-10 — End: 2014-11-11
  Administered 2014-11-10 – 2014-11-11 (×3): 17 g via ORAL
  Filled 2014-11-10 (×3): qty 1

## 2014-11-10 MED ORDER — SENNOSIDES-DOCUSATE SODIUM 8.6-50 MG PO TABS
1.0000 | ORAL_TABLET | Freq: Every day | ORAL | Status: DC
  Administered 2014-11-10: 1 via ORAL
  Filled 2014-11-10: qty 1

## 2014-11-10 MED ORDER — SORBITOL 70 % SOLN: 30.0000 mL | Freq: Every day | Status: DC | PRN

## 2014-11-10 NOTE — Progress Notes (Signed)
[facility] PROGRESS NOTE  KEYSI OELKERS OEV:[id number] DOB: [birth year] DOA: 11/05/2014 PCP: Sande Brothers, MD  Assessment/Plan: #1 left femoral neck fracture Secondary to mechanical fall. Patient status post prostatic replacement for femoral neck fracture 11/07/2014. PT/OT. Patient has been seen by physical therapy and recommended skilled nursing facility. Continue pain management. Per orthopedics.  #2 Alzheimer's dementia/??encephalopathy Patient with advanced dementia. Patient resides in a dementia unit. Patient pleasantly confused. Patient seems to be at baseline. Head CT was repeated which was negative for any acute abnormalities. Urinalysis was negative. B-12 levels were 239. RPR was nonreactive. Urinalysis was negative. Follow.   #3 COPD and emphysema Stable.  #4 leukocytosis Likely reactive in nature. Chest x-ray negative. Urinalysis is negative. Leukocytosis trending down. Follow.  #5 thyroid mass Per CT. free T4 within normal limits. Free T3 within normal limits. Thyroid ultrasound with single right sided nodule, which was an incidental finding per CT. Patient asymptomatic. Case discussed with interventional radiology who recommended surveillance with repeat thyroid ultrasound in 6 months.   #6 prophylaxis PPI for GI prohpylaxis. Heparin for DVT prophylaxis.  Code Status: Full Family Communication: Updated patient. No HCPOA at bedside. Disposition Plan: Skilled nursing facility hopefully 1-2 days.   Consultants:  Orthopedics: Dr. [name] 11/05/2014  Procedures:  CT C-spine and head 11/05/2014  X-ray of the knee 11/05/2014  Chest x-ray 11/05/2014  Thyroid ultrasound 11/06/2014  CT head 11/08/14  Antibiotics:  None  HPI/Subjective: Patient denies any pain. No shortness of breath. No chest pain. Pleasantly confused.  Objective: Filed Vitals:   11/10/14 0612  BP: 125/76  Pulse: 106  Temp: 98.9 F (37.2 C)  Resp: 16    Intake/Output Summary (Last  24 hours) at 11/10/14 1419 Last data filed at 11/10/14 1300  Gross per 24 hour  Intake    480 ml  Output      0 ml  Net    480 ml   There were no vitals filed for this visit.  Exam:   General:  NAD  Cardiovascular: RRR  Respiratory: CTAB  Abdomen: Soft/NT/ND/+BS  Musculoskeletal: No c/c/e  Data Reviewed: Basic Metabolic Panel:  Recent Labs Lab 11/05/14 0805 11/06/14 0909 11/07/14 1815 11/08/14 0652 11/09/14 0535 11/10/14 0535 11/10/14 0846  NA 139 140  --  141 145 139  --   K 3.9 3.9  --  4.5 4.1 6.0* 4.0  CL 104 104  --  107 108 105  --   CO2 28 29  --  23 29 27   --   GLUCOSE 106* 101*  --  97 103* 85  --   BUN 25* 20  --  18 26* 29*  --   CREATININE 1.04 1.05 1.10 1.05 1.29* 1.06  --   CALCIUM 8.9 8.9  --  7.8* 8.3* 8.0*  --   MG  --  1.9  --   --   --   --   --    Liver Function Tests:  Recent Labs Lab 11/05/14 0805  AST 22  ALT 16  ALKPHOS 73  BILITOT 0.6  PROT 6.7  ALBUMIN 4.4   No results for input(s): LIPASE, AMYLASE in the last 168 hours. No results for input(s): AMMONIA in the last 168 hours. CBC:  Recent Labs Lab 11/05/14 0805 11/06/14 0909 11/07/14 1815 11/08/14 0652 11/09/14 0535 11/10/14 0535  WBC 16.5* 13.1* 14.1* 9.9 9.4 8.7  NEUTROABS 14.8* 10.7*  --   --   --   --   HGB 14.8 14.4  12.2 11.2* 11.1* 10.6*  HCT 47.2* 45.6 39.8 36.3 36.6 34.2*  MCV 92.5 91.0 93.6 92.6 93.1 92.4  PLT 237 227 202 183 198 208   Cardiac Enzymes: No results for input(s): CKTOTAL, CKMB, CKMBINDEX, TROPONINI in the last 168 hours. BNP (last 3 results)  Recent Labs  10/08/14 0255  BNP 34.1    ProBNP (last 3 results) No results for input(s): PROBNP in the last 8760 hours.  CBG: No results for input(s): GLUCAP in the last 168 hours.  Recent Results (from the past 240 hour(s))  Urine culture     Status: None   Collection Time: 11/05/14  7:36 AM  Result Value Ref Range Status   Specimen Description URINE, CATHETERIZED  Final   Special  Requests NONE  Final   Colony Count NO GROWTH Performed at [facility]   Final   Culture NO GROWTH Performed at [facility]   Final   Report Status 11/06/2014 FINAL  Final  Surgical pcr screen     Status: None   Collection Time: 11/05/14 10:40 PM  Result Value Ref Range Status   MRSA, PCR NEGATIVE NEGATIVE Final   Staphylococcus aureus NEGATIVE NEGATIVE Final    Comment:        The Xpert SA Assay (FDA approved for NASAL specimens in patients over 43 years of age), is one component of a comprehensive surveillance program.  Test performance has been validated by [facility] for patients greater than or equal to 1 year old. It is not intended to diagnose infection nor to guide or monitor treatment.   Culture, Urine     Status: None   Collection Time: 11/08/14 11:39 AM  Result Value Ref Range Status   Specimen Description URINE, CATHETERIZED  Final   Special Requests NONE  Final   Colony Count NO GROWTH Performed at [facility]   Final   Culture NO GROWTH Performed at [facility]   Final   Report Status 11/10/2014 FINAL  Final     Studies: Dg Chest Port 1 View  11/09/2014   CLINICAL DATA:  81 year old female with a history of left hip surgery.  EXAM: PORTABLE CHEST - 1 VIEW  COMPARISON:  11/05/2014, 10/07/2014  FINDINGS: Cardiomediastinal silhouette unchanged in size and contour. Atherosclerotic calcifications of the aortic arch.  Unchanged position of Halter monitor on the left chest wall.  Diffusely coarsened interstitial markings. No confluent airspace disease. No pneumothorax or large pleural effusion.  Partially imaged surgical changes of the left shoulder.  Vertebroplasty of 3 levels again noted.  Surgical changes of prior breast augmentation.  IMPRESSION: No evidence of lobar pneumonia with background of chronic lung changes.  Atherosclerosis.  Signed,  Dulcy Fanny. Earleen Newport, DO  [facility]  [facility]  Radiology   Electronically Signed   By: Corrie Mckusick D.O.   On: 11/09/2014 10:25    Scheduled Meds: . enoxaparin (LOVENOX) injection  40 mg Subcutaneous Q24H  . lactose free nutrition  237 mL Oral BID BM  . meloxicam  7.5 mg Oral Q breakfast  . pantoprazole  40 mg Oral Q0600  . PARoxetine  40 mg Oral Daily  . polyethylene glycol  17 g Oral BID  . senna-docusate  1 tablet Oral QHS  . zolpidem  5 mg Oral QHS   Continuous Infusions:    Principal Problem:   Closed left hip fracture Active Problems:   LBBB (left bundle branch block), rate related   Alzheimer's  dementia   COPD with emphysema   Thyroid mass of unclear etiology: per CT c spine 11/05/14   Left hip pain   Thyroid mass   Acute encephalopathy   Dysphagia   Hyperkalemia    Time spent: 40 minutes    [facility] MD [facility] Pager [phone]. If 7PM-7AM, please contact night-coverage at [email or web address], password [facility] 11/10/2014, 2:19 PM  LOS: 5 days

## 2014-11-10 NOTE — Progress Notes (Signed)
Patient stable Pain controlled Minimal pain with leg range of motion SNF placement pending

## 2014-11-11 DIAGNOSIS — D497 Neoplasm of unspecified behavior of endocrine glands and other parts of nervous system: Secondary | ICD-10-CM | POA: Diagnosis not present

## 2014-11-11 DIAGNOSIS — S72002A Fracture of unspecified part of neck of left femur, initial encounter for closed fracture: Secondary | ICD-10-CM | POA: Diagnosis not present

## 2014-11-11 DIAGNOSIS — R05 Cough: Secondary | ICD-10-CM | POA: Diagnosis not present

## 2014-11-11 DIAGNOSIS — Z96642 Presence of left artificial hip joint: Secondary | ICD-10-CM | POA: Diagnosis not present

## 2014-11-11 DIAGNOSIS — T84041D Periprosthetic fracture around internal prosthetic left hip joint, subsequent encounter: Secondary | ICD-10-CM | POA: Diagnosis not present

## 2014-11-11 DIAGNOSIS — S72002S Fracture of unspecified part of neck of left femur, sequela: Secondary | ICD-10-CM | POA: Diagnosis not present

## 2014-11-11 DIAGNOSIS — M25559 Pain in unspecified hip: Secondary | ICD-10-CM | POA: Diagnosis not present

## 2014-11-11 DIAGNOSIS — M21252 Flexion deformity, left hip: Secondary | ICD-10-CM | POA: Diagnosis not present

## 2014-11-11 DIAGNOSIS — I2510 Atherosclerotic heart disease of native coronary artery without angina pectoris: Secondary | ICD-10-CM | POA: Diagnosis present

## 2014-11-11 DIAGNOSIS — I447 Left bundle-branch block, unspecified: Secondary | ICD-10-CM | POA: Diagnosis present

## 2014-11-11 DIAGNOSIS — G4700 Insomnia, unspecified: Secondary | ICD-10-CM | POA: Diagnosis not present

## 2014-11-11 DIAGNOSIS — K5900 Constipation, unspecified: Secondary | ICD-10-CM | POA: Diagnosis not present

## 2014-11-11 DIAGNOSIS — S72302A Unspecified fracture of shaft of left femur, initial encounter for closed fracture: Secondary | ICD-10-CM | POA: Diagnosis not present

## 2014-11-11 DIAGNOSIS — M6281 Muscle weakness (generalized): Secondary | ICD-10-CM | POA: Diagnosis not present

## 2014-11-11 DIAGNOSIS — F419 Anxiety disorder, unspecified: Secondary | ICD-10-CM | POA: Diagnosis present

## 2014-11-11 DIAGNOSIS — K5901 Slow transit constipation: Secondary | ICD-10-CM | POA: Diagnosis not present

## 2014-11-11 DIAGNOSIS — S72002D Fracture of unspecified part of neck of left femur, subsequent encounter for closed fracture with routine healing: Secondary | ICD-10-CM | POA: Diagnosis not present

## 2014-11-11 DIAGNOSIS — Y92129 Unspecified place in nursing home as the place of occurrence of the external cause: Secondary | ICD-10-CM | POA: Diagnosis not present

## 2014-11-11 DIAGNOSIS — S72112A Displaced fracture of greater trochanter of left femur, initial encounter for closed fracture: Secondary | ICD-10-CM | POA: Diagnosis not present

## 2014-11-11 DIAGNOSIS — Z87891 Personal history of nicotine dependence: Secondary | ICD-10-CM | POA: Diagnosis not present

## 2014-11-11 DIAGNOSIS — Z885 Allergy status to narcotic agent status: Secondary | ICD-10-CM | POA: Diagnosis not present

## 2014-11-11 DIAGNOSIS — F0280 Dementia in other diseases classified elsewhere without behavioral disturbance: Secondary | ICD-10-CM | POA: Diagnosis not present

## 2014-11-11 DIAGNOSIS — W19XXXA Unspecified fall, initial encounter: Secondary | ICD-10-CM | POA: Diagnosis present

## 2014-11-11 DIAGNOSIS — R278 Other lack of coordination: Secondary | ICD-10-CM | POA: Diagnosis not present

## 2014-11-11 DIAGNOSIS — J449 Chronic obstructive pulmonary disease, unspecified: Secondary | ICD-10-CM | POA: Diagnosis present

## 2014-11-11 DIAGNOSIS — R296 Repeated falls: Secondary | ICD-10-CM | POA: Diagnosis present

## 2014-11-11 DIAGNOSIS — R0902 Hypoxemia: Secondary | ICD-10-CM | POA: Diagnosis not present

## 2014-11-11 DIAGNOSIS — D72829 Elevated white blood cell count, unspecified: Secondary | ICD-10-CM | POA: Diagnosis present

## 2014-11-11 DIAGNOSIS — N179 Acute kidney failure, unspecified: Secondary | ICD-10-CM | POA: Diagnosis not present

## 2014-11-11 DIAGNOSIS — Z79899 Other long term (current) drug therapy: Secondary | ICD-10-CM | POA: Diagnosis not present

## 2014-11-11 DIAGNOSIS — Z950 Presence of cardiac pacemaker: Secondary | ICD-10-CM | POA: Diagnosis not present

## 2014-11-11 DIAGNOSIS — R2681 Unsteadiness on feet: Secondary | ICD-10-CM | POA: Diagnosis not present

## 2014-11-11 DIAGNOSIS — D649 Anemia, unspecified: Secondary | ICD-10-CM | POA: Diagnosis not present

## 2014-11-11 DIAGNOSIS — N390 Urinary tract infection, site not specified: Secondary | ICD-10-CM | POA: Diagnosis not present

## 2014-11-11 DIAGNOSIS — D62 Acute posthemorrhagic anemia: Secondary | ICD-10-CM | POA: Diagnosis not present

## 2014-11-11 DIAGNOSIS — G309 Alzheimer's disease, unspecified: Secondary | ICD-10-CM | POA: Diagnosis not present

## 2014-11-11 DIAGNOSIS — E875 Hyperkalemia: Secondary | ICD-10-CM | POA: Diagnosis not present

## 2014-11-11 DIAGNOSIS — J439 Emphysema, unspecified: Secondary | ICD-10-CM | POA: Diagnosis not present

## 2014-11-11 DIAGNOSIS — E079 Disorder of thyroid, unspecified: Secondary | ICD-10-CM | POA: Diagnosis not present

## 2014-11-11 DIAGNOSIS — I10 Essential (primary) hypertension: Secondary | ICD-10-CM | POA: Diagnosis present

## 2014-11-11 DIAGNOSIS — Z471 Aftercare following joint replacement surgery: Secondary | ICD-10-CM | POA: Diagnosis not present

## 2014-11-11 DIAGNOSIS — Z9981 Dependence on supplemental oxygen: Secondary | ICD-10-CM | POA: Diagnosis not present

## 2014-11-11 DIAGNOSIS — G9340 Encephalopathy, unspecified: Secondary | ICD-10-CM | POA: Diagnosis not present

## 2014-11-11 DIAGNOSIS — T84041A Periprosthetic fracture around internal prosthetic left hip joint, initial encounter: Secondary | ICD-10-CM | POA: Diagnosis not present

## 2014-11-11 DIAGNOSIS — M1990 Unspecified osteoarthritis, unspecified site: Secondary | ICD-10-CM | POA: Diagnosis not present

## 2014-11-11 DIAGNOSIS — F329 Major depressive disorder, single episode, unspecified: Secondary | ICD-10-CM | POA: Diagnosis not present

## 2014-11-11 LAB — BASIC METABOLIC PANEL
Anion gap: 8 (ref 5–15)
BUN: 23 mg/dL (ref 6–23)
CALCIUM: 8.3 mg/dL — AB (ref 8.4–10.5)
CHLORIDE: 101 mmol/L (ref 96–112)
CO2: 30 mmol/L (ref 19–32)
Creatinine, Ser: 1.13 mg/dL — ABNORMAL HIGH (ref 0.50–1.10)
GFR calc non Af Amer: 42 mL/min — ABNORMAL LOW (ref >90)
GFR, EST AFRICAN AMERICAN: 49 mL/min — AB (ref >90)
GLUCOSE: 97 mg/dL (ref 70–99)
Potassium: 3.7 mmol/L (ref 3.5–5.1)
Sodium: 139 mmol/L (ref 135–145)

## 2014-11-11 LAB — FOLATE RBC
FOLATE, RBC: 1160 ng/mL (ref >498)
Folate, Hemolysate: 378.3 ng/mL
Hematocrit: 32.6 % — ABNORMAL LOW (ref 34.0–46.6)

## 2014-11-11 MED ORDER — BOOST PLUS PO LIQD: 237.0000 mL | Freq: Two times a day (BID) | ORAL | Status: DC

## 2014-11-11 MED ORDER — SENNOSIDES-DOCUSATE SODIUM 8.6-50 MG PO TABS: 1.0000 | ORAL_TABLET | Freq: Every day | ORAL | Status: DC

## 2014-11-11 NOTE — Clinical Social Work Placement (Signed)
   CLINICAL SOCIAL WORK PLACEMENT  NOTE  Date:  11/11/2014  Patient Details  Name: Laura [name] MRN: [id number] Date of Birth: 15-Apr-1924  Clinical Social Work is seeking post-discharge placement for this patient at the [place] level of care (*CSW will initial, date and re-position this form in  chart as items are completed):  Yes   Patient/family provided with [place] Work Department's list of facilities offering this level of care within the geographic area requested by the patient (or if unable, by the patient's family).  Yes   Patient/family informed of their freedom to choose among providers that offer the needed level of care, that participate in Medicare, Medicaid or managed care program needed by the patient, have an available bed and are willing to accept the patient.  Yes   Patient/family informed of [facility]'s ownership interest in [facility] and [facility], as well as of the fact that they are under no obligation to receive care at these facilities.  PASRR submitted to EDS on       PASRR number received on       Existing PASRR number confirmed on 11/09/14     FL2 transmitted to all facilities in geographic area requested by pt/family on 11/11/14     FL2 transmitted to all facilities within larger geographic area on       Patient informed that his/her managed care company has contracts with or will negotiate with certain facilities, including the following:        Yes   Patient/family informed of bed offers received.  Patient chooses bed at [facility]     Physician recommends and patient chooses bed at      Patient to be transferred to [facility] on 11/11/14.  Patient to be transferred to facility by [facility]     Patient family notified on 11/11/14 of transfer.  Name of family member notified:  Josiah Lobo     PHYSICIAN       Additional Comment:     _______________________________________________ Caroline Sauger, LCSW 11/11/2014, 12:09 PM  [phone]

## 2014-11-11 NOTE — Discharge Planning (Signed)
Patient to be discharged to [facility]. Patient's HCPOA, Josiah Lobo, updated at bedside.  Facility: [facility] Report number: [phone] Transport: EMS ([place])  Lubertha Sayres, [place] Cell: [phone]       Fax: [phone] Clinical Social Work: Orthopedics [phone]) and Surgical [phone])

## 2014-11-11 NOTE — Progress Notes (Signed)
Rn called report to Agricultural consultant at [facility]. Patient will be discharged and transported via [facility].

## 2014-11-11 NOTE — Progress Notes (Signed)
Physical Therapy Treatment Patient Details Name: Laura [name] MRN: [id number] DOB: March 14, 1924 Today's Date: 11/11/2014    History of Present Illness 81 y.o. female with significant history of dementia had a fall resulting in Lt hip fracture. s/p Lt hip Hemiarthroplasty (Anterior Approach)    PT Comments    Patient able to progress well this session and ambulated in room. Patient pleasantly confused and required refocusing on task. Continue to recommend SNF for ongoing Physical Therapy.     Follow Up Recommendations  SNF;Supervision/Assistance - 24 hour     Equipment Recommendations  None recommended by PT    Recommendations for Other Services       Precautions / Restrictions Precautions Precautions: Fall Restrictions Weight Bearing Restrictions: Yes LLE Weight Bearing: Weight bearing as tolerated    Mobility  Bed Mobility Overal bed mobility: Needs Assistance Bed Mobility: Supine to Sit     Supine to sit: Mod assist     General bed mobility comments: A for LEs and for truncal support to EOB. Cues and A to correct posterior lean in sitting.   Transfers Overall transfer level: Needs assistance Equipment used: Rolling walker (2 wheeled) Transfers: Sit to/from Stand Sit to Stand: Mod assist         General transfer comment: A to power up into standing and cues for safe hand placement. Patient stood x1 from bed and x1 from recliner.   Ambulation/Gait Ambulation/Gait assistance: Min assist;+2 physical assistance;+2 safety/equipment Ambulation Distance (Feet): 15 Feet Assistive device: Rolling walker (2 wheeled) Gait Pattern/deviations: Step-to pattern;Decreased step length - left   Gait velocity interpretation: <1.8 ft/sec, indicative of risk for recurrent falls General Gait Details: Patient able to ambulate with +2 MIn A to ensure balance and safe management of RW. Seated rest break after 26ft then able to walk 84ft more prior to complaining of pain. Limited  movement of LLE with ambulation.    Stairs            Wheelchair Mobility    Modified Rankin (Stroke Patients Only)       Balance   Sitting-balance support: Bilateral upper extremity supported;Feet supported Sitting balance-Leahy Scale: Poor Sitting balance - Comments: Sat EOB with UE support and Minguard A                            Cognition Arousal/Alertness: Awake/alert Behavior During Therapy: WFL for tasks assessed/performed Overall Cognitive Status: History of cognitive impairments - at baseline       Memory: Decreased short-term memory              Exercises      General Comments        Pertinent Vitals/Pain Faces Pain Scale: Hurts a little bit Pain Descriptors / Indicators: Sore Pain Intervention(s): Repositioned    Home Living                      Prior Function            PT Goals (current goals can now be found in the care plan section) Progress towards PT goals: Progressing toward goals    Frequency  Min 3X/week    PT Plan Current plan remains appropriate    Co-evaluation             End of Session Equipment Utilized During Treatment: Gait belt Activity Tolerance: Patient tolerated treatment well Patient left: in chair;with call bell/phone within reach;with chair alarm  set     Time: 5573-2202 PT Time Calculation (min) (ACUTE ONLY): 24 min  Charges:  $Gait Training: 8-22 mins $Therapeutic Activity: 8-22 mins                    G Codes:      Jacqualyn Posey 11/11/2014, 9:26 AM 11/11/2014 Jacqualyn Posey PTA [phone] pager [phone] office

## 2014-11-11 NOTE — Discharge Summary (Signed)
Physician Discharge Summary  Laura [name] NKN:[id number] DOB: [birth year] DOA: 11/05/2014  PCP: Sande Brothers, MD  Admit date: 11/05/2014 Discharge date: 11/11/2014  Time spent: 65 minutes  Recommendations for Outpatient Follow-up:  1. Follow-up with Dr. [name] of orthopedics in 2 weeks. 2. Follow-up with M.D. at skilled nursing facility. Patient will needed repeat thyroid ultrasound to follow-up on thyroid nodule for surveillance in 6 months. Patient also need a basic metabolic profile done in 1 week to follow-up on electrolytes and renal function.  Discharge Diagnoses:  Principal Problem:   Closed left hip fracture Active Problems:   LBBB (left bundle branch block), rate related   Alzheimer's dementia   COPD with emphysema   Thyroid mass of unclear etiology: per CT c spine 11/05/14   Left hip pain   Thyroid mass   Acute encephalopathy   Dysphagia   Hyperkalemia   Discharge Condition: Stable and improved  Diet recommendation: Regular  There were no vitals filed for this visit.  History of present illness:  Per Dr Laura [name] is a 81 y.o. female with past medical history of advanced dementia, HTN and arthritis. Patient lives in a dementia unit, brought after she was found on the floor. Apparently patient had an unwitnessed fall was unable to get up after that, was found on the floor by the dementia unit staff and brought to the hospital for further evaluation. In the ED x-ray of the left hip showed left subcapital close fracture, CT of the head and neck showed no acute findings. She had WBCs of 16.5. Patient was admitted for further evaluation.  Hospital Course:  #1 left femoral neck fracture Secondary to mechanical fall. Patient was admitted and orthopedics was consulted. Patient subsequently underwent prosthetic replacement for femoral neck fracture 11/07/2014 per Dr [name]. Patient has been seen by physical therapy and recommended skilled nursing facility. Patient's  pain was managed and patient be discharged in stable condition. Patient will follow-up with orthopedics as outpatient in 2 weeks.   #2 Alzheimer's dementia/??encephalopathy Patient with advanced dementia. Patient resided in a dementia unit prior to admission. Patient pleasantly confused during the hospitalization without any behavioral disturbance. Patient seemed to be at baseline. Head CT was repeated which was negative for any acute abnormalities. Urinalysis was negative. B-12 levels were 239. RPR was nonreactive. Patient remained stable and is at baseline by day of discharge.  #3 COPD and emphysema Remained stable throughout the hospitalization.   #4 leukocytosis Likely reactive in nature. Chest x-ray negative. Urinalysis is negative. Leukocytosis trending down and had resolved by day of discharge.   #5 thyroid mass incidentally noted on CT Per CT. free T4 within normal limits. Free T3 within normal limits. Thyroid ultrasound with single right sided nodule, which was an incidental finding per CT. Patient asymptomatic. Case was discussed with interventional radiology who recommended surveillance with repeat thyroid ultrasound in 6 months.    Procedures:  CT C-spine and head 11/05/2014  X-ray of the knee 11/05/2014  Chest x-ray 11/05/2014  Thyroid ultrasound 11/06/2014  CT head 11/08/14  Consultations:  Orthopedics: Dr. [name] 11/05/2014  Discharge Exam: Filed Vitals:   11/11/14 0447  BP: 131/66  Pulse: 114  Temp: 98.6 F (37 C)  Resp: 18    General: NAD Cardiovascular: RRR Respiratory: CTAB  Discharge Instructions   Discharge Instructions    Diet general    Complete by:  As directed      Discharge instructions    Complete by:  As directed  Follow up with Dr [name] in 2 weeks. Will need repeat thyroid US in 6 months for surveillance of thyroid nodule.     Increase activity slowly    Complete by:  As directed      Weight bearing as tolerated    Complete by:  As  directed           Current Discharge Medication List    START taking these medications   Details  enoxaparin (LOVENOX) 40 MG/0.4ML injection Inject 0.4 mLs (40 mg total) into the skin daily. Qty: 14 Syringe, Refills: 0    oxyCODONE (OXY IR/ROXICODONE) 5 MG immediate release tablet Take 1-3 tablets (5-15 mg total) by mouth every 4 (four) hours as needed. Qty: 90 tablet, Refills: 0    senna-docusate (SENOKOT-S) 8.6-50 MG per tablet Take 1 tablet by mouth at bedtime.      CONTINUE these medications which have CHANGED   Details  lactose free nutrition (BOOST PLUS) LIQD Take 237 mLs by mouth 2 (two) times daily between meals. Refills: 0      CONTINUE these medications which have NOT CHANGED   Details  acetaminophen (MAPAP) 500 MG tablet Take 500 mg by mouth every 6 (six) hours as needed for mild pain, fever or headache.     albuterol (PROVENTIL HFA;VENTOLIN HFA) 108 (90 BASE) MCG/ACT inhaler Inhale 2 puffs into the lungs every 6 (six) hours as needed for wheezing or shortness of breath. Qty: 1 Inhaler, Refills: 2    alum & mag hydroxide-simeth (MI-ACID MAXIMUM STRENGTH) 400-400-40 MG/5ML suspension Take 30 mLs by mouth every 6 (six) hours as needed for indigestion.     Carbomer Gel Base (HYDROGEL) GEL Apply 1 application topically daily as needed (for rash). Apply to face    cholecalciferol (VITAMIN D) 1000 UNITS tablet Take 1,000 Units by mouth daily with breakfast.     guaifenesin (Q-TUSSIN) 100 MG/5ML syrup Take 200 mg by mouth every 6 (six) hours as needed for cough.     loperamide (IMODIUM) 2 MG capsule Take 2 mg by mouth as needed for diarrhea or loose stools. NOT TO EXCEED 8 DOSES IN 24 HR    magnesium hydroxide (MILK OF MAGNESIA) 400 MG/5ML suspension Take 30 mLs by mouth daily as needed for mild constipation.     meloxicam (MOBIC) 7.5 MG tablet Take 7.5 mg by mouth daily with breakfast.    Multiple Vitamins-Minerals (MULTIVITAMIN) tablet Take 1 tablet by mouth daily.     Naphazoline-Glycerin-Zinc Sulf (CLEAR EYES MAXIMUM ITCHY EYE OP) Place 1-2 drops into both eyes 3 (three) times daily as needed (for itching and redness). Wait 3 to 5 minutes between each drop    Neomycin-Bacitracin-Polymyxin (HCA TRIPLE ANTIBIOTIC OINTMENT EX) Apply 1 application topically as needed (for wound care).     PARoxetine (PAXIL) 40 MG tablet Take 40 mg by mouth every morning.    polyethylene glycol (MIRALAX / GLYCOLAX) packet Take 17 g by mouth daily as needed for mild constipation.     temazepam (RESTORIL) 7.5 MG capsule Take 1 capsule (7.5 mg total) by mouth at bedtime. Qty: 20 capsule, Refills: 0      STOP taking these medications     HYDROcodone-acetaminophen (NORCO/VICODIN) 5-325 MG per tablet        Allergies  Allergen Reactions  . Codeine     Makes her ill   Follow-up Information    Follow up with Marianna Payment, MD In 2 weeks.   Specialty:  Orthopedic Surgery   Why:  For suture removal, For wound re-check   Contact information:   [address] [place] [address] [phone]       Please follow up.   Why:  f/u with MD at SNF       The results of significant diagnostics from this hospitalization (including imaging, microbiology, ancillary and laboratory) are listed below for reference.    Significant Diagnostic Studies: Dg Chest 2 View  11/05/2014   CLINICAL DATA:  [place] at nursing home, shortness of breath, preoperative assessment  EXAM: CHEST  2 VIEW  COMPARISON:  10/07/2014  FINDINGS: Loop recorder projects over LEFT chest.  Enlargement of cardiac silhouette with pulmonary vascular congestion.  Atherosclerotic calcification aorta.  Emphysematous and bronchitic changes consistent with COPD.  Chronic accentuation of perihilar markings unchanged.  No gross infiltrate, pleural effusion or pneumothorax.  Calcified BILATERAL breast prostheses.  Marked osseous demineralization with multiple prior spinal augmentation procedures.  LEFT  shoulder prosthesis.  IMPRESSION: COPD changes.  No acute abnormalities.   Electronically Signed   By: Lavonia Dana M.D.   On: 11/05/2014 08:48   Dg Knee 2 Views Left  11/05/2014   CLINICAL DATA:  Left leg foreshortened.  Left knee pain.  Fall.  EXAM: LEFT KNEE - 1-2 VIEW  COMPARISON:  None.  FINDINGS: Degenerative changes in the left knee with joint space narrowing, spurring, most pronounced in the lateral compartment. No fracture, subluxation or dislocation. Diffuse osteopenia. Soft tissues are intact and unremarkable.  IMPRESSION: Tricompartment degenerative changes.  No acute bony abnormality.   Electronically Signed   By: Rolm Baptise M.D.   On: 11/05/2014 08:49   Ct Head Wo Contrast  11/08/2014   CLINICAL DATA:  Dementia  EXAM: CT HEAD WITHOUT CONTRAST  TECHNIQUE: Contiguous axial images were obtained from the base of the skull through the vertex without intravenous contrast.  COMPARISON:  4/19/ 16  FINDINGS: No skull fracture is noted. Paranasal sinuses and mastoid air cells are unremarkable. Stable atrophy. Stable periventricular and patchy subcortical chronic white matter disease. Atherosclerotic calcifications of carotid siphon again noted. No acute cortical infarction. No mass lesion is noted on this unenhanced scan. The gray and white-matter differentiation is preserved.  IMPRESSION: No acute intracranial abnormality. Stable atrophy and chronic white matter disease.   Electronically Signed   By: Lahoma Crocker M.D.   On: 11/08/2014 14:06   Ct Head Wo Contrast  11/05/2014   CLINICAL DATA:  Fall, found on floor covered in urine at 0615 hours today dementia, LEFT hip pain, hypertension, Alzheimer's  EXAM: CT HEAD WITHOUT CONTRAST  CT CERVICAL SPINE WITHOUT CONTRAST  TECHNIQUE: Multidetector CT imaging of the head and cervical spine was performed following the standard protocol without intravenous contrast. Multiplanar CT image reconstructions of the cervical spine were also generated.  COMPARISON:   10/07/2014  FINDINGS: CT HEAD FINDINGS  Generalized atrophy.  Normal ventricular morphology.  No midline shift or mass effect.  Small vessel chronic ischemic changes of deep cerebral white matter.  No intracranial hemorrhage, mass lesion, or acute infarction.  Visualized paranasal sinuses and mastoid air cells clear.  Bones unremarkable.  CT CERVICAL SPINE FINDINGS  Beam hardening artifacts of dental origin and from LEFT shoulder prosthesis.  RIGHT thyroid mass 4.1 x 4.1 x 2.5 cm image 63.  Visualized skullbase intact.  Diffuse osseous demineralization.  Prevertebral soft tissues normal thickness.  Disc space narrowing with endplate spur formation at C5-C6.  Multilevel facet degenerative changes.  Vertebral body heights maintained without fracture or  subluxation.  No bone destruction.  Lung apices clear.  IMPRESSION: Atrophy with small vessel chronic ischemic changes of deep cerebral white matter.  No acute intracranial abnormalities.  Degenerative disc and facet disease changes cervical spine.  No acute cervical spine abnormalities.  RIGHT thyroid mass 4.1 x 4.1 x 2.5 cm; dedicated thyroid ultrasound followup recommended.   Electronically Signed   By: Lavonia Dana M.D.   On: 11/05/2014 10:46   Ct Cervical Spine Wo Contrast  11/05/2014   CLINICAL DATA:  Fall, found on floor covered in urine at 0615 hours today dementia, LEFT hip pain, hypertension, Alzheimer's  EXAM: CT HEAD WITHOUT CONTRAST  CT CERVICAL SPINE WITHOUT CONTRAST  TECHNIQUE: Multidetector CT imaging of the head and cervical spine was performed following the standard protocol without intravenous contrast. Multiplanar CT image reconstructions of the cervical spine were also generated.  COMPARISON:  10/07/2014  FINDINGS: CT HEAD FINDINGS  Generalized atrophy.  Normal ventricular morphology.  No midline shift or mass effect.  Small vessel chronic ischemic changes of deep cerebral white matter.  No intracranial hemorrhage, mass lesion, or acute  infarction.  Visualized paranasal sinuses and mastoid air cells clear.  Bones unremarkable.  CT CERVICAL SPINE FINDINGS  Beam hardening artifacts of dental origin and from LEFT shoulder prosthesis.  RIGHT thyroid mass 4.1 x 4.1 x 2.5 cm image 63.  Visualized skullbase intact.  Diffuse osseous demineralization.  Prevertebral soft tissues normal thickness.  Disc space narrowing with endplate spur formation at C5-C6.  Multilevel facet degenerative changes.  Vertebral body heights maintained without fracture or subluxation.  No bone destruction.  Lung apices clear.  IMPRESSION: Atrophy with small vessel chronic ischemic changes of deep cerebral white matter.  No acute intracranial abnormalities.  Degenerative disc and facet disease changes cervical spine.  No acute cervical spine abnormalities.  RIGHT thyroid mass 4.1 x 4.1 x 2.5 cm; dedicated thyroid ultrasound followup recommended.   Electronically Signed   By: Lavonia Dana M.D.   On: 11/05/2014 10:46   US Soft Tissue Head/neck  11/07/2014   CLINICAL DATA:  81 year old female with a history of thyroid mass.  EXAM: THYROID ULTRASOUND  TECHNIQUE: Ultrasound examination of the thyroid gland and adjacent soft tissues was performed.  COMPARISON:  None.  FINDINGS: Right thyroid lobe  Measurements: 5.7 cm x 3.5 cm x 3.0 cm. Solid right thyroid nodule measures 4.0 cm x 2.6 cm x 2.6 cm  Left thyroid lobe  Measurements: 3.5 cm x 1.4 cm x 1.4 cm.  No nodules visualized.  Isthmus  Thickness: 4 mm.  No nodules visualized.  Lymphadenopathy  None visualized.  IMPRESSION: Single right-sided nodule which meets criteria for biopsy.  Ultrasound-guided fine needle aspiration should be considered, as per the consensus statement: Management of Thyroid Nodules Detected at [place]: Society of Radiologists in [place]. Radiology 2005; [id number].  Signed,  Dulcy Fanny. Earleen Newport, DO  [facility]  [name]    Electronically Signed   By: Corrie Mckusick D.O.   On: 11/07/2014 08:17   Pelvis Portable  11/07/2014   CLINICAL DATA:  Status post left hip replacement  EXAM: PORTABLE PELVIS 1-2 VIEWS  COMPARISON:  None.  FINDINGS: A left hip prosthesis is noted. No acute abnormality is noted. The pelvic ring is intact. A calcified uterine fibroid is seen.  IMPRESSION: Status post left hip replacement, no acute abnormality noted.   Electronically Signed   By: Inez Catalina M.D.   On: 11/07/2014 16:26  Dg Chest Port 1 View  11/09/2014   CLINICAL DATA:  81 year old female with a history of left hip surgery.  EXAM: PORTABLE CHEST - 1 VIEW  COMPARISON:  11/05/2014, 10/07/2014  FINDINGS: Cardiomediastinal silhouette unchanged in size and contour. Atherosclerotic calcifications of the aortic arch.  Unchanged position of Halter monitor on the left chest wall.  Diffusely coarsened interstitial markings. No confluent airspace disease. No pneumothorax or large pleural effusion.  Partially imaged surgical changes of the left shoulder.  Vertebroplasty of 3 levels again noted.  Surgical changes of prior breast augmentation.  IMPRESSION: No evidence of lobar pneumonia with background of chronic lung changes.  Atherosclerosis.  Signed,  Dulcy Fanny. Earleen Newport, DO  [facility]  [facility]   Electronically Signed   By: Corrie Mckusick D.O.   On: 11/09/2014 10:25   Dg Hip Operative Unilat With Pelvis Left  11/07/2014   CLINICAL DATA:  Subcapital femoral neck fracture  EXAM: OPERATIVE LEFT HIP WITH PELVIS  COMPARISON:  None.  FLUOROSCOPY TIME:  Radiation Exposure Index (as provided by the fluoroscopic device): Not available  If the device does not provide the exposure index:  Fluoroscopy Time:  32 seconds  Number of Acquired Images:  Or  FINDINGS: The left subcapital femoral neck fracture is again noted left hip replacement was then performed. No acute abnormality is noted.  IMPRESSION: Status post left  hip replacement   Electronically Signed   By: Inez Catalina M.D.   On: 11/07/2014 14:15   Dg Hip Unilat With Pelvis 2-3 Views Left  11/05/2014   CLINICAL DATA:  Status post fall at nursing home. Clinical shortening of the left leg with lateral rotation, severe pain  EXAM: LEFT HIP (WITH PELVIS) 2-3 VIEWS  COMPARISON:  None.  FINDINGS: The patient has sustained complete fracture through the subcapital region of the left hip. There is mild superior migration of the femur with respect to the femoral head. The bony pelvis is intact. There is a calcified fibroid demonstrated.  IMPRESSION: The patient has sustained an acute subcapital fracture of the left hip with superior migration of the femur with respect to the femoral head.   Electronically Signed   By: David  Martinique   On: 11/05/2014 08:48    Microbiology: Recent Results (from the past 240 hour(s))  Urine culture     Status: None   Collection Time: 11/05/14  7:36 AM  Result Value Ref Range Status   Specimen Description URINE, CATHETERIZED  Final   Special Requests NONE  Final   Colony Count NO GROWTH Performed at [facility]   Final   Culture NO GROWTH Performed at [facility]   Final   Report Status 11/06/2014 FINAL  Final  Surgical pcr screen     Status: None   Collection Time: 11/05/14 10:40 PM  Result Value Ref Range Status   MRSA, PCR NEGATIVE NEGATIVE Final   Staphylococcus aureus NEGATIVE NEGATIVE Final    Comment:        The Xpert SA Assay (FDA approved for NASAL specimens in patients over 44 years of age), is one component of a comprehensive surveillance program.  Test performance has been validated by [facility] for patients greater than or equal to 34 year old. It is not intended to diagnose infection nor to guide or monitor treatment.   Culture, Urine     Status: None   Collection Time: 11/08/14 11:39 AM  Result Value Ref Range Status  Specimen Description URINE, CATHETERIZED  Final   Special  Requests NONE  Final   Colony Count NO GROWTH Performed at [facility]   Final   Culture NO GROWTH Performed at [facility]   Final   Report Status 11/10/2014 FINAL  Final     Labs: Basic Metabolic Panel:  Recent Labs Lab 11/06/14 0909 11/07/14 1815 11/08/14 6811 11/09/14 0535 11/10/14 0535 11/10/14 0846 11/11/14 0512  NA 140  --  141 145 139  --  139  K 3.9  --  4.5 4.1 6.0* 4.0 3.7  CL 104  --  107 108 105  --  101  CO2 29  --  23 29 27   --  30  GLUCOSE 101*  --  97 103* 85  --  97  BUN 20  --  18 26* 29*  --  23  CREATININE 1.05 1.10 1.05 1.29* 1.06  --  1.13*  CALCIUM 8.9  --  7.8* 8.3* 8.0*  --  8.3*  MG 1.9  --   --   --   --   --   --    Liver Function Tests:  Recent Labs Lab 11/05/14 0805  AST 22  ALT 16  ALKPHOS 73  BILITOT 0.6  PROT 6.7  ALBUMIN 4.4   No results for input(s): LIPASE, AMYLASE in the last 168 hours. No results for input(s): AMMONIA in the last 168 hours. CBC:  Recent Labs Lab 11/05/14 0805 11/06/14 0909 11/07/14 1815 11/08/14 0652 11/09/14 0535 11/10/14 0535  WBC 16.5* 13.1* 14.1* 9.9 9.4 8.7  NEUTROABS 14.8* 10.7*  --   --   --   --   HGB 14.8 14.4 12.2 11.2* 11.1* 10.6*  HCT 47.2* 45.6 39.8 36.3 36.6 34.2*  MCV 92.5 91.0 93.6 92.6 93.1 92.4  PLT 237 227 202 183 198 208   Cardiac Enzymes: No results for input(s): CKTOTAL, CKMB, CKMBINDEX, TROPONINI in the last 168 hours. BNP: BNP (last 3 results)  Recent Labs  10/08/14 0255  BNP 34.1    ProBNP (last 3 results) No results for input(s): PROBNP in the last 8760 hours.  CBG: No results for input(s): GLUCAP in the last 168 hours.     SignedIrine Seal MD [facility] 11/11/2014, 10:42 AM

## 2014-11-13 ENCOUNTER — Encounter: Payer: Self-pay | Admitting: Adult Health

## 2014-11-13 ENCOUNTER — Non-Acute Institutional Stay (SKILLED_NURSING_FACILITY): Payer: Medicare Other | Admitting: Adult Health

## 2014-11-13 DIAGNOSIS — F0280 Dementia in other diseases classified elsewhere without behavioral disturbance: Secondary | ICD-10-CM

## 2014-11-13 DIAGNOSIS — G4700 Insomnia, unspecified: Secondary | ICD-10-CM

## 2014-11-13 DIAGNOSIS — F329 Major depressive disorder, single episode, unspecified: Secondary | ICD-10-CM | POA: Diagnosis not present

## 2014-11-13 DIAGNOSIS — E0789 Other specified disorders of thyroid: Secondary | ICD-10-CM

## 2014-11-13 DIAGNOSIS — F32A Depression, unspecified: Secondary | ICD-10-CM

## 2014-11-13 DIAGNOSIS — G309 Alzheimer's disease, unspecified: Secondary | ICD-10-CM | POA: Diagnosis not present

## 2014-11-13 DIAGNOSIS — S72002S Fracture of unspecified part of neck of left femur, sequela: Secondary | ICD-10-CM | POA: Diagnosis not present

## 2014-11-13 DIAGNOSIS — D497 Neoplasm of unspecified behavior of endocrine glands and other parts of nervous system: Secondary | ICD-10-CM | POA: Diagnosis not present

## 2014-11-13 DIAGNOSIS — J439 Emphysema, unspecified: Secondary | ICD-10-CM | POA: Diagnosis not present

## 2014-11-13 DIAGNOSIS — K5900 Constipation, unspecified: Secondary | ICD-10-CM

## 2014-11-13 NOTE — Progress Notes (Signed)
Patient ID: Laura [name], female   DOB: April 21, 1924, 81 y.o.   MRN: [id number]   11/13/2014  Facility:  Nursing Home Location:  [place] Room Number: 1007-P LEVEL OF CARE:  SNF (31)   Chief Complaint  Patient presents with  . Hospitalization Follow-up    Left hip fracture S/P left hip anterior hemiarthroplasty, Alzheimer's dementia, COPD, thyroid mass, constipation, depression and insomnia    HISTORY OF PRESENT ILLNESS:  This is an 81 year old female who has been admitted to [facility] on 11/11/14 from [facility]. She has PMH of advanced dementia, hypertension and arthritis. She is a resident of a dementia unit. She was found on the floor and had an apparent unwitnessed fall. She was unable to get up after the fall. She sustained a left femoral neck fracture and had a recent left hip anterior hemiarthroplasty.  She is seen in her room with a friend at bedside. She is pleasantly confused and does not complain of pain.  She has been admitted for a short-term rehabilitation.  PAST MEDICAL HISTORY:  Past Medical History  Diagnosis Date  . Hypertension   . Arthritis   . Alzheimer's dementia   . Anxiety   . Depression     CURRENT MEDICATIONS: Reviewed per MAR/see medication list  Allergies  Allergen Reactions  . Codeine     Makes her ill     REVIEW OF SYSTEMS: Unable to obtain due to advanced dementia  PHYSICAL EXAMINATION  GENERAL: no acute distress, normal body habitus SKIN:  Left hip, anterior, has staples, no redness EYES: conjunctivae normal, sclerae normal, normal eye lids NECK: supple, trachea midline, no neck masses, no thyroid tenderness, no thyromegaly LYMPHATICS: no LAN in the neck, no supraclavicular LAN RESPIRATORY: breathing is even & unlabored, BS CTAB CARDIAC: RRR, no murmur,no extra heart sounds, no edema GI: abdomen soft, normal BS, no masses, no tenderness, no hepatomegaly, no splenomegaly EXTREMITIES:  Able to move 4 extremities with limited range of motion on LLE PSYCHIATRIC: the patient is alert & oriented to person, affect & behavior appropriate  LABS/RADIOLOGY: Labs reviewed: Basic Metabolic Panel:  Recent Labs  11/06/14 0909  11/09/14 0535 11/10/14 0535 11/10/14 0846 11/11/14 0512  NA 140  < > 145 139  --  139  K 3.9  < > 4.1 6.0* 4.0 3.7  CL 104  < > 108 105  --  101  CO2 29  < > 29 27  --  30  GLUCOSE 101*  < > 103* 85  --  97  BUN 20  < > 26* 29*  --  23  CREATININE 1.05  < > 1.29* 1.06  --  1.13*  CALCIUM 8.9  < > 8.3* 8.0*  --  8.3*  MG 1.9  --   --   --   --   --   < > = values in this interval not displayed. Liver Function Tests:  Recent Labs  10/22/14 1532 10/23/14 0543 11/05/14 0805  AST 20 21 22   ALT 20 18 16   ALKPHOS 59 53 73  BILITOT 0.5 0.4 0.6  PROT 6.1 5.6* 6.7  ALBUMIN 3.9 3.5 4.4   CBC:  Recent Labs  10/08/14 0458  11/05/14 0805 11/06/14 0909  11/08/14 0652 11/08/14 1308 11/09/14 0535 11/10/14 0535  WBC 8.7  < > 16.5* 13.1*  < > 9.9  --  9.4 8.7  NEUTROABS 6.2  --  14.8* 10.7*  --   --   --   --   --  HGB 14.3  < > 14.8 14.4  < > 11.2*  --  11.1* 10.6*  HCT 46.8*  < > 47.2* 45.6  < > 36.3 32.6* 36.6 34.2*  MCV 93.4  < > 92.5 91.0  < > 92.6  --  93.1 92.4  PLT 212  < > 237 227  < > 183  --  198 208  < > = values in this interval not displayed.  Cardiac Enzymes:  Recent Labs  10/08/14 0255 10/08/14 1000 10/08/14 1448  TROPONINI <0.03 <0.03 <0.03   CBG:  Recent Labs  10/23/14 0720 10/24/14 0734 10/25/14 0731  GLUCAP 88 84 83     Dg Chest 2 View  11/05/2014   CLINICAL DATA:  [place] at nursing home, shortness of breath, preoperative assessment  EXAM: CHEST  2 VIEW  COMPARISON:  10/07/2014  FINDINGS: Loop recorder projects over LEFT chest.  Enlargement of cardiac silhouette with pulmonary vascular congestion.  Atherosclerotic calcification aorta.  Emphysematous and bronchitic changes consistent with COPD.  Chronic  accentuation of perihilar markings unchanged.  No gross infiltrate, pleural effusion or pneumothorax.  Calcified BILATERAL breast prostheses.  Marked osseous demineralization with multiple prior spinal augmentation procedures.  LEFT shoulder prosthesis.  IMPRESSION: COPD changes.  No acute abnormalities.   Electronically Signed   By: Lavonia Dana M.D.   On: 11/05/2014 08:48   Dg Knee 2 Views Left  11/05/2014   CLINICAL DATA:  Left leg foreshortened.  Left knee pain.  Fall.  EXAM: LEFT KNEE - 1-2 VIEW  COMPARISON:  None.  FINDINGS: Degenerative changes in the left knee with joint space narrowing, spurring, most pronounced in the lateral compartment. No fracture, subluxation or dislocation. Diffuse osteopenia. Soft tissues are intact and unremarkable.  IMPRESSION: Tricompartment degenerative changes.  No acute bony abnormality.   Electronically Signed   By: Rolm Baptise M.D.   On: 11/05/2014 08:49   Ct Head Wo Contrast  11/08/2014   CLINICAL DATA:  Dementia  EXAM: CT HEAD WITHOUT CONTRAST  TECHNIQUE: Contiguous axial images were obtained from the base of the skull through the vertex without intravenous contrast.  COMPARISON:  4/19/ 16  FINDINGS: No skull fracture is noted. Paranasal sinuses and mastoid air cells are unremarkable. Stable atrophy. Stable periventricular and patchy subcortical chronic white matter disease. Atherosclerotic calcifications of carotid siphon again noted. No acute cortical infarction. No mass lesion is noted on this unenhanced scan. The gray and white-matter differentiation is preserved.  IMPRESSION: No acute intracranial abnormality. Stable atrophy and chronic white matter disease.   Electronically Signed   By: Lahoma Crocker M.D.   On: 11/08/2014 14:06   Ct Head Wo Contrast  11/05/2014   CLINICAL DATA:  Fall, found on floor covered in urine at 0615 hours today dementia, LEFT hip pain, hypertension, Alzheimer's  EXAM: CT HEAD WITHOUT CONTRAST  CT CERVICAL SPINE WITHOUT CONTRAST   TECHNIQUE: Multidetector CT imaging of the head and cervical spine was performed following the standard protocol without intravenous contrast. Multiplanar CT image reconstructions of the cervical spine were also generated.  COMPARISON:  10/07/2014  FINDINGS: CT HEAD FINDINGS  Generalized atrophy.  Normal ventricular morphology.  No midline shift or mass effect.  Small vessel chronic ischemic changes of deep cerebral white matter.  No intracranial hemorrhage, mass lesion, or acute infarction.  Visualized paranasal sinuses and mastoid air cells clear.  Bones unremarkable.  CT CERVICAL SPINE FINDINGS  Beam hardening artifacts of dental origin and from LEFT shoulder prosthesis.  RIGHT thyroid  mass 4.1 x 4.1 x 2.5 cm image 63.  Visualized skullbase intact.  Diffuse osseous demineralization.  Prevertebral soft tissues normal thickness.  Disc space narrowing with endplate spur formation at C5-C6.  Multilevel facet degenerative changes.  Vertebral body heights maintained without fracture or subluxation.  No bone destruction.  Lung apices clear.  IMPRESSION: Atrophy with small vessel chronic ischemic changes of deep cerebral white matter.  No acute intracranial abnormalities.  Degenerative disc and facet disease changes cervical spine.  No acute cervical spine abnormalities.  RIGHT thyroid mass 4.1 x 4.1 x 2.5 cm; dedicated thyroid ultrasound followup recommended.   Electronically Signed   By: Lavonia Dana M.D.   On: 11/05/2014 10:46   Ct Cervical Spine Wo Contrast  11/05/2014   CLINICAL DATA:  Fall, found on floor covered in urine at 0615 hours today dementia, LEFT hip pain, hypertension, Alzheimer's  EXAM: CT HEAD WITHOUT CONTRAST  CT CERVICAL SPINE WITHOUT CONTRAST  TECHNIQUE: Multidetector CT imaging of the head and cervical spine was performed following the standard protocol without intravenous contrast. Multiplanar CT image reconstructions of the cervical spine were also generated.  COMPARISON:  10/07/2014  FINDINGS:  CT HEAD FINDINGS  Generalized atrophy.  Normal ventricular morphology.  No midline shift or mass effect.  Small vessel chronic ischemic changes of deep cerebral white matter.  No intracranial hemorrhage, mass lesion, or acute infarction.  Visualized paranasal sinuses and mastoid air cells clear.  Bones unremarkable.  CT CERVICAL SPINE FINDINGS  Beam hardening artifacts of dental origin and from LEFT shoulder prosthesis.  RIGHT thyroid mass 4.1 x 4.1 x 2.5 cm image 63.  Visualized skullbase intact.  Diffuse osseous demineralization.  Prevertebral soft tissues normal thickness.  Disc space narrowing with endplate spur formation at C5-C6.  Multilevel facet degenerative changes.  Vertebral body heights maintained without fracture or subluxation.  No bone destruction.  Lung apices clear.  IMPRESSION: Atrophy with small vessel chronic ischemic changes of deep cerebral white matter.  No acute intracranial abnormalities.  Degenerative disc and facet disease changes cervical spine.  No acute cervical spine abnormalities.  RIGHT thyroid mass 4.1 x 4.1 x 2.5 cm; dedicated thyroid ultrasound followup recommended.   Electronically Signed   By: Lavonia Dana M.D.   On: 11/05/2014 10:46   US Soft Tissue Head/neck  11/07/2014   CLINICAL DATA:  81 year old female with a history of thyroid mass.  EXAM: THYROID ULTRASOUND  TECHNIQUE: Ultrasound examination of the thyroid gland and adjacent soft tissues was performed.  COMPARISON:  None.  FINDINGS: Right thyroid lobe  Measurements: 5.7 cm x 3.5 cm x 3.0 cm. Solid right thyroid nodule measures 4.0 cm x 2.6 cm x 2.6 cm  Left thyroid lobe  Measurements: 3.5 cm x 1.4 cm x 1.4 cm.  No nodules visualized.  Isthmus  Thickness: 4 mm.  No nodules visualized.  Lymphadenopathy  None visualized.  IMPRESSION: Single right-sided nodule which meets criteria for biopsy.  Ultrasound-guided fine needle aspiration should be considered, as per the consensus statement: Management of Thyroid Nodules  Detected at [place]: Society of Radiologists in [place]. Radiology 2005; [id number].  Signed,  Dulcy Fanny. Earleen Newport, DO  [facility]  [facility] Radiology   Electronically Signed   By: Corrie Mckusick D.O.   On: 11/07/2014 08:17   Pelvis Portable  11/07/2014   CLINICAL DATA:  Status post left hip replacement  EXAM: PORTABLE PELVIS 1-2 VIEWS  COMPARISON:  None.  FINDINGS: A left hip prosthesis  is noted. No acute abnormality is noted. The pelvic ring is intact. A calcified uterine fibroid is seen.  IMPRESSION: Status post left hip replacement, no acute abnormality noted.   Electronically Signed   By: Inez Catalina M.D.   On: 11/07/2014 16:26   Dg Chest Port 1 View  11/09/2014   CLINICAL DATA:  81 year old female with a history of left hip surgery.  EXAM: PORTABLE CHEST - 1 VIEW  COMPARISON:  11/05/2014, 10/07/2014  FINDINGS: Cardiomediastinal silhouette unchanged in size and contour. Atherosclerotic calcifications of the aortic arch.  Unchanged position of Halter monitor on the left chest wall.  Diffusely coarsened interstitial markings. No confluent airspace disease. No pneumothorax or large pleural effusion.  Partially imaged surgical changes of the left shoulder.  Vertebroplasty of 3 levels again noted.  Surgical changes of prior breast augmentation.  IMPRESSION: No evidence of lobar pneumonia with background of chronic lung changes.  Atherosclerosis.  Signed,  Dulcy Fanny. Earleen Newport, DO  [facility]  [facility] Radiology   Electronically Signed   By: Corrie Mckusick D.O.   On: 11/09/2014 10:25   Dg Hip Operative Unilat With Pelvis Left  11/07/2014   CLINICAL DATA:  Subcapital femoral neck fracture  EXAM: OPERATIVE LEFT HIP WITH PELVIS  COMPARISON:  None.  FLUOROSCOPY TIME:  Radiation Exposure Index (as provided by the fluoroscopic device): Not available  If the device does not provide the exposure index:  Fluoroscopy  Time:  32 seconds  Number of Acquired Images:  Or  FINDINGS: The left subcapital femoral neck fracture is again noted left hip replacement was then performed. No acute abnormality is noted.  IMPRESSION: Status post left hip replacement   Electronically Signed   By: Inez Catalina M.D.   On: 11/07/2014 14:15   Dg Hip Unilat With Pelvis 2-3 Views Left  11/05/2014   CLINICAL DATA:  Status post fall at nursing home. Clinical shortening of the left leg with lateral rotation, severe pain  EXAM: LEFT HIP (WITH PELVIS) 2-3 VIEWS  COMPARISON:  None.  FINDINGS: The patient has sustained complete fracture through the subcapital region of the left hip. There is mild superior migration of the femur with respect to the femoral head. The bony pelvis is intact. There is a calcified fibroid demonstrated.  IMPRESSION: The patient has sustained an acute subcapital fracture of the left hip with superior migration of the femur with respect to the femoral head.   Electronically Signed   By: David  Martinique   On: 11/05/2014 08:48    ASSESSMENT/PLAN:  Left hip fracture S/P left hip hemiarthroplasty arthroplasty - for rehabilitation; continue OxyIR 5 mg 1-3 tabs by mouth every 4 hours when necessary and Tylenol 500 mg by mouth every 6 hours when necessary for a; Lovenox 40 mg SQ every daily for DVT prophylaxis; and follow-up with Dr. [name], orthosurgeon, in 2 weeks Alzheimer's dementia - stable COPD with emphysema - stable; continue albuterol 108 g/ACT inhaled 2 puffs by mouth every 6 hours when necessary Thyroid mass - incidentally noted on CT on right side; patient is asymptomatic; TSH 0.521, free T3  2.4, free T4  1.03, total T3  69; interventional radiologist consulted and recommended for repeat thyroid ultrasound in 6 months Constipation - continue senna S1 tab by mouth daily at bedtime and MiraLAX 17 g by mouth daily Depression - continue Paxil 40 mg by mouth every morning Insomnia - continue Restoril 7.5 mg by mouth daily at  bedtime   Goals of  care:  Short-term rehabilitation   Labs/test ordered:  CBC and BMP in 1 week; thyroid ultrasound in 6 months   Spent 50 minutes in patient care.    [facility], NP [employer] [phone]

## 2014-11-15 ENCOUNTER — Non-Acute Institutional Stay (SKILLED_NURSING_FACILITY): Payer: Medicare Other | Admitting: Internal Medicine

## 2014-11-15 DIAGNOSIS — G309 Alzheimer's disease, unspecified: Secondary | ICD-10-CM

## 2014-11-15 DIAGNOSIS — D62 Acute posthemorrhagic anemia: Secondary | ICD-10-CM

## 2014-11-15 DIAGNOSIS — K5901 Slow transit constipation: Secondary | ICD-10-CM

## 2014-11-15 DIAGNOSIS — J439 Emphysema, unspecified: Secondary | ICD-10-CM | POA: Diagnosis not present

## 2014-11-15 DIAGNOSIS — N179 Acute kidney failure, unspecified: Secondary | ICD-10-CM

## 2014-11-15 DIAGNOSIS — S72002S Fracture of unspecified part of neck of left femur, sequela: Secondary | ICD-10-CM

## 2014-11-15 DIAGNOSIS — F32A Depression, unspecified: Secondary | ICD-10-CM

## 2014-11-15 DIAGNOSIS — G4700 Insomnia, unspecified: Secondary | ICD-10-CM | POA: Diagnosis not present

## 2014-11-15 DIAGNOSIS — F329 Major depressive disorder, single episode, unspecified: Secondary | ICD-10-CM

## 2014-11-15 DIAGNOSIS — E079 Disorder of thyroid, unspecified: Secondary | ICD-10-CM | POA: Diagnosis not present

## 2014-11-15 DIAGNOSIS — F0280 Dementia in other diseases classified elsewhere without behavioral disturbance: Secondary | ICD-10-CM | POA: Diagnosis not present

## 2014-11-15 NOTE — Progress Notes (Signed)
Patient ID: Laura [name], female   DOB: 13-Jan-1924, 81 y.o.   MRN: [id number]     [facility]   PCP: Sande Brothers, MD  Code Status:full code  Allergies  Allergen Reactions  . Codeine     Makes her ill    Chief Complaint  Patient presents with  . New Admit To SNF     HPI:  81 year old patient is here for short term rehabilitation post hospital admission from 11/05/14-11/11/14 with fall and left femoral neck fracture. She underwent prosthetic replacement of femoral neck fracture. She has pmh of advanced dementia, copd, HTN among others. She had an incidental thyroid mass noted on CT scan this admission. She is seen in her room today. She is pleasantly confused and denies any concerns. No concern from staff.  Review of Systems:  Constitutional: Negative for fever, chills HENT: Negative for headache, congestion Eyes: Negative for eye pain, blurred vision Respiratory: Negative for shortness of breath and wheezing.  positive for cough. On o2 Cardiovascular: Negative for chest pain, palpitations, leg swelling.  Gastrointestinal: Negative for heartburn, nausea, vomiting, abdominal pain Genitourinary: Negative for dysuria Musculoskeletal: Negative for falls in facility Skin: Negative for itching, rash.  Neurological: Negative for dizziness, tingling Psychiatric/Behavioral: Negative for depression   Past Medical History  Diagnosis Date  . Hypertension   . Arthritis   . Alzheimer's dementia   . Anxiety   . Depression    Past Surgical History  Procedure Laterality Date  . Shoulder surgery      Left   . Loop recorder implant N/A 06/22/2012    Procedure: LOOP RECORDER IMPLANT;  Surgeon: Sanda Klein, MD;  Location: [place] CATH LAB;  Service: Cardiovascular;  Laterality: N/A;  . Total hip arthroplasty Left 11/07/2014    Procedure: Left hip anterior hemiarthroplasty;  Surgeon: Leandrew Koyanagi, MD;  Location: [place];  Service: Orthopedics;   Laterality: Left;   Social History:   reports that she has quit smoking. She has never used smokeless tobacco. She reports that she does not drink alcohol or use illicit drugs.  No family history on file.  Medications: Patient's Medications  New Prescriptions   No medications on file  Previous Medications   ACETAMINOPHEN (MAPAP) 500 MG TABLET    Take 500 mg by mouth every 6 (six) hours as needed for mild pain, fever or headache.    ALBUTEROL (PROVENTIL HFA;VENTOLIN HFA) 108 (90 BASE) MCG/ACT INHALER    Inhale 2 puffs into the lungs every 6 (six) hours as needed for wheezing or shortness of breath.   ALUM & MAG HYDROXIDE-SIMETH (MI-ACID MAXIMUM STRENGTH) 400-400-40 MG/5ML SUSPENSION    Take 30 mLs by mouth every 6 (six) hours as needed for indigestion.    CARBOMER GEL BASE (HYDROGEL) GEL    Apply 1 application topically daily as needed (for rash). Apply to face   CHOLECALCIFEROL (VITAMIN D) 1000 UNITS TABLET    Take 1,000 Units by mouth daily with breakfast.    ENOXAPARIN (LOVENOX) 40 MG/0.4ML INJECTION    Inject 0.4 mLs (40 mg total) into the skin daily.   GUAIFENESIN (Q-TUSSIN) 100 MG/5ML SYRUP    Take 200 mg by mouth every 6 (six) hours as needed for cough.    LACTOSE FREE NUTRITION (BOOST PLUS) LIQD    Take 237 mLs by mouth 2 (two) times daily between meals.   LOPERAMIDE (IMODIUM) 2 MG CAPSULE    Take 2 mg by mouth as needed for diarrhea or  loose stools. NOT TO EXCEED 8 DOSES IN 24 HR   MAGNESIUM HYDROXIDE (MILK OF MAGNESIA) 400 MG/5ML SUSPENSION    Take 30 mLs by mouth daily as needed for mild constipation.    MELOXICAM (MOBIC) 7.5 MG TABLET    Take 7.5 mg by mouth daily with breakfast.   MULTIPLE VITAMINS-MINERALS (MULTIVITAMIN) TABLET    Take 1 tablet by mouth daily.   NAPHAZOLINE-GLYCERIN-ZINC SULF (CLEAR EYES MAXIMUM ITCHY EYE OP)    Place 1-2 drops into both eyes 3 (three) times daily as needed (for itching and redness). Wait 3 to 5 minutes between each drop    NEOMYCIN-BACITRACIN-POLYMYXIN (HCA TRIPLE ANTIBIOTIC OINTMENT EX)    Apply 1 application topically as needed (for wound care).    OXYCODONE (OXY IR/ROXICODONE) 5 MG IMMEDIATE RELEASE TABLET    Take 1-3 tablets (5-15 mg total) by mouth every 4 (four) hours as needed.   PAROXETINE (PAXIL) 40 MG TABLET    Take 40 mg by mouth every morning.   POLYETHYLENE GLYCOL (MIRALAX / GLYCOLAX) PACKET    Take 17 g by mouth daily as needed for mild constipation.    SENNA-DOCUSATE (SENOKOT-S) 8.6-50 MG PER TABLET    Take 1 tablet by mouth at bedtime.   TEMAZEPAM (RESTORIL) 7.5 MG CAPSULE    Take 1 capsule (7.5 mg total) by mouth at bedtime.  Modified Medications   No medications on file  Discontinued Medications   No medications on file     Physical Exam Filed Vitals:   11/15/14 1106  BP: 139/64  Pulse: 80  Temp: 98 F (36.7 C)  Resp: 18  SpO2: 98%    General- elderly female, in no acute distress Head- normocephalic, atraumatic Throat- moist mucus membrane Neck- no cervical lymphadenopathy Cardiovascular- normal s1,s2, no murmurs, palpable dorsalis pedis and radial pulses, left leg edema Respiratory- bilateral decreased air entry, on o2, no wheeze, no rhonchi, no crackles, no use of accessory muscles Abdomen- bowel sounds present, soft, non tender Musculoskeletal- able to move all 4 extremities, left leg limited range of motion  Neurological- pleasantly confused, oriented only to self Skin- warm and dry. Surgical dressing on left thigh clean and dry Psychiatry-  normal mood and affect    Labs reviewed: Basic Metabolic Panel:  Recent Labs  11/06/14 0909  11/09/14 0535 11/10/14 0535 11/10/14 0846 11/11/14 0512  NA 140  < > 145 139  --  139  K 3.9  < > 4.1 6.0* 4.0 3.7  CL 104  < > 108 105  --  101  CO2 29  < > 29 27  --  30  GLUCOSE 101*  < > 103* 85  --  97  BUN 20  < > 26* 29*  --  23  CREATININE 1.05  < > 1.29* 1.06  --  1.13*  CALCIUM 8.9  < > 8.3* 8.0*  --  8.3*  MG 1.9  --    --   --   --   --   < > = values in this interval not displayed. Liver Function Tests:  Recent Labs  10/22/14 1532 10/23/14 0543 11/05/14 0805  AST 20 21 22   ALT 20 18 16   ALKPHOS 59 53 73  BILITOT 0.5 0.4 0.6  PROT 6.1 5.6* 6.7  ALBUMIN 3.9 3.5 4.4   No results for input(s): LIPASE, AMYLASE in the last 8760 hours. No results for input(s): AMMONIA in the last 8760 hours. CBC:  Recent Labs  10/08/14 0458  11/05/14  0805 11/06/14 0909  11/08/14 0652 11/08/14 1308 11/09/14 0535 11/10/14 0535  WBC 8.7  < > 16.5* 13.1*  < > 9.9  --  9.4 8.7  NEUTROABS 6.2  --  14.8* 10.7*  --   --   --   --   --   HGB 14.3  < > 14.8 14.4  < > 11.2*  --  11.1* 10.6*  HCT 46.8*  < > 47.2* 45.6  < > 36.3 32.6* 36.6 34.2*  MCV 93.4  < > 92.5 91.0  < > 92.6  --  93.1 92.4  PLT 212  < > 237 227  < > 183  --  198 208  < > = values in this interval not displayed. Cardiac Enzymes:  Recent Labs  10/08/14 0255 10/08/14 1000 10/08/14 1448  TROPONINI <0.03 <0.03 <0.03   BNP: Invalid input(s): POCBNP CBG:  Recent Labs  10/23/14 0720 10/24/14 0734 10/25/14 0731  GLUCAP 88 84 83    Lab Results  Component Value Date   TSH 0.521 11/06/2014    Assessment/Plan  Left femoral neck fracture S/p left hip hemiarthroplasty. Has follow up with orthopedics. On pain medication but confused and not asking for it. Will change pain regimen to tylenol 650 mg tid. D/c oxyIR. Have her on tramadol 50 mg 1-2 tab q6h prn pain. Continue mobic 7.5 mg daily.  Continue Lovenox for DVT prophylaxis. Will have patient work with PT/OT as tolerated to regain strength and restore function.  Fall precautions are in place.  Alzheimer's dementia Persists, decline anticipated, this can interfere with her progress and participation with therapy. Fall precautions. Assistance with ADLs as needed. Continue MVI, medpass supplement and vitamin d supplement. Skin care with pressure ulcer prophylaxis  COPD with  emphysema Continue o2. On prn albuterol MDI. Have her on albuterol nebulizer tid x 3 days with her wet cough and then q6h prn   Constipation continue senna S and miralax  Blood loss anemia Post op, monitor h&h  Renal impairment Monitor bmp  Depression Stable. continue Paxil 40 mg daily  Insomnia  continue Restoril 7.5 mg daily  Thyroid mass Incidental finding, thyroid panel normal, f/u with ultrasound of thyroid in 6 months    Goals of care: short term rehabilitation   Labs/tests ordered: cbc, bmp 1 week  Family/ staff Communication: reviewed care plan with patient and nursing supervisor    Blanchie Serve, MD  [place] (Monday-Friday 8 am - 5 pm) [phone] (afterhours)

## 2014-11-18 ENCOUNTER — Other Ambulatory Visit: Payer: Self-pay | Admitting: *Deleted

## 2014-11-18 MED ORDER — TRAMADOL HCL 50 MG PO TABS: ORAL_TABLET | ORAL | Status: DC

## 2014-11-18 NOTE — Telephone Encounter (Signed)
[facility] 

## 2014-11-21 ENCOUNTER — Ambulatory Visit
Admission: RE | Admit: 2014-11-21 | Discharge: 2014-11-21 | Disposition: A | Discharge status: Home / Self Care | Payer: Medicare Other | Source: Ambulatory Visit | Attending: Orthopaedic Surgery | Admitting: Orthopaedic Surgery

## 2014-11-21 ENCOUNTER — Other Ambulatory Visit: Payer: Self-pay | Admitting: Orthopaedic Surgery

## 2014-11-21 DIAGNOSIS — S72302A Unspecified fracture of shaft of left femur, initial encounter for closed fracture: Secondary | ICD-10-CM | POA: Diagnosis not present

## 2014-11-21 DIAGNOSIS — S72002D Fracture of unspecified part of neck of left femur, subsequent encounter for closed fracture with routine healing: Secondary | ICD-10-CM | POA: Diagnosis not present

## 2014-11-21 DIAGNOSIS — M898X5 Other specified disorders of bone, thigh: Secondary | ICD-10-CM

## 2014-11-21 DIAGNOSIS — T84041A Periprosthetic fracture around internal prosthetic left hip joint, initial encounter: Secondary | ICD-10-CM | POA: Diagnosis not present

## 2014-11-21 DIAGNOSIS — S72112A Displaced fracture of greater trochanter of left femur, initial encounter for closed fracture: Secondary | ICD-10-CM | POA: Diagnosis not present

## 2014-11-22 ENCOUNTER — Other Ambulatory Visit (HOSPITAL_COMMUNITY): Payer: Self-pay | Admitting: Orthopaedic Surgery

## 2014-11-26 ENCOUNTER — Encounter (HOSPITAL_COMMUNITY): Payer: Self-pay | Admitting: *Deleted

## 2014-11-26 MED ORDER — CEFAZOLIN SODIUM-DEXTROSE 2-3 GM-% IV SOLR
2.0000 g | INTRAVENOUS | Status: AC
  Administered 2014-11-27: 2 g via INTRAVENOUS
  Filled 2014-11-26: qty 50

## 2014-11-26 MED ORDER — BUPIVACAINE LIPOSOME 1.3 % IJ SUSP
20.0000 mL | INTRAMUSCULAR | Status: DC
  Filled 2014-11-26: qty 20

## 2014-11-26 NOTE — Progress Notes (Addendum)
I spoke to Dr Phoebe Sharps scheduler and heard her ask him if patient is to hold Lovenox and Dr [name] reported yes.  I spoke to patients POA, Josiah Lobo and he stated that he will be here to sign consent.

## 2014-11-26 NOTE — Pre-Procedure Instructions (Signed)
[place]  11/26/2014   Your procedure is scheduled on:  Wednesday, May 11  Report to [facility] at 1045 AM.  Call this number if you have problems the morning of surgery: [phone]   Remember:   Do not eat food or drink liquids after midnight.   Take these medicines the morning of surgery with A SIP OF WATER: Paxil.                   DO Not GIve Lovenox 11/27/14- per Dr [name]                          May take Tylenol or Tramadol, Robitussion and may use clear eyes if needed.             Stop taking Mobic prior to surgery.   Do not wear jewelry, make-up or nail polish.  Do not wear lotions, powders, or perfumes.  Do not shave 48 hours prior to surgery.   Do not bring valuables to the hospital.                 Contacts, dentures or bridgework may not be worn into surgery.

## 2014-11-26 NOTE — Pre-Procedure Instructions (Signed)
Laura [name]  11/26/2014   Your procedure is scheduled on:  Wednesday, May 11  Report to [place] at 1045 AM.  Call this number if you have problems the morning of surgery: [phone]   Remember:   Do not eat food or drink liquids after midnight.   Take these medicines the morning of surgery with A SIP OF WATER: Paxil.                              May take Tylenol or Tramadol, Robitussion and may use clear eyes if needed.             Stop taking Mobic prior to surgery.   Do not wear jewelry, make-up or nail polish.  Do not wear lotions, powders, or perfumes.  Do not shave 48 hours prior to surgery.   Do not bring valuables to the hospital.                 Contacts, dentures or bridgework may not be worn into surgery.

## 2014-11-27 ENCOUNTER — Encounter (HOSPITAL_COMMUNITY): Payer: Self-pay | Admitting: *Deleted

## 2014-11-27 ENCOUNTER — Inpatient Hospital Stay (HOSPITAL_COMMUNITY): Payer: Medicare Other

## 2014-11-27 ENCOUNTER — Inpatient Hospital Stay (HOSPITAL_COMMUNITY): Payer: Medicare Other | Admitting: Anesthesiology

## 2014-11-27 ENCOUNTER — Inpatient Hospital Stay (HOSPITAL_COMMUNITY)
Admission: RE | Admit: 2014-11-27 | Discharge: 2014-12-02 | DRG: 467 | Disposition: A | Discharge status: Skilled Nursing Facility | Payer: Medicare Other | Source: Ambulatory Visit | Attending: Orthopaedic Surgery | Admitting: Orthopaedic Surgery

## 2014-11-27 ENCOUNTER — Encounter (HOSPITAL_COMMUNITY)
Admission: RE | Disposition: A | Discharge status: Skilled Nursing Facility | Payer: Self-pay | Source: Ambulatory Visit | Attending: Orthopaedic Surgery

## 2014-11-27 DIAGNOSIS — F419 Anxiety disorder, unspecified: Secondary | ICD-10-CM | POA: Diagnosis present

## 2014-11-27 DIAGNOSIS — T84041D Periprosthetic fracture around internal prosthetic left hip joint, subsequent encounter: Secondary | ICD-10-CM | POA: Diagnosis not present

## 2014-11-27 DIAGNOSIS — N390 Urinary tract infection, site not specified: Secondary | ICD-10-CM | POA: Diagnosis not present

## 2014-11-27 DIAGNOSIS — D72829 Elevated white blood cell count, unspecified: Secondary | ICD-10-CM | POA: Diagnosis present

## 2014-11-27 DIAGNOSIS — F329 Major depressive disorder, single episode, unspecified: Secondary | ICD-10-CM | POA: Diagnosis present

## 2014-11-27 DIAGNOSIS — Z9981 Dependence on supplemental oxygen: Secondary | ICD-10-CM | POA: Diagnosis not present

## 2014-11-27 DIAGNOSIS — Z950 Presence of cardiac pacemaker: Secondary | ICD-10-CM

## 2014-11-27 DIAGNOSIS — D62 Acute posthemorrhagic anemia: Secondary | ICD-10-CM | POA: Diagnosis not present

## 2014-11-27 DIAGNOSIS — G9340 Encephalopathy, unspecified: Secondary | ICD-10-CM | POA: Diagnosis not present

## 2014-11-27 DIAGNOSIS — Z79899 Other long term (current) drug therapy: Secondary | ICD-10-CM | POA: Diagnosis not present

## 2014-11-27 DIAGNOSIS — I447 Left bundle-branch block, unspecified: Secondary | ICD-10-CM | POA: Diagnosis present

## 2014-11-27 DIAGNOSIS — F0280 Dementia in other diseases classified elsewhere without behavioral disturbance: Secondary | ICD-10-CM | POA: Diagnosis present

## 2014-11-27 DIAGNOSIS — Z96642 Presence of left artificial hip joint: Secondary | ICD-10-CM | POA: Diagnosis not present

## 2014-11-27 DIAGNOSIS — W19XXXA Unspecified fall, initial encounter: Secondary | ICD-10-CM | POA: Diagnosis present

## 2014-11-27 DIAGNOSIS — M1610 Unilateral primary osteoarthritis, unspecified hip: Secondary | ICD-10-CM | POA: Diagnosis present

## 2014-11-27 DIAGNOSIS — J449 Chronic obstructive pulmonary disease, unspecified: Secondary | ICD-10-CM | POA: Diagnosis present

## 2014-11-27 DIAGNOSIS — R2681 Unsteadiness on feet: Secondary | ICD-10-CM | POA: Diagnosis not present

## 2014-11-27 DIAGNOSIS — Y92129 Unspecified place in nursing home as the place of occurrence of the external cause: Secondary | ICD-10-CM

## 2014-11-27 DIAGNOSIS — Z885 Allergy status to narcotic agent status: Secondary | ICD-10-CM

## 2014-11-27 DIAGNOSIS — T84041A Periprosthetic fracture around internal prosthetic left hip joint, initial encounter: Principal | ICD-10-CM | POA: Diagnosis present

## 2014-11-27 DIAGNOSIS — G309 Alzheimer's disease, unspecified: Secondary | ICD-10-CM | POA: Diagnosis present

## 2014-11-27 DIAGNOSIS — R41841 Cognitive communication deficit: Secondary | ICD-10-CM | POA: Diagnosis not present

## 2014-11-27 DIAGNOSIS — I10 Essential (primary) hypertension: Secondary | ICD-10-CM | POA: Diagnosis present

## 2014-11-27 DIAGNOSIS — M9702XA Periprosthetic fracture around internal prosthetic left hip joint, initial encounter: Secondary | ICD-10-CM

## 2014-11-27 DIAGNOSIS — Z87891 Personal history of nicotine dependence: Secondary | ICD-10-CM

## 2014-11-27 DIAGNOSIS — I2510 Atherosclerotic heart disease of native coronary artery without angina pectoris: Secondary | ICD-10-CM | POA: Diagnosis present

## 2014-11-27 DIAGNOSIS — M21252 Flexion deformity, left hip: Secondary | ICD-10-CM | POA: Diagnosis not present

## 2014-11-27 DIAGNOSIS — R296 Repeated falls: Secondary | ICD-10-CM | POA: Diagnosis present

## 2014-11-27 DIAGNOSIS — Z419 Encounter for procedure for purposes other than remedying health state, unspecified: Secondary | ICD-10-CM

## 2014-11-27 DIAGNOSIS — D649 Anemia, unspecified: Secondary | ICD-10-CM | POA: Diagnosis not present

## 2014-11-27 DIAGNOSIS — Z471 Aftercare following joint replacement surgery: Secondary | ICD-10-CM | POA: Diagnosis not present

## 2014-11-27 DIAGNOSIS — M1990 Unspecified osteoarthritis, unspecified site: Secondary | ICD-10-CM | POA: Diagnosis present

## 2014-11-27 DIAGNOSIS — M6281 Muscle weakness (generalized): Secondary | ICD-10-CM | POA: Diagnosis not present

## 2014-11-27 DIAGNOSIS — Z96649 Presence of unspecified artificial hip joint: Secondary | ICD-10-CM

## 2014-11-27 DIAGNOSIS — R1312 Dysphagia, oropharyngeal phase: Secondary | ICD-10-CM | POA: Diagnosis not present

## 2014-11-27 DIAGNOSIS — E875 Hyperkalemia: Secondary | ICD-10-CM | POA: Diagnosis not present

## 2014-11-27 DIAGNOSIS — R278 Other lack of coordination: Secondary | ICD-10-CM | POA: Diagnosis not present

## 2014-11-27 DIAGNOSIS — Z9181 History of falling: Secondary | ICD-10-CM | POA: Diagnosis not present

## 2014-11-27 DIAGNOSIS — I517 Cardiomegaly: Secondary | ICD-10-CM | POA: Diagnosis not present

## 2014-11-27 HISTORY — DX: Presence of cardiac pacemaker: Z95.0

## 2014-11-27 HISTORY — DX: Chronic obstructive pulmonary disease, unspecified: J44.9

## 2014-11-27 HISTORY — PX: ANTERIOR HIP REVISION: SHX6527

## 2014-11-27 LAB — CBC WITH DIFFERENTIAL/PLATELET
BASOS ABS: 0 10*3/uL (ref 0.0–0.1)
BASOS PCT: 0 % (ref 0–1)
Eosinophils Absolute: 0.2 10*3/uL (ref 0.0–0.7)
Eosinophils Relative: 1 % (ref 0–5)
HCT: 35.1 % — ABNORMAL LOW (ref 36.0–46.0)
HEMOGLOBIN: 10.5 g/dL — AB (ref 12.0–15.0)
LYMPHS PCT: 11 % — AB (ref 12–46)
Lymphs Abs: 1.4 10*3/uL (ref 0.7–4.0)
MCH: 27.1 pg (ref 26.0–34.0)
MCHC: 29.9 g/dL — ABNORMAL LOW (ref 30.0–36.0)
MCV: 90.5 fL (ref 78.0–100.0)
Monocytes Absolute: 0.8 10*3/uL (ref 0.1–1.0)
Monocytes Relative: 6 % (ref 3–12)
NEUTROS PCT: 82 % — AB (ref 43–77)
Neutro Abs: 10.7 10*3/uL — ABNORMAL HIGH (ref 1.7–7.7)
PLATELETS: 468 10*3/uL — AB (ref 150–400)
RBC: 3.88 MIL/uL (ref 3.87–5.11)
RDW: 14.6 % (ref 11.5–15.5)
WBC: 13 10*3/uL — ABNORMAL HIGH (ref 4.0–10.5)

## 2014-11-27 LAB — URINALYSIS, ROUTINE W REFLEX MICROSCOPIC
BILIRUBIN URINE: NEGATIVE
Glucose, UA: NEGATIVE mg/dL
KETONES UR: NEGATIVE mg/dL
NITRITE: POSITIVE — AB
Protein, ur: 30 mg/dL — AB
SPECIFIC GRAVITY, URINE: 1.02 (ref 1.005–1.030)
Urobilinogen, UA: 0.2 mg/dL (ref 0.0–1.0)
pH: 6 (ref 5.0–8.0)

## 2014-11-27 LAB — CBC
HEMATOCRIT: 28.1 % — AB (ref 36.0–46.0)
Hemoglobin: 8.4 g/dL — ABNORMAL LOW (ref 12.0–15.0)
MCH: 27.1 pg (ref 26.0–34.0)
MCHC: 29.9 g/dL — ABNORMAL LOW (ref 30.0–36.0)
MCV: 90.6 fL (ref 78.0–100.0)
Platelets: 447 10*3/uL — ABNORMAL HIGH (ref 150–400)
RBC: 3.1 MIL/uL — AB (ref 3.87–5.11)
RDW: 14.8 % (ref 11.5–15.5)
WBC: 16.2 10*3/uL — ABNORMAL HIGH (ref 4.0–10.5)

## 2014-11-27 LAB — APTT: aPTT: 27 seconds (ref 24–37)

## 2014-11-27 LAB — COMPREHENSIVE METABOLIC PANEL
ALBUMIN: 2.9 g/dL — AB (ref 3.5–5.0)
ALT: 17 U/L (ref 14–54)
AST: 24 U/L (ref 15–41)
Alkaline Phosphatase: 153 U/L — ABNORMAL HIGH (ref 38–126)
Anion gap: 7 (ref 5–15)
BILIRUBIN TOTAL: 0.6 mg/dL (ref 0.3–1.2)
BUN: 23 mg/dL — ABNORMAL HIGH (ref 6–20)
CALCIUM: 9 mg/dL (ref 8.9–10.3)
CHLORIDE: 107 mmol/L (ref 101–111)
CO2: 27 mmol/L (ref 22–32)
CREATININE: 1.13 mg/dL — AB (ref 0.44–1.00)
GFR calc Af Amer: 49 mL/min — ABNORMAL LOW (ref >60)
GFR calc non Af Amer: 42 mL/min — ABNORMAL LOW (ref >60)
Glucose, Bld: 94 mg/dL (ref 70–99)
Potassium: 4.7 mmol/L (ref 3.5–5.1)
SODIUM: 141 mmol/L (ref 135–145)
TOTAL PROTEIN: 5.6 g/dL — AB (ref 6.5–8.1)

## 2014-11-27 LAB — URINE MICROSCOPIC-ADD ON

## 2014-11-27 LAB — SEDIMENTATION RATE: Sed Rate: 21 mm/hr (ref 0–22)

## 2014-11-27 LAB — PROTIME-INR
INR: 1.02 (ref 0.00–1.49)
Prothrombin Time: 13.5 seconds (ref 11.6–15.2)

## 2014-11-27 LAB — C-REACTIVE PROTEIN: CRP: 4.1 mg/dL — ABNORMAL HIGH (ref <1.0)

## 2014-11-27 LAB — ABO/RH: ABO/RH(D): A NEG

## 2014-11-27 SURGERY — REVISION, TOTAL ARTHROPLASTY, HIP, ANTERIOR APPROACH
Anesthesia: General | Site: Hip | Laterality: Left

## 2014-11-27 MED ORDER — PROMETHAZINE HCL 25 MG/ML IJ SOLN: 6.2500 mg | INTRAMUSCULAR | Status: DC | PRN

## 2014-11-27 MED ORDER — ENOXAPARIN SODIUM 40 MG/0.4ML ~~LOC~~ SOLN: 40.0000 mg | Freq: Every day | SUBCUTANEOUS | Status: DC

## 2014-11-27 MED ORDER — ONDANSETRON HCL 4 MG/2ML IJ SOLN
INTRAMUSCULAR | Status: DC | PRN
  Administered 2014-11-27: 4 mg via INTRAVENOUS

## 2014-11-27 MED ORDER — ALBUTEROL SULFATE (2.5 MG/3ML) 0.083% IN NEBU: 2.5000 mg | INHALATION_SOLUTION | Freq: Four times a day (QID) | RESPIRATORY_TRACT | Status: DC | PRN

## 2014-11-27 MED ORDER — BACLOFEN 10 MG PO TABS: 10.0000 mg | ORAL_TABLET | Freq: Three times a day (TID) | ORAL | Status: DC | PRN

## 2014-11-27 MED ORDER — ACETAMINOPHEN 500 MG PO TABS: 500.0000 mg | ORAL_TABLET | Freq: Four times a day (QID) | ORAL | Status: DC | PRN

## 2014-11-27 MED ORDER — ACETAMINOPHEN 500 MG PO TABS
1000.0000 mg | ORAL_TABLET | Freq: Four times a day (QID) | ORAL | Status: AC
  Administered 2014-11-27 – 2014-11-28 (×3): 1000 mg via ORAL
  Filled 2014-11-27 (×3): qty 2

## 2014-11-27 MED ORDER — NEOSTIGMINE METHYLSULFATE 10 MG/10ML IV SOLN
INTRAVENOUS | Status: AC
  Filled 2014-11-27: qty 1

## 2014-11-27 MED ORDER — MENTHOL 3 MG MT LOZG: 1.0000 | LOZENGE | OROMUCOSAL | Status: DC | PRN

## 2014-11-27 MED ORDER — METOCLOPRAMIDE HCL 5 MG/ML IJ SOLN
5.0000 mg | Freq: Three times a day (TID) | INTRAMUSCULAR | Status: DC | PRN
  Administered 2014-11-30: 10 mg via INTRAVENOUS
  Filled 2014-11-27: qty 2

## 2014-11-27 MED ORDER — SUCCINYLCHOLINE CHLORIDE 20 MG/ML IJ SOLN
INTRAMUSCULAR | Status: DC | PRN
  Administered 2014-11-27: 100 mg via INTRAVENOUS

## 2014-11-27 MED ORDER — DIPHENHYDRAMINE HCL 12.5 MG/5ML PO ELIX: 25.0000 mg | ORAL_SOLUTION | ORAL | Status: DC | PRN

## 2014-11-27 MED ORDER — ONDANSETRON HCL 4 MG/2ML IJ SOLN
INTRAMUSCULAR | Status: AC
  Filled 2014-11-27: qty 2

## 2014-11-27 MED ORDER — DEXTROSE 5 % IV SOLN
INTRAVENOUS | Status: DC | PRN
  Administered 2014-11-27: 13:00:00 via INTRAVENOUS

## 2014-11-27 MED ORDER — FENTANYL CITRATE (PF) 100 MCG/2ML IJ SOLN
INTRAMUSCULAR | Status: DC | PRN
  Administered 2014-11-27: 100 ug via INTRAVENOUS
  Administered 2014-11-27: 50 ug via INTRAVENOUS
  Administered 2014-11-27: 100 ug via INTRAVENOUS

## 2014-11-27 MED ORDER — CHLORHEXIDINE GLUCONATE 4 % EX LIQD
60.0000 mL | Freq: Once | CUTANEOUS | Status: DC
  Filled 2014-11-27: qty 60

## 2014-11-27 MED ORDER — LIDOCAINE HCL (CARDIAC) 20 MG/ML IV SOLN
INTRAVENOUS | Status: DC | PRN
  Administered 2014-11-27: 70 mg via INTRAVENOUS

## 2014-11-27 MED ORDER — ROCURONIUM BROMIDE 100 MG/10ML IV SOLN
INTRAVENOUS | Status: DC | PRN
  Administered 2014-11-27: 25 mg via INTRAVENOUS
  Administered 2014-11-27: 10 mg via INTRAVENOUS
  Administered 2014-11-27: 15 mg via INTRAVENOUS

## 2014-11-27 MED ORDER — LIDOCAINE HCL (CARDIAC) 20 MG/ML IV SOLN
INTRAVENOUS | Status: AC
  Filled 2014-11-27: qty 5

## 2014-11-27 MED ORDER — VITAMIN D 1000 UNITS PO TABS
1000.0000 [IU] | ORAL_TABLET | Freq: Every day | ORAL | Status: DC
Start: 2014-11-28 — End: 2014-12-02
  Administered 2014-11-28 – 2014-12-02 (×5): 1000 [IU] via ORAL
  Filled 2014-11-27 (×5): qty 1

## 2014-11-27 MED ORDER — HYDROGEL GEL: 1.0000 "application " | Freq: Every day | Status: DC | PRN

## 2014-11-27 MED ORDER — KETOROLAC TROMETHAMINE 15 MG/ML IJ SOLN
7.5000 mg | Freq: Four times a day (QID) | INTRAMUSCULAR | Status: DC
  Administered 2014-11-27: 7.5 mg via INTRAVENOUS
  Filled 2014-11-27 (×3): qty 1

## 2014-11-27 MED ORDER — KETOROLAC TROMETHAMINE 30 MG/ML IJ SOLN
INTRAMUSCULAR | Status: AC
Start: 2014-11-27 — End: 2014-11-28
  Filled 2014-11-27: qty 1

## 2014-11-27 MED ORDER — FENTANYL CITRATE (PF) 100 MCG/2ML IJ SOLN: 25.0000 ug | INTRAMUSCULAR | Status: DC | PRN

## 2014-11-27 MED ORDER — SENNOSIDES-DOCUSATE SODIUM 8.6-50 MG PO TABS
1.0000 | ORAL_TABLET | Freq: Every day | ORAL | Status: DC
  Administered 2014-11-27 – 2014-11-30 (×4): 1 via ORAL
  Filled 2014-11-27 (×4): qty 1

## 2014-11-27 MED ORDER — TEMAZEPAM 7.5 MG PO CAPS
7.5000 mg | ORAL_CAPSULE | Freq: Every day | ORAL | Status: DC
  Administered 2014-11-27 – 2014-12-01 (×5): 7.5 mg via ORAL
  Filled 2014-11-27 (×5): qty 1

## 2014-11-27 MED ORDER — SUCCINYLCHOLINE CHLORIDE 20 MG/ML IJ SOLN
INTRAMUSCULAR | Status: AC
  Filled 2014-11-27: qty 1

## 2014-11-27 MED ORDER — PROPOFOL 10 MG/ML IV BOLUS
INTRAVENOUS | Status: AC
  Filled 2014-11-27: qty 20

## 2014-11-27 MED ORDER — CELECOXIB 200 MG PO CAPS
200.0000 mg | ORAL_CAPSULE | Freq: Two times a day (BID) | ORAL | Status: DC
  Filled 2014-11-27: qty 1

## 2014-11-27 MED ORDER — GLYCOPYRROLATE 0.2 MG/ML IJ SOLN
INTRAMUSCULAR | Status: AC
  Filled 2014-11-27: qty 3

## 2014-11-27 MED ORDER — ARTIFICIAL TEARS OP OINT
TOPICAL_OINTMENT | OPHTHALMIC | Status: AC
  Filled 2014-11-27: qty 3.5

## 2014-11-27 MED ORDER — LOPERAMIDE HCL 2 MG PO CAPS
2.0000 mg | ORAL_CAPSULE | ORAL | Status: DC | PRN
  Filled 2014-11-27: qty 1

## 2014-11-27 MED ORDER — ALUM & MAG HYDROXIDE-SIMETH 400-400-40 MG/5ML PO SUSP: 30.0000 mL | Freq: Four times a day (QID) | ORAL | Status: DC | PRN

## 2014-11-27 MED ORDER — SODIUM CHLORIDE 0.9 % IR SOLN
Status: DC | PRN
  Administered 2014-11-27: 1000 mL

## 2014-11-27 MED ORDER — GUAIFENESIN 100 MG/5ML PO SYRP
200.0000 mg | ORAL_SOLUTION | Freq: Four times a day (QID) | ORAL | Status: DC | PRN
  Filled 2014-11-27 (×2): qty 10

## 2014-11-27 MED ORDER — LABETALOL HCL 5 MG/ML IV SOLN
INTRAVENOUS | Status: AC
  Filled 2014-11-27: qty 4

## 2014-11-27 MED ORDER — MORPHINE SULFATE 2 MG/ML IJ SOLN: 1.0000 mg | INTRAMUSCULAR | Status: DC | PRN

## 2014-11-27 MED ORDER — ONDANSETRON HCL 4 MG PO TABS: 4.0000 mg | ORAL_TABLET | Freq: Four times a day (QID) | ORAL | Status: DC | PRN

## 2014-11-27 MED ORDER — PAROXETINE HCL 20 MG PO TABS
40.0000 mg | ORAL_TABLET | Freq: Every day | ORAL | Status: DC
  Administered 2014-11-28 – 2014-12-02 (×5): 40 mg via ORAL
  Filled 2014-11-27 (×5): qty 2

## 2014-11-27 MED ORDER — MIDAZOLAM HCL 2 MG/2ML IJ SOLN
INTRAMUSCULAR | Status: AC
  Filled 2014-11-27: qty 2

## 2014-11-27 MED ORDER — METOCLOPRAMIDE HCL 5 MG PO TABS
5.0000 mg | ORAL_TABLET | Freq: Three times a day (TID) | ORAL | Status: DC | PRN
Start: 2014-11-27 — End: 2014-12-02

## 2014-11-27 MED ORDER — FENTANYL CITRATE (PF) 250 MCG/5ML IJ SOLN
INTRAMUSCULAR | Status: AC
  Filled 2014-11-27: qty 5

## 2014-11-27 MED ORDER — PROPOFOL 10 MG/ML IV BOLUS
INTRAVENOUS | Status: DC | PRN
  Administered 2014-11-27 (×2): 30 mg via INTRAVENOUS
  Administered 2014-11-27: 130 mg via INTRAVENOUS

## 2014-11-27 MED ORDER — KETOROLAC TROMETHAMINE 15 MG/ML IJ SOLN: 7.5000 mg | Freq: Four times a day (QID) | INTRAMUSCULAR | Status: DC

## 2014-11-27 MED ORDER — SORBITOL 70 % SOLN: 30.0000 mL | Freq: Every day | Status: DC | PRN

## 2014-11-27 MED ORDER — ALUM & MAG HYDROXIDE-SIMETH 200-200-20 MG/5ML PO SUSP: 30.0000 mL | ORAL | Status: DC | PRN

## 2014-11-27 MED ORDER — OXYCODONE HCL 5 MG PO TABS
5.0000 mg | ORAL_TABLET | ORAL | Status: DC | PRN
  Administered 2014-11-27 – 2014-11-29 (×2): 5 mg via ORAL
  Administered 2014-12-01 (×2): 10 mg via ORAL
  Administered 2014-12-02: 5 mg via ORAL
  Filled 2014-11-27: qty 2
  Filled 2014-11-27: qty 1
  Filled 2014-11-27: qty 2
  Filled 2014-11-27: qty 1

## 2014-11-27 MED ORDER — LACTATED RINGERS IV SOLN
INTRAVENOUS | Status: DC
Start: 2014-11-27 — End: 2014-12-02
  Administered 2014-11-27 (×2): via INTRAVENOUS

## 2014-11-27 MED ORDER — LABETALOL HCL 5 MG/ML IV SOLN
INTRAVENOUS | Status: DC | PRN
  Administered 2014-11-27: 5 mg via INTRAVENOUS

## 2014-11-27 MED ORDER — SODIUM CHLORIDE 0.9 % IV SOLN
INTRAVENOUS | Status: DC
  Administered 2014-11-27 – 2014-11-29 (×3): via INTRAVENOUS

## 2014-11-27 MED ORDER — MAGNESIUM HYDROXIDE 400 MG/5ML PO SUSP: 30.0000 mL | Freq: Every day | ORAL | Status: DC | PRN

## 2014-11-27 MED ORDER — CEFAZOLIN SODIUM-DEXTROSE 2-3 GM-% IV SOLR
2.0000 g | Freq: Four times a day (QID) | INTRAVENOUS | Status: AC
  Administered 2014-11-27 – 2014-11-28 (×2): 2 g via INTRAVENOUS
  Filled 2014-11-27 (×2): qty 50

## 2014-11-27 MED ORDER — POLYETHYLENE GLYCOL 3350 17 G PO PACK: 17.0000 g | PACK | Freq: Every day | ORAL | Status: DC | PRN

## 2014-11-27 MED ORDER — NEOSTIGMINE METHYLSULFATE 10 MG/10ML IV SOLN
INTRAVENOUS | Status: DC | PRN
  Administered 2014-11-27: 4 mg via INTRAVENOUS

## 2014-11-27 MED ORDER — ACETAMINOPHEN 325 MG PO TABS: 650.0000 mg | ORAL_TABLET | Freq: Four times a day (QID) | ORAL | Status: DC | PRN

## 2014-11-27 MED ORDER — OXYCODONE HCL 5 MG PO TABS: 5.0000 mg | ORAL_TABLET | ORAL | Status: DC | PRN

## 2014-11-27 MED ORDER — ONDANSETRON HCL 4 MG/2ML IJ SOLN
4.0000 mg | Freq: Four times a day (QID) | INTRAMUSCULAR | Status: DC | PRN
  Administered 2014-11-30: 4 mg via INTRAVENOUS
  Filled 2014-11-27: qty 2

## 2014-11-27 MED ORDER — BOOST PLUS PO LIQD
237.0000 mL | Freq: Two times a day (BID) | ORAL | Status: DC
  Administered 2014-11-28 – 2014-12-02 (×5): 237 mL via ORAL
  Filled 2014-11-27 (×12): qty 237

## 2014-11-27 MED ORDER — ALBUMIN HUMAN 5 % IV SOLN
INTRAVENOUS | Status: DC | PRN
  Administered 2014-11-27: 15:00:00 via INTRAVENOUS

## 2014-11-27 MED ORDER — MAGNESIUM CITRATE PO SOLN: 1.0000 | Freq: Once | ORAL | Status: AC | PRN

## 2014-11-27 MED ORDER — ALBUTEROL SULFATE HFA 108 (90 BASE) MCG/ACT IN AERS: 2.0000 | INHALATION_SPRAY | Freq: Four times a day (QID) | RESPIRATORY_TRACT | Status: DC | PRN

## 2014-11-27 MED ORDER — ROCURONIUM BROMIDE 50 MG/5ML IV SOLN
INTRAVENOUS | Status: AC
  Filled 2014-11-27: qty 1

## 2014-11-27 MED ORDER — GLYCOPYRROLATE 0.2 MG/ML IJ SOLN
INTRAMUSCULAR | Status: DC | PRN
  Administered 2014-11-27: 0.6 mg via INTRAVENOUS

## 2014-11-27 MED ORDER — MELOXICAM 7.5 MG PO TABS
7.5000 mg | ORAL_TABLET | Freq: Every day | ORAL | Status: DC
  Administered 2014-11-29 – 2014-12-02 (×4): 7.5 mg via ORAL
  Filled 2014-11-27 (×5): qty 1

## 2014-11-27 MED ORDER — 0.9 % SODIUM CHLORIDE (POUR BTL) OPTIME
TOPICAL | Status: DC | PRN
  Administered 2014-11-27: 1000 mL

## 2014-11-27 MED ORDER — ACETAMINOPHEN 650 MG RE SUPP: 650.0000 mg | Freq: Four times a day (QID) | RECTAL | Status: DC | PRN

## 2014-11-27 MED ORDER — OXYCODONE HCL 5 MG PO TABS
5.0000 mg | ORAL_TABLET | ORAL | Status: DC | PRN
Start: 2014-11-27 — End: 2014-12-04

## 2014-11-27 MED ORDER — PHENOL 1.4 % MT LIQD: 1.0000 | OROMUCOSAL | Status: DC | PRN

## 2014-11-27 MED ORDER — OXYCODONE HCL 5 MG/5ML PO SOLN
ORAL | Status: AC
  Filled 2014-11-27: qty 5

## 2014-11-27 MED ORDER — ENOXAPARIN SODIUM 40 MG/0.4ML ~~LOC~~ SOLN
40.0000 mg | Freq: Every day | SUBCUTANEOUS | Status: DC
  Administered 2014-11-28: 40 mg via SUBCUTANEOUS
  Filled 2014-11-27: qty 0.4

## 2014-11-27 SURGICAL SUPPLY — 59 items
BIPOLAR PROS AML 46 (Hips) ×2 IMPLANT
BIPOLAR PROS AML 46MM (Hips) ×1 IMPLANT
BLADE SAW SGTL 18X1.27X75 (BLADE) ×2 IMPLANT
BLADE SAW SGTL 18X1.27X75MM (BLADE) ×1
BNDG COHESIVE 6X5 TAN STRL LF (GAUZE/BANDAGES/DRESSINGS) ×3 IMPLANT
CELLS DAT CNTRL 66122 CELL SVR (MISCELLANEOUS) ×1 IMPLANT
COVER SURGICAL LIGHT HANDLE (MISCELLANEOUS) ×3 IMPLANT
DRAPE C-ARM 42X72 X-RAY (DRAPES) ×3 IMPLANT
DRAPE IMP U-DRAPE 54X76 (DRAPES) ×3 IMPLANT
DRAPE STERI IOBAN 125X83 (DRAPES) ×3 IMPLANT
DRAPE U-SHAPE 47X51 STRL (DRAPES) ×9 IMPLANT
DRSG AQUACEL AG ADV 3.5X10 (GAUZE/BANDAGES/DRESSINGS) ×3 IMPLANT
DRSG MEPILEX BORDER 4X8 (GAUZE/BANDAGES/DRESSINGS) IMPLANT
DURAPREP 26ML APPLICATOR (WOUND CARE) ×3 IMPLANT
ELECT BLADE 4.0 EZ CLEAN MEGAD (MISCELLANEOUS) ×3
ELECT REM PT RETURN 9FT ADLT (ELECTROSURGICAL) ×3
ELECTRODE BLDE 4.0 EZ CLN MEGD (MISCELLANEOUS) ×1 IMPLANT
ELECTRODE REM PT RTRN 9FT ADLT (ELECTROSURGICAL) ×1 IMPLANT
FACESHIELD STD STERILE (MASK) ×6 IMPLANT
FACESHIELD WRAPAROUND (MASK) ×3 IMPLANT
FACESHIELD WRAPAROUND OR TEAM (MASK) ×1 IMPLANT
GLOVE BIOGEL PI IND STRL 7.0 (GLOVE) IMPLANT
GLOVE BIOGEL PI INDICATOR 7.0 (GLOVE) ×2
GLOVE NEODERM STRL 7.5 LF PF (GLOVE) ×2 IMPLANT
GLOVE SURG NEODERM 7.5  LF PF (GLOVE) ×4
GLOVE SURG SS PI 7.0 STRL IVOR (GLOVE) ×2 IMPLANT
GLOVE SURG SYN 7.5  E (GLOVE) ×2
GLOVE SURG SYN 7.5 E (GLOVE) ×1 IMPLANT
GLOVE SURG SYN 7.5 PF PI (GLOVE) ×1 IMPLANT
GOWN SRG XL XLNG 56XLVL 4 (GOWN DISPOSABLE) ×1 IMPLANT
GOWN STRL NON-REIN XL XLG LVL4 (GOWN DISPOSABLE) ×3
GOWN STRL REUS W/ TWL LRG LVL3 (GOWN DISPOSABLE) IMPLANT
GOWN STRL REUS W/TWL LRG LVL3 (GOWN DISPOSABLE)
HANDPIECE INTERPULSE COAX TIP (DISPOSABLE) ×3
HEAD BIPOLAR PROS AML 46 (Hips) IMPLANT
HEAD FEM STD 28X+1.5 STRL (Hips) ×2 IMPLANT
KIT BASIN OR (CUSTOM PROCEDURE TRAY) ×3 IMPLANT
MARKER SKIN DUAL TIP RULER LAB (MISCELLANEOUS) ×3 IMPLANT
PACK TOTAL JOINT (CUSTOM PROCEDURE TRAY) ×3 IMPLANT
PACK UNIVERSAL I (CUSTOM PROCEDURE TRAY) ×3 IMPLANT
PADDING CAST COTTON 6X4 STRL (CAST SUPPLIES) ×3 IMPLANT
RETRACTOR WND ALEXIS 18 MED (MISCELLANEOUS) ×1 IMPLANT
RTRCTR WOUND ALEXIS 18CM MED (MISCELLANEOUS) ×3
SEALER BIPOLAR AQUA 6.0 (INSTRUMENTS) IMPLANT
SET HNDPC FAN SPRY TIP SCT (DISPOSABLE) ×1 IMPLANT
SOLUTION BETADINE 4OZ (MISCELLANEOUS) ×3 IMPLANT
STEM CORAIL KAR 16 REV HIP (Stem) ×2 IMPLANT
SUT ETHIBOND 2 V 37 (SUTURE) ×3 IMPLANT
SUT ETHIBOND NAB CT1 #1 30IN (SUTURE) ×9 IMPLANT
SUT ETHILON 3 0 FSL (SUTURE) ×3 IMPLANT
SUT VIC AB 0 CT1 27 (SUTURE) ×3
SUT VIC AB 0 CT1 27XBRD ANBCTR (SUTURE) ×1 IMPLANT
SUT VIC AB 1 CT1 27 (SUTURE) ×3
SUT VIC AB 1 CT1 27XBRD ANBCTR (SUTURE) ×1 IMPLANT
SUT VIC AB 2-0 CT1 27 (SUTURE) ×6
SUT VIC AB 2-0 CT1 TAPERPNT 27 (SUTURE) ×2 IMPLANT
SYR 20CC LL (SYRINGE) ×3 IMPLANT
TOWEL OR 17X26 10 PK STRL BLUE (TOWEL DISPOSABLE) ×3 IMPLANT
TRAY FOLEY CATH 16FR SILVER (SET/KITS/TRAYS/PACK) ×3 IMPLANT

## 2014-11-27 NOTE — Consult Note (Signed)
[facility] Consultation  NEVIN KOZUCH ZOX:[id number] DOB: [birth year] DOA: 11/27/2014 PCP: Sande Brothers, MD   Requesting physician: Dr [name].  Date of consultation: 5-11 Reason for consultation:  Management medical problems.   Impression/Recommendations Active Problems:   Periprosthetic fracture around internal prosthetic left hip joint   Hip arthritis    1-Left hip periprosthetic fracture: S/P Revision of left hip arthroplasty of the femoral component. Post operative management per ortho.   2-Alzheimer's dementia; at risk for delirium, limit narcotics as possible.   3-COPD and emphysema Continue with Albuterol PRN>   4-Thyroid mass incidentally noted on CT: Need  repeat thyroid ultrasound in 6 months.   5-Anemia; monitor Hb post op.   6-DVT prophylaxis per Ortho.   7-Leukocytosis; check UA.   I will followup again tomorrow. Please contact me if I can be of assistance in the meanwhile. Thank you for this consultation.  Chief Complaint: frequent fall, left hip pain.   HPI: 81 year old with PMH significant for Thryroid mass, COPD emphysema, dementia, LBBB, recent hip fracture S/P repair 2 week ago. She followed up with Dr [name] post surgery and was complaining of left hip pain, unable to put weight on left leg. She has fallen again at the facility. She was found to have periprosthetic fracture with subsidence of the femoral stem. She was admitted on  5-11 for Revision of left hip arthroplasty of the femoral component. We were consulted to help with medical management. I saw patient in PACU. She is lethargic but open eyes, follow some command, say few words. Unable to obtain history from patient due to patient been sedated.   Review of Systems:  Unable to obtain due to AMS>   Past Medical History  Diagnosis Date  . Hypertension   . Arthritis   . Alzheimer's dementia   . Anxiety   . Depression   . COPD (chronic obstructive pulmonary disease)   . Presence of  permanent cardiac pacemaker     LOOP RECORDER DEVICE   Past Surgical History  Procedure Laterality Date  . Shoulder surgery      Left   . Loop recorder implant N/A 06/22/2012    Procedure: LOOP RECORDER IMPLANT;  Surgeon: Sanda Klein, MD;  Location: [facility]ATH [facility];  Service: Cardiovascular;  Laterality: N/A;  . Total hip arthroplasty Left 11/07/2014    Procedure: Left hip anterior hemiarthroplasty;  Surgeon: Leandrew Koyanagi, MD;  Location: [facility];  Service: Orthopedics;  Laterality: Left;   Social History:  reports that she has quit smoking. She has never used smokeless tobacco. She reports that she does not drink alcohol or use illicit drugs.  Allergies  Allergen Reactions  . Codeine     Makes her ill  . Vicodin [Hydrocodone-Acetaminophen]     Listed on nursing home MAR   Family History: unable to Obtain from patient due to AMS.   Prior to Admission medications   Medication Sig Start Date End Date Taking? Authorizing Provider  acetaminophen (MAPAP) 500 MG tablet Take 500 mg by mouth every 6 (six) hours as needed for mild pain, fever or headache.    Yes Historical Provider, MD  albuterol (PROVENTIL HFA;VENTOLIN HFA) 108 (90 BASE) MCG/ACT inhaler Inhale 2 puffs into the lungs every 6 (six) hours as needed for wheezing or shortness of breath. 10/09/14  Yes Nishant Dhungel, MD  albuterol (PROVENTIL) (2.5 MG/3ML) 0.083% nebulizer solution Take 2.5 mg by nebulization every 6 (six) hours as needed for wheezing or shortness of breath.  Yes Historical Provider, MD  cholecalciferol (VITAMIN D) 1000 UNITS tablet Take 1,000 Units by mouth daily with breakfast.    Yes Historical Provider, MD  enoxaparin (LOVENOX) 40 MG/0.4ML injection Inject 0.4 mLs (40 mg total) into the skin daily. 11/07/14  Yes Naiping Ephriam Jenkins, MD  meloxicam (MOBIC) 7.5 MG tablet Take 7.5 mg by mouth daily with breakfast.   Yes Historical Provider, MD  Multiple Vitamins-Minerals (MULTIVITAMIN) tablet Take 1 tablet by mouth daily. 10/25/14   Yes Barton Dubois, MD  PARoxetine (PAXIL) 40 MG tablet Take 40 mg by mouth every morning.   Yes Historical Provider, MD  polyethylene glycol (MIRALAX / GLYCOLAX) packet Take 17 g by mouth daily as needed for mild constipation.    Yes Historical Provider, MD  senna-docusate (SENOKOT-S) 8.6-50 MG per tablet Take 1 tablet by mouth at bedtime. 11/11/14  Yes Eugenie Filler, MD  temazepam (RESTORIL) 7.5 MG capsule Take 1 capsule (7.5 mg total) by mouth at bedtime. 10/25/14  Yes Barton Dubois, MD  traMADol Veatrice Bourbon) 50 MG tablet Take one to two tablets by mouth every 6 hours as needed for breakthrough pain 11/18/14  Yes Tiffany L Reed, DO  alum & mag hydroxide-simeth (MI-ACID MAXIMUM STRENGTH) 400-400-40 MG/5ML suspension Take 30 mLs by mouth every 6 (six) hours as needed for indigestion.     Historical Provider, MD  Carbomer Gel Base (HYDROGEL) GEL Apply 1 application topically daily as needed (for rash). Apply to face    Historical Provider, MD  enoxaparin (LOVENOX) 40 MG/0.4ML injection Inject 0.4 mLs (40 mg total) into the skin daily. 11/27/14   Naiping Ephriam Jenkins, MD  guaifenesin (Q-TUSSIN) 100 MG/5ML syrup Take 200 mg by mouth every 6 (six) hours as needed for cough.     Historical Provider, MD  lactose free nutrition (BOOST PLUS) LIQD Take 237 mLs by mouth 2 (two) times daily between meals. 11/11/14   Eugenie Filler, MD  loperamide (IMODIUM) 2 MG capsule Take 2 mg by mouth as needed for diarrhea or loose stools. NOT TO EXCEED 8 DOSES IN 24 HR    Historical Provider, MD  magnesium hydroxide (MILK OF MAGNESIA) 400 MG/5ML suspension Take 30 mLs by mouth daily as needed for mild constipation.     Historical Provider, MD  Naphazoline-Glycerin-Zinc Sulf (CLEAR EYES MAXIMUM ITCHY EYE OP) Place 1-2 drops into both eyes 3 (three) times daily as needed (for itching and redness). Wait 3 to 5 minutes between each drop    Historical Provider, MD  Neomycin-Bacitracin-Polymyxin (HCA TRIPLE ANTIBIOTIC OINTMENT EX) Apply 1  application topically as needed (for wound care).     Historical Provider, MD  oxyCODONE (OXY IR/ROXICODONE) 5 MG immediate release tablet Take 1-3 tablets (5-15 mg total) by mouth every 4 (four) hours as needed. Patient not taking: Reported on 11/26/2014 11/07/14   Leandrew Koyanagi, MD  oxyCODONE (OXY IR/ROXICODONE) 5 MG immediate release tablet Take 1-3 tablets (5-15 mg total) by mouth every 4 (four) hours as needed. 11/27/14   Leandrew Koyanagi, MD   Physical Exam: Blood pressure 145/68, pulse 109, temperature 97.7 F (36.5 C), temperature source Oral, resp. rate 16, height 5\' 5"  (1.651 m), weight 73.483 kg (162 lb), SpO2 95 %. Filed Vitals:   11/27/14 1053  BP: 145/68  Pulse: 109  Temp: 97.7 F (36.5 C)  Resp: 16   General:  Appears calm and comfortable, lethargic, open eyes, follow some command.  seen in PACU,  Eyes: PERRL, normal lids, irises & conjunctiva ENT:  grossly normal hearing, lips & tongue Neck: no LAD, masses or thyromegaly Cardiovascular: RRR, no m/r/g. No LE edema. Telemetry: SR, no arrhythmias  Respiratory: CTA bilaterally, no w/r/r. Normal respiratory effort. Abdomen: soft, ntnd Skin: no rash or induration seen on limited exam Musculoskeletal: grossly normal tone BUE/BLE Neurologic: lethargic, post anesthesia, open eyes, follow some command, say few words, "oh lord"' move upper extremities.     Labs on Admission:  Basic Metabolic Panel:  Recent Labs Lab 11/27/14 1108  NA 141  K 4.7  CL 107  CO2 27  GLUCOSE 94  BUN 23*  CREATININE 1.13*  CALCIUM 9.0   Liver Function Tests:  Recent Labs Lab 11/27/14 1108  AST 24  ALT 17  ALKPHOS 153*  BILITOT 0.6  PROT 5.6*  ALBUMIN 2.9*   No results for input(s): LIPASE, AMYLASE in the last 168 hours. No results for input(s): AMMONIA in the last 168 hours. CBC:  Recent Labs Lab 11/27/14 1108  WBC 13.0*  NEUTROABS 10.7*  HGB 10.5*  HCT 35.1*  MCV 90.5  PLT 468*   Cardiac Enzymes: No results for input(s):  CKTOTAL, CKMB, CKMBINDEX, TROPONINI in the last 168 hours. BNP: Invalid input(s): POCBNP CBG: No results for input(s): GLUCAP in the last 168 hours.  Radiological Exams on Admission: Dg Hip Operative Unilat With Pelvis Left  11/27/2014   CLINICAL DATA:  Revision arthroplasty of the left hip. Intraoperative imaging.  EXAM: OPERATIVE LEFT HIP (WITH PELVIS IF PERFORMED) 1 VIEW  TECHNIQUE: Fluoroscopic spot image(s) were submitted for interpretation post-operatively.  FLUOROSCOPY TIME:  Radiation Exposure Index (as provided by the fluoroscopic device):  If the device does not provide the exposure index:  Fluoroscopy Time:  0 minutes and 13 seconds  Number of Acquired Images:  1  COMPARISON:  11/07/2014  FINDINGS: The arthroplasty appears well seated and aligned on this single view. There is no acute fracture or evidence of an operative complication.  IMPRESSION: Well aligned left hip hemiarthroplasty.   Electronically Signed   By: Lajean Manes M.D.   On: 11/27/2014 15:01    EKG; none available.    Time spent: 75 minutes.   Niel Hummer A [facility] Pager [phone]  If 7PM-7AM, please contact night-coverage [email or web address] Password [facility] 11/27/2014, 3:45 PM

## 2014-11-27 NOTE — Anesthesia Postprocedure Evaluation (Signed)
Anesthesia Post Note  Patient: Laura [name]  Procedure(s) Performed: Procedure(s) (LRB): REVISION OF LEFT HIP REPLACEMENT (Left)  Anesthesia type: general  Patient location: PACU  Post pain: Pain level controlled  Post assessment: Patient's Cardiovascular Status Stable  Last Vitals:  Filed Vitals:   11/27/14 1700  BP: 138/54  Pulse: 69  Temp: 36.5 C  Resp: 9    Post vital signs: Reviewed and stable  Level of consciousness: sedated  Complications: No apparent anesthesia complications

## 2014-11-27 NOTE — Anesthesia Procedure Notes (Signed)
Procedure Name: Intubation Date/Time: 11/27/2014 1:24 PM Performed by: Williemae Area B Pre-anesthesia Checklist: Patient identified, Emergency Drugs available, Suction available and Patient being monitored Patient Re-evaluated:Patient Re-evaluated prior to inductionOxygen Delivery Method: Circle system utilized Preoxygenation: Pre-oxygenation with 100% oxygen Intubation Type: IV induction Ventilation: Mask ventilation without difficulty Laryngoscope Size: Mac and 3 Grade View: Grade II Tube type: Oral Tube size: 7.5 mm Number of attempts: 1 Airway Equipment and Method: Stylet Placement Confirmation: ETT inserted through vocal cords under direct vision,  breath sounds checked- equal and bilateral and positive ETCO2 Secured at: 21 (cm at teeth) cm Tube secured with: Tape Dental Injury: Teeth and Oropharynx as per pre-operative assessment

## 2014-11-27 NOTE — Anesthesia Preprocedure Evaluation (Addendum)
Anesthesia Evaluation  Patient identified by MRN, date of birth, ID band Patient awake    Reviewed: Allergy & Precautions, NPO status , Patient's Chart, lab work & pertinent test results  Airway Mallampati: II  TM Distance: >3 FB Neck ROM: Full    Dental  (+) Teeth Intact, Dental Advisory Given   Pulmonary shortness of breath and Long-Term Oxygen Therapy, COPD oxygen dependent, former smoker,  Only since recent UTI breath sounds clear to auscultation        Cardiovascular hypertension, + CAD + dysrhythmias + pacemaker Rhythm:Regular Rate:Normal     Neuro/Psych    GI/Hepatic   Endo/Other    Renal/GU Renal disease     Musculoskeletal  (+) Arthritis -, Osteoarthritis,    Abdominal   Peds  Hematology   Anesthesia Other Findings Caretaker with pt.  Pt has a very loose productive cough.  Pt used bedpan in HA to void; urine very intensely musty smelling.  Reproductive/Obstetrics                           Anesthesia Physical Anesthesia Plan  ASA: III  Anesthesia Plan: General   Post-op Pain Management:    Induction: Intravenous  Airway Management Planned: Oral ETT  Additional Equipment:   Intra-op Plan:   Post-operative Plan: Extubation in OR  Informed Consent: I have reviewed the patients History and Physical, chart, labs and discussed the procedure including the risks, benefits and alternatives for the proposed anesthesia with the patient or authorized representative who has indicated his/her understanding and acceptance.   Dental advisory given  Plan Discussed with: CRNA and Anesthesiologist  Anesthesia Plan Comments:         Anesthesia Quick Evaluation

## 2014-11-27 NOTE — Op Note (Signed)
Date of surgery: 11/27/2014  Preoperative diagnosis: Left hip periprosthetic fracture  Postoperative diagnosis: Same  Procedure: Revision of left hip arthroplasty of the femoral component  Drains: 1 HVAC  Surgeon: Eduard Roux, M.D.  Assistant: Ky Barban, RNFA  Anesthesia: General  Estimated blood loss: See anesthesia record  Complications: None  Indications for procedure: The patient is 81 year old female who sustained a periprosthetic hip fracture as a result of multiple falls at her nursing facility. She had just undergone hip hemiarthroplasty approximately 2-3 weeks ago and was discharged from the hospital in stable condition. She followed up to my office for first postoperative visit with pain and inability to weight-bear status post multiple falls at her facility. X-rays in my office showed a periprosthetic fracture with subsidence of the femoral stem. The recommendation was for revision. Her healthcare power of attorney was made aware of the risks, benefits, and alternatives to surgery and he wished to proceed.  Description of procedure: The patient was identified in the preoperative holding area. Operative site was marked by the surgeon confirmed with the healthcare power of attorney. She is brought back to the operating room. She was placed supine on the table. General anesthesia was induced. She was placed on the Hana table. The left lower extremity was prepped and draped in standard sterile fashion. A standard Huter approach to the hip was again used. Dissection was carried down to the hip capsule. A capsulectomy was performed to expose the joint. There we visualized subsidence of the femoral stem. We then dislocated the implant and removed the bipolar head. We then placed the femur hook under the proximal femur and externally rotated her leg 130.  The leg was then extended and abducted underneath the contralateral leg to expose the proximal femur. We then placed retractors around  the proximal femur and we were able to extract the old stem. After extraction of the old stem we were able to visualize the proximal fracture. Essentially she had broken the posterior half of her proximal femur below the lesser trochanter. Femoral canal remained intact. We felt that we could revise her stem with a distally fixing press-fit stem. We hand reamed the canal down to the appropriate depth and size. We then sequentially broached up to a 16 stem. We then trialed this stem and exhibited restoration of her leg length and offset. We then dislocated her hip once more to remove the trial components. The wound was thoroughly irrigated including the femoral canal and we placed the final implant. The trunnion was cleaned off and we placed a bipolar head back on. We then again reduced the hip and final x-rays were taken to confirm appropriate positioning of the implants. Her hip was stable at 45 of extension and 90 of external rotation. The wound was then thoroughly irrigated and a medium Hemovac was placed deep to the fascia. The fascia was closed with #1 Vicryl. The subcutaneous fat was closed with 0 Vicryl. The subcutaneous skin was closed with 2-0 Vicryl and the skin was closed with staples. Sterile dressings were applied. The patient was extubated and transferred to the PACU in stable condition. All sponge counts were correct.  Postoperative plan: The patient will be weight bear as tolerated to the left lower extremity. She will have no hip precautions. She will be up with physical therapy. We will obtain a hospitalist consult for comanagement and we will discharge her back to her skilled nursing facility when appropriate.  Azucena Cecil, MD [place] 3:18 PM

## 2014-11-27 NOTE — H&P (Signed)
PREOPERATIVE H&P  Chief Complaint: Left hip periprosthetic fracture  HPI: Laura [name] is a 81 y.o. female who presents for surgical treatment of Left hip periprosthetic fracture.  She denies any changes in medical history.  Past Medical History  Diagnosis Date  . Hypertension   . Arthritis   . Alzheimer's dementia   . Anxiety   . Depression   . COPD (chronic obstructive pulmonary disease)   . Presence of permanent cardiac pacemaker     LOOP RECORDER DEVICE   Past Surgical History  Procedure Laterality Date  . Shoulder surgery      Left   . Loop recorder implant N/A 06/22/2012    Procedure: LOOP RECORDER IMPLANT;  Surgeon: Sanda Klein, MD;  Location: [facility] CATH [facility];  Service: Cardiovascular;  Laterality: N/A;  . Total hip arthroplasty Left 11/07/2014    Procedure: Left hip anterior hemiarthroplasty;  Surgeon: Leandrew Koyanagi, MD;  Location: [facility];  Service: Orthopedics;  Laterality: Left;   History   Social History  . Marital Status: Divorced    Spouse Name: N/A  . Number of Children: N/A  . Years of Education: N/A   Social History Main Topics  . Smoking status: Former Research scientist (life sciences)  . Smokeless tobacco: Never Used  . Alcohol Use: No  . Drug Use: No  . Sexual Activity: No   Other Topics Concern  . None   Social History Narrative   History reviewed. No pertinent family history. Allergies  Allergen Reactions  . Codeine     Makes her ill  . Vicodin [Hydrocodone-Acetaminophen]     Listed on nursing home [facility]   Prior to Admission medications   Medication Sig Start Date End Date Taking? Authorizing Provider  acetaminophen (MAPAP) 500 MG tablet Take 500 mg by mouth every 6 (six) hours as needed for mild pain, fever or headache.    Yes Historical Provider, MD  albuterol (PROVENTIL HFA;VENTOLIN HFA) 108 (90 BASE) MCG/ACT inhaler Inhale 2 puffs into the lungs every 6 (six) hours as needed for wheezing or shortness of breath. 10/09/14  Yes Nishant Dhungel, MD  albuterol  (PROVENTIL) (2.5 MG/3ML) 0.083% nebulizer solution Take 2.5 mg by nebulization every 6 (six) hours as needed for wheezing or shortness of breath.   Yes Historical Provider, MD  alum & mag hydroxide-simeth (MI-ACID MAXIMUM STRENGTH) 400-400-40 MG/5ML suspension Take 30 mLs by mouth every 6 (six) hours as needed for indigestion.    Yes Historical Provider, MD  Carbomer Gel Base (HYDROGEL) GEL Apply 1 application topically daily as needed (for rash). Apply to face   Yes Historical Provider, MD  cholecalciferol (VITAMIN D) 1000 UNITS tablet Take 1,000 Units by mouth daily with breakfast.    Yes Historical Provider, MD  enoxaparin (LOVENOX) 40 MG/0.4ML injection Inject 0.4 mLs (40 mg total) into the skin daily. 11/07/14  Yes Leandrew Koyanagi, MD  guaifenesin (Q-TUSSIN) 100 MG/5ML syrup Take 200 mg by mouth every 6 (six) hours as needed for cough.    Yes Historical Provider, MD  lactose free nutrition (BOOST PLUS) LIQD Take 237 mLs by mouth 2 (two) times daily between meals. 11/11/14  Yes Eugenie Filler, MD  loperamide (IMODIUM) 2 MG capsule Take 2 mg by mouth as needed for diarrhea or loose stools. NOT TO EXCEED 8 DOSES IN 24 HR   Yes Historical Provider, MD  magnesium hydroxide (MILK OF MAGNESIA) 400 MG/5ML suspension Take 30 mLs by mouth daily as needed for mild constipation.    Yes Historical  Provider, MD  meloxicam (MOBIC) 7.5 MG tablet Take 7.5 mg by mouth daily with breakfast.   Yes Historical Provider, MD  Multiple Vitamins-Minerals (MULTIVITAMIN) tablet Take 1 tablet by mouth daily. 10/25/14  Yes Barton Dubois, MD  Naphazoline-Glycerin-Zinc Sulf (CLEAR EYES MAXIMUM ITCHY EYE OP) Place 1-2 drops into both eyes 3 (three) times daily as needed (for itching and redness). Wait 3 to 5 minutes between each drop   Yes Historical Provider, MD  Neomycin-Bacitracin-Polymyxin (HCA TRIPLE ANTIBIOTIC OINTMENT EX) Apply 1 application topically as needed (for wound care).    Yes Historical Provider, MD  PARoxetine  (PAXIL) 40 MG tablet Take 40 mg by mouth every morning.   Yes Historical Provider, MD  polyethylene glycol (MIRALAX / GLYCOLAX) packet Take 17 g by mouth daily as needed for mild constipation.    Yes Historical Provider, MD  senna-docusate (SENOKOT-S) 8.6-50 MG per tablet Take 1 tablet by mouth at bedtime. 11/11/14  Yes Eugenie Filler, MD  temazepam (RESTORIL) 7.5 MG capsule Take 1 capsule (7.5 mg total) by mouth at bedtime. 10/25/14  Yes Barton Dubois, MD  traMADol Veatrice Bourbon) 50 MG tablet Take one to two tablets by mouth every 6 hours as needed for breakthrough pain 11/18/14  Yes Tiffany L Reed, DO  oxyCODONE (OXY IR/ROXICODONE) 5 MG immediate release tablet Take 1-3 tablets (5-15 mg total) by mouth every 4 (four) hours as needed. Patient not taking: Reported on 11/26/2014 11/07/14   Leandrew Koyanagi, MD     Positive ROS: All other systems have been reviewed and were otherwise negative with the exception of those mentioned in the HPI and as above.  Physical Exam: General: Alert, no acute distress Cardiovascular: No pedal edema Respiratory: No cyanosis, no use of accessory musculature GI: abdomen soft Skin: No lesions in the area of chief complaint Neurologic: Sensation intact distally Psychiatric: Patient is competent for consent with normal mood and affect Lymphatic: no lymphedema  MUSCULOSKELETAL: exam stable  Assessment: Left hip periprosthetic fracture  Plan: Plan for Procedure(s): REVISION OF LEFT HIP REPLACEMENT  The risks benefits and alternatives were discussed with the patient including but not limited to the risks of nonoperative treatment, versus surgical intervention including infection, bleeding, nerve injury,  blood clots, cardiopulmonary complications, morbidity, mortality, among others, and they were willing to proceed.   Marianna Payment, MD   11/27/2014 7:18 AM

## 2014-11-27 NOTE — Discharge Instructions (Signed)

## 2014-11-27 NOTE — Transfer of Care (Signed)
Immediate Anesthesia Transfer of Care Note  Patient: Laura [name]  Procedure(s) Performed: Procedure(s): REVISION OF LEFT HIP REPLACEMENT (Left)  Patient Location: PACU  Anesthesia Type:General  Level of Consciousness: awake and patient cooperative  Airway & Oxygen Therapy: Patient Spontanous Breathing and Patient connected to nasal cannula oxygen  Post-op Assessment: Report given to RN, Post -op Vital signs reviewed and stable and Patient moving all extremities  Post vital signs: Reviewed and stable  Last Vitals:  Filed Vitals:   11/27/14 1053  BP: 145/68  Pulse: 109  Temp: 36.5 C  Resp: 16    Complications: No apparent anesthesia complications

## 2014-11-28 DIAGNOSIS — Z96642 Presence of left artificial hip joint: Secondary | ICD-10-CM

## 2014-11-28 DIAGNOSIS — N390 Urinary tract infection, site not specified: Secondary | ICD-10-CM

## 2014-11-28 DIAGNOSIS — R319 Hematuria, unspecified: Secondary | ICD-10-CM

## 2014-11-28 DIAGNOSIS — D649 Anemia, unspecified: Secondary | ICD-10-CM

## 2014-11-28 DIAGNOSIS — T84041A Periprosthetic fracture around internal prosthetic left hip joint, initial encounter: Principal | ICD-10-CM

## 2014-11-28 LAB — BASIC METABOLIC PANEL
Anion gap: 6 (ref 5–15)
BUN: 27 mg/dL — ABNORMAL HIGH (ref 6–20)
CO2: 26 mmol/L (ref 22–32)
Calcium: 8 mg/dL — ABNORMAL LOW (ref 8.9–10.3)
Chloride: 108 mmol/L (ref 101–111)
Creatinine, Ser: 1.35 mg/dL — ABNORMAL HIGH (ref 0.44–1.00)
GFR, EST AFRICAN AMERICAN: 39 mL/min — AB (ref >60)
GFR, EST NON AFRICAN AMERICAN: 34 mL/min — AB (ref >60)
Glucose, Bld: 116 mg/dL — ABNORMAL HIGH (ref 65–99)
POTASSIUM: 5.1 mmol/L (ref 3.5–5.1)
Sodium: 140 mmol/L (ref 135–145)

## 2014-11-28 LAB — CBC
HCT: 24.4 % — ABNORMAL LOW (ref 36.0–46.0)
Hemoglobin: 7.4 g/dL — ABNORMAL LOW (ref 12.0–15.0)
MCH: 27.5 pg (ref 26.0–34.0)
MCHC: 30.3 g/dL (ref 30.0–36.0)
MCV: 90.7 fL (ref 78.0–100.0)
PLATELETS: 429 10*3/uL — AB (ref 150–400)
RBC: 2.69 MIL/uL — ABNORMAL LOW (ref 3.87–5.11)
RDW: 14.9 % (ref 11.5–15.5)
WBC: 12.9 10*3/uL — ABNORMAL HIGH (ref 4.0–10.5)

## 2014-11-28 LAB — PREPARE RBC (CROSSMATCH)

## 2014-11-28 MED ORDER — PANTOPRAZOLE SODIUM 40 MG PO TBEC
40.0000 mg | DELAYED_RELEASE_TABLET | Freq: Every day | ORAL | Status: DC
  Administered 2014-11-28 – 2014-12-02 (×5): 40 mg via ORAL
  Filled 2014-11-28 (×5): qty 1

## 2014-11-28 MED ORDER — SODIUM CHLORIDE 0.9 % IV SOLN: Freq: Once | INTRAVENOUS | Status: DC

## 2014-11-28 MED ORDER — POLYETHYLENE GLYCOL 3350 17 G PO PACK
17.0000 g | PACK | Freq: Once | ORAL | Status: AC
  Administered 2014-11-28: 17 g via ORAL
  Filled 2014-11-28: qty 1

## 2014-11-28 MED ORDER — CEFTRIAXONE SODIUM IN DEXTROSE 20 MG/ML IV SOLN
1.0000 g | Freq: Every day | INTRAVENOUS | Status: DC
  Administered 2014-11-28 – 2014-11-30 (×3): 1 g via INTRAVENOUS
  Filled 2014-11-28 (×3): qty 50

## 2014-11-28 MED ORDER — ENOXAPARIN SODIUM 40 MG/0.4ML ~~LOC~~ SOLN
30.0000 mg | Freq: Every day | SUBCUTANEOUS | Status: DC
  Administered 2014-11-29 – 2014-12-01 (×3): 30 mg via SUBCUTANEOUS
  Filled 2014-11-28 (×3): qty 0.4

## 2014-11-28 MED ORDER — SODIUM CHLORIDE 0.9 % IV SOLN
Freq: Once | INTRAVENOUS | Status: AC
  Administered 2014-11-28: 13:00:00 via INTRAVENOUS

## 2014-11-28 MED ORDER — BISACODYL 10 MG RE SUPP
10.0000 mg | Freq: Once | RECTAL | Status: AC
  Administered 2014-11-28: 10 mg via RECTAL
  Filled 2014-11-28: qty 1

## 2014-11-28 NOTE — Progress Notes (Signed)
Patient Demographics  Laura [name], is a 81 y.o. female, DOB - 1924/03/14, VQM:[id number]  Admit date - 11/27/2014   Admitting Physician Naiping Ephriam Jenkins, MD  Outpatient Primary MD for the patient is Sande Brothers, MD  LOS - 1   No chief complaint on file.                                                            consult follow-up note  Admission HPI/Brief narrative: 81 year old with PMH significant for Thryroid mass, COPD emphysema, dementia, LBBB, recent hip fracture S/P repair 2 week ag was admitted on 5-11 for Revision of left hip arthroplasty of the femoral component. Medicine consulted to help with medical management.    Subjective:   [place] today has, No headache, No chest pain, No abdominal pain - No Nausea, No new weakness tingling or numbness, No Cough - SOB. She reports constipation  Assessment & Plan    Active Problems:   Periprosthetic fracture around internal prosthetic left hip joint   Hip arthritis  Left hip periprosthetic fracture:  - S/P Revision of left hip arthroplasty of the femoral component. Post operative management per ortho.  - Will discontinue Toradol IV every 6 hours for pain as patient is already on Mobic.  Alzheimer's dementia;  - at risk for delirium, limit narcotics as possible.   UTI - Started on Rocephin, follow on urine culture.  COPD and emphysema - No active wheezing Continue with Albuterol PRN>   Thyroid mass incidentally noted on CT:  - Need repeat thyroid ultrasound in 6 months.   Anemia;  - Hemoglobin is 7.4 - Will transfuse 1 unit packed red blood cell, spoke and discussed with her HCPOA Mr Claybon Jabs DVT prophylaxis per Ortho.    Code Status: Full  Family Communication: None at bedside     Procedures  Revision of left hip arthroplasty of the femoral component     Medications  Scheduled Meds: . sodium chloride    Intravenous Once  . acetaminophen  1,000 mg Oral 4 times per day  . cefTRIAXone (ROCEPHIN)  IV  1 g Intravenous Daily  . cholecalciferol  1,000 Units Oral Q breakfast  . enoxaparin  40 mg Subcutaneous Daily  . ketorolac  7.5 mg Intravenous 4 times per day  . lactose free nutrition  237 mL Oral BID BM  . [START ON 11/29/2014] meloxicam  7.5 mg Oral Q breakfast  . PARoxetine  40 mg Oral Daily  . senna-docusate  1 tablet Oral QHS  . temazepam  7.5 mg Oral QHS   Continuous Infusions: . sodium chloride 100 mL/hr at 11/27/14 1840  . lactated ringers 10 mL/hr at 11/27/14 1111   PRN Meds:.acetaminophen **OR** acetaminophen, albuterol, alum & mag hydroxide-simeth, baclofen, diphenhydrAMINE, guaifenesin, loperamide, magnesium hydroxide, menthol-cetylpyridinium **OR** phenol, metoCLOPramide **OR** metoCLOPramide (REGLAN) injection, morphine injection, ondansetron **OR** ondansetron (ZOFRAN) IV, oxyCODONE, polyethylene glycol, sorbitol  DVT Prophylaxis  Lovenox -  Lab Results  Component Value Date   PLT 429* 11/28/2014    Antibiotics   Anti-infectives  Start     Dose/Rate Route Frequency Ordered Stop   11/28/14 0800  cefTRIAXone (ROCEPHIN) 1 g in dextrose 5 % 50 mL IVPB - Premix     1 g 100 mL/hr over 30 Minutes Intravenous Daily 11/28/14 0708     11/27/14 1930  ceFAZolin (ANCEF) IVPB 2 g/50 mL premix     2 g 100 mL/hr over 30 Minutes Intravenous Every 6 hours 11/27/14 1806 11/28/14 0337   11/27/14 1100  ceFAZolin (ANCEF) IVPB 2 g/50 mL premix     2 g 100 mL/hr over 30 Minutes Intravenous To Surgery 11/26/14 1408 11/27/14 1327          Objective:   Filed Vitals:   11/27/14 1800 11/27/14 1828 11/27/14 2125 11/28/14 0455  BP:  97/66 104/48 94/42  Pulse: 77 77 85 108  Temp:  98.5 F (36.9 C) 98.4 F (36.9 C) 99.1 F (37.3 C)  TempSrc:  Oral Oral Oral  Resp: 14 14 17 15   Height:      Weight:      SpO2: 98%  95% 93%    Wt Readings from Last 3 Encounters:  11/27/14 73.483  kg (162 lb)  11/13/14 73.573 kg (162 lb 3.2 oz)  10/25/14 74.9 kg (165 lb 2 oz)     Intake/Output Summary (Last 24 hours) at 11/28/14 1109 Last data filed at 11/28/14 4854  Gross per 24 hour  Intake   3270 ml  Output    600 ml  Net   2670 ml     Physical Exam  Awake Alert, pleasant, No new F.N deficits, Normal affect [place].AT,PERRAL Supple Neck,No JVD, No cervical lymphadenopathy appriciated.  Symmetrical Chest wall movement, Good air movement bilaterally, CTAB RRR,No Gallops,Rubs or new Murmurs, No Parasternal Heave +ve B.Sounds, Abd Soft, No tenderness, No organomegaly appriciated, No rebound - guarding or rigidity. No Cyanosis, Clubbing or edema, pulses felt bilaterally.   Data Review   Micro Results No results found for this or any previous visit (from the past 240 hour(s)).  Radiology Reports Dg Chest 2 View  11/05/2014   CLINICAL DATA:  [place] at nursing home, shortness of breath, preoperative assessment  EXAM: CHEST  2 VIEW  COMPARISON:  10/07/2014  FINDINGS: Loop recorder projects over LEFT chest.  Enlargement of cardiac silhouette with pulmonary vascular congestion.  Atherosclerotic calcification aorta.  Emphysematous and bronchitic changes consistent with COPD.  Chronic accentuation of perihilar markings unchanged.  No gross infiltrate, pleural effusion or pneumothorax.  Calcified BILATERAL breast prostheses.  Marked osseous demineralization with multiple prior spinal augmentation procedures.  LEFT shoulder prosthesis.  IMPRESSION: COPD changes.  No acute abnormalities.   Electronically Signed   By: Lavonia Dana M.D.   On: 11/05/2014 08:48   Dg Knee 2 Views Left  11/05/2014   CLINICAL DATA:  Left leg foreshortened.  Left knee pain.  Fall.  EXAM: LEFT KNEE - 1-2 VIEW  COMPARISON:  None.  FINDINGS: Degenerative changes in the left knee with joint space narrowing, spurring, most pronounced in the lateral compartment. No fracture, subluxation or dislocation. Diffuse osteopenia.  Soft tissues are intact and unremarkable.  IMPRESSION: Tricompartment degenerative changes.  No acute bony abnormality.   Electronically Signed   By: Rolm Baptise M.D.   On: 11/05/2014 08:49   Ct Head Wo Contrast  11/08/2014   CLINICAL DATA:  Dementia  EXAM: CT HEAD WITHOUT CONTRAST  TECHNIQUE: Contiguous axial images were obtained from the base of the skull through the vertex without intravenous contrast.  COMPARISON:  4/19/ 16  FINDINGS: No skull fracture is noted. Paranasal sinuses and mastoid air cells are unremarkable. Stable atrophy. Stable periventricular and patchy subcortical chronic white matter disease. Atherosclerotic calcifications of carotid siphon again noted. No acute cortical infarction. No mass lesion is noted on this unenhanced scan. The gray and white-matter differentiation is preserved.  IMPRESSION: No acute intracranial abnormality. Stable atrophy and chronic white matter disease.   Electronically Signed   By: Lahoma Crocker M.D.   On: 11/08/2014 14:06   Ct Head Wo Contrast  11/05/2014   CLINICAL DATA:  Fall, found on floor covered in urine at 0615 hours today dementia, LEFT hip pain, hypertension, Alzheimer's  EXAM: CT HEAD WITHOUT CONTRAST  CT CERVICAL SPINE WITHOUT CONTRAST  TECHNIQUE: Multidetector CT imaging of the head and cervical spine was performed following the standard protocol without intravenous contrast. Multiplanar CT image reconstructions of the cervical spine were also generated.  COMPARISON:  10/07/2014  FINDINGS: CT HEAD FINDINGS  Generalized atrophy.  Normal ventricular morphology.  No midline shift or mass effect.  Small vessel chronic ischemic changes of deep cerebral white matter.  No intracranial hemorrhage, mass lesion, or acute infarction.  Visualized paranasal sinuses and mastoid air cells clear.  Bones unremarkable.  CT CERVICAL SPINE FINDINGS  Beam hardening artifacts of dental origin and from LEFT shoulder prosthesis.  RIGHT thyroid mass 4.1 x 4.1 x 2.5 cm image  63.  Visualized skullbase intact.  Diffuse osseous demineralization.  Prevertebral soft tissues normal thickness.  Disc space narrowing with endplate spur formation at C5-C6.  Multilevel facet degenerative changes.  Vertebral body heights maintained without fracture or subluxation.  No bone destruction.  Lung apices clear.  IMPRESSION: Atrophy with small vessel chronic ischemic changes of deep cerebral white matter.  No acute intracranial abnormalities.  Degenerative disc and facet disease changes cervical spine.  No acute cervical spine abnormalities.  RIGHT thyroid mass 4.1 x 4.1 x 2.5 cm; dedicated thyroid ultrasound followup recommended.   Electronically Signed   By: Lavonia Dana M.D.   On: 11/05/2014 10:46   Ct Cervical Spine Wo Contrast  11/05/2014   CLINICAL DATA:  Fall, found on floor covered in urine at 0615 hours today dementia, LEFT hip pain, hypertension, Alzheimer's  EXAM: CT HEAD WITHOUT CONTRAST  CT CERVICAL SPINE WITHOUT CONTRAST  TECHNIQUE: Multidetector CT imaging of the head and cervical spine was performed following the standard protocol without intravenous contrast. Multiplanar CT image reconstructions of the cervical spine were also generated.  COMPARISON:  10/07/2014  FINDINGS: CT HEAD FINDINGS  Generalized atrophy.  Normal ventricular morphology.  No midline shift or mass effect.  Small vessel chronic ischemic changes of deep cerebral white matter.  No intracranial hemorrhage, mass lesion, or acute infarction.  Visualized paranasal sinuses and mastoid air cells clear.  Bones unremarkable.  CT CERVICAL SPINE FINDINGS  Beam hardening artifacts of dental origin and from LEFT shoulder prosthesis.  RIGHT thyroid mass 4.1 x 4.1 x 2.5 cm image 63.  Visualized skullbase intact.  Diffuse osseous demineralization.  Prevertebral soft tissues normal thickness.  Disc space narrowing with endplate spur formation at C5-C6.  Multilevel facet degenerative changes.  Vertebral body heights maintained without  fracture or subluxation.  No bone destruction.  Lung apices clear.  IMPRESSION: Atrophy with small vessel chronic ischemic changes of deep cerebral white matter.  No acute intracranial abnormalities.  Degenerative disc and facet disease changes cervical spine.  No acute cervical spine abnormalities.  RIGHT thyroid mass 4.1 x  4.1 x 2.5 cm; dedicated thyroid ultrasound followup recommended.   Electronically Signed   By: Lavonia Dana M.D.   On: 11/05/2014 10:46   US Soft Tissue Head/neck  11/07/2014   CLINICAL DATA:  81 year old female with a history of thyroid mass.  EXAM: THYROID ULTRASOUND  TECHNIQUE: Ultrasound examination of the thyroid gland and adjacent soft tissues was performed.  COMPARISON:  None.  FINDINGS: Right thyroid lobe  Measurements: 5.7 cm x 3.5 cm x 3.0 cm. Solid right thyroid nodule measures 4.0 cm x 2.6 cm x 2.6 cm  Left thyroid lobe  Measurements: 3.5 cm x 1.4 cm x 1.4 cm.  No nodules visualized.  Isthmus  Thickness: 4 mm.  No nodules visualized.  Lymphadenopathy  None visualized.  IMPRESSION: Single right-sided nodule which meets criteria for biopsy.  Ultrasound-guided fine needle aspiration should be considered, as per the consensus statement: Management of Thyroid Nodules Detected at [place]: Society of Radiologists in [place]. Radiology 2005; [id number].  Signed,  Dulcy Fanny. Earleen Newport, DO  [facility]  [facility]   Electronically Signed   By: Corrie Mckusick D.O.   On: 11/07/2014 08:17   Dg Pelvis Portable  11/27/2014   CLINICAL DATA:  Postop from revision of left hip prosthesis.  EXAM: PORTABLE PELVIS 1-2 VIEWS  COMPARISON:  11/07/2014  FINDINGS: Left hip prosthesis is seen in appropriate position. A probable fracture through the lesser trochanter is seen adjacent to the femoral component of the prosthesis. No evidence of dislocation.  Calcified fibroid again seen within the right pelvis.  IMPRESSION: Left hip  prosthesis in appropriate position, with probable fracture through the lesser trochanter adjacent to the femoral component of the prosthesis.   Electronically Signed   By: Earle Gell M.D.   On: 11/27/2014 22:01   Pelvis Portable  11/07/2014   CLINICAL DATA:  Status post left hip replacement  EXAM: PORTABLE PELVIS 1-2 VIEWS  COMPARISON:  None.  FINDINGS: A left hip prosthesis is noted. No acute abnormality is noted. The pelvic ring is intact. A calcified uterine fibroid is seen.  IMPRESSION: Status post left hip replacement, no acute abnormality noted.   Electronically Signed   By: Inez Catalina M.D.   On: 11/07/2014 16:26   Ct Femur Left Wo Contrast  11/21/2014   CLINICAL DATA:  History of 2 recent falls in the past 2 weeks. Status post left hip arthroplasty 10/19/2014  EXAM: CT OF THE LEFT FEMUR WITHOUT CONTRAST  TECHNIQUE: Multidetector CT imaging was performed according to the standard protocol. Multiplanar CT image reconstructions were also generated.  COMPARISON:  Radiographs 11/06/2016  FINDINGS: There is a longitudinal split type fracture involving the upper left femur. The fracture extends from the anterior aspect of the greater trochanter and continues down into the subtrochanteric region of the upper femoral shaft posteriorly.  The prosthesis is intact.  The bipolar portion is normally located.  IMPRESSION: Mildly displaced longitudinal split type fracture involving the greater trochanter and upper femoral shaft.  These results will be called to the ordering clinician or representative by the Radiologist Assistant, and communication documented in the PACS or zVision Dashboard.   Electronically Signed   By: Marijo Sanes M.D.   On: 11/21/2014 15:12   Dg Chest Port 1 View  11/09/2014   CLINICAL DATA:  81 year old female with a history of left hip surgery.  EXAM: PORTABLE CHEST - 1 VIEW  COMPARISON:  11/05/2014, 10/07/2014  FINDINGS: Cardiomediastinal silhouette unchanged in size  and contour.  Atherosclerotic calcifications of the aortic arch.  Unchanged position of Halter monitor on the left chest wall.  Diffusely coarsened interstitial markings. No confluent airspace disease. No pneumothorax or large pleural effusion.  Partially imaged surgical changes of the left shoulder.  Vertebroplasty of 3 levels again noted.  Surgical changes of prior breast augmentation.  IMPRESSION: No evidence of lobar pneumonia with background of chronic lung changes.  Atherosclerosis.  Signed,  Dulcy Fanny. Earleen Newport, DO  [facility]  [facility]   Electronically Signed   By: Corrie Mckusick D.O.   On: 11/09/2014 10:25   Dg Hip Operative Unilat With Pelvis Left  11/27/2014   CLINICAL DATA:  Revision arthroplasty of the left hip. Intraoperative imaging.  EXAM: OPERATIVE LEFT HIP (WITH PELVIS IF PERFORMED) 1 VIEW  TECHNIQUE: Fluoroscopic spot image(s) were submitted for interpretation post-operatively.  FLUOROSCOPY TIME:  Radiation Exposure Index (as provided by the fluoroscopic device):  If the device does not provide the exposure index:  Fluoroscopy Time:  0 minutes and 13 seconds  Number of Acquired Images:  1  COMPARISON:  11/07/2014  FINDINGS: The arthroplasty appears well seated and aligned on this single view. There is no acute fracture or evidence of an operative complication.  IMPRESSION: Well aligned left hip hemiarthroplasty.   Electronically Signed   By: Lajean Manes M.D.   On: 11/27/2014 15:01   Dg Hip Operative Unilat With Pelvis Left  11/07/2014   CLINICAL DATA:  Subcapital femoral neck fracture  EXAM: OPERATIVE LEFT HIP WITH PELVIS  COMPARISON:  None.  FLUOROSCOPY TIME:  Radiation Exposure Index (as provided by the fluoroscopic device): Not available  If the device does not provide the exposure index:  Fluoroscopy Time:  32 seconds  Number of Acquired Images:  Or  FINDINGS: The left subcapital femoral neck fracture is again noted left hip replacement was then  performed. No acute abnormality is noted.  IMPRESSION: Status post left hip replacement   Electronically Signed   By: Inez Catalina M.D.   On: 11/07/2014 14:15   Dg Hip Unilat With Pelvis 2-3 Views Left  11/05/2014   CLINICAL DATA:  Status post fall at nursing home. Clinical shortening of the left leg with lateral rotation, severe pain  EXAM: LEFT HIP (WITH PELVIS) 2-3 VIEWS  COMPARISON:  None.  FINDINGS: The patient has sustained complete fracture through the subcapital region of the left hip. There is mild superior migration of the femur with respect to the femoral head. The bony pelvis is intact. There is a calcified fibroid demonstrated.  IMPRESSION: The patient has sustained an acute subcapital fracture of the left hip with superior migration of the femur with respect to the femoral head.   Electronically Signed   By: David  Martinique   On: 11/05/2014 08:48     CBC  Recent Labs Lab 11/27/14 1108 11/27/14 2032 11/28/14 0500  WBC 13.0* 16.2* 12.9*  HGB 10.5* 8.4* 7.4*  HCT 35.1* 28.1* 24.4*  PLT 468* 447* 429*  MCV 90.5 90.6 90.7  MCH 27.1 27.1 27.5  MCHC 29.9* 29.9* 30.3  RDW 14.6 14.8 14.9  LYMPHSABS 1.4  --   --   MONOABS 0.8  --   --   EOSABS 0.2  --   --   BASOSABS 0.0  --   --     Chemistries   Recent Labs Lab 11/27/14 1108 11/28/14 0500  NA 141 140  K 4.7 5.1  CL 107 108  CO2 27 26  GLUCOSE 94 116*  BUN 23* 27*  CREATININE 1.13* 1.35*  CALCIUM 9.0 8.0*  AST 24  --   ALT 17  --   ALKPHOS 153*  --   BILITOT 0.6  --    ------------------------------------------------------------------------------------------------------------------ estimated creatinine clearance is 28.9 mL/min (by C-G formula based on Cr of 1.35). ------------------------------------------------------------------------------------------------------------------ No results for input(s): HGBA1C in the last 72  hours. ------------------------------------------------------------------------------------------------------------------ No results for input(s): CHOL, HDL, LDLCALC, TRIG, CHOLHDL, LDLDIRECT in the last 72 hours. ------------------------------------------------------------------------------------------------------------------ No results for input(s): TSH, T4TOTAL, T3FREE, THYROIDAB in the last 72 hours.  Invalid input(s): FREET3 ------------------------------------------------------------------------------------------------------------------ No results for input(s): VITAMINB12, FOLATE, FERRITIN, TIBC, IRON, RETICCTPCT in the last 72 hours.  Coagulation profile  Recent Labs Lab 11/27/14 1108  INR 1.02    No results for input(s): DDIMER in the last 72 hours.  Cardiac Enzymes No results for input(s): CKMB, TROPONINI, MYOGLOBIN in the last 168 hours.  Invalid input(s): CK ------------------------------------------------------------------------------------------------------------------ Invalid input(s): POCBNP     Time Spent in minutes   25 minutes   Rhonin Trott M.D on 11/28/2014 at 11:09 AM  Between 7am to 7pm - Pager - [phone]  After 7pm go to [email or web address] - password [facility]  [facility]   Office  [phone]

## 2014-11-28 NOTE — Progress Notes (Signed)
Occupational Therapy Evaluation Patient Details Name: Laura [name] MRN: [id number] DOB: 17-Dec-1923 Today's Date: 11/28/2014    History of Present Illness Patient is a 81 y/o female s/p left hip revision after fall at dementia unit. PMH of Lt hip hemiarthroplasty 11/07/2014, HTN, Alzhemier's disease, anxiety, depression and COPD.   Clinical Impression   Patient mod I > supervision PTA (per chart, pt poor historian). Patient currently functioning at an overall mod > total assist level. Patient will benefit from acute OT to increase overall independence in the areas of ADLs, functional mobility, and overall safety in order to safely discharge to venue listed below/back to dementia unit?.     Follow Up Recommendations  SNF;Supervision/Assistance - 24 hour    Equipment Recommendations  None recommended by OT    Recommendations for Other Services  None at this time   Precautions / Restrictions Precautions Precautions: Fall Restrictions Weight Bearing Restrictions: Yes LLE Weight Bearing: Weight bearing as tolerated      Mobility Bed Mobility Overal bed mobility: Needs Assistance Bed Mobility: Supine to Sit     Supine to sit: Mod assist;HOB elevated Sit to supine: Mod assist;HOB elevated   General bed mobility comments: Assistance for management of BLEs. Multimodal cues for trunk support and to scoot EOB.   Transfers Overall transfer level: Needs assistance Equipment used: Rolling walker (2 wheeled) Transfers: Sit to/from Stand Sit to Stand: Mod assist General transfer comment: Cues required for hand placement, technique, and safety.     Balance Overall balance assessment: Needs assistance Sitting-balance support: No upper extremity supported;Feet supported Sitting balance-Leahy Scale: Poor Sitting balance - Comments: Sat EOB with UE support and Min guard A. Postural control: Posterior lean Standing balance support: Bilateral upper extremity supported;During  functional activity Standing balance-Leahy Scale: Poor Standing balance comment: Requires UE support on RW and Min A for balance.    ADL Overall ADL's : Needs assistance/impaired Eating/Feeding: Set up;Bed level   Grooming: Set up;Bed level   Upper Body Bathing: Minimal assitance;Bed level   Lower Body Bathing: Total assistance;Sit to/from stand;Cueing for safety   Upper Body Dressing : Minimal assistance;Bed level   Lower Body Dressing: Total assistance;Sit to/from stand;Cueing for safety   Toilet Transfer: Moderate assistance;RW;BSC;Ambulation           Functional mobility during ADLs: Moderate assistance;Rolling walker General ADL Comments: Patient limted by pain and decreased ROM, strength, endurance. Patient unable to reach BLEs for LB ADLs and with poor dynamic sitting balance, unsupported EOB. Patient stood with mod assist and ambulated a few steps> recliner. Patient required mod assist for short distance functional ambulation and max assist for transfer into recliner due to increased pain and LOB. Patient will benefit from continued occupational therapy to reach PLOF. According to chart patient was mod I using RW.     Pertinent Vitals/Pain Pain Assessment: Faces Faces Pain Scale: Hurts whole lot Pain Location: left hip with movement and transfers Pain Descriptors / Indicators: Grimacing Pain Intervention(s): Monitored during session;Repositioned     Hand Dominance Right   Extremity/Trunk Assessment Upper Extremity Assessment Upper Extremity Assessment: Generalized weakness   Lower Extremity Assessment Lower Extremity Assessment: Defer to PT evaluation RLE Deficits / Details: AROM WFL. Able to stand without knee buckling. LLE Deficits / Details: Limited AROM secondary to pain and cognition. Able to perform LAQ. LLE: Unable to fully assess due to pain   Cervical / Trunk Assessment Cervical / Trunk Assessment: Normal   Communication Communication Communication:  Other (comment)   Cognition  Arousal/Alertness: Awake/alert Behavior During Therapy: WFL for tasks assessed/performed Overall Cognitive Status: History of cognitive impairments - at baseline       Memory: Decreased short-term memory              Home Living Family/patient expects to be discharged to:: Skilled nursing facility Living Arrangements:  (from dementia living unit) Available Help at Discharge: [place] Type of Home: Other(Comment) (dementia unit)   Home Equipment: Walker - 2 wheels   Additional Comments: Information taken from notes and prior history as pt is a poor historian    Prior Functioning/Environment Level of Independence: Needs assistance  Comments: During prvious admission, notes indicate patient was ambulatory with a rolling walker.     OT Diagnosis: Generalized weakness;Acute pain   OT Problem List: Decreased strength;Decreased range of motion;Decreased activity tolerance;Impaired balance (sitting and/or standing);Decreased cognition;Decreased safety awareness;Decreased knowledge of use of DME or AE;Decreased knowledge of precautions;Pain   OT Treatment/Interventions: Self-care/ADL training;Therapeutic exercise;Energy conservation;DME and/or AE instruction;Therapeutic activities;Patient/family education;Balance training    OT Goals(Current goals can be found in the care plan section) Acute Rehab OT Goals Patient Stated Goal: none stated OT Goal Formulation: With patient Time For Goal Achievement: 12/05/14 Potential to Achieve Goals: Good ADL Goals Pt Will Perform Grooming: with set-up;sitting (unsupported) Pt Will Perform Upper Body Bathing: with set-up;sitting (unsupported) Pt Will Perform Lower Body Bathing: with mod assist;sit to/from stand Pt Will Perform Upper Body Dressing: with set-up;sitting (unsupported) Pt Will Perform Lower Body Dressing: with mod assist;sit to/from stand Pt Will Transfer to Toilet: with min assist;bedside  commode;ambulating  OT Frequency: Min 2X/week   Barriers to D/C: Decreased caregiver support  None known, patient lived on a dementia unit PTA   End of Session Equipment Utilized During Treatment: Rolling walker;Gait belt  Activity Tolerance: Patient tolerated treatment well Patient left: in chair;with call bell/phone within reach;with chair alarm set   Time: 8177-1165 OT Time Calculation (min): 19 min Charges:  OT General Charges $OT Visit: 1 Procedure OT Evaluation $Initial OT Evaluation Tier I: 1 Procedure  Syniah Berne , MS, OTR/L, CLT Pager: [phone]  11/28/2014, 3:00 PM

## 2014-11-28 NOTE — Care Management Note (Signed)
Case Management Note  Patient Details  Name: Laura [name] MRN: [id number] Date of Birth: [birth year]  Subjective/Objective:     Patient admitted with periprosthetic fracture of left hip. Patient underwent a left hip revision.       Action/Plan:    Patient will need shortterm rehab at [facility]. She is from [facility]. Social Worker is aware.   Expected Discharge Date: 11/29/14                   Expected Discharge Plan: SNF    In-House Referral:  Clinical Social Work  Discharge planning Services  CM Consult  Post Acute Care Choice:  NA Choice offered to:     DME Arranged:    DME Agency:     HH Arranged:    HH Agency:     Status of Service:  Completed, signed off  Medicare Important Message Given:    Date Medicare IM Given:    Medicare IM give by:    Date Additional Medicare IM Given:    Additional Medicare Important Message give by:     If discussed at [place] of Stay Meetings, dates discussed:    Additional Comments:  Ninfa Meeker, RN 11/28/2014, 1:43 PM

## 2014-11-28 NOTE — Progress Notes (Signed)
   Subjective:  Patient reports pain as mild.    Objective:   VITALS:   Filed Vitals:   11/27/14 1828 11/27/14 2125 11/28/14 0455 11/28/14 1219  BP: 97/66 104/48 94/42 104/44  Pulse: 77 85 108 98  Temp: 98.5 F (36.9 C) 98.4 F (36.9 C) 99.1 F (37.3 C) 98.7 F (37.1 C)  TempSrc: Oral Oral Oral Oral  Resp: 14 17 15 16   Height:      Weight:      SpO2:  95% 93% 93%    Neurovascular intact Sensation intact distally Intact pulses distally Dorsiflexion/Plantar flexion intact Incision: dressing C/D/I and no drainage No cellulitis present Compartment soft   Lab Results  Component Value Date   WBC 12.9* 11/28/2014   HGB 7.4* 11/28/2014   HCT 24.4* 11/28/2014   MCV 90.7 11/28/2014   PLT 429* 11/28/2014     Assessment/Plan:  1 Day Post-Op   - Expected postop acute blood loss anemia - will monitor for symptoms - Up with PT/OT - DVT ppx - SCDs, ambulation, lovenox - WBAT left lower extremity - Pain control - Discharge planning - appreciate hospitalist comanagement - 1 unit of blood ordered  Marianna Payment 11/28/2014, 12:31 PM [phone]

## 2014-11-28 NOTE — Evaluation (Signed)
Physical Therapy Evaluation Patient Details Name: Laura [name] MRN: [id number] DOB: 09-12-1923 Today's Date: 11/28/2014   History of Present Illness  Patient is a 81 y/o female s/p left hip revision after fall at dementia unit. PMH of Lt hip hemiarthroplasty 11/07/2014, HTN, Alzhemier's disease, anxiety, depression and COPD.  Clinical Impression  Patient presents with pain, weakness, baseline cognitive deficits and balance impairments impacting mobility. Tolerated sitting EOB and standing with Mod A for safety/support. Refused to ambulate due to pain. Pt from dementia unit living facility. Not able to obtain accurate PLOF/history due to hx of dementia. Pt would benefit from skilled PT to improve transfers, gait, balance and mobility so pt can maximize independence, ease burden of care and minimize fall risk.    Follow Up Recommendations SNF;Supervision/Assistance - 24 hour    Equipment Recommendations  None recommended by PT    Recommendations for Other Services       Precautions / Restrictions Precautions Precautions: Fall Restrictions Weight Bearing Restrictions: Yes LLE Weight Bearing: Weight bearing as tolerated      Mobility  Bed Mobility Overal bed mobility: Needs Assistance Bed Mobility: Supine to Sit     Supine to sit: Mod assist;HOB elevated Sit to supine: Mod assist;HOB elevated   General bed mobility comments: A for LEs and for truncal support to EOB. Cues and A to correct posterior lean in sitting.   Transfers Overall transfer level: Needs assistance Equipment used: Rolling walker (2 wheeled) Transfers: Sit to/from Stand Sit to Stand: Mod assist         General transfer comment: Mod A to rise from EOB with cues for hand placement. Stood from [employer] WB through LLE.  Ambulation/Gait Ambulation/Gait assistance:  (Pt declined ambulation today.)              Stairs            Wheelchair Mobility    Modified Rankin (Stroke  Patients Only)       Balance Overall balance assessment: Needs assistance;History of Falls Sitting-balance support: Feet supported;No upper extremity supported Sitting balance-Leahy Scale: Poor Sitting balance - Comments: Sat EOB with UE support and Min guard A. Postural control: Posterior lean Standing balance support: During functional activity;Bilateral upper extremity supported Standing balance-Leahy Scale: Poor Standing balance comment: Requires UE support on RW and Min A for balance.                             Pertinent Vitals/Pain Pain Assessment: Faces Faces Pain Scale: Hurts even more Pain Location: left hip with movement Pain Descriptors / Indicators: Grimacing;Guarding;Sore Pain Intervention(s): Repositioned;Monitored during session;Limited activity within patient's tolerance;Ice applied    Home Living Family/patient expects to be discharged to:: Skilled nursing facility Living Arrangements:  (from dementia living unit) Available Help at Discharge: [place]: Gilford Rile - 2 wheels Additional Comments: Information taken from notes and prior history as pt is a poor historian    Prior Function Level of Independence: Needs assistance         Comments: During prvious admission, notes indicate patient was ambulatory with a rolling walker.      Hand Dominance        Extremity/Trunk Assessment   Upper Extremity Assessment: Defer to OT evaluation           Lower Extremity Assessment: Difficult to assess due to impaired cognition;RLE deficits/detail;LLE deficits/detail  RLE Deficits / Details: AROM WFL. Able to stand without knee buckling. LLE Deficits / Details: Limited AROM secondary to pain and cognition. Able to perform LAQ.     Communication   Communication: Other (comment)  Cognition Arousal/Alertness: Awake/alert Behavior During Therapy: WFL for tasks assessed/performed Overall Cognitive Status:  History of cognitive impairments - at baseline                      General Comments      Exercises Total Joint Exercises Ankle Circles/Pumps: Both;10 reps;Supine      Assessment/Plan    PT Assessment Patient needs continued PT services  PT Diagnosis Difficulty walking;Acute pain;Altered mental status   PT Problem List Decreased strength;Pain;Decreased range of motion;Decreased cognition;Decreased activity tolerance;Decreased balance;Decreased mobility;Decreased safety awareness  PT Treatment Interventions Balance training;Gait training;DME instruction;Patient/family education;Functional mobility training;Therapeutic activities;Therapeutic exercise;Wheelchair mobility training;Modalities   PT Goals (Current goals can be found in the Care Plan section) Acute Rehab PT Goals Patient Stated Goal: none stated PT Goal Formulation: Patient unable to participate in goal setting Time For Goal Achievement: 12/12/14 Potential to Achieve Goals: Fair    Frequency Min 3X/week   Barriers to discharge        Co-evaluation               End of Session Equipment Utilized During Treatment: Gait belt Activity Tolerance: Patient limited by pain Patient left: in bed;with call bell/phone within reach;with bed alarm set Nurse Communication: Mobility status         Time: 7543-6067 PT Time Calculation (min) (ACUTE ONLY): 18 min   Charges:   PT Evaluation $Initial PT Evaluation Tier I: 1 Procedure     PT G Codes:        Cross Jorge A Jeovani Weisenburger 11/28/2014, 11:30 AM Wray Kearns, PT, DPT [phone]

## 2014-11-29 ENCOUNTER — Inpatient Hospital Stay (HOSPITAL_COMMUNITY): Payer: Medicare Other

## 2014-11-29 LAB — CBC
HCT: 23.7 % — ABNORMAL LOW (ref 36.0–46.0)
HEMOGLOBIN: 7.6 g/dL — AB (ref 12.0–15.0)
MCH: 28.7 pg (ref 26.0–34.0)
MCHC: 32.1 g/dL (ref 30.0–36.0)
MCV: 89.4 fL (ref 78.0–100.0)
Platelets: 379 10*3/uL (ref 150–400)
RBC: 2.65 MIL/uL — AB (ref 3.87–5.11)
RDW: 15 % (ref 11.5–15.5)
WBC: 14.1 10*3/uL — ABNORMAL HIGH (ref 4.0–10.5)

## 2014-11-29 LAB — HEMOGLOBIN AND HEMATOCRIT, BLOOD
HCT: 28 % — ABNORMAL LOW (ref 36.0–46.0)
HEMOGLOBIN: 8.9 g/dL — AB (ref 12.0–15.0)

## 2014-11-29 LAB — PREPARE RBC (CROSSMATCH)

## 2014-11-29 LAB — OCCULT BLOOD X 1 CARD TO LAB, STOOL: Fecal Occult Bld: NEGATIVE

## 2014-11-29 MED ORDER — SODIUM CHLORIDE 0.9 % IV SOLN
Freq: Once | INTRAVENOUS | Status: AC
  Administered 2014-11-29: 08:00:00 via INTRAVENOUS

## 2014-11-29 MED ORDER — SODIUM CHLORIDE 0.9 % IV SOLN: Freq: Once | INTRAVENOUS | Status: DC

## 2014-11-29 NOTE — Progress Notes (Signed)
Patient Demographics  Laura [name], is a 81 y.o. female, DOB - Dec 05, 1923, HBZ:[id number]  Admit date - 11/27/2014   Admitting Physician Naiping Ephriam Jenkins, MD  Outpatient Primary MD for the patient is Sande Brothers, MD  LOS - 2   No chief complaint on file.                                                            consult follow-up note  Admission HPI/Brief narrative: 81 year old with PMH significant for Thryroid mass, COPD emphysema, dementia, LBBB, recent hip fracture S/P repair 2 week ag was admitted on 5-11 for Revision of left hip arthroplasty of the femoral component. Medicine consulted to help with medical management.    Subjective:   [place] today has, No headache, No chest pain, No abdominal pain - No Nausea, No new weakness tingling or numbness, No Cough - SOB. She reports constipation  Assessment & Plan    Active Problems:   Periprosthetic fracture around internal prosthetic left hip joint   Hip arthritis  Left hip periprosthetic fracture - S/P Revision of left hip arthroplasty of the femoral component. Post operative management per ortho.  - Will discontinue Toradol IV every 6 hours for pain as patient is already on Mobic. - Management as per orthopedics.  Alzheimer's dementia;  - at risk for delirium, limit narcotics as possible.   UTI - Started on Rocephin. - Wound cultures growing gram-negative rods - Will monitor closely giving worsening leukocytosis  COPD and emphysema - No active wheezing Continue with Albuterol PRN>   Thyroid mass incidentally noted on CT:  - Need repeat thyroid ultrasound in 6 months.   Anemia;  - Repeat hemoglobin today is 7.6 after 1 unit transfusion, patient will be transfused another unit today. - We'll check Hemoccult stool.   DVT prophylaxis per Ortho.    Code Status: Full  Family Communication: None at  bedside     Procedures  Revision of left hip arthroplasty of the femoral component One unit packed red blood cell transfusion 5/12    Medications  Scheduled Meds: . sodium chloride   Intravenous Once  . sodium chloride   Intravenous Once  . cefTRIAXone (ROCEPHIN)  IV  1 g Intravenous Daily  . cholecalciferol  1,000 Units Oral Q breakfast  . enoxaparin  30 mg Subcutaneous Daily  . lactose free nutrition  237 mL Oral BID BM  . meloxicam  7.5 mg Oral Q breakfast  . pantoprazole  40 mg Oral Daily  . PARoxetine  40 mg Oral Daily  . senna-docusate  1 tablet Oral QHS  . temazepam  7.5 mg Oral QHS   Continuous Infusions: . sodium chloride 100 mL/hr at 11/29/14 0545  . lactated ringers 10 mL/hr at 11/27/14 1111   PRN Meds:.acetaminophen **OR** acetaminophen, albuterol, alum & mag hydroxide-simeth, baclofen, diphenhydrAMINE, guaifenesin, loperamide, magnesium hydroxide, menthol-cetylpyridinium **OR** phenol, metoCLOPramide **OR** metoCLOPramide (REGLAN) injection, morphine injection, ondansetron **OR** ondansetron (ZOFRAN) IV, oxyCODONE, polyethylene glycol, sorbitol  DVT Prophylaxis  Lovenox -  Lab Results  Component  Value Date   PLT 379 11/29/2014    Antibiotics   Anti-infectives    Start     Dose/Rate Route Frequency Ordered Stop   11/28/14 0800  cefTRIAXone (ROCEPHIN) 1 g in dextrose 5 % 50 mL IVPB - Premix     1 g 100 mL/hr over 30 Minutes Intravenous Daily 11/28/14 0708     11/27/14 1930  ceFAZolin (ANCEF) IVPB 2 g/50 mL premix     2 g 100 mL/hr over 30 Minutes Intravenous Every 6 hours 11/27/14 1806 11/28/14 0337   11/27/14 1100  ceFAZolin (ANCEF) IVPB 2 g/50 mL premix     2 g 100 mL/hr over 30 Minutes Intravenous To Surgery 11/26/14 1408 11/27/14 1327          Objective:   Filed Vitals:   11/29/14 0902 11/29/14 0928 11/29/14 1135 11/29/14 1406  BP: 122/47 121/39 122/51 117/45  Pulse: 115 106 120 120  Temp: 98.8 F (37.1 C) 98.6 F (37 C) 99.7 F (37.6  C) 98.9 F (37.2 C)  TempSrc: Oral Oral Oral Oral  Resp: 18 18 18 18   Height:      Weight:      SpO2: 91% 93% 92% 93%    Wt Readings from Last 3 Encounters:  11/27/14 73.483 kg (162 lb)  11/13/14 73.573 kg (162 lb 3.2 oz)  10/25/14 74.9 kg (165 lb 2 oz)     Intake/Output Summary (Last 24 hours) at 11/29/14 1447 Last data filed at 11/29/14 1135  Gross per 24 hour  Intake    790 ml  Output      0 ml  Net    790 ml     Physical Exam  Awake Alert, pleasant, No new F.N deficits, Normal affect [place].AT,PERRAL Supple Neck,No JVD, No cervical lymphadenopathy appriciated.  Symmetrical Chest wall movement, Good air movement bilaterally, CTAB RRR,No Gallops,Rubs or new Murmurs, No Parasternal Heave +ve B.Sounds, Abd Soft, No tenderness, No organomegaly appriciated, No rebound - guarding or rigidity. No Cyanosis, Clubbing or edema, pulses felt bilaterally.   Data Review   Micro Results Recent Results (from the past 240 hour(s))  Urine culture     Status: None (Preliminary result)   Collection Time: 11/27/14 11:06 PM  Result Value Ref Range Status   Specimen Description URINE, CATHETERIZED  Final   Special Requests NONE  Final   Colony Count   Final    7,000 COLONIES/ML Performed at [facility]    Culture   Final    Parachute Performed at [facility]    Report Status PENDING  Incomplete    Radiology Reports Dg Chest 2 View  11/05/2014   CLINICAL DATA:  [place] at nursing home, shortness of breath, preoperative assessment  EXAM: CHEST  2 VIEW  COMPARISON:  10/07/2014  FINDINGS: Loop recorder projects over LEFT chest.  Enlargement of cardiac silhouette with pulmonary vascular congestion.  Atherosclerotic calcification aorta.  Emphysematous and bronchitic changes consistent with COPD.  Chronic accentuation of perihilar markings unchanged.  No gross infiltrate, pleural effusion or pneumothorax.  Calcified BILATERAL breast prostheses.  Marked osseous  demineralization with multiple prior spinal augmentation procedures.  LEFT shoulder prosthesis.  IMPRESSION: COPD changes.  No acute abnormalities.   Electronically Signed   By: Lavonia Dana M.D.   On: 11/05/2014 08:48   Dg Knee 2 Views Left  11/05/2014   CLINICAL DATA:  Left leg foreshortened.  Left knee pain.  Fall.  EXAM: LEFT KNEE - 1-2 VIEW  COMPARISON:  None.  FINDINGS: Degenerative changes in the left knee with joint space narrowing, spurring, most pronounced in the lateral compartment. No fracture, subluxation or dislocation. Diffuse osteopenia. Soft tissues are intact and unremarkable.  IMPRESSION: Tricompartment degenerative changes.  No acute bony abnormality.   Electronically Signed   By: Rolm Baptise M.D.   On: 11/05/2014 08:49   Ct Head Wo Contrast  11/08/2014   CLINICAL DATA:  Dementia  EXAM: CT HEAD WITHOUT CONTRAST  TECHNIQUE: Contiguous axial images were obtained from the base of the skull through the vertex without intravenous contrast.  COMPARISON:  4/19/ 16  FINDINGS: No skull fracture is noted. Paranasal sinuses and mastoid air cells are unremarkable. Stable atrophy. Stable periventricular and patchy subcortical chronic white matter disease. Atherosclerotic calcifications of carotid siphon again noted. No acute cortical infarction. No mass lesion is noted on this unenhanced scan. The gray and white-matter differentiation is preserved.  IMPRESSION: No acute intracranial abnormality. Stable atrophy and chronic white matter disease.   Electronically Signed   By: Lahoma Crocker M.D.   On: 11/08/2014 14:06   Ct Head Wo Contrast  11/05/2014   CLINICAL DATA:  Fall, found on floor covered in urine at 0615 hours today dementia, LEFT hip pain, hypertension, Alzheimer's  EXAM: CT HEAD WITHOUT CONTRAST  CT CERVICAL SPINE WITHOUT CONTRAST  TECHNIQUE: Multidetector CT imaging of the head and cervical spine was performed following the standard protocol without intravenous contrast. Multiplanar CT image  reconstructions of the cervical spine were also generated.  COMPARISON:  10/07/2014  FINDINGS: CT HEAD FINDINGS  Generalized atrophy.  Normal ventricular morphology.  No midline shift or mass effect.  Small vessel chronic ischemic changes of deep cerebral white matter.  No intracranial hemorrhage, mass lesion, or acute infarction.  Visualized paranasal sinuses and mastoid air cells clear.  Bones unremarkable.  CT CERVICAL SPINE FINDINGS  Beam hardening artifacts of dental origin and from LEFT shoulder prosthesis.  RIGHT thyroid mass 4.1 x 4.1 x 2.5 cm image 63.  Visualized skullbase intact.  Diffuse osseous demineralization.  Prevertebral soft tissues normal thickness.  Disc space narrowing with endplate spur formation at C5-C6.  Multilevel facet degenerative changes.  Vertebral body heights maintained without fracture or subluxation.  No bone destruction.  Lung apices clear.  IMPRESSION: Atrophy with small vessel chronic ischemic changes of deep cerebral white matter.  No acute intracranial abnormalities.  Degenerative disc and facet disease changes cervical spine.  No acute cervical spine abnormalities.  RIGHT thyroid mass 4.1 x 4.1 x 2.5 cm; dedicated thyroid ultrasound followup recommended.   Electronically Signed   By: Lavonia Dana M.D.   On: 11/05/2014 10:46   Ct Cervical Spine Wo Contrast  11/05/2014   CLINICAL DATA:  Fall, found on floor covered in urine at 0615 hours today dementia, LEFT hip pain, hypertension, Alzheimer's  EXAM: CT HEAD WITHOUT CONTRAST  CT CERVICAL SPINE WITHOUT CONTRAST  TECHNIQUE: Multidetector CT imaging of the head and cervical spine was performed following the standard protocol without intravenous contrast. Multiplanar CT image reconstructions of the cervical spine were also generated.  COMPARISON:  10/07/2014  FINDINGS: CT HEAD FINDINGS  Generalized atrophy.  Normal ventricular morphology.  No midline shift or mass effect.  Small vessel chronic ischemic changes of deep cerebral  white matter.  No intracranial hemorrhage, mass lesion, or acute infarction.  Visualized paranasal sinuses and mastoid air cells clear.  Bones unremarkable.  CT CERVICAL SPINE FINDINGS  Beam hardening artifacts of dental origin and from  LEFT shoulder prosthesis.  RIGHT thyroid mass 4.1 x 4.1 x 2.5 cm image 63.  Visualized skullbase intact.  Diffuse osseous demineralization.  Prevertebral soft tissues normal thickness.  Disc space narrowing with endplate spur formation at C5-C6.  Multilevel facet degenerative changes.  Vertebral body heights maintained without fracture or subluxation.  No bone destruction.  Lung apices clear.  IMPRESSION: Atrophy with small vessel chronic ischemic changes of deep cerebral white matter.  No acute intracranial abnormalities.  Degenerative disc and facet disease changes cervical spine.  No acute cervical spine abnormalities.  RIGHT thyroid mass 4.1 x 4.1 x 2.5 cm; dedicated thyroid ultrasound followup recommended.   Electronically Signed   By: Lavonia Dana M.D.   On: 11/05/2014 10:46   US Soft Tissue Head/neck  11/07/2014   CLINICAL DATA:  81 year old female with a history of thyroid mass.  EXAM: THYROID ULTRASOUND  TECHNIQUE: Ultrasound examination of the thyroid gland and adjacent soft tissues was performed.  COMPARISON:  None.  FINDINGS: Right thyroid lobe  Measurements: 5.7 cm x 3.5 cm x 3.0 cm. Solid right thyroid nodule measures 4.0 cm x 2.6 cm x 2.6 cm  Left thyroid lobe  Measurements: 3.5 cm x 1.4 cm x 1.4 cm.  No nodules visualized.  Isthmus  Thickness: 4 mm.  No nodules visualized.  Lymphadenopathy  None visualized.  IMPRESSION: Single right-sided nodule which meets criteria for biopsy.  Ultrasound-guided fine needle aspiration should be considered, as per the consensus statement: Management of Thyroid Nodules Detected at [place]: Society of Radiologists in [place]. Radiology 2005; [id number].  Signed,  Dulcy Fanny. Earleen Newport, DO  [facility]  [facility]   Electronically Signed   By: Corrie Mckusick D.O.   On: 11/07/2014 08:17   Dg Pelvis Portable  11/27/2014   CLINICAL DATA:  Postop from revision of left hip prosthesis.  EXAM: PORTABLE PELVIS 1-2 VIEWS  COMPARISON:  11/07/2014  FINDINGS: Left hip prosthesis is seen in appropriate position. A probable fracture through the lesser trochanter is seen adjacent to the femoral component of the prosthesis. No evidence of dislocation.  Calcified fibroid again seen within the right pelvis.  IMPRESSION: Left hip prosthesis in appropriate position, with probable fracture through the lesser trochanter adjacent to the femoral component of the prosthesis.   Electronically Signed   By: Earle Gell M.D.   On: 11/27/2014 22:01   Pelvis Portable  11/07/2014   CLINICAL DATA:  Status post left hip replacement  EXAM: PORTABLE PELVIS 1-2 VIEWS  COMPARISON:  None.  FINDINGS: A left hip prosthesis is noted. No acute abnormality is noted. The pelvic ring is intact. A calcified uterine fibroid is seen.  IMPRESSION: Status post left hip replacement, no acute abnormality noted.   Electronically Signed   By: Inez Catalina M.D.   On: 11/07/2014 16:26   Ct Femur Left Wo Contrast  11/21/2014   CLINICAL DATA:  History of 2 recent falls in the past 2 weeks. Status post left hip arthroplasty 10/19/2014  EXAM: CT OF THE LEFT FEMUR WITHOUT CONTRAST  TECHNIQUE: Multidetector CT imaging was performed according to the standard protocol. Multiplanar CT image reconstructions were also generated.  COMPARISON:  Radiographs 11/06/2016  FINDINGS: There is a longitudinal split type fracture involving the upper left femur. The fracture extends from the anterior aspect of the greater trochanter and continues down into the subtrochanteric region of the upper femoral shaft posteriorly.  The prosthesis is intact.  The bipolar portion is  normally located.  IMPRESSION: Mildly displaced longitudinal split  type fracture involving the greater trochanter and upper femoral shaft.  These results will be called to the ordering clinician or representative by the Radiologist Assistant, and communication documented in the PACS or zVision Dashboard.   Electronically Signed   By: Marijo Sanes M.D.   On: 11/21/2014 15:12   Dg Chest Port 1 View  11/29/2014   CLINICAL DATA:  81 year old female with left femur fracture following hip arthroplasty in April. Status post hardware revision. Leukocytosis. Initial encounter.  EXAM: PORTABLE CHEST - 1 VIEW  COMPARISON:  11/09/2014 and earlier.  FINDINGS: Portable AP semi upright view at 0730 hours. Sequelae of left shoulder arthroplasty, left chest cardiac event recorder, and multilevel compression fracture augmentation re - identified. Calcified breast implants re - identified. Stable lung volumes. Stable cardiomegaly and mediastinal contours. No pneumothorax, pulmonary edema, pleural effusion or acute pulmonary opacity.  IMPRESSION: No acute cardiopulmonary abnormality.   Electronically Signed   By: Genevie Ann M.D.   On: 11/29/2014 08:00   Dg Chest Port 1 View  11/09/2014   CLINICAL DATA:  81 year old female with a history of left hip surgery.  EXAM: PORTABLE CHEST - 1 VIEW  COMPARISON:  11/05/2014, 10/07/2014  FINDINGS: Cardiomediastinal silhouette unchanged in size and contour. Atherosclerotic calcifications of the aortic arch.  Unchanged position of Halter monitor on the left chest wall.  Diffusely coarsened interstitial markings. No confluent airspace disease. No pneumothorax or large pleural effusion.  Partially imaged surgical changes of the left shoulder.  Vertebroplasty of 3 levels again noted.  Surgical changes of prior breast augmentation.  IMPRESSION: No evidence of lobar pneumonia with background of chronic lung changes.  Atherosclerosis.  Signed,  Dulcy Fanny. Earleen Newport, DO  [facility]  [facility]   Electronically Signed   By:  Corrie Mckusick D.O.   On: 11/09/2014 10:25   Dg Hip Operative Unilat With Pelvis Left  11/27/2014   CLINICAL DATA:  Revision arthroplasty of the left hip. Intraoperative imaging.  EXAM: OPERATIVE LEFT HIP (WITH PELVIS IF PERFORMED) 1 VIEW  TECHNIQUE: Fluoroscopic spot image(s) were submitted for interpretation post-operatively.  FLUOROSCOPY TIME:  Radiation Exposure Index (as provided by the fluoroscopic device):  If the device does not provide the exposure index:  Fluoroscopy Time:  0 minutes and 13 seconds  Number of Acquired Images:  1  COMPARISON:  11/07/2014  FINDINGS: The arthroplasty appears well seated and aligned on this single view. There is no acute fracture or evidence of an operative complication.  IMPRESSION: Well aligned left hip hemiarthroplasty.   Electronically Signed   By: Lajean Manes M.D.   On: 11/27/2014 15:01   Dg Hip Operative Unilat With Pelvis Left  11/07/2014   CLINICAL DATA:  Subcapital femoral neck fracture  EXAM: OPERATIVE LEFT HIP WITH PELVIS  COMPARISON:  None.  FLUOROSCOPY TIME:  Radiation Exposure Index (as provided by the fluoroscopic device): Not available  If the device does not provide the exposure index:  Fluoroscopy Time:  32 seconds  Number of Acquired Images:  Or  FINDINGS: The left subcapital femoral neck fracture is again noted left hip replacement was then performed. No acute abnormality is noted.  IMPRESSION: Status post left hip replacement   Electronically Signed   By: Inez Catalina M.D.   On: 11/07/2014 14:15   Dg Hip Unilat With Pelvis 2-3 Views Left  11/05/2014   CLINICAL DATA:  Status post fall at nursing home. Clinical shortening  of the left leg with lateral rotation, severe pain  EXAM: LEFT HIP (WITH PELVIS) 2-3 VIEWS  COMPARISON:  None.  FINDINGS: The patient has sustained complete fracture through the subcapital region of the left hip. There is mild superior migration of the femur with respect to the femoral head. The bony pelvis is intact. There is a  calcified fibroid demonstrated.  IMPRESSION: The patient has sustained an acute subcapital fracture of the left hip with superior migration of the femur with respect to the femoral head.   Electronically Signed   By: David  Martinique   On: 11/05/2014 08:48     CBC  Recent Labs Lab 11/27/14 1108 11/27/14 2032 11/28/14 0500 11/29/14 0315  WBC 13.0* 16.2* 12.9* 14.1*  HGB 10.5* 8.4* 7.4* 7.6*  HCT 35.1* 28.1* 24.4* 23.7*  PLT 468* 447* 429* 379  MCV 90.5 90.6 90.7 89.4  MCH 27.1 27.1 27.5 28.7  MCHC 29.9* 29.9* 30.3 32.1  RDW 14.6 14.8 14.9 15.0  LYMPHSABS 1.4  --   --   --   MONOABS 0.8  --   --   --   EOSABS 0.2  --   --   --   BASOSABS 0.0  --   --   --     Chemistries   Recent Labs Lab 11/27/14 1108 11/28/14 0500  NA 141 140  K 4.7 5.1  CL 107 108  CO2 27 26  GLUCOSE 94 116*  BUN 23* 27*  CREATININE 1.13* 1.35*  CALCIUM 9.0 8.0*  AST 24  --   ALT 17  --   ALKPHOS 153*  --   BILITOT 0.6  --    ------------------------------------------------------------------------------------------------------------------ estimated creatinine clearance is 28.9 mL/min (by C-G formula based on Cr of 1.35). ------------------------------------------------------------------------------------------------------------------ No results for input(s): HGBA1C in the last 72 hours. ------------------------------------------------------------------------------------------------------------------ No results for input(s): CHOL, HDL, LDLCALC, TRIG, CHOLHDL, LDLDIRECT in the last 72 hours. ------------------------------------------------------------------------------------------------------------------ No results for input(s): TSH, T4TOTAL, T3FREE, THYROIDAB in the last 72 hours.  Invalid input(s): FREET3 ------------------------------------------------------------------------------------------------------------------ No results for input(s): VITAMINB12, FOLATE, FERRITIN, TIBC, IRON, RETICCTPCT in  the last 72 hours.  Coagulation profile  Recent Labs Lab 11/27/14 1108  INR 1.02    No results for input(s): DDIMER in the last 72 hours.  Cardiac Enzymes No results for input(s): CKMB, TROPONINI, MYOGLOBIN in the last 168 hours.  Invalid input(s): CK ------------------------------------------------------------------------------------------------------------------ Invalid input(s): POCBNP     Time Spent in minutes   25 minutes   Auther Lyerly M.D on 11/29/2014 at 2:47 PM  Between 7am to 7pm - Pager - [phone]  After 7pm go to [email or web address] - password [facility]  [facility]   Office  [phone]

## 2014-11-29 NOTE — Clinical Social Work Note (Signed)
Clinical Social Work Assessment  Patient Details  Name: Laura [name] MRN: [id number] Date of Birth: 11-Dec-1923  Date of referral:  11/29/14               Reason for consult:  Facility Placement                Permission sought to share information with:  Facility Sport and exercise psychologist, Family Supports Permission granted to share information::  No (Patient not oriented)  Name::        Agency::     Relationship::   Shanon Brow (son-in-law [phone]))  Contact Information:     Housing/Transportation Living arrangements for the past 2 months:  [place], [place] Source of Information:  Friend/Neighbor Patient Interpreter Needed:  None Criminal Activity/Legal Involvement Pertinent to Current Situation/Hospitalization:  No - Comment as needed Significant Relationships:  Other(Comment) (son-in-law Shanon Brow) Lives with:  Facility Resident Do you feel safe going back to the place where you live?  Yes Need for family participation in patient care:  Yes (Comment) (patient alert though not oriented )  Care giving concerns:  None at this time   Facilities manager / plan:  CSW spoke with Shanon Brow who is the patient's son-in-law (though patient's daughter passed).  Shanon Brow continues to be the main caregiver for the patient as she has no family supports.  Patient is from [facility] and will return at time of discharge.  CSW confirmed with Shanon Brow and [facility].  Transportation will be PTAR and projected discharge is Sunday.  Employment status:  Retired Forensic scientist:  [employer] PT Recommendations:  [place] / Referral to community resources:  [place]  Patient/Family's Response to care:  Shanon Brow was appreciative of CSW involvement and states it is a "peace of mind knowing patient is being taken care of and plans are being made on her behalf"  Patient/Family's Understanding of and Emotional Response to  Diagnosis, Current Treatment, and Prognosis:  Shanon Brow is realistic regarding patient's px and plan for STR and has understanding that patient may ultimately need LTC.  Emotional Assessment Appearance:  Appears stated age Attitude/Demeanor/Rapport:  Unable to Assess Affect (typically observed):  Calm Orientation:  Oriented to Self Alcohol / Substance use:  Not Applicable Psych involvement (Current and /or in the community):  No (Comment)  Discharge Needs  Concerns to be addressed:  No discharge needs identified Readmission within the last 30 days:  No Current discharge risk:  None Barriers to Discharge:  No Barriers Identified   Dulcy Fanny, LCSW 11/29/2014, 1:05 PM

## 2014-11-29 NOTE — Progress Notes (Signed)
   Subjective:  Patient reports pain as mild.    Objective:   VITALS:   Filed Vitals:   11/28/14 1240 11/28/14 1549 11/28/14 2040 11/29/14 0618  BP: 102/49 107/37 135/55 118/51  Pulse: 98 106 123 108  Temp: 98.7 F (37.1 C) 98.4 F (36.9 C) 100.3 F (37.9 C) 99.9 F (37.7 C)  TempSrc: Oral Oral Oral Oral  Resp: 16 16 17 18   Height:      Weight:      SpO2: 93% 94% 92% 96%    Dressings c/d/i Thigh soft No drainage   Lab Results  Component Value Date   WBC 14.1* 11/29/2014   HGB 7.6* 11/29/2014   HCT 23.7* 11/29/2014   MCV 89.4 11/29/2014   PLT 379 11/29/2014     Assessment/Plan:  2 Days Post-Op   - Expected postop acute blood loss anemia - will monitor for symptoms - Up with PT/OT - SNF pending - DVT ppx - SCDs, ambulation, lovenox - WBAT left lower extremity - appreciate hospitalist comanagement - 1 unit of blood ordered - not ready for dc yet, will monitor Hg  Marianna Payment 11/29/2014, 9:01 AM [phone]

## 2014-11-30 LAB — CBC
HCT: 26.4 % — ABNORMAL LOW (ref 36.0–46.0)
HEMOGLOBIN: 8.3 g/dL — AB (ref 12.0–15.0)
MCH: 28 pg (ref 26.0–34.0)
MCHC: 31.4 g/dL (ref 30.0–36.0)
MCV: 89.2 fL (ref 78.0–100.0)
Platelets: 344 10*3/uL (ref 150–400)
RBC: 2.96 MIL/uL — AB (ref 3.87–5.11)
RDW: 15.3 % (ref 11.5–15.5)
WBC: 13 10*3/uL — ABNORMAL HIGH (ref 4.0–10.5)

## 2014-11-30 LAB — BASIC METABOLIC PANEL
Anion gap: 6 (ref 5–15)
BUN: 20 mg/dL (ref 6–20)
CALCIUM: 8.1 mg/dL — AB (ref 8.9–10.3)
CHLORIDE: 105 mmol/L (ref 101–111)
CO2: 26 mmol/L (ref 22–32)
CREATININE: 0.98 mg/dL (ref 0.44–1.00)
GFR calc Af Amer: 58 mL/min — ABNORMAL LOW (ref >60)
GFR calc non Af Amer: 50 mL/min — ABNORMAL LOW (ref >60)
Glucose, Bld: 93 mg/dL (ref 65–99)
Potassium: 4.2 mmol/L (ref 3.5–5.1)
Sodium: 137 mmol/L (ref 135–145)

## 2014-11-30 LAB — TYPE AND SCREEN
ABO/RH(D): A NEG
ANTIBODY SCREEN: NEGATIVE
UNIT DIVISION: 0
UNIT DIVISION: 0

## 2014-11-30 MED ORDER — POLYETHYLENE GLYCOL 3350 17 G PO PACK
17.0000 g | PACK | Freq: Two times a day (BID) | ORAL | Status: AC
  Administered 2014-11-30 – 2014-12-01 (×3): 17 g via ORAL
  Filled 2014-11-30 (×3): qty 1

## 2014-11-30 MED ORDER — BISACODYL 10 MG RE SUPP
10.0000 mg | Freq: Two times a day (BID) | RECTAL | Status: AC
  Administered 2014-11-30: 10 mg via RECTAL
  Filled 2014-11-30 (×2): qty 1

## 2014-11-30 NOTE — Progress Notes (Signed)
Patient Demographics  Laura [name], is a 81 y.o. female, DOB - 10/10/1923, WNI:[id number]  Admit date - 11/27/2014   Admitting Physician Naiping Ephriam Jenkins, MD  Outpatient Primary MD for the patient is Sande Brothers, MD  LOS - 3   No chief complaint on file.                                                            consult follow-up note  Admission HPI/Brief narrative: 81 year old with PMH significant for Thryroid mass, COPD emphysema, dementia, LBBB, recent hip fracture S/P repair 2 week ag was admitted on 5-11 for Revision of left hip arthroplasty of the femoral component. Medicine consulted to help with medical management.    Subjective:   [place] today has, No headache, No chest pain, No abdominal pain - No Nausea, No new weakness tingling or numbness, No Cough - SOB. Still reports constipation  Assessment & Plan    Active Problems:   Periprosthetic fracture around internal prosthetic left hip joint   Hip arthritis  Left hip periprosthetic fracture - S/P Revision of left hip arthroplasty of the femoral component. Post operative management per ortho.  - Management as per orthopedics.  Alzheimer's dementia;  - at risk for delirium, limit narcotics as possible.   UTI - Started on Rocephin 5/12, will stop today, as finished 3 days. - Wound cultures growing gram-negative rods -Improving leukocytosis  COPD and emphysema - No active wheezing Continue with Albuterol PRN>   Thyroid mass incidentally noted on CT:  - Need repeat thyroid ultrasound in 6 months.   Anemia;  - Postop acute blood loss anemia, hemoglobin is stable today, continue to monitor. - Hemoccult negative   DVT prophylaxis per Ortho.    Code Status: Full  Family Communication: None at bedside     Procedures  Revision of left hip arthroplasty of the femoral component One unit packed red blood cell  transfusion 5/12 , and 5/13    Medications  Scheduled Meds: . sodium chloride   Intravenous Once  . sodium chloride   Intravenous Once  . cefTRIAXone (ROCEPHIN)  IV  1 g Intravenous Daily  . cholecalciferol  1,000 Units Oral Q breakfast  . enoxaparin  30 mg Subcutaneous Daily  . lactose free nutrition  237 mL Oral BID BM  . meloxicam  7.5 mg Oral Q breakfast  . pantoprazole  40 mg Oral Daily  . PARoxetine  40 mg Oral Daily  . senna-docusate  1 tablet Oral QHS  . temazepam  7.5 mg Oral QHS   Continuous Infusions: . sodium chloride 100 mL/hr at 11/29/14 1952  . lactated ringers 10 mL/hr at 11/27/14 1111   PRN Meds:.acetaminophen **OR** acetaminophen, albuterol, alum & mag hydroxide-simeth, baclofen, diphenhydrAMINE, guaifenesin, loperamide, magnesium hydroxide, menthol-cetylpyridinium **OR** phenol, metoCLOPramide **OR** metoCLOPramide (REGLAN) injection, morphine injection, ondansetron **OR** ondansetron (ZOFRAN) IV, oxyCODONE, polyethylene glycol, sorbitol  DVT Prophylaxis  Lovenox -  Lab Results  Component Value Date   PLT 344 11/30/2014    Antibiotics   Anti-infectives  Start     Dose/Rate Route Frequency Ordered Stop   11/28/14 0800  cefTRIAXone (ROCEPHIN) 1 g in dextrose 5 % 50 mL IVPB - Premix     1 g 100 mL/hr over 30 Minutes Intravenous Daily 11/28/14 0708     11/27/14 1930  ceFAZolin (ANCEF) IVPB 2 g/50 mL premix     2 g 100 mL/hr over 30 Minutes Intravenous Every 6 hours 11/27/14 1806 11/28/14 0337   11/27/14 1100  ceFAZolin (ANCEF) IVPB 2 g/50 mL premix     2 g 100 mL/hr over 30 Minutes Intravenous To Surgery 11/26/14 1408 11/27/14 1327          Objective:   Filed Vitals:   11/29/14 1135 11/29/14 1406 11/29/14 2057 11/30/14 0551  BP: 122/51 117/45 128/57 124/47  Pulse: 120 120 121 118  Temp: 99.7 F (37.6 C) 98.9 F (37.2 C) 98.6 F (37 C) 98.7 F (37.1 C)  TempSrc: Oral Oral Oral   Resp: 18 18 18 18   Height:      Weight:      SpO2: 92%  93% 95% 94%    Wt Readings from Last 3 Encounters:  11/27/14 73.483 kg (162 lb)  11/13/14 73.573 kg (162 lb 3.2 oz)  10/25/14 74.9 kg (165 lb 2 oz)     Intake/Output Summary (Last 24 hours) at 11/30/14 1156 Last data filed at 11/30/14 0800  Gross per 24 hour  Intake    120 ml  Output      0 ml  Net    120 ml     Physical Exam  Awake Alert, pleasant, No new F.N deficits, Normal affect [place].AT,PERRAL Supple Neck,No JVD, No cervical lymphadenopathy appriciated.  Symmetrical Chest wall movement, Good air movement bilaterally, CTAB RRR,No Gallops,Rubs or new Murmurs, No Parasternal Heave +ve B.Sounds, Abd Soft, No tenderness, No organomegaly appriciated, No rebound - guarding or rigidity. No Cyanosis, Clubbing or edema, pulses felt bilaterally.   Data Review   Micro Results Recent Results (from the past 240 hour(s))  Urine culture     Status: None (Preliminary result)   Collection Time: 11/27/14 11:06 PM  Result Value Ref Range Status   Specimen Description URINE, CATHETERIZED  Final   Special Requests NONE  Final   [place] Count   Final    7,000 COLONIES/ML Performed at [facility]    Culture   Final    [place] Performed at [facility]    Report Status PENDING  Incomplete    Radiology Reports Dg Chest 2 View  11/05/2014   CLINICAL DATA:  [place] at nursing home, shortness of breath, preoperative assessment  EXAM: CHEST  2 VIEW  COMPARISON:  10/07/2014  FINDINGS: Loop recorder projects over LEFT chest.  Enlargement of cardiac silhouette with pulmonary vascular congestion.  Atherosclerotic calcification aorta.  Emphysematous and bronchitic changes consistent with COPD.  Chronic accentuation of perihilar markings unchanged.  No gross infiltrate, pleural effusion or pneumothorax.  Calcified BILATERAL breast prostheses.  Marked osseous demineralization with multiple prior spinal augmentation procedures.  LEFT shoulder prosthesis.  IMPRESSION: COPD  changes.  No acute abnormalities.   Electronically Signed   By: Lavonia Dana M.D.   On: 11/05/2014 08:48   Dg Knee 2 Views Left  11/05/2014   CLINICAL DATA:  Left leg foreshortened.  Left knee pain.  Fall.  EXAM: LEFT KNEE - 1-2 VIEW  COMPARISON:  None.  FINDINGS: Degenerative changes in the left knee with joint space narrowing, spurring,  most pronounced in the lateral compartment. No fracture, subluxation or dislocation. Diffuse osteopenia. Soft tissues are intact and unremarkable.  IMPRESSION: Tricompartment degenerative changes.  No acute bony abnormality.   Electronically Signed   By: Rolm Baptise M.D.   On: 11/05/2014 08:49   Ct Head Wo Contrast  11/08/2014   CLINICAL DATA:  Dementia  EXAM: CT HEAD WITHOUT CONTRAST  TECHNIQUE: Contiguous axial images were obtained from the base of the skull through the vertex without intravenous contrast.  COMPARISON:  4/19/ 16  FINDINGS: No skull fracture is noted. Paranasal sinuses and mastoid air cells are unremarkable. Stable atrophy. Stable periventricular and patchy subcortical chronic white matter disease. Atherosclerotic calcifications of carotid siphon again noted. No acute cortical infarction. No mass lesion is noted on this unenhanced scan. The gray and white-matter differentiation is preserved.  IMPRESSION: No acute intracranial abnormality. Stable atrophy and chronic white matter disease.   Electronically Signed   By: Lahoma Crocker M.D.   On: 11/08/2014 14:06   Ct Head Wo Contrast  11/05/2014   CLINICAL DATA:  Fall, found on floor covered in urine at 0615 hours today dementia, LEFT hip pain, hypertension, Alzheimer's  EXAM: CT HEAD WITHOUT CONTRAST  CT CERVICAL SPINE WITHOUT CONTRAST  TECHNIQUE: Multidetector CT imaging of the head and cervical spine was performed following the standard protocol without intravenous contrast. Multiplanar CT image reconstructions of the cervical spine were also generated.  COMPARISON:  10/07/2014  FINDINGS: CT HEAD FINDINGS   Generalized atrophy.  Normal ventricular morphology.  No midline shift or mass effect.  Small vessel chronic ischemic changes of deep cerebral white matter.  No intracranial hemorrhage, mass lesion, or acute infarction.  Visualized paranasal sinuses and mastoid air cells clear.  Bones unremarkable.  CT CERVICAL SPINE FINDINGS  Beam hardening artifacts of dental origin and from LEFT shoulder prosthesis.  RIGHT thyroid mass 4.1 x 4.1 x 2.5 cm image 63.  Visualized skullbase intact.  Diffuse osseous demineralization.  Prevertebral soft tissues normal thickness.  Disc space narrowing with endplate spur formation at C5-C6.  Multilevel facet degenerative changes.  Vertebral body heights maintained without fracture or subluxation.  No bone destruction.  Lung apices clear.  IMPRESSION: Atrophy with small vessel chronic ischemic changes of deep cerebral white matter.  No acute intracranial abnormalities.  Degenerative disc and facet disease changes cervical spine.  No acute cervical spine abnormalities.  RIGHT thyroid mass 4.1 x 4.1 x 2.5 cm; dedicated thyroid ultrasound followup recommended.   Electronically Signed   By: Lavonia Dana M.D.   On: 11/05/2014 10:46   Ct Cervical Spine Wo Contrast  11/05/2014   CLINICAL DATA:  Fall, found on floor covered in urine at 0615 hours today dementia, LEFT hip pain, hypertension, Alzheimer's  EXAM: CT HEAD WITHOUT CONTRAST  CT CERVICAL SPINE WITHOUT CONTRAST  TECHNIQUE: Multidetector CT imaging of the head and cervical spine was performed following the standard protocol without intravenous contrast. Multiplanar CT image reconstructions of the cervical spine were also generated.  COMPARISON:  10/07/2014  FINDINGS: CT HEAD FINDINGS  Generalized atrophy.  Normal ventricular morphology.  No midline shift or mass effect.  Small vessel chronic ischemic changes of deep cerebral white matter.  No intracranial hemorrhage, mass lesion, or acute infarction.  Visualized paranasal sinuses and  mastoid air cells clear.  Bones unremarkable.  CT CERVICAL SPINE FINDINGS  Beam hardening artifacts of dental origin and from LEFT shoulder prosthesis.  RIGHT thyroid mass 4.1 x 4.1 x 2.5 cm image 63.  Visualized skullbase intact.  Diffuse osseous demineralization.  Prevertebral soft tissues normal thickness.  Disc space narrowing with endplate spur formation at C5-C6.  Multilevel facet degenerative changes.  Vertebral body heights maintained without fracture or subluxation.  No bone destruction.  Lung apices clear.  IMPRESSION: Atrophy with small vessel chronic ischemic changes of deep cerebral white matter.  No acute intracranial abnormalities.  Degenerative disc and facet disease changes cervical spine.  No acute cervical spine abnormalities.  RIGHT thyroid mass 4.1 x 4.1 x 2.5 cm; dedicated thyroid ultrasound followup recommended.   Electronically Signed   By: Lavonia Dana M.D.   On: 11/05/2014 10:46   US Soft Tissue Head/neck  11/07/2014   CLINICAL DATA:  81 year old female with a history of thyroid mass.  EXAM: THYROID ULTRASOUND  TECHNIQUE: Ultrasound examination of the thyroid gland and adjacent soft tissues was performed.  COMPARISON:  None.  FINDINGS: Right thyroid lobe  Measurements: 5.7 cm x 3.5 cm x 3.0 cm. Solid right thyroid nodule measures 4.0 cm x 2.6 cm x 2.6 cm  Left thyroid lobe  Measurements: 3.5 cm x 1.4 cm x 1.4 cm.  No nodules visualized.  Isthmus  Thickness: 4 mm.  No nodules visualized.  Lymphadenopathy  None visualized.  IMPRESSION: Single right-sided nodule which meets criteria for biopsy.  Ultrasound-guided fine needle aspiration should be considered, as per the consensus statement: Management of Thyroid Nodules Detected at [place]: Society of Radiologists in [place]. Radiology 2005; [id number].  Signed,  Dulcy Fanny. Earleen Newport, DO  [facility]  [facility]   Electronically Signed   By: Corrie Mckusick D.O.   On:  11/07/2014 08:17   Dg Pelvis Portable  11/27/2014   CLINICAL DATA:  Postop from revision of left hip prosthesis.  EXAM: PORTABLE PELVIS 1-2 VIEWS  COMPARISON:  11/07/2014  FINDINGS: Left hip prosthesis is seen in appropriate position. A probable fracture through the lesser trochanter is seen adjacent to the femoral component of the prosthesis. No evidence of dislocation.  Calcified fibroid again seen within the right pelvis.  IMPRESSION: Left hip prosthesis in appropriate position, with probable fracture through the lesser trochanter adjacent to the femoral component of the prosthesis.   Electronically Signed   By: Earle Gell M.D.   On: 11/27/2014 22:01   Pelvis Portable  11/07/2014   CLINICAL DATA:  Status post left hip replacement  EXAM: PORTABLE PELVIS 1-2 VIEWS  COMPARISON:  None.  FINDINGS: A left hip prosthesis is noted. No acute abnormality is noted. The pelvic ring is intact. A calcified uterine fibroid is seen.  IMPRESSION: Status post left hip replacement, no acute abnormality noted.   Electronically Signed   By: Inez Catalina M.D.   On: 11/07/2014 16:26   Ct Femur Left Wo Contrast  11/21/2014   CLINICAL DATA:  History of 2 recent falls in the past 2 weeks. Status post left hip arthroplasty 10/19/2014  EXAM: CT OF THE LEFT FEMUR WITHOUT CONTRAST  TECHNIQUE: Multidetector CT imaging was performed according to the standard protocol. Multiplanar CT image reconstructions were also generated.  COMPARISON:  Radiographs 11/06/2016  FINDINGS: There is a longitudinal split type fracture involving the upper left femur. The fracture extends from the anterior aspect of the greater trochanter and continues down into the subtrochanteric region of the upper femoral shaft posteriorly.  The prosthesis is intact.  The bipolar portion is normally located.  IMPRESSION: Mildly displaced longitudinal split type fracture involving the greater trochanter and upper  femoral shaft.  These results will be called to the  ordering clinician or representative by the Radiologist Assistant, and communication documented in the PACS or zVision Dashboard.   Electronically Signed   By: Marijo Sanes M.D.   On: 11/21/2014 15:12   Dg Chest Port 1 View  11/29/2014   CLINICAL DATA:  81 year old female with left femur fracture following hip arthroplasty in April. Status post hardware revision. Leukocytosis. Initial encounter.  EXAM: PORTABLE CHEST - 1 VIEW  COMPARISON:  11/09/2014 and earlier.  FINDINGS: Portable AP semi upright view at 0730 hours. Sequelae of left shoulder arthroplasty, left chest cardiac event recorder, and multilevel compression fracture augmentation re - identified. Calcified breast implants re - identified. Stable lung volumes. Stable cardiomegaly and mediastinal contours. No pneumothorax, pulmonary edema, pleural effusion or acute pulmonary opacity.  IMPRESSION: No acute cardiopulmonary abnormality.   Electronically Signed   By: Genevie Ann M.D.   On: 11/29/2014 08:00   Dg Chest Port 1 View  11/09/2014   CLINICAL DATA:  81 year old female with a history of left hip surgery.  EXAM: PORTABLE CHEST - 1 VIEW  COMPARISON:  11/05/2014, 10/07/2014  FINDINGS: Cardiomediastinal silhouette unchanged in size and contour. Atherosclerotic calcifications of the aortic arch.  Unchanged position of [place] monitor on the left chest wall.  Diffusely coarsened interstitial markings. No confluent airspace disease. No pneumothorax or large pleural effusion.  Partially imaged surgical changes of the left shoulder.  Vertebroplasty of 3 levels again noted.  Surgical changes of prior breast augmentation.  IMPRESSION: No evidence of lobar pneumonia with background of chronic lung changes.  Atherosclerosis.  Signed,  Dulcy Fanny. Earleen Newport, DO  [facility]  [facility]   Electronically Signed   By: Corrie Mckusick D.O.   On: 11/09/2014 10:25   Dg Hip Operative Unilat With Pelvis Left  11/27/2014   CLINICAL  DATA:  Revision arthroplasty of the left hip. Intraoperative imaging.  EXAM: OPERATIVE LEFT HIP (WITH PELVIS IF PERFORMED) 1 VIEW  TECHNIQUE: Fluoroscopic spot image(s) were submitted for interpretation post-operatively.  FLUOROSCOPY TIME:  Radiation Exposure Index (as provided by the fluoroscopic device):  If the device does not provide the exposure index:  Fluoroscopy Time:  0 minutes and 13 seconds  Number of Acquired Images:  1  COMPARISON:  11/07/2014  FINDINGS: The arthroplasty appears well seated and aligned on this single view. There is no acute fracture or evidence of an operative complication.  IMPRESSION: Well aligned left hip hemiarthroplasty.   Electronically Signed   By: Lajean Manes M.D.   On: 11/27/2014 15:01   Dg Hip Operative Unilat With Pelvis Left  11/07/2014   CLINICAL DATA:  Subcapital femoral neck fracture  EXAM: OPERATIVE LEFT HIP WITH PELVIS  COMPARISON:  None.  FLUOROSCOPY TIME:  Radiation Exposure Index (as provided by the fluoroscopic device): Not available  If the device does not provide the exposure index:  Fluoroscopy Time:  32 seconds  Number of Acquired Images:  Or  FINDINGS: The left subcapital femoral neck fracture is again noted left hip replacement was then performed. No acute abnormality is noted.  IMPRESSION: Status post left hip replacement   Electronically Signed   By: Inez Catalina M.D.   On: 11/07/2014 14:15   Dg Hip Unilat With Pelvis 2-3 Views Left  11/05/2014   CLINICAL DATA:  Status post fall at nursing home. Clinical shortening of the left leg with lateral rotation, severe pain  EXAM: LEFT HIP (WITH PELVIS) 2-3  VIEWS  COMPARISON:  None.  FINDINGS: The patient has sustained complete fracture through the subcapital region of the left hip. There is mild superior migration of the femur with respect to the femoral head. The bony pelvis is intact. There is a calcified fibroid demonstrated.  IMPRESSION: The patient has sustained an acute subcapital fracture of the left  hip with superior migration of the femur with respect to the femoral head.   Electronically Signed   By: David  Martinique   On: 11/05/2014 08:48     CBC  Recent Labs Lab 11/27/14 1108 11/27/14 2032 11/28/14 0500 11/29/14 0315 11/29/14 1455 11/30/14 0732  WBC 13.0* 16.2* 12.9* 14.1*  --  13.0*  HGB 10.5* 8.4* 7.4* 7.6* 8.9* 8.3*  HCT 35.1* 28.1* 24.4* 23.7* 28.0* 26.4*  PLT 468* 447* 429* 379  --  344  MCV 90.5 90.6 90.7 89.4  --  89.2  MCH 27.1 27.1 27.5 28.7  --  28.0  MCHC 29.9* 29.9* 30.3 32.1  --  31.4  RDW 14.6 14.8 14.9 15.0  --  15.3  LYMPHSABS 1.4  --   --   --   --   --   MONOABS 0.8  --   --   --   --   --   EOSABS 0.2  --   --   --   --   --   BASOSABS 0.0  --   --   --   --   --     Chemistries   Recent Labs Lab 11/27/14 1108 11/28/14 0500 11/30/14 0732  NA 141 140 137  K 4.7 5.1 4.2  CL 107 108 105  CO2 27 26 26   GLUCOSE 94 116* 93  BUN 23* 27* 20  CREATININE 1.13* 1.35* 0.98  CALCIUM 9.0 8.0* 8.1*  AST 24  --   --   ALT 17  --   --   ALKPHOS 153*  --   --   BILITOT 0.6  --   --    ------------------------------------------------------------------------------------------------------------------ estimated creatinine clearance is 39.8 mL/min (by C-G formula based on Cr of 0.98). ------------------------------------------------------------------------------------------------------------------ No results for input(s): HGBA1C in the last 72 hours. ------------------------------------------------------------------------------------------------------------------ No results for input(s): CHOL, HDL, LDLCALC, TRIG, CHOLHDL, LDLDIRECT in the last 72 hours. ------------------------------------------------------------------------------------------------------------------ No results for input(s): TSH, T4TOTAL, T3FREE, THYROIDAB in the last 72 hours.  Invalid input(s):  FREET3 ------------------------------------------------------------------------------------------------------------------ No results for input(s): VITAMINB12, FOLATE, FERRITIN, TIBC, IRON, RETICCTPCT in the last 72 hours.  Coagulation profile  Recent Labs Lab 11/27/14 1108  INR 1.02    No results for input(s): DDIMER in the last 72 hours.  Cardiac Enzymes No results for input(s): CKMB, TROPONINI, MYOGLOBIN in the last 168 hours.  Invalid input(s): CK ------------------------------------------------------------------------------------------------------------------ Invalid input(s): POCBNP     Time Spent in minutes   25 minutes   ELGERGAWY, DAWOOD M.D on 11/30/2014 at 11:56 AM  Between 7am to 7pm - Pager - [phone]  After 7pm go to [email or web address] - password [facility]  [facility]   Office  [phone]

## 2014-11-30 NOTE — Progress Notes (Signed)
Subjective: 3 Days Post-Op Procedure(s) (LRB): REVISION OF LEFT HIP REPLACEMENT (Left) Patient reports pain as moderate.   No complaints. Objective: Vital signs in last 24 hours: Temp:  [98.6 F (37 C)-99.7 F (37.6 C)] 98.7 F (37.1 C) (05/14 0551) Pulse Rate:  [106-121] 118 (05/14 0551) Resp:  [18] 18 (05/14 0551) BP: (117-128)/(39-57) 124/47 mmHg (05/14 0551) SpO2:  [92 %-95 %] 94 % (05/14 0551)  Intake/Output from previous day: 05/13 0701 - 05/14 0700 In: 335 [Blood:335] Out: -  Intake/Output this shift:     Recent Labs  11/27/14 2032 11/28/14 0500 11/29/14 0315 11/29/14 1455 11/30/14 0732  HGB 8.4* 7.4* 7.6* 8.9* 8.3*    Recent Labs  11/29/14 0315 11/29/14 1455 11/30/14 0732  WBC 14.1*  --  13.0*  RBC 2.65*  --  2.96*  HCT 23.7* 28.0* 26.4*  PLT 379  --  344    Recent Labs  11/28/14 0500 11/30/14 0732  NA 140 137  K 5.1 4.2  CL 108 105  CO2 26 26  BUN 27* 20  CREATININE 1.35* 0.98  GLUCOSE 116* 93  CALCIUM 8.0* 8.1*    Recent Labs  11/27/14 1108  INR 1.02   Left hip: Sensation intact distally Intact pulses distally Dorsiflexion/Plantar flexion intact Incision: scant drainage Compartment soft  Assessment/Plan: 3 Days Post-Op Procedure(s) (LRB): REVISION OF LEFT HIP REPLACEMENT (Left) Up with therapy WBAT left lower extremity Monitor HgB and for symptoms of anemia Dispo SNF early next week  Secily Walthour 11/30/2014, 9:12 AM

## 2014-12-01 DIAGNOSIS — M1990 Unspecified osteoarthritis, unspecified site: Secondary | ICD-10-CM

## 2014-12-01 LAB — CBC
HEMATOCRIT: 26.5 % — AB (ref 36.0–46.0)
HEMOGLOBIN: 8.2 g/dL — AB (ref 12.0–15.0)
MCH: 28.2 pg (ref 26.0–34.0)
MCHC: 30.9 g/dL (ref 30.0–36.0)
MCV: 91.1 fL (ref 78.0–100.0)
PLATELETS: 371 10*3/uL (ref 150–400)
RBC: 2.91 MIL/uL — ABNORMAL LOW (ref 3.87–5.11)
RDW: 15.4 % (ref 11.5–15.5)
WBC: 11.4 10*3/uL — ABNORMAL HIGH (ref 4.0–10.5)

## 2014-12-01 LAB — URINE CULTURE: Colony Count: 7000

## 2014-12-01 LAB — POCT I-STAT 4, (NA,K, GLUC, HGB,HCT)
Glucose, Bld: 91 mg/dL (ref 65–99)
HCT: 28 % — ABNORMAL LOW (ref 36.0–46.0)
HEMOGLOBIN: 9.5 g/dL — AB (ref 12.0–15.0)
Potassium: 4.9 mmol/L (ref 3.5–5.1)
Sodium: 139 mmol/L (ref 135–145)

## 2014-12-01 MED ORDER — ENOXAPARIN SODIUM 40 MG/0.4ML ~~LOC~~ SOLN
40.0000 mg | SUBCUTANEOUS | Status: DC
  Administered 2014-12-02: 40 mg via SUBCUTANEOUS
  Filled 2014-12-01: qty 0.4

## 2014-12-01 NOTE — Progress Notes (Signed)
Patient Demographics  Laura [name], is a 81 y.o. female, DOB - [birth year], STM:[id number]  Admit date - 11/27/2014   Admitting Physician Naiping Ephriam Jenkins, MD  Outpatient Primary MD for the patient is Sande Brothers, MD  LOS - 4   No chief complaint on file.                                                            consult follow-up note  Admission HPI/Brief narrative: 81 year old with PMH significant for Thryroid mass, COPD emphysema, dementia, LBBB, recent hip fracture S/P repair 2 week ag was admitted on 5-11 for Revision of left hip arthroplasty of the femoral component. Medicine consulted to help with medical management.    Subjective:   [place] today has, No headache, No chest pain, No abdominal pain - No Nausea, No new weakness tingling or numbness, No Cough - SOB. Still reports constipation  Assessment & Plan    Active Problems:   Periprosthetic fracture around internal prosthetic left hip joint   Hip arthritis  Left hip periprosthetic fracture - S/P Revision of left hip arthroplasty of the femoral component. Post operative management per ortho.  - Management as per orthopedics.  Alzheimer's dementia;  - at risk for delirium, limit narcotics as possible.   UTI - Started on Rocephin 5/12, stopped 5/14, as finished 3 days. - Wound cultures growing gram-negative rods -Improving leukocytosis  COPD and emphysema - No active wheezing Continue with Albuterol PRN>   Thyroid mass incidentally noted on CT:  - Need repeat thyroid ultrasound in 6 months.   Anemia;  - Postop acute blood loss anemia, transfused total of 2 units during hospital stay on 5/12, 5/13, recheck CBC in a.m. to ensure hemoglobin is stable, - Hemoccult negative   DVT prophylaxis per Ortho.    Code Status: Full  Family Communication: None at bedside     Procedures  Revision of left hip  arthroplasty of the femoral component One unit packed red blood cell transfusion 5/12 , and 5/13    Medications  Scheduled Meds: . sodium chloride   Intravenous Once  . sodium chloride   Intravenous Once  . bisacodyl  10 mg Rectal BID  . cholecalciferol  1,000 Units Oral Q breakfast  . [START ON 12/02/2014] enoxaparin (LOVENOX) injection  40 mg Subcutaneous Q24H  . lactose free nutrition  237 mL Oral BID BM  . meloxicam  7.5 mg Oral Q breakfast  . pantoprazole  40 mg Oral Daily  . PARoxetine  40 mg Oral Daily  . polyethylene glycol  17 g Oral BID  . senna-docusate  1 tablet Oral QHS  . temazepam  7.5 mg Oral QHS   Continuous Infusions: . lactated ringers 10 mL/hr at 11/27/14 1111   PRN Meds:.acetaminophen **OR** acetaminophen, albuterol, alum & mag hydroxide-simeth, baclofen, diphenhydrAMINE, guaifenesin, loperamide, magnesium hydroxide, menthol-cetylpyridinium **OR** phenol, metoCLOPramide **OR** metoCLOPramide (REGLAN) injection, morphine injection, ondansetron **OR** ondansetron (ZOFRAN) IV, oxyCODONE, polyethylene glycol, sorbitol  DVT Prophylaxis  Lovenox -  Lab Results  Component Value Date  PLT 371 12/01/2014    Antibiotics   Anti-infectives    Start     Dose/Rate Route Frequency Ordered Stop   11/28/14 0800  cefTRIAXone (ROCEPHIN) 1 g in dextrose 5 % 50 mL IVPB - Premix  Status:  Discontinued     1 g 100 mL/hr over 30 Minutes Intravenous Daily 11/28/14 0708 11/30/14 1201   11/27/14 1930  ceFAZolin (ANCEF) IVPB 2 g/50 mL premix     2 g 100 mL/hr over 30 Minutes Intravenous Every 6 hours 11/27/14 1806 11/28/14 0337   11/27/14 1100  ceFAZolin (ANCEF) IVPB 2 g/50 mL premix     2 g 100 mL/hr over 30 Minutes Intravenous To Surgery 11/26/14 1408 11/27/14 1327          Objective:   Filed Vitals:   11/30/14 0551 11/30/14 1315 11/30/14 2122 12/01/14 0453  BP: 124/47 114/47 131/57 120/66  Pulse: 118 105 110 108  Temp: 98.7 F (37.1 C) 99.1 F (37.3 C) 98.6 F  (37 C) 98.7 F (37.1 C)  TempSrc:   Oral Oral  Resp: 18 18 18 18   Height:      Weight:      SpO2: 94% 96% 93% 91%    Wt Readings from Last 3 Encounters:  11/27/14 73.483 kg (162 lb)  11/13/14 73.573 kg (162 lb 3.2 oz)  10/25/14 74.9 kg (165 lb 2 oz)     Intake/Output Summary (Last 24 hours) at 12/01/14 1148 Last data filed at 12/01/14 0900  Gross per 24 hour  Intake    810 ml  Output      0 ml  Net    810 ml     Physical Exam  Awake Alert, pleasant, No new F.N deficits, Normal affect [place].AT,PERRAL Supple Neck,No JVD, No cervical lymphadenopathy appriciated.  Symmetrical Chest wall movement, Good air movement bilaterally, CTAB RRR,No Gallops,Rubs or new Murmurs, No Parasternal Heave +ve B.Sounds, Abd Soft, No tenderness, No organomegaly appriciated, No rebound - guarding or rigidity. No Cyanosis, Clubbing or edema, pulses felt bilaterally.   Data Review   Micro Results Recent Results (from the past 240 hour(s))  Urine culture     Status: None (Preliminary result)   Collection Time: 11/27/14 11:06 PM  Result Value Ref Range Status   Specimen Description URINE, CATHETERIZED  Final   Special Requests NONE  Final   Colony Count   Final    7,000 COLONIES/ML Performed at [facility]    Culture   Final    [place] Performed at [facility]    Report Status PENDING  Incomplete    Radiology Reports Dg Chest 2 View  11/05/2014   CLINICAL DATA:  [place] at nursing home, shortness of breath, preoperative assessment  EXAM: CHEST  2 VIEW  COMPARISON:  10/07/2014  FINDINGS: Loop recorder projects over LEFT chest.  Enlargement of cardiac silhouette with pulmonary vascular congestion.  Atherosclerotic calcification aorta.  Emphysematous and bronchitic changes consistent with COPD.  Chronic accentuation of perihilar markings unchanged.  No gross infiltrate, pleural effusion or pneumothorax.  Calcified BILATERAL breast prostheses.  Marked osseous  demineralization with multiple prior spinal augmentation procedures.  LEFT shoulder prosthesis.  IMPRESSION: COPD changes.  No acute abnormalities.   Electronically Signed   By: Lavonia Dana M.D.   On: 11/05/2014 08:48   Dg Knee 2 Views Left  11/05/2014   CLINICAL DATA:  Left leg foreshortened.  Left knee pain.  Fall.  EXAM: LEFT KNEE - 1-2 VIEW  COMPARISON:  None.  FINDINGS: Degenerative changes in the left knee with joint space narrowing, spurring, most pronounced in the lateral compartment. No fracture, subluxation or dislocation. Diffuse osteopenia. Soft tissues are intact and unremarkable.  IMPRESSION: Tricompartment degenerative changes.  No acute bony abnormality.   Electronically Signed   By: Rolm Baptise M.D.   On: 11/05/2014 08:49   Ct Head Wo Contrast  11/08/2014   CLINICAL DATA:  Dementia  EXAM: CT HEAD WITHOUT CONTRAST  TECHNIQUE: Contiguous axial images were obtained from the base of the skull through the vertex without intravenous contrast.  COMPARISON:  4/19/ 16  FINDINGS: No skull fracture is noted. Paranasal sinuses and mastoid air cells are unremarkable. Stable atrophy. Stable periventricular and patchy subcortical chronic white matter disease. Atherosclerotic calcifications of carotid siphon again noted. No acute cortical infarction. No mass lesion is noted on this unenhanced scan. The gray and white-matter differentiation is preserved.  IMPRESSION: No acute intracranial abnormality. Stable atrophy and chronic white matter disease.   Electronically Signed   By: Lahoma Crocker M.D.   On: 11/08/2014 14:06   Ct Head Wo Contrast  11/05/2014   CLINICAL DATA:  Fall, found on floor covered in urine at 0615 hours today dementia, LEFT hip pain, hypertension, Alzheimer's  EXAM: CT HEAD WITHOUT CONTRAST  CT CERVICAL SPINE WITHOUT CONTRAST  TECHNIQUE: Multidetector CT imaging of the head and cervical spine was performed following the standard protocol without intravenous contrast. Multiplanar CT image  reconstructions of the cervical spine were also generated.  COMPARISON:  10/07/2014  FINDINGS: CT HEAD FINDINGS  Generalized atrophy.  Normal ventricular morphology.  No midline shift or mass effect.  Small vessel chronic ischemic changes of deep cerebral white matter.  No intracranial hemorrhage, mass lesion, or acute infarction.  Visualized paranasal sinuses and mastoid air cells clear.  Bones unremarkable.  CT CERVICAL SPINE FINDINGS  Beam hardening artifacts of dental origin and from LEFT shoulder prosthesis.  RIGHT thyroid mass 4.1 x 4.1 x 2.5 cm image 63.  Visualized skullbase intact.  Diffuse osseous demineralization.  Prevertebral soft tissues normal thickness.  Disc space narrowing with endplate spur formation at C5-C6.  Multilevel facet degenerative changes.  Vertebral body heights maintained without fracture or subluxation.  No bone destruction.  Lung apices clear.  IMPRESSION: Atrophy with small vessel chronic ischemic changes of deep cerebral white matter.  No acute intracranial abnormalities.  Degenerative disc and facet disease changes cervical spine.  No acute cervical spine abnormalities.  RIGHT thyroid mass 4.1 x 4.1 x 2.5 cm; dedicated thyroid ultrasound followup recommended.   Electronically Signed   By: Lavonia Dana M.D.   On: 11/05/2014 10:46   Ct Cervical Spine Wo Contrast  11/05/2014   CLINICAL DATA:  Fall, found on floor covered in urine at 0615 hours today dementia, LEFT hip pain, hypertension, Alzheimer's  EXAM: CT HEAD WITHOUT CONTRAST  CT CERVICAL SPINE WITHOUT CONTRAST  TECHNIQUE: Multidetector CT imaging of the head and cervical spine was performed following the standard protocol without intravenous contrast. Multiplanar CT image reconstructions of the cervical spine were also generated.  COMPARISON:  10/07/2014  FINDINGS: CT HEAD FINDINGS  Generalized atrophy.  Normal ventricular morphology.  No midline shift or mass effect.  Small vessel chronic ischemic changes of deep cerebral  white matter.  No intracranial hemorrhage, mass lesion, or acute infarction.  Visualized paranasal sinuses and mastoid air cells clear.  Bones unremarkable.  CT CERVICAL SPINE FINDINGS  Beam hardening artifacts of dental origin and from  LEFT shoulder prosthesis.  RIGHT thyroid mass 4.1 x 4.1 x 2.5 cm image 63.  Visualized skullbase intact.  Diffuse osseous demineralization.  Prevertebral soft tissues normal thickness.  Disc space narrowing with endplate spur formation at C5-C6.  Multilevel facet degenerative changes.  Vertebral body heights maintained without fracture or subluxation.  No bone destruction.  Lung apices clear.  IMPRESSION: Atrophy with small vessel chronic ischemic changes of deep cerebral white matter.  No acute intracranial abnormalities.  Degenerative disc and facet disease changes cervical spine.  No acute cervical spine abnormalities.  RIGHT thyroid mass 4.1 x 4.1 x 2.5 cm; dedicated thyroid ultrasound followup recommended.   Electronically Signed   By: Lavonia Dana M.D.   On: 11/05/2014 10:46   US Soft Tissue Head/neck  11/07/2014   CLINICAL DATA:  81 year old female with a history of thyroid mass.  EXAM: THYROID ULTRASOUND  TECHNIQUE: Ultrasound examination of the thyroid gland and adjacent soft tissues was performed.  COMPARISON:  None.  FINDINGS: Right thyroid lobe  Measurements: 5.7 cm x 3.5 cm x 3.0 cm. Solid right thyroid nodule measures 4.0 cm x 2.6 cm x 2.6 cm  Left thyroid lobe  Measurements: 3.5 cm x 1.4 cm x 1.4 cm.  No nodules visualized.  Isthmus  Thickness: 4 mm.  No nodules visualized.  Lymphadenopathy  None visualized.  IMPRESSION: Single right-sided nodule which meets criteria for biopsy.  Ultrasound-guided fine needle aspiration should be considered, as per the consensus statement: Management of Thyroid Nodules Detected at [place]: Society of Radiologists in [place]. Radiology 2005; [id number].  Signed,  Dulcy Fanny. Earleen Newport, DO  [facility]  [facility]   Electronically Signed   By: Corrie Mckusick D.O.   On: 11/07/2014 08:17   Dg Pelvis Portable  11/27/2014   CLINICAL DATA:  Postop from revision of left hip prosthesis.  EXAM: PORTABLE PELVIS 1-2 VIEWS  COMPARISON:  11/07/2014  FINDINGS: Left hip prosthesis is seen in appropriate position. A probable fracture through the lesser trochanter is seen adjacent to the femoral component of the prosthesis. No evidence of dislocation.  Calcified fibroid again seen within the right pelvis.  IMPRESSION: Left hip prosthesis in appropriate position, with probable fracture through the lesser trochanter adjacent to the femoral component of the prosthesis.   Electronically Signed   By: Earle Gell M.D.   On: 11/27/2014 22:01   Pelvis Portable  11/07/2014   CLINICAL DATA:  Status post left hip replacement  EXAM: PORTABLE PELVIS 1-2 VIEWS  COMPARISON:  None.  FINDINGS: A left hip prosthesis is noted. No acute abnormality is noted. The pelvic ring is intact. A calcified uterine fibroid is seen.  IMPRESSION: Status post left hip replacement, no acute abnormality noted.   Electronically Signed   By: Inez Catalina M.D.   On: 11/07/2014 16:26   Ct Femur Left Wo Contrast  11/21/2014   CLINICAL DATA:  History of 2 recent falls in the past 2 weeks. Status post left hip arthroplasty 10/19/2014  EXAM: CT OF THE LEFT FEMUR WITHOUT CONTRAST  TECHNIQUE: Multidetector CT imaging was performed according to the standard protocol. Multiplanar CT image reconstructions were also generated.  COMPARISON:  Radiographs 11/06/2016  FINDINGS: There is a longitudinal split type fracture involving the upper left femur. The fracture extends from the anterior aspect of the greater trochanter and continues down into the subtrochanteric region of the upper femoral shaft posteriorly.  The prosthesis is intact.  The bipolar portion is  normally located.  IMPRESSION: Mildly displaced longitudinal split  type fracture involving the greater trochanter and upper femoral shaft.  These results will be called to the ordering clinician or representative by the Radiologist Assistant, and communication documented in the PACS or zVision Dashboard.   Electronically Signed   By: Marijo Sanes M.D.   On: 11/21/2014 15:12   Dg Chest Port 1 View  11/29/2014   CLINICAL DATA:  81 year old female with left femur fracture following hip arthroplasty in April. Status post hardware revision. Leukocytosis. Initial encounter.  EXAM: PORTABLE CHEST - 1 VIEW  COMPARISON:  11/09/2014 and earlier.  FINDINGS: Portable AP semi upright view at 0730 hours. Sequelae of left shoulder arthroplasty, left chest cardiac event recorder, and multilevel compression fracture augmentation re - identified. Calcified breast implants re - identified. Stable lung volumes. Stable cardiomegaly and mediastinal contours. No pneumothorax, pulmonary edema, pleural effusion or acute pulmonary opacity.  IMPRESSION: No acute cardiopulmonary abnormality.   Electronically Signed   By: Genevie Ann M.D.   On: 11/29/2014 08:00   Dg Chest Port 1 View  11/09/2014   CLINICAL DATA:  82 year old female with a history of left hip surgery.  EXAM: PORTABLE CHEST - 1 VIEW  COMPARISON:  11/05/2014, 10/07/2014  FINDINGS: Cardiomediastinal silhouette unchanged in size and contour. Atherosclerotic calcifications of the aortic arch.  Unchanged position of Halter monitor on the left chest wall.  Diffusely coarsened interstitial markings. No confluent airspace disease. No pneumothorax or large pleural effusion.  Partially imaged surgical changes of the left shoulder.  Vertebroplasty of 3 levels again noted.  Surgical changes of prior breast augmentation.  IMPRESSION: No evidence of lobar pneumonia with background of chronic lung changes.  Atherosclerosis.  Signed,  Dulcy Fanny. Earleen Newport, DO  [facility]  [facility]   Electronically Signed   By:  Corrie Mckusick D.O.   On: 11/09/2014 10:25   Dg Hip Operative Unilat With Pelvis Left  11/27/2014   CLINICAL DATA:  Revision arthroplasty of the left hip. Intraoperative imaging.  EXAM: OPERATIVE LEFT HIP (WITH PELVIS IF PERFORMED) 1 VIEW  TECHNIQUE: Fluoroscopic spot image(s) were submitted for interpretation post-operatively.  FLUOROSCOPY TIME:  Radiation Exposure Index (as provided by the fluoroscopic device):  If the device does not provide the exposure index:  Fluoroscopy Time:  0 minutes and 13 seconds  Number of Acquired Images:  1  COMPARISON:  11/07/2014  FINDINGS: The arthroplasty appears well seated and aligned on this single view. There is no acute fracture or evidence of an operative complication.  IMPRESSION: Well aligned left hip hemiarthroplasty.   Electronically Signed   By: Lajean Manes M.D.   On: 11/27/2014 15:01   Dg Hip Operative Unilat With Pelvis Left  11/07/2014   CLINICAL DATA:  Subcapital femoral neck fracture  EXAM: OPERATIVE LEFT HIP WITH PELVIS  COMPARISON:  None.  FLUOROSCOPY TIME:  Radiation Exposure Index (as provided by the fluoroscopic device): Not available  If the device does not provide the exposure index:  Fluoroscopy Time:  32 seconds  Number of Acquired Images:  Or  FINDINGS: The left subcapital femoral neck fracture is again noted left hip replacement was then performed. No acute abnormality is noted.  IMPRESSION: Status post left hip replacement   Electronically Signed   By: Inez Catalina M.D.   On: 11/07/2014 14:15   Dg Hip Unilat With Pelvis 2-3 Views Left  11/05/2014   CLINICAL DATA:  Status post fall at nursing home. Clinical shortening  of the left leg with lateral rotation, severe pain  EXAM: LEFT HIP (WITH PELVIS) 2-3 VIEWS  COMPARISON:  None.  FINDINGS: The patient has sustained complete fracture through the subcapital region of the left hip. There is mild superior migration of the femur with respect to the femoral head. The bony pelvis is intact. There is a  calcified fibroid demonstrated.  IMPRESSION: The patient has sustained an acute subcapital fracture of the left hip with superior migration of the femur with respect to the femoral head.   Electronically Signed   By: David  Martinique   On: 11/05/2014 08:48     CBC  Recent Labs Lab 11/27/14 1108  11/27/14 2032 11/28/14 0500 11/29/14 0315 11/29/14 1455 11/30/14 0732 12/01/14 0604  WBC 13.0*  --  16.2* 12.9* 14.1*  --  13.0* 11.4*  HGB 10.5*  < > 8.4* 7.4* 7.6* 8.9* 8.3* 8.2*  HCT 35.1*  < > 28.1* 24.4* 23.7* 28.0* 26.4* 26.5*  PLT 468*  --  447* 429* 379  --  344 371  MCV 90.5  --  90.6 90.7 89.4  --  89.2 91.1  MCH 27.1  --  27.1 27.5 28.7  --  28.0 28.2  MCHC 29.9*  --  29.9* 30.3 32.1  --  31.4 30.9  RDW 14.6  --  14.8 14.9 15.0  --  15.3 15.4  LYMPHSABS 1.4  --   --   --   --   --   --   --   MONOABS 0.8  --   --   --   --   --   --   --   EOSABS 0.2  --   --   --   --   --   --   --   BASOSABS 0.0  --   --   --   --   --   --   --   < > = values in this interval not displayed.  Chemistries   Recent Labs Lab 11/27/14 1108 11/27/14 1442 11/28/14 0500 11/30/14 0732  NA 141 139 140 137  K 4.7 4.9 5.1 4.2  CL 107  --  108 105  CO2 27  --  26 26  GLUCOSE 94 91 116* 93  BUN 23*  --  27* 20  CREATININE 1.13*  --  1.35* 0.98  CALCIUM 9.0  --  8.0* 8.1*  AST 24  --   --   --   ALT 17  --   --   --   ALKPHOS 153*  --   --   --   BILITOT 0.6  --   --   --    ------------------------------------------------------------------------------------------------------------------ estimated creatinine clearance is 39.8 mL/min (by C-G formula based on Cr of 0.98). ------------------------------------------------------------------------------------------------------------------ No results for input(s): HGBA1C in the last 72 hours. ------------------------------------------------------------------------------------------------------------------ No results for input(s): CHOL, HDL,  LDLCALC, TRIG, CHOLHDL, LDLDIRECT in the last 72 hours. ------------------------------------------------------------------------------------------------------------------ No results for input(s): TSH, T4TOTAL, T3FREE, THYROIDAB in the last 72 hours.  Invalid input(s): FREET3 ------------------------------------------------------------------------------------------------------------------ No results for input(s): VITAMINB12, FOLATE, FERRITIN, TIBC, IRON, RETICCTPCT in the last 72 hours.  Coagulation profile  Recent Labs Lab 11/27/14 1108  INR 1.02    No results for input(s): DDIMER in the last 72 hours.  Cardiac Enzymes No results for input(s): CKMB, TROPONINI, MYOGLOBIN in the last 168 hours.  Invalid input(s): CK ------------------------------------------------------------------------------------------------------------------ Invalid input(s): POCBNP     Time Spent in minutes  25 minutes   Hiroko Tregre M.D on 12/01/2014 at 11:48 AM  Between 7am to 7pm - Pager - [phone]  After 7pm go to [email or web address] - password [facility]  [facility]   Office  [phone]

## 2014-12-01 NOTE — Progress Notes (Signed)
Per PA note- d/c is not indicated today as post op anemia continues to require stabilization.  Plan d/c early this week per note.  Will be placed at [facility] when stable.  CSW will continue to follow as assist as indicated.  Lorie Phenix. Pauline Good, [place]

## 2014-12-01 NOTE — Progress Notes (Signed)
Subjective: 4 Days Post-Op Procedure(s) (LRB): REVISION OF LEFT HIP REPLACEMENT (Left) Patient reports pain as mild.  Poor appetite otherwise no complaints.  Objective: Vital signs in last 24 hours: Temp:  [98.6 F (37 C)-99.1 F (37.3 C)] 98.7 F (37.1 C) (05/15 0453) Pulse Rate:  [105-110] 108 (05/15 0453) Resp:  [18] 18 (05/15 0453) BP: (114-131)/(47-66) 120/66 mmHg (05/15 0453) SpO2:  [91 %-96 %] 91 % (05/15 0453)  Intake/Output from previous day: 05/14 0701 - 05/15 0700 In: 810 [P.O.:810] Out: -  Intake/Output this shift:     Recent Labs  11/29/14 0315 11/29/14 1455 11/30/14 0732 12/01/14 0604  HGB 7.6* 8.9* 8.3* 8.2*    Recent Labs  11/30/14 0732 12/01/14 0604  WBC 13.0* 11.4*  RBC 2.96* 2.91*  HCT 26.4* 26.5*  PLT 344 371    Recent Labs  11/30/14 0732  NA 137  K 4.2  CL 105  CO2 26  BUN 20  CREATININE 0.98  GLUCOSE 93  CALCIUM 8.1*   No results for input(s): LABPT, INR in the last 72 hours.  Sensation intact distally Intact pulses distally Incision: dressing C/D/I Compartment soft  Assessment/Plan: 4 Days Post-Op Procedure(s) (LRB): REVISION OF LEFT HIP REPLACEMENT (Left) Discharge to SNF early this week Post op anemia appears to be stabilizing no symptoms OOB with PT  CLARK, GILBERT 12/01/2014, 9:18 AM

## 2014-12-02 ENCOUNTER — Encounter (HOSPITAL_COMMUNITY): Payer: Self-pay | Admitting: Orthopaedic Surgery

## 2014-12-02 DIAGNOSIS — E079 Disorder of thyroid, unspecified: Secondary | ICD-10-CM | POA: Diagnosis not present

## 2014-12-02 DIAGNOSIS — R278 Other lack of coordination: Secondary | ICD-10-CM | POA: Diagnosis not present

## 2014-12-02 DIAGNOSIS — R2681 Unsteadiness on feet: Secondary | ICD-10-CM | POA: Diagnosis not present

## 2014-12-02 DIAGNOSIS — F419 Anxiety disorder, unspecified: Secondary | ICD-10-CM | POA: Diagnosis not present

## 2014-12-02 DIAGNOSIS — R41841 Cognitive communication deficit: Secondary | ICD-10-CM | POA: Diagnosis not present

## 2014-12-02 DIAGNOSIS — Z9181 History of falling: Secondary | ICD-10-CM | POA: Diagnosis not present

## 2014-12-02 DIAGNOSIS — Z96642 Presence of left artificial hip joint: Secondary | ICD-10-CM | POA: Diagnosis not present

## 2014-12-02 DIAGNOSIS — F0280 Dementia in other diseases classified elsewhere without behavioral disturbance: Secondary | ICD-10-CM | POA: Diagnosis not present

## 2014-12-02 DIAGNOSIS — T84041A Periprosthetic fracture around internal prosthetic left hip joint, initial encounter: Secondary | ICD-10-CM | POA: Diagnosis not present

## 2014-12-02 DIAGNOSIS — E559 Vitamin D deficiency, unspecified: Secondary | ICD-10-CM | POA: Diagnosis not present

## 2014-12-02 DIAGNOSIS — E46 Unspecified protein-calorie malnutrition: Secondary | ICD-10-CM | POA: Diagnosis not present

## 2014-12-02 DIAGNOSIS — M1990 Unspecified osteoarthritis, unspecified site: Secondary | ICD-10-CM | POA: Diagnosis not present

## 2014-12-02 DIAGNOSIS — M6281 Muscle weakness (generalized): Secondary | ICD-10-CM | POA: Diagnosis not present

## 2014-12-02 DIAGNOSIS — I10 Essential (primary) hypertension: Secondary | ICD-10-CM | POA: Diagnosis not present

## 2014-12-02 DIAGNOSIS — J449 Chronic obstructive pulmonary disease, unspecified: Secondary | ICD-10-CM | POA: Diagnosis not present

## 2014-12-02 DIAGNOSIS — R1312 Dysphagia, oropharyngeal phase: Secondary | ICD-10-CM | POA: Diagnosis not present

## 2014-12-02 DIAGNOSIS — J439 Emphysema, unspecified: Secondary | ICD-10-CM | POA: Diagnosis not present

## 2014-12-02 DIAGNOSIS — G9340 Encephalopathy, unspecified: Secondary | ICD-10-CM | POA: Diagnosis not present

## 2014-12-02 DIAGNOSIS — G309 Alzheimer's disease, unspecified: Secondary | ICD-10-CM | POA: Diagnosis not present

## 2014-12-02 DIAGNOSIS — G4700 Insomnia, unspecified: Secondary | ICD-10-CM | POA: Diagnosis not present

## 2014-12-02 DIAGNOSIS — I447 Left bundle-branch block, unspecified: Secondary | ICD-10-CM | POA: Diagnosis not present

## 2014-12-02 DIAGNOSIS — Z471 Aftercare following joint replacement surgery: Secondary | ICD-10-CM | POA: Diagnosis not present

## 2014-12-02 DIAGNOSIS — M21252 Flexion deformity, left hip: Secondary | ICD-10-CM | POA: Diagnosis not present

## 2014-12-02 DIAGNOSIS — E875 Hyperkalemia: Secondary | ICD-10-CM | POA: Diagnosis not present

## 2014-12-02 DIAGNOSIS — M25552 Pain in left hip: Secondary | ICD-10-CM | POA: Diagnosis not present

## 2014-12-02 DIAGNOSIS — F329 Major depressive disorder, single episode, unspecified: Secondary | ICD-10-CM | POA: Diagnosis not present

## 2014-12-02 DIAGNOSIS — D62 Acute posthemorrhagic anemia: Secondary | ICD-10-CM | POA: Diagnosis not present

## 2014-12-02 DIAGNOSIS — K5901 Slow transit constipation: Secondary | ICD-10-CM | POA: Diagnosis not present

## 2014-12-02 DIAGNOSIS — T84048S Periprosthetic fracture around other internal prosthetic joint, sequela: Secondary | ICD-10-CM | POA: Diagnosis not present

## 2014-12-02 LAB — CBC
HCT: 29.2 % — ABNORMAL LOW (ref 36.0–46.0)
Hemoglobin: 8.9 g/dL — ABNORMAL LOW (ref 12.0–15.0)
MCH: 28 pg (ref 26.0–34.0)
MCHC: 30.5 g/dL (ref 30.0–36.0)
MCV: 91.8 fL (ref 78.0–100.0)
PLATELETS: 381 10*3/uL (ref 150–400)
RBC: 3.18 MIL/uL — AB (ref 3.87–5.11)
RDW: 15.2 % (ref 11.5–15.5)
WBC: 8.5 10*3/uL (ref 4.0–10.5)

## 2014-12-02 MED ORDER — ENOXAPARIN SODIUM 40 MG/0.4ML ~~LOC~~ SOLN: 40.0000 mg | Freq: Every day | SUBCUTANEOUS | Status: DC

## 2014-12-02 MED ORDER — HYDROCODONE-ACETAMINOPHEN 7.5-325 MG PO TABS: 1.0000 | ORAL_TABLET | Freq: Four times a day (QID) | ORAL | Status: DC | PRN

## 2014-12-02 NOTE — Progress Notes (Signed)
Hgb stable. Stable for discharge today Dressing c/d/i  N. Eduard Roux, MD [place] 10:48 AM

## 2014-12-02 NOTE — Progress Notes (Signed)
Physical Therapy Treatment Patient Details Name: Laura [name] MRN: [id number] DOB: [birth year] Today's Date: 12/02/2014    History of Present Illness Patient is a 81 y/o female s/p left hip revision after fall at dementia unit. PMH of Lt hip hemiarthroplasty 11/07/2014, HTN, Alzhemier's disease, anxiety, depression and COPD.    PT Comments    Patient able to transfer from bed to recliner with +2 mod A for safety and balance. Patient refused use of RW stating, "it just gets in the way". Patient soiled in bed upon entering room and required A for mobility for pericare. Patient pleasantly confused throughout session. Continue to recommend SNF for ongoing Physical Therapy.     Follow Up Recommendations  SNF;Supervision/Assistance - 24 hour     Equipment Recommendations  None recommended by PT    Recommendations for Other Services       Precautions / Restrictions Precautions Precautions: Fall Restrictions Weight Bearing Restrictions: Yes LLE Weight Bearing: Weight bearing as tolerated    Mobility  Bed Mobility Overal bed mobility: Needs Assistance Bed Mobility: Supine to Sit     Supine to sit: Mod assist;HOB elevated     General bed mobility comments: Assistance for management of BLEs. Multimodal cues for trunk support and to scoot EOB.   Transfers Overall transfer level: Needs assistance Equipment used: Rolling walker (2 wheeled) Transfers: Stand Pivot Transfers Sit to Stand: Mod assist;+2 physical assistance Stand pivot transfers: +2 physical assistance       General transfer comment: Cues required for hand placement, technique, and safety. Patient able to take small pivotal steps to recliner. Cues to control postioning and control back into recliner  Ambulation/Gait                 Stairs            Wheelchair Mobility    Modified Rankin (Stroke Patients Only)       Balance                                    Cognition  Arousal/Alertness: Awake/alert Behavior During Therapy: WFL for tasks assessed/performed Overall Cognitive Status: History of cognitive impairments - at baseline       Memory: Decreased short-term memory              Exercises      General Comments        Pertinent Vitals/Pain Pain Assessment: No/denies pain Faces Pain Scale: No hurt    Home Living                      Prior Function            PT Goals (current goals can now be found in the care plan section) Progress towards PT goals: Progressing toward goals    Frequency  Min 3X/week    PT Plan Current plan remains appropriate    Co-evaluation             End of Session Equipment Utilized During Treatment: Gait belt Activity Tolerance: Patient tolerated treatment well Patient left: in chair;with call bell/phone within reach     Time: 1006-1031 PT Time Calculation (min) (ACUTE ONLY): 25 min  Charges:  $Therapeutic Activity: 23-37 mins                    G Codes:      Jacqualyn Posey 12/02/2014,  1:20 PM  12/02/2014 Jacqualyn Posey PTA [phone] pager [phone] office

## 2014-12-02 NOTE — Discharge Summary (Signed)
Physician Discharge Summary      Patient ID: Laura [name] MRN: [id number] DOB/AGE: Jul 02, 1924 81 y.o.  Admit date: 11/27/2014 Discharge date: 12/02/2014  Admission Diagnoses:  <principal problem not specified>  Discharge Diagnoses:  Active Problems:   Periprosthetic fracture around internal prosthetic left hip joint   Hip arthritis   Past Medical History  Diagnosis Date  . Hypertension   . Arthritis   . Alzheimer's dementia   . Anxiety   . Depression   . COPD (chronic obstructive pulmonary disease)   . Presence of permanent cardiac pacemaker     LOOP RECORDER DEVICE    Surgeries: Procedure(s): REVISION OF LEFT HIP REPLACEMENT on 11/27/2014   Consultants (if any):    Discharged Condition: Improved  Hospital Course: Laura [name] is an 81 y.o. female who was admitted 11/27/2014 with a diagnosis of <principal problem not specified> and went to the operating room on 11/27/2014 and underwent the above named procedures.    She was given perioperative antibiotics:  Anti-infectives    Start     Dose/Rate Route Frequency Ordered Stop   11/28/14 0800  cefTRIAXone (ROCEPHIN) 1 g in dextrose 5 % 50 mL IVPB - Premix  Status:  Discontinued     1 g 100 mL/hr over 30 Minutes Intravenous Daily 11/28/14 0708 11/30/14 1201   11/27/14 1930  ceFAZolin (ANCEF) IVPB 2 g/50 mL premix     2 g 100 mL/hr over 30 Minutes Intravenous Every 6 hours 11/27/14 1806 11/28/14 0337   11/27/14 1100  ceFAZolin (ANCEF) IVPB 2 g/50 mL premix     2 g 100 mL/hr over 30 Minutes Intravenous To Surgery 11/26/14 1408 11/27/14 1327    .  She was given sequential compression devices, early ambulation, and lovenox for DVT prophylaxis.  She benefited maximally from the hospital stay and there were no complications.  She received 2 units of blood for acute postop blood loss anemia.  Recent vital signs:  Filed Vitals:   12/02/14 0538  BP: 132/52  Pulse: 101  Temp: 98 F (36.7 C)  Resp: 16     Recent laboratory studies:  Lab Results  Component Value Date   HGB 8.9* 12/02/2014   HGB 8.2* 12/01/2014   HGB 8.3* 11/30/2014   Lab Results  Component Value Date   WBC 8.5 12/02/2014   PLT 381 12/02/2014   Lab Results  Component Value Date   INR 1.02 11/27/2014   Lab Results  Component Value Date   NA 137 11/30/2014   K 4.2 11/30/2014   CL 105 11/30/2014   CO2 26 11/30/2014   BUN 20 11/30/2014   CREATININE 0.98 11/30/2014   GLUCOSE 93 11/30/2014    Discharge Medications:     Medication List    TAKE these medications        albuterol (2.5 MG/3ML) 0.083% nebulizer solution  Commonly known as:  PROVENTIL  Take 2.5 mg by nebulization every 6 (six) hours as needed for wheezing or shortness of breath.     albuterol 108 (90 BASE) MCG/ACT inhaler  Commonly known as:  PROVENTIL HFA;VENTOLIN HFA  Inhale 2 puffs into the lungs every 6 (six) hours as needed for wheezing or shortness of breath.     cholecalciferol 1000 UNITS tablet  Commonly known as:  VITAMIN D  Take 1,000 Units by mouth daily with breakfast.     CLEAR EYES MAXIMUM ITCHY EYE OP  Place 1-2 drops into both eyes 3 (three) times daily as needed (for  itching and redness). Wait 3 to 5 minutes between each drop     enoxaparin 40 MG/0.4ML injection  Commonly known as:  LOVENOX  Inject 0.4 mLs (40 mg total) into the skin daily.     enoxaparin 40 MG/0.4ML injection  Commonly known as:  LOVENOX  Inject 0.4 mLs (40 mg total) into the skin daily.     HCA TRIPLE ANTIBIOTIC OINTMENT EX  Apply 1 application topically as needed (for wound care).     HYDROcodone-acetaminophen 7.5-325 MG per tablet  Commonly known as:  NORCO  Take 1-2 tablets by mouth every 6 (six) hours as needed for moderate pain.     HYDROGEL Gel  Apply 1 application topically daily as needed (for rash). Apply to face     lactose free nutrition Liqd  Take 237 mLs by mouth 2 (two) times daily between meals.     loperamide 2 MG capsule   Commonly known as:  IMODIUM  Take 2 mg by mouth as needed for diarrhea or loose stools. NOT TO EXCEED 8 DOSES IN 24 HR     magnesium hydroxide 400 MG/5ML suspension  Commonly known as:  MILK OF MAGNESIA  Take 30 mLs by mouth daily as needed for mild constipation.     MAPAP 500 MG tablet  Generic drug:  acetaminophen  Take 500 mg by mouth every 6 (six) hours as needed for mild pain, fever or headache.     meloxicam 7.5 MG tablet  Commonly known as:  MOBIC  Take 7.5 mg by mouth daily with breakfast.     MI-ACID MAXIMUM STRENGTH 400-400-40 MG/5ML suspension  Generic drug:  alum & mag hydroxide-simeth  Take 30 mLs by mouth every 6 (six) hours as needed for indigestion.     multivitamin tablet  Take 1 tablet by mouth daily.     oxyCODONE 5 MG immediate release tablet  Commonly known as:  Oxy IR/ROXICODONE  Take 1-3 tablets (5-15 mg total) by mouth every 4 (four) hours as needed.     PARoxetine 40 MG tablet  Commonly known as:  PAXIL  Take 40 mg by mouth every morning.     polyethylene glycol packet  Commonly known as:  MIRALAX / GLYCOLAX  Take 17 g by mouth daily as needed for mild constipation.     Q-TUSSIN 100 MG/5ML syrup  Generic drug:  guaifenesin  Take 200 mg by mouth every 6 (six) hours as needed for cough.     senna-docusate 8.6-50 MG per tablet  Commonly known as:  Senokot-S  Take 1 tablet by mouth at bedtime.     temazepam 7.5 MG capsule  Commonly known as:  RESTORIL  Take 1 capsule (7.5 mg total) by mouth at bedtime.     traMADol 50 MG tablet  Commonly known as:  ULTRAM  Take one to two tablets by mouth every 6 hours as needed for breakthrough pain        Diagnostic Studies: Dg Chest 2 View  11/05/2014   CLINICAL DATA:  [place] at nursing home, shortness of breath, preoperative assessment  EXAM: CHEST  2 VIEW  COMPARISON:  10/07/2014  FINDINGS: Loop recorder projects over LEFT chest.  Enlargement of cardiac silhouette with pulmonary vascular congestion.   Atherosclerotic calcification aorta.  Emphysematous and bronchitic changes consistent with COPD.  Chronic accentuation of perihilar markings unchanged.  No gross infiltrate, pleural effusion or pneumothorax.  Calcified BILATERAL breast prostheses.  Marked osseous demineralization with multiple prior spinal augmentation procedures.  LEFT  shoulder prosthesis.  IMPRESSION: COPD changes.  No acute abnormalities.   Electronically Signed   By: Lavonia Dana M.D.   On: 11/05/2014 08:48   Dg Knee 2 Views Left  11/05/2014   CLINICAL DATA:  Left leg foreshortened.  Left knee pain.  Fall.  EXAM: LEFT KNEE - 1-2 VIEW  COMPARISON:  None.  FINDINGS: Degenerative changes in the left knee with joint space narrowing, spurring, most pronounced in the lateral compartment. No fracture, subluxation or dislocation. Diffuse osteopenia. Soft tissues are intact and unremarkable.  IMPRESSION: Tricompartment degenerative changes.  No acute bony abnormality.   Electronically Signed   By: Rolm Baptise M.D.   On: 11/05/2014 08:49   Ct Head Wo Contrast  11/08/2014   CLINICAL DATA:  Dementia  EXAM: CT HEAD WITHOUT CONTRAST  TECHNIQUE: Contiguous axial images were obtained from the base of the skull through the vertex without intravenous contrast.  COMPARISON:  4/19/ 16  FINDINGS: No skull fracture is noted. Paranasal sinuses and mastoid air cells are unremarkable. Stable atrophy. Stable periventricular and patchy subcortical chronic white matter disease. Atherosclerotic calcifications of carotid siphon again noted. No acute cortical infarction. No mass lesion is noted on this unenhanced scan. The gray and white-matter differentiation is preserved.  IMPRESSION: No acute intracranial abnormality. Stable atrophy and chronic white matter disease.   Electronically Signed   By: Lahoma Crocker M.D.   On: 11/08/2014 14:06   Ct Head Wo Contrast  11/05/2014   CLINICAL DATA:  Fall, found on floor covered in urine at 0615 hours today dementia, LEFT hip  pain, hypertension, Alzheimer's  EXAM: CT HEAD WITHOUT CONTRAST  CT CERVICAL SPINE WITHOUT CONTRAST  TECHNIQUE: Multidetector CT imaging of the head and cervical spine was performed following the standard protocol without intravenous contrast. Multiplanar CT image reconstructions of the cervical spine were also generated.  COMPARISON:  10/07/2014  FINDINGS: CT HEAD FINDINGS  Generalized atrophy.  Normal ventricular morphology.  No midline shift or mass effect.  Small vessel chronic ischemic changes of deep cerebral white matter.  No intracranial hemorrhage, mass lesion, or acute infarction.  Visualized paranasal sinuses and mastoid air cells clear.  Bones unremarkable.  CT CERVICAL SPINE FINDINGS  Beam hardening artifacts of dental origin and from LEFT shoulder prosthesis.  RIGHT thyroid mass 4.1 x 4.1 x 2.5 cm image 63.  Visualized skullbase intact.  Diffuse osseous demineralization.  Prevertebral soft tissues normal thickness.  Disc space narrowing with endplate spur formation at C5-C6.  Multilevel facet degenerative changes.  Vertebral body heights maintained without fracture or subluxation.  No bone destruction.  Lung apices clear.  IMPRESSION: Atrophy with small vessel chronic ischemic changes of deep cerebral white matter.  No acute intracranial abnormalities.  Degenerative disc and facet disease changes cervical spine.  No acute cervical spine abnormalities.  RIGHT thyroid mass 4.1 x 4.1 x 2.5 cm; dedicated thyroid ultrasound followup recommended.   Electronically Signed   By: Lavonia Dana M.D.   On: 11/05/2014 10:46   Ct Cervical Spine Wo Contrast  11/05/2014   CLINICAL DATA:  Fall, found on floor covered in urine at 0615 hours today dementia, LEFT hip pain, hypertension, Alzheimer's  EXAM: CT HEAD WITHOUT CONTRAST  CT CERVICAL SPINE WITHOUT CONTRAST  TECHNIQUE: Multidetector CT imaging of the head and cervical spine was performed following the standard protocol without intravenous contrast. Multiplanar CT  image reconstructions of the cervical spine were also generated.  COMPARISON:  10/07/2014  FINDINGS: CT HEAD FINDINGS  Generalized atrophy.  Normal ventricular morphology.  No midline shift or mass effect.  Small vessel chronic ischemic changes of deep cerebral white matter.  No intracranial hemorrhage, mass lesion, or acute infarction.  Visualized paranasal sinuses and mastoid air cells clear.  Bones unremarkable.  CT CERVICAL SPINE FINDINGS  Beam hardening artifacts of dental origin and from LEFT shoulder prosthesis.  RIGHT thyroid mass 4.1 x 4.1 x 2.5 cm image 63.  Visualized skullbase intact.  Diffuse osseous demineralization.  Prevertebral soft tissues normal thickness.  Disc space narrowing with endplate spur formation at C5-C6.  Multilevel facet degenerative changes.  Vertebral body heights maintained without fracture or subluxation.  No bone destruction.  Lung apices clear.  IMPRESSION: Atrophy with small vessel chronic ischemic changes of deep cerebral white matter.  No acute intracranial abnormalities.  Degenerative disc and facet disease changes cervical spine.  No acute cervical spine abnormalities.  RIGHT thyroid mass 4.1 x 4.1 x 2.5 cm; dedicated thyroid ultrasound followup recommended.   Electronically Signed   By: Lavonia Dana M.D.   On: 11/05/2014 10:46   US Soft Tissue Head/neck  11/07/2014   CLINICAL DATA:  81 year old female with a history of thyroid mass.  EXAM: THYROID ULTRASOUND  TECHNIQUE: Ultrasound examination of the thyroid gland and adjacent soft tissues was performed.  COMPARISON:  None.  FINDINGS: Right thyroid lobe  Measurements: 5.7 cm x 3.5 cm x 3.0 cm. Solid right thyroid nodule measures 4.0 cm x 2.6 cm x 2.6 cm  Left thyroid lobe  Measurements: 3.5 cm x 1.4 cm x 1.4 cm.  No nodules visualized.  Isthmus  Thickness: 4 mm.  No nodules visualized.  Lymphadenopathy  None visualized.  IMPRESSION: Single right-sided nodule which meets criteria for biopsy.  Ultrasound-guided fine needle  aspiration should be considered, as per the consensus statement: Management of Thyroid Nodules Detected at [place]: Society of Radiologists in [place]. Radiology 2005; [id number].  Signed,  Dulcy Fanny. Earleen Newport, DO  [facility]  [facility]   Electronically Signed   By: Corrie Mckusick D.O.   On: 11/07/2014 08:17   Dg Pelvis Portable  11/27/2014   CLINICAL DATA:  Postop from revision of left hip prosthesis.  EXAM: PORTABLE PELVIS 1-2 VIEWS  COMPARISON:  11/07/2014  FINDINGS: Left hip prosthesis is seen in appropriate position. A probable fracture through the lesser trochanter is seen adjacent to the femoral component of the prosthesis. No evidence of dislocation.  Calcified fibroid again seen within the right pelvis.  IMPRESSION: Left hip prosthesis in appropriate position, with probable fracture through the lesser trochanter adjacent to the femoral component of the prosthesis.   Electronically Signed   By: Earle Gell M.D.   On: 11/27/2014 22:01   Pelvis Portable  11/07/2014   CLINICAL DATA:  Status post left hip replacement  EXAM: PORTABLE PELVIS 1-2 VIEWS  COMPARISON:  None.  FINDINGS: A left hip prosthesis is noted. No acute abnormality is noted. The pelvic ring is intact. A calcified uterine fibroid is seen.  IMPRESSION: Status post left hip replacement, no acute abnormality noted.   Electronically Signed   By: Inez Catalina M.D.   On: 11/07/2014 16:26   Ct Femur Left Wo Contrast  11/21/2014   CLINICAL DATA:  History of 2 recent falls in the past 2 weeks. Status post left hip arthroplasty 10/19/2014  EXAM: CT OF THE LEFT FEMUR WITHOUT CONTRAST  TECHNIQUE: Multidetector CT imaging was performed according to the standard protocol. Multiplanar CT image reconstructions  were also generated.  COMPARISON:  Radiographs 11/06/2016  FINDINGS: There is a longitudinal split type fracture involving the upper left femur. The fracture extends from  the anterior aspect of the greater trochanter and continues down into the subtrochanteric region of the upper femoral shaft posteriorly.  The prosthesis is intact.  The bipolar portion is normally located.  IMPRESSION: Mildly displaced longitudinal split type fracture involving the greater trochanter and upper femoral shaft.  These results will be called to the ordering clinician or representative by the Radiologist Assistant, and communication documented in the PACS or zVision Dashboard.   Electronically Signed   By: Marijo Sanes M.D.   On: 11/21/2014 15:12   Dg Chest Port 1 View  11/29/2014   CLINICAL DATA:  81 year old female with left femur fracture following hip arthroplasty in April. Status post hardware revision. Leukocytosis. Initial encounter.  EXAM: PORTABLE CHEST - 1 VIEW  COMPARISON:  11/09/2014 and earlier.  FINDINGS: Portable AP semi upright view at 0730 hours. Sequelae of left shoulder arthroplasty, left chest cardiac event recorder, and multilevel compression fracture augmentation re - identified. Calcified breast implants re - identified. Stable lung volumes. Stable cardiomegaly and mediastinal contours. No pneumothorax, pulmonary edema, pleural effusion or acute pulmonary opacity.  IMPRESSION: No acute cardiopulmonary abnormality.   Electronically Signed   By: Genevie Ann M.D.   On: 11/29/2014 08:00   Dg Chest Port 1 View  11/09/2014   CLINICAL DATA:  81 year old female with a history of left hip surgery.  EXAM: PORTABLE CHEST - 1 VIEW  COMPARISON:  11/05/2014, 10/07/2014  FINDINGS: Cardiomediastinal silhouette unchanged in size and contour. Atherosclerotic calcifications of the aortic arch.  Unchanged position of Halter monitor on the left chest wall.  Diffusely coarsened interstitial markings. No confluent airspace disease. No pneumothorax or large pleural effusion.  Partially imaged surgical changes of the left shoulder.  Vertebroplasty of 3 levels again noted.  Surgical changes of prior  breast augmentation.  IMPRESSION: No evidence of lobar pneumonia with background of chronic lung changes.  Atherosclerosis.  Signed,  Dulcy Fanny. Earleen Newport, DO  [facility]  [facility]   Electronically Signed   By: Corrie Mckusick D.O.   On: 11/09/2014 10:25   Dg Hip Operative Unilat With Pelvis Left  11/27/2014   CLINICAL DATA:  Revision arthroplasty of the left hip. Intraoperative imaging.  EXAM: OPERATIVE LEFT HIP (WITH PELVIS IF PERFORMED) 1 VIEW  TECHNIQUE: Fluoroscopic spot image(s) were submitted for interpretation post-operatively.  FLUOROSCOPY TIME:  Radiation Exposure Index (as provided by the fluoroscopic device):  If the device does not provide the exposure index:  Fluoroscopy Time:  0 minutes and 13 seconds  Number of Acquired Images:  1  COMPARISON:  11/07/2014  FINDINGS: The arthroplasty appears well seated and aligned on this single view. There is no acute fracture or evidence of an operative complication.  IMPRESSION: Well aligned left hip hemiarthroplasty.   Electronically Signed   By: Lajean Manes M.D.   On: 11/27/2014 15:01   Dg Hip Operative Unilat With Pelvis Left  11/07/2014   CLINICAL DATA:  Subcapital femoral neck fracture  EXAM: OPERATIVE LEFT HIP WITH PELVIS  COMPARISON:  None.  FLUOROSCOPY TIME:  Radiation Exposure Index (as provided by the fluoroscopic device): Not available  If the device does not provide the exposure index:  Fluoroscopy Time:  32 seconds  Number of Acquired Images:  Or  FINDINGS: The left subcapital femoral neck fracture is again noted left hip replacement  was then performed. No acute abnormality is noted.  IMPRESSION: Status post left hip replacement   Electronically Signed   By: Inez Catalina M.D.   On: 11/07/2014 14:15   Dg Hip Unilat With Pelvis 2-3 Views Left  11/05/2014   CLINICAL DATA:  Status post fall at nursing home. Clinical shortening of the left leg with lateral rotation, severe pain  EXAM: LEFT HIP (WITH  PELVIS) 2-3 VIEWS  COMPARISON:  None.  FINDINGS: The patient has sustained complete fracture through the subcapital region of the left hip. There is mild superior migration of the femur with respect to the femoral head. The bony pelvis is intact. There is a calcified fibroid demonstrated.  IMPRESSION: The patient has sustained an acute subcapital fracture of the left hip with superior migration of the femur with respect to the femoral head.   Electronically Signed   By: David  Martinique   On: 11/05/2014 08:48    Disposition: 03-Skilled Nursing Facility      Discharge Instructions    Call MD / Call 911    Complete by:  As directed   If you experience chest pain or shortness of breath, CALL 911 and be transported to the hospital emergency room.  If you develope a fever above 101.5 F, pus (white drainage) or increased drainage or redness at the wound, or calf pain, call your surgeon's office.     Constipation Prevention    Complete by:  As directed   Drink plenty of fluids.  Prune juice may be helpful.  You may use a stool softener, such as Colace (over the counter) 100 mg twice a day.  Use MiraLax (over the counter) for constipation as needed.     Diet - low sodium heart healthy    Complete by:  As directed      Diet general    Complete by:  As directed      Driving restrictions    Complete by:  As directed   No driving while taking narcotic pain meds.     Increase activity slowly as tolerated    Complete by:  As directed      Weight bearing as tolerated    Complete by:  As directed            Follow-up Information    Follow up with Marianna Payment, MD In 2 weeks.   Specialty:  Orthopedic Surgery   Why:  For suture removal, For wound re-check   Contact information:   [address] [place] [address] [phone]        Signed: Marianna Payment 12/02/2014, 10:49 AM

## 2014-12-02 NOTE — Progress Notes (Signed)
Patient Demographics  Laura [name], is a 81 y.o. female, DOB - [birth year], KKD:[id number]  Admit date - 11/27/2014   Admitting Physician Naiping Ephriam Jenkins, MD  Outpatient Primary MD for the patient is Sande Brothers, MD  LOS - 5   No chief complaint on file.                                                            consult follow-up note  Admission HPI/Brief narrative: 81 year old with PMH significant for Thryroid mass, COPD emphysema, dementia, LBBB, recent hip fracture S/P repair 2 week ag was admitted on 5-11 for Revision of left hip arthroplasty of the femoral component. Medicine consulted to help with medical management.    Subjective:   [place] today has, No headache, No chest pain, No abdominal pain - No Nausea, No new weakness tingling or numbness, No Cough - SOB. Patient has good bowel movements yesterday, constipation resolved.  Assessment & Plan    Active Problems:   Periprosthetic fracture around internal prosthetic left hip joint   Hip arthritis  Left hip periprosthetic fracture - S/P Revision of left hip arthroplasty of the femoral component. Post operative management per ortho.  - Management as per orthopedics.  Alzheimer's dementia;  - Continue supportive care  UTI - Started on Rocephin 5/12, stopped 5/14, as finished 3 days. - Wound cultures growing gram-negative rods -Improving leukocytosis  COPD and emphysema - No active wheezing Continue with Albuterol PRN>   Thyroid mass incidentally noted on CT:  - Need repeat thyroid ultrasound in 6 months.   Anemia;  - Postop acute blood loss anemia, transfused total of 2 units during hospital stay on 5/12, 5/13, recheck CBC in a.m. to ensure hemoglobin is stable, - Hemoccult negative   DVT prophylaxis per Ortho.    Code Status: Full  Family Communication: None at bedside   Discharge orders appear to be  entered by orthopedic service.  Procedures  Revision of left hip arthroplasty of the femoral component One unit packed red blood cell transfusion 5/12 , and 5/13    Medications  Scheduled Meds: . sodium chloride   Intravenous Once  . sodium chloride   Intravenous Once  . cholecalciferol  1,000 Units Oral Q breakfast  . enoxaparin (LOVENOX) injection  40 mg Subcutaneous Q24H  . lactose free nutrition  237 mL Oral BID BM  . meloxicam  7.5 mg Oral Q breakfast  . pantoprazole  40 mg Oral Daily  . PARoxetine  40 mg Oral Daily  . senna-docusate  1 tablet Oral QHS  . temazepam  7.5 mg Oral QHS   Continuous Infusions: . lactated ringers 10 mL/hr at 11/27/14 1111   PRN Meds:.acetaminophen **OR** acetaminophen, albuterol, alum & mag hydroxide-simeth, baclofen, diphenhydrAMINE, guaifenesin, loperamide, magnesium hydroxide, menthol-cetylpyridinium **OR** phenol, metoCLOPramide **OR** metoCLOPramide (REGLAN) injection, morphine injection, ondansetron **OR** ondansetron (ZOFRAN) IV, oxyCODONE, polyethylene glycol, sorbitol  DVT Prophylaxis  Lovenox -  Lab Results  Component Value Date   PLT 381 12/02/2014    Antibiotics   Anti-infectives  Start     Dose/Rate Route Frequency Ordered Stop   11/28/14 0800  cefTRIAXone (ROCEPHIN) 1 g in dextrose 5 % 50 mL IVPB - Premix  Status:  Discontinued     1 g 100 mL/hr over 30 Minutes Intravenous Daily 11/28/14 0708 11/30/14 1201   11/27/14 1930  ceFAZolin (ANCEF) IVPB 2 g/50 mL premix     2 g 100 mL/hr over 30 Minutes Intravenous Every 6 hours 11/27/14 1806 11/28/14 0337   11/27/14 1100  ceFAZolin (ANCEF) IVPB 2 g/50 mL premix     2 g 100 mL/hr over 30 Minutes Intravenous To Surgery 11/26/14 1408 11/27/14 1327          Objective:   Filed Vitals:   11/30/14 2122 12/01/14 0453 12/01/14 2031 12/02/14 0538  BP: 131/57 120/66 110/48 132/52  Pulse: 110 108 101 101  Temp: 98.6 F (37 C) 98.7 F (37.1 C) 98.1 F (36.7 C) 98 F (36.7 C)   TempSrc: Oral Oral Oral   Resp: 18 18 18 16   Height:      Weight:      SpO2: 93% 91% 91% 93%    Wt Readings from Last 3 Encounters:  11/27/14 73.483 kg (162 lb)  11/13/14 73.573 kg (162 lb 3.2 oz)  10/25/14 74.9 kg (165 lb 2 oz)     Intake/Output Summary (Last 24 hours) at 12/02/14 1358 Last data filed at 12/02/14 0700  Gross per 24 hour  Intake    120 ml  Output      0 ml  Net    120 ml     Physical Exam  Awake Alert, pleasant, No new F.N deficits, Normal affect [place].AT,PERRAL Supple Neck,No JVD, No cervical lymphadenopathy appriciated.  Symmetrical Chest wall movement, Good air movement bilaterally, CTAB RRR,No Gallops,Rubs or new Murmurs, No Parasternal Heave +ve B.Sounds, Abd Soft, No tenderness, No organomegaly appriciated, No rebound - guarding or rigidity. No Cyanosis, Clubbing or edema, pulses felt bilaterally.   Data Review   Micro Results Recent Results (from the past 240 hour(s))  Urine culture     Status: None   Collection Time: 11/27/14 11:06 PM  Result Value Ref Range Status   Specimen Description URINE, CATHETERIZED  Final   Special Requests NONE  Final   Colony Count   Final    7,000 COLONIES/ML Performed at [facility]    Culture   Final    ESCHERICHIA COLI Performed at [facility]    Report Status 12/01/2014 FINAL  Final   Organism ID, Bacteria ESCHERICHIA COLI  Final      Susceptibility   Escherichia coli - MIC*    AMPICILLIN RESISTANT      CEFAZOLIN <=4 SENSITIVE Sensitive     CEFTRIAXONE <=1 SENSITIVE Sensitive     CIPROFLOXACIN <=0.25 SENSITIVE Sensitive     GENTAMICIN <=1 SENSITIVE Sensitive     LEVOFLOXACIN <=0.12 SENSITIVE Sensitive     NITROFURANTOIN <=16 SENSITIVE Sensitive     TOBRAMYCIN <=1 SENSITIVE Sensitive     TRIMETH/SULFA <=20 SENSITIVE Sensitive     PIP/TAZO <=4 SENSITIVE Sensitive     * ESCHERICHIA COLI    Radiology Reports Dg Chest 2 View  11/05/2014   CLINICAL DATA:  [place] at nursing home,  shortness of breath, preoperative assessment  EXAM: CHEST  2 VIEW  COMPARISON:  10/07/2014  FINDINGS: Loop recorder projects over LEFT chest.  Enlargement of cardiac silhouette with pulmonary vascular congestion.  Atherosclerotic calcification aorta.  Emphysematous and bronchitic changes  consistent with COPD.  Chronic accentuation of perihilar markings unchanged.  No gross infiltrate, pleural effusion or pneumothorax.  Calcified BILATERAL breast prostheses.  Marked osseous demineralization with multiple prior spinal augmentation procedures.  LEFT shoulder prosthesis.  IMPRESSION: COPD changes.  No acute abnormalities.   Electronically Signed   By: Lavonia Dana M.D.   On: 11/05/2014 08:48   Dg Knee 2 Views Left  11/05/2014   CLINICAL DATA:  Left leg foreshortened.  Left knee pain.  Fall.  EXAM: LEFT KNEE - 1-2 VIEW  COMPARISON:  None.  FINDINGS: Degenerative changes in the left knee with joint space narrowing, spurring, most pronounced in the lateral compartment. No fracture, subluxation or dislocation. Diffuse osteopenia. Soft tissues are intact and unremarkable.  IMPRESSION: Tricompartment degenerative changes.  No acute bony abnormality.   Electronically Signed   By: Rolm Baptise M.D.   On: 11/05/2014 08:49   Ct Head Wo Contrast  11/08/2014   CLINICAL DATA:  Dementia  EXAM: CT HEAD WITHOUT CONTRAST  TECHNIQUE: Contiguous axial images were obtained from the base of the skull through the vertex without intravenous contrast.  COMPARISON:  4/19/ 16  FINDINGS: No skull fracture is noted. Paranasal sinuses and mastoid air cells are unremarkable. Stable atrophy. Stable periventricular and patchy subcortical chronic white matter disease. Atherosclerotic calcifications of carotid siphon again noted. No acute cortical infarction. No mass lesion is noted on this unenhanced scan. The gray and white-matter differentiation is preserved.  IMPRESSION: No acute intracranial abnormality. Stable atrophy and chronic white matter  disease.   Electronically Signed   By: Lahoma Crocker M.D.   On: 11/08/2014 14:06   Ct Head Wo Contrast  11/05/2014   CLINICAL DATA:  Fall, found on floor covered in urine at 0615 hours today dementia, LEFT hip pain, hypertension, Alzheimer's  EXAM: CT HEAD WITHOUT CONTRAST  CT CERVICAL SPINE WITHOUT CONTRAST  TECHNIQUE: Multidetector CT imaging of the head and cervical spine was performed following the standard protocol without intravenous contrast. Multiplanar CT image reconstructions of the cervical spine were also generated.  COMPARISON:  10/07/2014  FINDINGS: CT HEAD FINDINGS  Generalized atrophy.  Normal ventricular morphology.  No midline shift or mass effect.  Small vessel chronic ischemic changes of deep cerebral white matter.  No intracranial hemorrhage, mass lesion, or acute infarction.  Visualized paranasal sinuses and mastoid air cells clear.  Bones unremarkable.  CT CERVICAL SPINE FINDINGS  Beam hardening artifacts of dental origin and from LEFT shoulder prosthesis.  RIGHT thyroid mass 4.1 x 4.1 x 2.5 cm image 63.  Visualized skullbase intact.  Diffuse osseous demineralization.  Prevertebral soft tissues normal thickness.  Disc space narrowing with endplate spur formation at C5-C6.  Multilevel facet degenerative changes.  Vertebral body heights maintained without fracture or subluxation.  No bone destruction.  Lung apices clear.  IMPRESSION: Atrophy with small vessel chronic ischemic changes of deep cerebral white matter.  No acute intracranial abnormalities.  Degenerative disc and facet disease changes cervical spine.  No acute cervical spine abnormalities.  RIGHT thyroid mass 4.1 x 4.1 x 2.5 cm; dedicated thyroid ultrasound followup recommended.   Electronically Signed   By: Lavonia Dana M.D.   On: 11/05/2014 10:46   Ct Cervical Spine Wo Contrast  11/05/2014   CLINICAL DATA:  Fall, found on floor covered in urine at 0615 hours today dementia, LEFT hip pain, hypertension, Alzheimer's  EXAM: CT HEAD  WITHOUT CONTRAST  CT CERVICAL SPINE WITHOUT CONTRAST  TECHNIQUE: Multidetector CT imaging of the head  and cervical spine was performed following the standard protocol without intravenous contrast. Multiplanar CT image reconstructions of the cervical spine were also generated.  COMPARISON:  10/07/2014  FINDINGS: CT HEAD FINDINGS  Generalized atrophy.  Normal ventricular morphology.  No midline shift or mass effect.  Small vessel chronic ischemic changes of deep cerebral white matter.  No intracranial hemorrhage, mass lesion, or acute infarction.  Visualized paranasal sinuses and mastoid air cells clear.  Bones unremarkable.  CT CERVICAL SPINE FINDINGS  Beam hardening artifacts of dental origin and from LEFT shoulder prosthesis.  RIGHT thyroid mass 4.1 x 4.1 x 2.5 cm image 63.  Visualized skullbase intact.  Diffuse osseous demineralization.  Prevertebral soft tissues normal thickness.  Disc space narrowing with endplate spur formation at C5-C6.  Multilevel facet degenerative changes.  Vertebral body heights maintained without fracture or subluxation.  No bone destruction.  Lung apices clear.  IMPRESSION: Atrophy with small vessel chronic ischemic changes of deep cerebral white matter.  No acute intracranial abnormalities.  Degenerative disc and facet disease changes cervical spine.  No acute cervical spine abnormalities.  RIGHT thyroid mass 4.1 x 4.1 x 2.5 cm; dedicated thyroid ultrasound followup recommended.   Electronically Signed   By: Lavonia Dana M.D.   On: 11/05/2014 10:46   US Soft Tissue Head/neck  11/07/2014   CLINICAL DATA:  81 year old female with a history of thyroid mass.  EXAM: THYROID ULTRASOUND  TECHNIQUE: Ultrasound examination of the thyroid gland and adjacent soft tissues was performed.  COMPARISON:  None.  FINDINGS: Right thyroid lobe  Measurements: 5.7 cm x 3.5 cm x 3.0 cm. Solid right thyroid nodule measures 4.0 cm x 2.6 cm x 2.6 cm  Left thyroid lobe  Measurements: 3.5 cm x 1.4 cm x 1.4 cm.   No nodules visualized.  Isthmus  Thickness: 4 mm.  No nodules visualized.  Lymphadenopathy  None visualized.  IMPRESSION: Single right-sided nodule which meets criteria for biopsy.  Ultrasound-guided fine needle aspiration should be considered, as per the consensus statement: Management of Thyroid Nodules Detected at [place]: Society of Radiologists in [place]. Radiology 2005; [id number].  Signed,  Dulcy Fanny. Earleen Newport, DO  [facility]  [facility]   Electronically Signed   By: Corrie Mckusick D.O.   On: 11/07/2014 08:17   Dg Pelvis Portable  11/27/2014   CLINICAL DATA:  Postop from revision of left hip prosthesis.  EXAM: PORTABLE PELVIS 1-2 VIEWS  COMPARISON:  11/07/2014  FINDINGS: Left hip prosthesis is seen in appropriate position. A probable fracture through the lesser trochanter is seen adjacent to the femoral component of the prosthesis. No evidence of dislocation.  Calcified fibroid again seen within the right pelvis.  IMPRESSION: Left hip prosthesis in appropriate position, with probable fracture through the lesser trochanter adjacent to the femoral component of the prosthesis.   Electronically Signed   By: Earle Gell M.D.   On: 11/27/2014 22:01   Pelvis Portable  11/07/2014   CLINICAL DATA:  Status post left hip replacement  EXAM: PORTABLE PELVIS 1-2 VIEWS  COMPARISON:  None.  FINDINGS: A left hip prosthesis is noted. No acute abnormality is noted. The pelvic ring is intact. A calcified uterine fibroid is seen.  IMPRESSION: Status post left hip replacement, no acute abnormality noted.   Electronically Signed   By: Inez Catalina M.D.   On: 11/07/2014 16:26   Ct Femur Left Wo Contrast  11/21/2014   CLINICAL DATA:  History of 2 recent falls  in the past 2 weeks. Status post left hip arthroplasty 10/19/2014  EXAM: CT OF THE LEFT FEMUR WITHOUT CONTRAST  TECHNIQUE: Multidetector CT imaging was performed according to the standard  protocol. Multiplanar CT image reconstructions were also generated.  COMPARISON:  Radiographs 11/06/2016  FINDINGS: There is a longitudinal split type fracture involving the upper left femur. The fracture extends from the anterior aspect of the greater trochanter and continues down into the subtrochanteric region of the upper femoral shaft posteriorly.  The prosthesis is intact.  The bipolar portion is normally located.  IMPRESSION: Mildly displaced longitudinal split type fracture involving the greater trochanter and upper femoral shaft.  These results will be called to the ordering clinician or representative by the Radiologist Assistant, and communication documented in the PACS or zVision Dashboard.   Electronically Signed   By: Marijo Sanes M.D.   On: 11/21/2014 15:12   Dg Chest Port 1 View  11/29/2014   CLINICAL DATA:  81 year old female with left femur fracture following hip arthroplasty in April. Status post hardware revision. Leukocytosis. Initial encounter.  EXAM: PORTABLE CHEST - 1 VIEW  COMPARISON:  11/09/2014 and earlier.  FINDINGS: Portable AP semi upright view at 0730 hours. Sequelae of left shoulder arthroplasty, left chest cardiac event recorder, and multilevel compression fracture augmentation re - identified. Calcified breast implants re - identified. Stable lung volumes. Stable cardiomegaly and mediastinal contours. No pneumothorax, pulmonary edema, pleural effusion or acute pulmonary opacity.  IMPRESSION: No acute cardiopulmonary abnormality.   Electronically Signed   By: Genevie Ann M.D.   On: 11/29/2014 08:00   Dg Chest Port 1 View  11/09/2014   CLINICAL DATA:  81 year old female with a history of left hip surgery.  EXAM: PORTABLE CHEST - 1 VIEW  COMPARISON:  11/05/2014, 10/07/2014  FINDINGS: Cardiomediastinal silhouette unchanged in size and contour. Atherosclerotic calcifications of the aortic arch.  Unchanged position of Hal[place] monitor on the left chest wall.  Diffusely coarsened  interstitial markings. No confluent airspace disease. No pneumothorax or large pleural effusion.  Partially imaged surgical changes of the left shoulder.  Vertebroplasty of 3 levels again noted.  Surgical changes of prior breast augmentation.  IMPRESSION: No evidence of lobar pneumonia with background of chronic lung changes.  Atherosclerosis.  Signed,  Dulcy Fanny. Earleen Newport, DO  [facility]  [facility]   Electronically Signed   By: Corrie Mckusick D.O.   On: 11/09/2014 10:25   Dg Hip Operative Unilat With Pelvis Left  11/27/2014   CLINICAL DATA:  Revision arthroplasty of the left hip. Intraoperative imaging.  EXAM: OPERATIVE LEFT HIP (WITH PELVIS IF PERFORMED) 1 VIEW  TECHNIQUE: Fluoroscopic spot image(s) were submitted for interpretation post-operatively.  FLUOROSCOPY TIME:  Radiation Exposure Index (as provided by the fluoroscopic device):  If the device does not provide the exposure index:  Fluoroscopy Time:  0 minutes and 13 seconds  Number of Acquired Images:  1  COMPARISON:  11/07/2014  FINDINGS: The arthroplasty appears well seated and aligned on this single view. There is no acute fracture or evidence of an operative complication.  IMPRESSION: Well aligned left hip hemiarthroplasty.   Electronically Signed   By: Lajean Manes M.D.   On: 11/27/2014 15:01   Dg Hip Operative Unilat With Pelvis Left  11/07/2014   CLINICAL DATA:  Subcapital femoral neck fracture  EXAM: OPERATIVE LEFT HIP WITH PELVIS  COMPARISON:  None.  FLUOROSCOPY TIME:  Radiation Exposure Index (as provided by the fluoroscopic device): Not available  If the device does not provide the exposure index:  Fluoroscopy Time:  32 seconds  Number of Acquired Images:  Or  FINDINGS: The left subcapital femoral neck fracture is again noted left hip replacement was then performed. No acute abnormality is noted.  IMPRESSION: Status post left hip replacement   Electronically Signed   By: Inez Catalina M.D.   On:  11/07/2014 14:15   Dg Hip Unilat With Pelvis 2-3 Views Left  11/05/2014   CLINICAL DATA:  Status post fall at nursing home. Clinical shortening of the left leg with lateral rotation, severe pain  EXAM: LEFT HIP (WITH PELVIS) 2-3 VIEWS  COMPARISON:  None.  FINDINGS: The patient has sustained complete fracture through the subcapital region of the left hip. There is mild superior migration of the femur with respect to the femoral head. The bony pelvis is intact. There is a calcified fibroid demonstrated.  IMPRESSION: The patient has sustained an acute subcapital fracture of the left hip with superior migration of the femur with respect to the femoral head.   Electronically Signed   By: David  Martinique   On: 11/05/2014 08:48     CBC  Recent Labs Lab 11/27/14 1108  11/28/14 0500 11/29/14 0315 11/29/14 1455 11/30/14 0732 12/01/14 0604 12/02/14 0606  WBC 13.0*  < > 12.9* 14.1*  --  13.0* 11.4* 8.5  HGB 10.5*  < > 7.4* 7.6* 8.9* 8.3* 8.2* 8.9*  HCT 35.1*  < > 24.4* 23.7* 28.0* 26.4* 26.5* 29.2*  PLT 468*  < > 429* 379  --  344 371 381  MCV 90.5  < > 90.7 89.4  --  89.2 91.1 91.8  MCH 27.1  < > 27.5 28.7  --  28.0 28.2 28.0  MCHC 29.9*  < > 30.3 32.1  --  31.4 30.9 30.5  RDW 14.6  < > 14.9 15.0  --  15.3 15.4 15.2  LYMPHSABS 1.4  --   --   --   --   --   --   --   MONOABS 0.8  --   --   --   --   --   --   --   EOSABS 0.2  --   --   --   --   --   --   --   BASOSABS 0.0  --   --   --   --   --   --   --   < > = values in this interval not displayed.  Chemistries   Recent Labs Lab 11/27/14 1108 11/27/14 1442 11/28/14 0500 11/30/14 0732  NA 141 139 140 137  K 4.7 4.9 5.1 4.2  CL 107  --  108 105  CO2 27  --  26 26  GLUCOSE 94 91 116* 93  BUN 23*  --  27* 20  CREATININE 1.13*  --  1.35* 0.98  CALCIUM 9.0  --  8.0* 8.1*  AST 24  --   --   --   ALT 17  --   --   --   ALKPHOS 153*  --   --   --   BILITOT 0.6  --   --   --     ------------------------------------------------------------------------------------------------------------------ estimated creatinine clearance is 39.8 mL/min (by C-G formula based on Cr of 0.98). ------------------------------------------------------------------------------------------------------------------ No results for input(s): HGBA1C in the last 72 hours. ------------------------------------------------------------------------------------------------------------------ No results for input(s): CHOL, HDL, LDLCALC, TRIG, CHOLHDL, LDLDIRECT in the last 72 hours. ------------------------------------------------------------------------------------------------------------------  No results for input(s): TSH, T4TOTAL, T3FREE, THYROIDAB in the last 72 hours.  Invalid input(s): FREET3 ------------------------------------------------------------------------------------------------------------------ No results for input(s): VITAMINB12, FOLATE, FERRITIN, TIBC, IRON, RETICCTPCT in the last 72 hours.  Coagulation profile  Recent Labs Lab 11/27/14 1108  INR 1.02    No results for input(s): DDIMER in the last 72 hours.  Cardiac Enzymes No results for input(s): CKMB, TROPONINI, MYOGLOBIN in the last 168 hours.  Invalid input(s): CK ------------------------------------------------------------------------------------------------------------------ Invalid input(s): POCBNP     Time Spent in minutes   no charge   [facility], Tajae Maiolo M.D on 12/02/2014 at 1:58 PM  Between 7am to 7pm - Pager - [phone]  After 7pm go to [email or web address] - password [facility]  [facility]   Office  [phone]

## 2014-12-02 NOTE — Progress Notes (Signed)
[place] called to give report to Genice Rouge. [phone]

## 2014-12-03 ENCOUNTER — Encounter: Payer: Self-pay | Admitting: Adult Health

## 2014-12-03 ENCOUNTER — Non-Acute Institutional Stay (SKILLED_NURSING_FACILITY): Payer: Medicare Other | Admitting: Adult Health

## 2014-12-03 DIAGNOSIS — K5901 Slow transit constipation: Secondary | ICD-10-CM

## 2014-12-03 DIAGNOSIS — Z96642 Presence of left artificial hip joint: Secondary | ICD-10-CM | POA: Diagnosis not present

## 2014-12-03 DIAGNOSIS — F0280 Dementia in other diseases classified elsewhere without behavioral disturbance: Secondary | ICD-10-CM | POA: Diagnosis not present

## 2014-12-03 DIAGNOSIS — J439 Emphysema, unspecified: Secondary | ICD-10-CM

## 2014-12-03 DIAGNOSIS — F329 Major depressive disorder, single episode, unspecified: Secondary | ICD-10-CM | POA: Diagnosis not present

## 2014-12-03 DIAGNOSIS — E079 Disorder of thyroid, unspecified: Secondary | ICD-10-CM

## 2014-12-03 DIAGNOSIS — G309 Alzheimer's disease, unspecified: Secondary | ICD-10-CM | POA: Diagnosis not present

## 2014-12-03 DIAGNOSIS — G4700 Insomnia, unspecified: Secondary | ICD-10-CM | POA: Diagnosis not present

## 2014-12-03 DIAGNOSIS — T84041A Periprosthetic fracture around internal prosthetic left hip joint, initial encounter: Secondary | ICD-10-CM

## 2014-12-03 DIAGNOSIS — D62 Acute posthemorrhagic anemia: Secondary | ICD-10-CM | POA: Diagnosis not present

## 2014-12-03 DIAGNOSIS — M9702XA Periprosthetic fracture around internal prosthetic left hip joint, initial encounter: Secondary | ICD-10-CM

## 2014-12-03 DIAGNOSIS — F32A Depression, unspecified: Secondary | ICD-10-CM

## 2014-12-03 NOTE — Progress Notes (Signed)
Patient ID: Laura [name], female   DOB: [birth year], 81 y.o.   MRN: [id number]   12/03/2014  Facility:  Nursing Home Location:  [place] Room Number: 1101-2 LEVEL OF CARE:  SNF (31)    Chief Complaint  Patient presents with  . Hospitalization Follow-up    Left hip periprosthetic fracture S/P revision of left hip arthroplasty, COPD, anemia, depression, constipation, insomnia, thyroid mass and Alzheimer's dementia    HISTORY OF PRESENT ILLNESS:  This is an 81 year old female who has been re-admitted to [facility] on 12/02/14 from [facility] She has PMH significant for thyroid mass, COPD emphysema, dementia, LBBB and a left hip fracture S/P repair on 11/07/14. She was noted to have left hip periprosthetic fracture  and had revision of left hip arthroplasty on 11/27/14.  She has been admitted for a short-term rehabilitation.  PAST MEDICAL HISTORY:  Past Medical History  Diagnosis Date  . Hypertension   . Arthritis   . Alzheimer's dementia   . Anxiety   . Depression   . COPD (chronic obstructive pulmonary disease)   . Presence of permanent cardiac pacemaker     LOOP RECORDER DEVICE    CURRENT MEDICATIONS: Reviewed per MAR/see medication list  Allergies  Allergen Reactions  . Codeine     Makes her ill  . Vicodin [Hydrocodone-Acetaminophen]     Listed on nursing home MAR     REVIEW OF SYSTEMS:  GENERAL: no change in appetite, no fatigue, no weight changes, no fever, chills or weakness RESPIRATORY: no cough, SOB, DOE, wheezing, hemoptysis CARDIAC: no chest pain,  or palpitations GI: no abdominal pain, diarrhea, constipation, heart burn, nausea or vomiting  PHYSICAL EXAMINATION  GENERAL: no acute distress, normal body habitus SKIN:  Left hip aquacel dressing intact, no erythema EYES: conjunctivae normal, sclerae normal, normal eye lids NECK: supple, trachea midline, no neck masses, no thyroid tenderness, no  thyromegaly LYMPHATICS: no LAN in the neck, no supraclavicular LAN RESPIRATORY: breathing is even & unlabored, BS CTAB CARDIAC: RRR, no murmur,no extra heart sounds, no edema GI: abdomen soft, normal BS, no masses, no tenderness, no hepatomegaly, no splenomegaly EXTREMITIES:  Able to move x 4 extremities ; LLE is tender due to surgey, LLEedema 1+ PSYCHIATRIC: the patient is alert & oriented to person, affect & behavior appropriate  LABS/RADIOLOGY: Labs reviewed: Basic Metabolic Panel:  Recent Labs  11/06/14 0909  11/27/14 1108 11/27/14 1442 11/28/14 0500 11/30/14 0732  NA 140  < > 141 139 140 137  K 3.9  < > 4.7 4.9 5.1 4.2  CL 104  < > 107  --  108 105  CO2 29  < > 27  --  26 26  GLUCOSE 101*  < > 94 91 116* 93  BUN 20  < > 23*  --  27* 20  CREATININE 1.05  < > 1.13*  --  1.35* 0.98  CALCIUM 8.9  < > 9.0  --  8.0* 8.1*  MG 1.9  --   --   --   --   --   < > = values in this interval not displayed. Liver Function Tests:  Recent Labs  10/23/14 0543 11/05/14 0805 11/27/14 1108  AST 21 22 24   ALT 18 16 17   ALKPHOS 53 73 153*  BILITOT 0.4 0.6 0.6  PROT 5.6* 6.7 5.6*  ALBUMIN 3.5 4.4 2.9*   CBC:  Recent Labs  11/05/14 0805 11/06/14 0909  11/27/14 1108  11/30/14 0732 12/01/14 0604 12/02/14 0606  WBC 16.5* 13.1*  < > 13.0*  < > 13.0* 11.4* 8.5  NEUTROABS 14.8* 10.7*  --  10.7*  --   --   --   --   HGB 14.8 14.4  < > 10.5*  < > 8.3* 8.2* 8.9*  HCT 47.2* 45.6  < > 35.1*  < > 26.4* 26.5* 29.2*  MCV 92.5 91.0  < > 90.5  < > 89.2 91.1 91.8  PLT 237 227  < > 468*  < > 344 371 381  < > = values in this interval not displayed.   Cardiac Enzymes:  Recent Labs  10/08/14 0255 10/08/14 1000 10/08/14 1448  TROPONINI <0.03 <0.03 <0.03   CBG:  Recent Labs  10/23/14 0720 10/24/14 0734 10/25/14 0731  GLUCAP 88 84 83    Dg Chest 2 View  11/05/2014   CLINICAL DATA:  [place] at nursing home, shortness of breath, preoperative assessment  EXAM: CHEST  2 VIEW   COMPARISON:  10/07/2014  FINDINGS: Loop recorder projects over LEFT chest.  Enlargement of cardiac silhouette with pulmonary vascular congestion.  Atherosclerotic calcification aorta.  Emphysematous and bronchitic changes consistent with COPD.  Chronic accentuation of perihilar markings unchanged.  No gross infiltrate, pleural effusion or pneumothorax.  Calcified BILATERAL breast prostheses.  Marked osseous demineralization with multiple prior spinal augmentation procedures.  LEFT shoulder prosthesis.  IMPRESSION: COPD changes.  No acute abnormalities.   Electronically Signed   By: Lavonia Dana M.D.   On: 11/05/2014 08:48   Dg Knee 2 Views Left  11/05/2014   CLINICAL DATA:  Left leg foreshortened.  Left knee pain.  Fall.  EXAM: LEFT KNEE - 1-2 VIEW  COMPARISON:  None.  FINDINGS: Degenerative changes in the left knee with joint space narrowing, spurring, most pronounced in the lateral compartment. No fracture, subluxation or dislocation. Diffuse osteopenia. Soft tissues are intact and unremarkable.  IMPRESSION: Tricompartment degenerative changes.  No acute bony abnormality.   Electronically Signed   By: Rolm Baptise M.D.   On: 11/05/2014 08:49   Ct Head Wo Contrast  11/08/2014   CLINICAL DATA:  Dementia  EXAM: CT HEAD WITHOUT CONTRAST  TECHNIQUE: Contiguous axial images were obtained from the base of the skull through the vertex without intravenous contrast.  COMPARISON:  4/19/ 16  FINDINGS: No skull fracture is noted. Paranasal sinuses and mastoid air cells are unremarkable. Stable atrophy. Stable periventricular and patchy subcortical chronic white matter disease. Atherosclerotic calcifications of carotid siphon again noted. No acute cortical infarction. No mass lesion is noted on this unenhanced scan. The gray and white-matter differentiation is preserved.  IMPRESSION: No acute intracranial abnormality. Stable atrophy and chronic white matter disease.   Electronically Signed   By: Lahoma Crocker M.D.   On:  11/08/2014 14:06   Ct Head Wo Contrast  11/05/2014   CLINICAL DATA:  Fall, found on floor covered in urine at 0615 hours today dementia, LEFT hip pain, hypertension, Alzheimer's  EXAM: CT HEAD WITHOUT CONTRAST  CT CERVICAL SPINE WITHOUT CONTRAST  TECHNIQUE: Multidetector CT imaging of the head and cervical spine was performed following the standard protocol without intravenous contrast. Multiplanar CT image reconstructions of the cervical spine were also generated.  COMPARISON:  10/07/2014  FINDINGS: CT HEAD FINDINGS  Generalized atrophy.  Normal ventricular morphology.  No midline shift or mass effect.  Small vessel chronic ischemic changes of deep cerebral white matter.  No intracranial hemorrhage, mass lesion, or acute infarction.  Visualized paranasal sinuses and mastoid air cells clear.  Bones unremarkable.  CT CERVICAL SPINE FINDINGS  Beam hardening artifacts of dental origin and from LEFT shoulder prosthesis.  RIGHT thyroid mass 4.1 x 4.1 x 2.5 cm image 63.  Visualized skullbase intact.  Diffuse osseous demineralization.  Prevertebral soft tissues normal thickness.  Disc space narrowing with endplate spur formation at C5-C6.  Multilevel facet degenerative changes.  Vertebral body heights maintained without fracture or subluxation.  No bone destruction.  Lung apices clear.  IMPRESSION: Atrophy with small vessel chronic ischemic changes of deep cerebral white matter.  No acute intracranial abnormalities.  Degenerative disc and facet disease changes cervical spine.  No acute cervical spine abnormalities.  RIGHT thyroid mass 4.1 x 4.1 x 2.5 cm; dedicated thyroid ultrasound followup recommended.   Electronically Signed   By: Lavonia Dana M.D.   On: 11/05/2014 10:46   Ct Cervical Spine Wo Contrast  11/05/2014   CLINICAL DATA:  Fall, found on floor covered in urine at 0615 hours today dementia, LEFT hip pain, hypertension, Alzheimer's  EXAM: CT HEAD WITHOUT CONTRAST  CT CERVICAL SPINE WITHOUT CONTRAST   TECHNIQUE: Multidetector CT imaging of the head and cervical spine was performed following the standard protocol without intravenous contrast. Multiplanar CT image reconstructions of the cervical spine were also generated.  COMPARISON:  10/07/2014  FINDINGS: CT HEAD FINDINGS  Generalized atrophy.  Normal ventricular morphology.  No midline shift or mass effect.  Small vessel chronic ischemic changes of deep cerebral white matter.  No intracranial hemorrhage, mass lesion, or acute infarction.  Visualized paranasal sinuses and mastoid air cells clear.  Bones unremarkable.  CT CERVICAL SPINE FINDINGS  Beam hardening artifacts of dental origin and from LEFT shoulder prosthesis.  RIGHT thyroid mass 4.1 x 4.1 x 2.5 cm image 63.  Visualized skullbase intact.  Diffuse osseous demineralization.  Prevertebral soft tissues normal thickness.  Disc space narrowing with endplate spur formation at C5-C6.  Multilevel facet degenerative changes.  Vertebral body heights maintained without fracture or subluxation.  No bone destruction.  Lung apices clear.  IMPRESSION: Atrophy with small vessel chronic ischemic changes of deep cerebral white matter.  No acute intracranial abnormalities.  Degenerative disc and facet disease changes cervical spine.  No acute cervical spine abnormalities.  RIGHT thyroid mass 4.1 x 4.1 x 2.5 cm; dedicated thyroid ultrasound followup recommended.   Electronically Signed   By: Lavonia Dana M.D.   On: 11/05/2014 10:46   US Soft Tissue Head/neck  11/07/2014   CLINICAL DATA:  81 year old female with a history of thyroid mass.  EXAM: THYROID ULTRASOUND  TECHNIQUE: Ultrasound examination of the thyroid gland and adjacent soft tissues was performed.  COMPARISON:  None.  FINDINGS: Right thyroid lobe  Measurements: 5.7 cm x 3.5 cm x 3.0 cm. Solid right thyroid nodule measures 4.0 cm x 2.6 cm x 2.6 cm  Left thyroid lobe  Measurements: 3.5 cm x 1.4 cm x 1.4 cm.  No nodules visualized.  Isthmus  Thickness: 4 mm.  No  nodules visualized.  Lymphadenopathy  None visualized.  IMPRESSION: Single right-sided nodule which meets criteria for biopsy.  Ultrasound-guided fine needle aspiration should be considered, as per the consensus statement: Management of Thyroid Nodules Detected at [place]: Society of Radiologists in [place]. Radiology 2005; [id number].  Signed,  Dulcy Fanny. Earleen Newport, DO  [facility]  [facility]   Electronically Signed   By: Corrie Mckusick D.O.   On: 11/07/2014 08:17  Dg Pelvis Portable  11/27/2014   CLINICAL DATA:  Postop from revision of left hip prosthesis.  EXAM: PORTABLE PELVIS 1-2 VIEWS  COMPARISON:  11/07/2014  FINDINGS: Left hip prosthesis is seen in appropriate position. A probable fracture through the lesser trochanter is seen adjacent to the femoral component of the prosthesis. No evidence of dislocation.  Calcified fibroid again seen within the right pelvis.  IMPRESSION: Left hip prosthesis in appropriate position, with probable fracture through the lesser trochanter adjacent to the femoral component of the prosthesis.   Electronically Signed   By: Earle Gell M.D.   On: 11/27/2014 22:01   Pelvis Portable  11/07/2014   CLINICAL DATA:  Status post left hip replacement  EXAM: PORTABLE PELVIS 1-2 VIEWS  COMPARISON:  None.  FINDINGS: A left hip prosthesis is noted. No acute abnormality is noted. The pelvic ring is intact. A calcified uterine fibroid is seen.  IMPRESSION: Status post left hip replacement, no acute abnormality noted.   Electronically Signed   By: Inez Catalina M.D.   On: 11/07/2014 16:26   Ct Femur Left Wo Contrast  11/21/2014   CLINICAL DATA:  History of 2 recent falls in the past 2 weeks. Status post left hip arthroplasty 10/19/2014  EXAM: CT OF THE LEFT FEMUR WITHOUT CONTRAST  TECHNIQUE: Multidetector CT imaging was performed according to the standard protocol. Multiplanar CT image reconstructions were also  generated.  COMPARISON:  Radiographs 11/06/2016  FINDINGS: There is a longitudinal split type fracture involving the upper left femur. The fracture extends from the anterior aspect of the greater trochanter and continues down into the subtrochanteric region of the upper femoral shaft posteriorly.  The prosthesis is intact.  The bipolar portion is normally located.  IMPRESSION: Mildly displaced longitudinal split type fracture involving the greater trochanter and upper femoral shaft.  These results will be called to the ordering clinician or representative by the Radiologist Assistant, and communication documented in the PACS or zVision Dashboard.   Electronically Signed   By: Marijo Sanes M.D.   On: 11/21/2014 15:12   Dg Chest Port 1 View  11/29/2014   CLINICAL DATA:  81 year old female with left femur fracture following hip arthroplasty in April. Status post hardware revision. Leukocytosis. Initial encounter.  EXAM: PORTABLE CHEST - 1 VIEW  COMPARISON:  11/09/2014 and earlier.  FINDINGS: Portable AP semi upright view at 0730 hours. Sequelae of left shoulder arthroplasty, left chest cardiac event recorder, and multilevel compression fracture augmentation re - identified. Calcified breast implants re - identified. Stable lung volumes. Stable cardiomegaly and mediastinal contours. No pneumothorax, pulmonary edema, pleural effusion or acute pulmonary opacity.  IMPRESSION: No acute cardiopulmonary abnormality.   Electronically Signed   By: Genevie Ann M.D.   On: 11/29/2014 08:00   Dg Chest Port 1 View  11/09/2014   CLINICAL DATA:  81 year old female with a history of left hip surgery.  EXAM: PORTABLE CHEST - 1 VIEW  COMPARISON:  11/05/2014, 10/07/2014  FINDINGS: Cardiomediastinal silhouette unchanged in size and contour. Atherosclerotic calcifications of the aortic arch.  Unchanged position of Halter monitor on the left chest wall.  Diffusely coarsened interstitial markings. No confluent airspace disease. No  pneumothorax or large pleural effusion.  Partially imaged surgical changes of the left shoulder.  Vertebroplasty of 3 levels again noted.  Surgical changes of prior breast augmentation.  IMPRESSION: No evidence of lobar pneumonia with background of chronic lung changes.  Atherosclerosis.  Signed,  Dulcy Fanny. Earleen Newport, DO  [facility]  [facility]   Electronically Signed   By: Corrie Mckusick D.O.   On: 11/09/2014 10:25   Dg Hip Operative Unilat With Pelvis Left  11/27/2014   CLINICAL DATA:  Revision arthroplasty of the left hip. Intraoperative imaging.  EXAM: OPERATIVE LEFT HIP (WITH PELVIS IF PERFORMED) 1 VIEW  TECHNIQUE: Fluoroscopic spot image(s) were submitted for interpretation post-operatively.  FLUOROSCOPY TIME:  Radiation Exposure Index (as provided by the fluoroscopic device):  If the device does not provide the exposure index:  Fluoroscopy Time:  0 minutes and 13 seconds  Number of Acquired Images:  1  COMPARISON:  11/07/2014  FINDINGS: The arthroplasty appears well seated and aligned on this single view. There is no acute fracture or evidence of an operative complication.  IMPRESSION: Well aligned left hip hemiarthroplasty.   Electronically Signed   By: Lajean Manes M.D.   On: 11/27/2014 15:01   Dg Hip Operative Unilat With Pelvis Left  11/07/2014   CLINICAL DATA:  Subcapital femoral neck fracture  EXAM: OPERATIVE LEFT HIP WITH PELVIS  COMPARISON:  None.  FLUOROSCOPY TIME:  Radiation Exposure Index (as provided by the fluoroscopic device): Not available  If the device does not provide the exposure index:  Fluoroscopy Time:  32 seconds  Number of Acquired Images:  Or  FINDINGS: The left subcapital femoral neck fracture is again noted left hip replacement was then performed. No acute abnormality is noted.  IMPRESSION: Status post left hip replacement   Electronically Signed   By: Inez Catalina M.D.   On: 11/07/2014 14:15   Dg Hip Unilat With Pelvis 2-3 Views  Left  11/05/2014   CLINICAL DATA:  Status post fall at nursing home. Clinical shortening of the left leg with lateral rotation, severe pain  EXAM: LEFT HIP (WITH PELVIS) 2-3 VIEWS  COMPARISON:  None.  FINDINGS: The patient has sustained complete fracture through the subcapital region of the left hip. There is mild superior migration of the femur with respect to the femoral head. The bony pelvis is intact. There is a calcified fibroid demonstrated.  IMPRESSION: The patient has sustained an acute subcapital fracture of the left hip with superior migration of the femur with respect to the femoral head.   Electronically Signed   By: David  Martinique   On: 11/05/2014 08:48    ASSESSMENT/PLAN:  Left hip periprosthetic fracture S/P revision of left hip arthroplasty - continue Lovenox 40 mg subcutaneous daily for DVT prophylaxis; Norco 7.5/325 mg 1-2 tabs by mouth every 6 hours when necessary, OxyIR 5 mg 1-2 tabs by mouth every 6 hours when necessary and tramadol 50 mg 1-2 tabs by mouth every 6 hours when necessary for pain Alzheimer's dementia - continue supportive care COPD - stable; continue albuterol when necessary Anemia, acute blood loss -  S/P transfusion of 2 units packed RBC on 5/12, 5/13; will monitor  Depression - mood is a stable; continue Paxil 40 mg by mouth every morning Constipation - continue senna S1 tab by mouth daily at bedtime and MiraLAX when necessary Insomnia - continue Restoril 7.5 mg by mouth daily at bedtime Thyroid mass - incidentally noted on CT, repeat ultrasound in 6 months   Goals of care:  Short-term rehabilitation   Labs/test ordered:   CBC and BMP on 12/09/14  Spent 50 minutes in patient care.    [facility], NP [employer] [phone]

## 2014-12-04 ENCOUNTER — Other Ambulatory Visit: Payer: Self-pay | Admitting: *Deleted

## 2014-12-04 MED ORDER — OXYCODONE HCL 5 MG PO TABS: 5.0000 mg | ORAL_TABLET | ORAL | Status: DC | PRN

## 2014-12-04 MED ORDER — HYDROCODONE-ACETAMINOPHEN 7.5-325 MG PO TABS: 1.0000 | ORAL_TABLET | Freq: Four times a day (QID) | ORAL | Status: DC | PRN

## 2014-12-05 ENCOUNTER — Non-Acute Institutional Stay (SKILLED_NURSING_FACILITY): Payer: Medicare Other | Admitting: Internal Medicine

## 2014-12-05 DIAGNOSIS — J439 Emphysema, unspecified: Secondary | ICD-10-CM

## 2014-12-05 DIAGNOSIS — F0280 Dementia in other diseases classified elsewhere without behavioral disturbance: Secondary | ICD-10-CM

## 2014-12-05 DIAGNOSIS — G309 Alzheimer's disease, unspecified: Secondary | ICD-10-CM | POA: Diagnosis not present

## 2014-12-05 DIAGNOSIS — T84048S Periprosthetic fracture around other internal prosthetic joint, sequela: Secondary | ICD-10-CM | POA: Diagnosis not present

## 2014-12-05 DIAGNOSIS — E46 Unspecified protein-calorie malnutrition: Secondary | ICD-10-CM

## 2014-12-05 DIAGNOSIS — K5901 Slow transit constipation: Secondary | ICD-10-CM | POA: Diagnosis not present

## 2014-12-05 DIAGNOSIS — M978XXS Periprosthetic fracture around other internal prosthetic joint, sequela: Secondary | ICD-10-CM

## 2014-12-05 DIAGNOSIS — G4700 Insomnia, unspecified: Secondary | ICD-10-CM

## 2014-12-05 DIAGNOSIS — D62 Acute posthemorrhagic anemia: Secondary | ICD-10-CM

## 2014-12-05 DIAGNOSIS — F329 Major depressive disorder, single episode, unspecified: Secondary | ICD-10-CM

## 2014-12-05 DIAGNOSIS — Z96649 Presence of unspecified artificial hip joint: Principal | ICD-10-CM

## 2014-12-05 DIAGNOSIS — F32A Depression, unspecified: Secondary | ICD-10-CM

## 2014-12-05 NOTE — Progress Notes (Signed)
Patient ID: Laura [name], female   DOB: Mar 27, 1924, 81 y.o.   MRN: [id number]     [facility]   PCP: Sande Brothers, MD  Code Status: full code  Allergies  Allergen Reactions  . Codeine     Makes her ill  . Vicodin [Hydrocodone-Acetaminophen]     Listed on nursing home [facility]    Chief Complaint  Patient presents with  . New Admit To SNF     HPI:  81 year old patient is here for short term rehabilitation post hospital admission from 11/27/14-12/02/14 with periprosthetic fracture around internal prosthetic left hip joint. She underwent revision of left hip replacement. She has medical history of dementia, depression, HTN, copd among others. She is seen in her room today. She denies any concerns. No new concern from staff  Review of Systems:  Constitutional: Negative for fever, chills, diaphoresis.  HENT: Negative for headache, congestion, nasal discharge Eyes: Negative for eye pain, blurred vision, double vision and discharge.  Respiratory: Negative for cough, shortness of breath and wheezing.   Cardiovascular: Negative for chest pain Gastrointestinal: Negative for heartburn, nausea, vomiting, abdominal pain.  Genitourinary: Negative for dysuria Musculoskeletal: Negative for back pain. Pain in left leg Skin: Negative for itching, rash.  Neurological: Negative for dizziness    Past Medical History  Diagnosis Date  . Hypertension   . Arthritis   . Alzheimer's dementia   . Anxiety   . Depression   . COPD (chronic obstructive pulmonary disease)   . Presence of permanent cardiac pacemaker     LOOP RECORDER DEVICE   Past Surgical History  Procedure Laterality Date  . Shoulder surgery      Left   . Loop recorder implant N/A 06/22/2012    Procedure: LOOP RECORDER IMPLANT;  Surgeon: Sanda Klein, MD;  Location: [place] CATH LAB;  Service: Cardiovascular;  Laterality: N/A;  . Total hip arthroplasty Left 11/07/2014    Procedure: Left hip  anterior hemiarthroplasty;  Surgeon: Leandrew Koyanagi, MD;  Location: [place];  Service: Orthopedics;  Laterality: Left;  . Anterior hip revision Left 11/27/2014    Procedure: REVISION OF LEFT HIP REPLACEMENT;  Surgeon: Leandrew Koyanagi, MD;  Location: [place];  Service: Orthopedics;  Laterality: Left;   Social History:   reports that she has quit smoking. She has never used smokeless tobacco. She reports that she does not drink alcohol or use illicit drugs.  No family history on file.  Medications: Patient's Medications  New Prescriptions   No medications on file  Previous Medications   ACETAMINOPHEN (MAPAP) 500 MG TABLET    Take 500 mg by mouth every 6 (six) hours as needed for mild pain, fever or headache.    ALBUTEROL (PROVENTIL HFA;VENTOLIN HFA) 108 (90 BASE) MCG/ACT INHALER    Inhale 2 puffs into the lungs every 6 (six) hours as needed for wheezing or shortness of breath.   ALBUTEROL (PROVENTIL) (2.5 MG/3ML) 0.083% NEBULIZER SOLUTION    Take 2.5 mg by nebulization every 6 (six) hours as needed for wheezing or shortness of breath.   ALUM & MAG HYDROXIDE-SIMETH (MI-ACID MAXIMUM STRENGTH) 400-400-40 MG/5ML SUSPENSION    Take 30 mLs by mouth every 6 (six) hours as needed for indigestion.    CARBOMER GEL BASE (HYDROGEL) GEL    Apply 1 application topically daily as needed (for rash). Apply to face   CHOLECALCIFEROL (VITAMIN D) 1000 UNITS TABLET    Take 1,000 Units by mouth daily with breakfast.  ENOXAPARIN (LOVENOX) 40 MG/0.4ML INJECTION    Inject 0.4 mLs (40 mg total) into the skin daily.   ENOXAPARIN (LOVENOX) 40 MG/0.4ML INJECTION    Inject 0.4 mLs (40 mg total) into the skin daily.   GUAIFENESIN (Q-TUSSIN) 100 MG/5ML SYRUP    Take 200 mg by mouth every 6 (six) hours as needed for cough.    HYDROCODONE-ACETAMINOPHEN (NORCO) 7.5-325 MG PER TABLET    Take 1-2 tablets by mouth every 6 (six) hours as needed for moderate pain.   LACTOSE FREE NUTRITION (BOOST PLUS) LIQD    Take 237 mLs by mouth 2 (two)  times daily between meals.   LOPERAMIDE (IMODIUM) 2 MG CAPSULE    Take 2 mg by mouth as needed for diarrhea or loose stools. NOT TO EXCEED 8 DOSES IN 24 HR   MAGNESIUM HYDROXIDE (MILK OF MAGNESIA) 400 MG/5ML SUSPENSION    Take 30 mLs by mouth daily as needed for mild constipation.    MELOXICAM (MOBIC) 7.5 MG TABLET    Take 7.5 mg by mouth daily with breakfast.   MULTIPLE VITAMINS-MINERALS (MULTIVITAMIN) TABLET    Take 1 tablet by mouth daily.   NAPHAZOLINE-GLYCERIN-ZINC SULF (CLEAR EYES MAXIMUM ITCHY EYE OP)    Place 1-2 drops into both eyes 3 (three) times daily as needed (for itching and redness). Wait 3 to 5 minutes between each drop   NEOMYCIN-BACITRACIN-POLYMYXIN (HCA TRIPLE ANTIBIOTIC OINTMENT EX)    Apply 1 application topically as needed (for wound care).    OXYCODONE (OXY IR/ROXICODONE) 5 MG IMMEDIATE RELEASE TABLET    Take 1-3 tablets (5-15 mg total) by mouth every 4 (four) hours as needed.   PAROXETINE (PAXIL) 40 MG TABLET    Take 40 mg by mouth every morning.   POLYETHYLENE GLYCOL (MIRALAX / GLYCOLAX) PACKET    Take 17 g by mouth daily as needed for mild constipation.    SENNA-DOCUSATE (SENOKOT-S) 8.6-50 MG PER TABLET    Take 1 tablet by mouth at bedtime.   TEMAZEPAM (RESTORIL) 7.5 MG CAPSULE    Take 1 capsule (7.5 mg total) by mouth at bedtime.   TRAMADOL (ULTRAM) 50 MG TABLET    Take one to two tablets by mouth every 6 hours as needed for breakthrough pain  Modified Medications   No medications on file  Discontinued Medications   No medications on file     Physical Exam: Filed Vitals:   12/05/14 1313  BP: 114/60  Pulse: 88  Temp: 97.3 F (36.3 C)  Resp: 18  SpO2: 95%    General- elderly female, well built, in no acute distress Head- normocephalic, atraumatic Throat- moist mucus membrane Neck- no cervical lymphadenopathy Cardiovascular- normal s1,s2, no murmurs, palpable dorsalis pedis, trace left leg edema Respiratory- bilateral clear to auscultation, no wheeze, no  rhonchi, no crackles, no use of accessory muscles Abdomen- bowel sounds present, soft, non tender Musculoskeletal- able to move all 4 extremities, left leg range of motion limited at hip  Neurological- no focal deficit Skin- warm and dry, aquacel dressing over left hip surgical incision Psychiatry- alert and oriented to person    Labs reviewed: Basic Metabolic Panel:  Recent Labs  11/06/14 0909  11/27/14 1108 11/27/14 1442 11/28/14 0500 11/30/14 0732  NA 140  < > 141 139 140 137  K 3.9  < > 4.7 4.9 5.1 4.2  CL 104  < > 107  --  108 105  CO2 29  < > 27  --  26 26  GLUCOSE 101*  < >  94 91 116* 93  BUN 20  < > 23*  --  27* 20  CREATININE 1.05  < > 1.13*  --  1.35* 0.98  CALCIUM 8.9  < > 9.0  --  8.0* 8.1*  MG 1.9  --   --   --   --   --   < > = values in this interval not displayed. Liver Function Tests:  Recent Labs  10/23/14 0543 11/05/14 0805 11/27/14 1108  AST 21 22 24   ALT 18 16 17   ALKPHOS 53 73 153*  BILITOT 0.4 0.6 0.6  PROT 5.6* 6.7 5.6*  ALBUMIN 3.5 4.4 2.9*   No results for input(s): LIPASE, AMYLASE in the last 8760 hours. No results for input(s): AMMONIA in the last 8760 hours. CBC:  Recent Labs  11/05/14 0805 11/06/14 0909  11/27/14 1108  11/30/14 0732 12/01/14 0604 12/02/14 0606  WBC 16.5* 13.1*  < > 13.0*  < > 13.0* 11.4* 8.5  NEUTROABS 14.8* 10.7*  --  10.7*  --   --   --   --   HGB 14.8 14.4  < > 10.5*  < > 8.3* 8.2* 8.9*  HCT 47.2* 45.6  < > 35.1*  < > 26.4* 26.5* 29.2*  MCV 92.5 91.0  < > 90.5  < > 89.2 91.1 91.8  PLT 237 227  < > 468*  < > 344 371 381  < > = values in this interval not displayed. Cardiac Enzymes:  Recent Labs  10/08/14 0255 10/08/14 1000 10/08/14 1448  TROPONINI <0.03 <0.03 <0.03   BNP: Invalid input(s): POCBNP CBG:  Recent Labs  10/23/14 0720 10/24/14 0734 10/25/14 0731  GLUCAP 88 84 83    Assessment/Plan  Left hip periprosthetic fracture  S/P revision of left hip arthroplasty. Has follow up with  orthopedics. Continue Lovenox 40 mg subcutaneous daily for DVT prophylaxis. Continue Norco 7.5/325 mg 1-2 tabs every 6 hours as needed for pain and tramadol 50 mg 1-2 tabs every 6 hours as needed. Will have patient work with PT/OT as tolerated to regain strength and restore function.  Fall precautions are in place.  Blood loss anemia Post op, monitor h&h. S/p 2 u prbc transfusion in hospital  Slow transit constipation Continue senna s 1 ta daily and prn miralax  Protein calorie malnutrition Monitor weight, continue medpass supplement and MVI  Alzheimer's dementia Pleasantly confused. Avoid sedatives/ narcotics if not needed. Fall precautions. Skin care and assistance with ADLs. Monitor po intake  COPD Stable, continue prn albuterol  Depression  continue Paxil 40 mg daily  Insomnia continue Restoril 7.5 mg daily   Goals of care: short term rehabilitation   Labs/tests ordered: cbc  Family/ staff Communication: reviewed care plan with patient and nursing supervisor    Blanchie Serve, MD  [facility] [phone] (Monday-Friday 8 am - 5 pm) [phone] (afterhours)

## 2014-12-10 DIAGNOSIS — M25552 Pain in left hip: Secondary | ICD-10-CM | POA: Diagnosis not present

## 2014-12-24 DIAGNOSIS — M25552 Pain in left hip: Secondary | ICD-10-CM | POA: Diagnosis not present

## 2015-01-09 ENCOUNTER — Other Ambulatory Visit: Payer: Self-pay | Admitting: *Deleted

## 2015-01-09 ENCOUNTER — Encounter: Payer: Self-pay | Admitting: Adult Health

## 2015-01-09 ENCOUNTER — Non-Acute Institutional Stay (SKILLED_NURSING_FACILITY): Payer: Medicare Other | Admitting: Adult Health

## 2015-01-09 DIAGNOSIS — T84048S Periprosthetic fracture around other internal prosthetic joint, sequela: Secondary | ICD-10-CM | POA: Diagnosis not present

## 2015-01-09 DIAGNOSIS — E079 Disorder of thyroid, unspecified: Secondary | ICD-10-CM

## 2015-01-09 DIAGNOSIS — E46 Unspecified protein-calorie malnutrition: Secondary | ICD-10-CM

## 2015-01-09 DIAGNOSIS — G309 Alzheimer's disease, unspecified: Secondary | ICD-10-CM | POA: Diagnosis not present

## 2015-01-09 DIAGNOSIS — G4700 Insomnia, unspecified: Secondary | ICD-10-CM | POA: Diagnosis not present

## 2015-01-09 DIAGNOSIS — F32A Depression, unspecified: Secondary | ICD-10-CM

## 2015-01-09 DIAGNOSIS — M25552 Pain in left hip: Secondary | ICD-10-CM | POA: Diagnosis not present

## 2015-01-09 DIAGNOSIS — D62 Acute posthemorrhagic anemia: Secondary | ICD-10-CM

## 2015-01-09 DIAGNOSIS — K5901 Slow transit constipation: Secondary | ICD-10-CM

## 2015-01-09 DIAGNOSIS — F0280 Dementia in other diseases classified elsewhere without behavioral disturbance: Secondary | ICD-10-CM

## 2015-01-09 DIAGNOSIS — Z96649 Presence of unspecified artificial hip joint: Principal | ICD-10-CM

## 2015-01-09 DIAGNOSIS — F329 Major depressive disorder, single episode, unspecified: Secondary | ICD-10-CM

## 2015-01-09 DIAGNOSIS — J439 Emphysema, unspecified: Secondary | ICD-10-CM

## 2015-01-09 DIAGNOSIS — M978XXS Periprosthetic fracture around other internal prosthetic joint, sequela: Secondary | ICD-10-CM

## 2015-01-09 DIAGNOSIS — E559 Vitamin D deficiency, unspecified: Secondary | ICD-10-CM

## 2015-01-09 MED ORDER — TEMAZEPAM 7.5 MG PO CAPS: ORAL_CAPSULE | ORAL | Status: DC

## 2015-01-09 NOTE — Progress Notes (Signed)
Patient ID: Laura [name], female   DOB: 1924-05-07, 81 y.o.   MRN: [id number]   01/09/2015  Facility:  Nursing Home Location:  [place] Room Number: 1101-2 LEVEL OF CARE:  SNF (31)    Chief Complaint  Patient presents with  . Medical Management of Chronic Issues    Periprosthetic hip fracture S/P left hip revision, anemia, protein calorie malnutrition, constipation, Alzheimer's dementia, COPD, depression, insomnia and vitamin D deficiency    HISTORY OF PRESENT ILLNESS:  This is an 81 year old female who is being seen on a routine visit. She has been re-admitted to [facility] on 12/02/14 from [facility]. She has PMH significant for thyroid mass, COPD emphysema, dementia, LBBB and a left hip fracture S/P repair on 11/07/14. She was noted to have left hip periprosthetic fracture  and had revision of left hip arthroplasty on 11/27/14.  She was recently started on Aricept 5 mg by mouth daily at bedtime for Alzheimer's dementia. She currently takes Paxil 40 mg daily for depression. Her mood is stable. Patient is presently and verbally responsive. She states that her pain is well controlled. COPD is stable. No SOB.  PAST MEDICAL HISTORY:  Past Medical History  Diagnosis Date  . Hypertension   . Arthritis   . Alzheimer's dementia   . Anxiety   . Depression   . COPD (chronic obstructive pulmonary disease)   . Presence of permanent cardiac pacemaker     LOOP RECORDER DEVICE    CURRENT MEDICATIONS: Reviewed per MAR/see medication list  Allergies  Allergen Reactions  . Codeine     Makes her ill  . Vicodin [Hydrocodone-Acetaminophen]     Listed on nursing home MAR     REVIEW OF SYSTEMS:  GENERAL: no change in appetite, no fatigue, no weight changes, no fever, chills or weakness RESPIRATORY: no cough, SOB, DOE, wheezing, hemoptysis CARDIAC: no chest pain,  or palpitations GI: no abdominal pain, diarrhea, constipation, heart burn, nausea  or vomiting  PHYSICAL EXAMINATION  GENERAL: no acute distress, normal body habitus SKIN:  Left hip surgical wound is healed EYES: conjunctivae normal, sclerae normal, normal eye lids NECK: supple, trachea midline, no neck masses, no thyroid tenderness, no thyromegaly LYMPHATICS: no LAN in the neck, no supraclavicular LAN RESPIRATORY: breathing is even & unlabored, BS CTAB CARDIAC: RRR, no murmur,no extra heart sounds, no edema GI: abdomen soft, normal BS, no masses, no tenderness, no hepatomegaly, no splenomegaly EXTREMITIES:  Able to move x 4 extremities ; LLE is tender due to surgey, LLEedema 1+ PSYCHIATRIC: the patient is alert & oriented to person, affect & behavior appropriate  LABS/RADIOLOGY: Labs reviewed: Basic Metabolic Panel:  Recent Labs  11/06/14 0909  11/27/14 1108 11/27/14 1442 11/28/14 0500 11/30/14 0732  NA 140  < > 141 139 140 137  K 3.9  < > 4.7 4.9 5.1 4.2  CL 104  < > 107  --  108 105  CO2 29  < > 27  --  26 26  GLUCOSE 101*  < > 94 91 116* 93  BUN 20  < > 23*  --  27* 20  CREATININE 1.05  < > 1.13*  --  1.35* 0.98  CALCIUM 8.9  < > 9.0  --  8.0* 8.1*  MG 1.9  --   --   --   --   --   < > = values in this interval not displayed. Liver Function Tests:  Recent Labs  10/23/14 0543 11/05/14 0805 11/27/14 1108  AST 21 22 24   ALT 18 16 17   ALKPHOS 53 73 153*  BILITOT 0.4 0.6 0.6  PROT 5.6* 6.7 5.6*  ALBUMIN 3.5 4.4 2.9*   CBC:  Recent Labs  11/05/14 0805 11/06/14 0909  11/27/14 1108  11/30/14 0732 12/01/14 0604 12/02/14 0606  WBC 16.5* 13.1*  < > 13.0*  < > 13.0* 11.4* 8.5  NEUTROABS 14.8* 10.7*  --  10.7*  --   --   --   --   HGB 14.8 14.4  < > 10.5*  < > 8.3* 8.2* 8.9*  HCT 47.2* 45.6  < > 35.1*  < > 26.4* 26.5* 29.2*  MCV 92.5 91.0  < > 90.5  < > 89.2 91.1 91.8  PLT 237 227  < > 468*  < > 344 371 381  < > = values in this interval not displayed.   Cardiac Enzymes:  Recent Labs  10/08/14 0255 10/08/14 1000 10/08/14 1448    TROPONINI <0.03 <0.03 <0.03   CBG:  Recent Labs  10/23/14 0720 10/24/14 0734 10/25/14 0731  GLUCAP 88 84 83    No results found.  ASSESSMENT/PLAN:  Left hip periprosthetic fracture S/P revision of left hip arthroplasty - continue tramadol 50 mg 1-2 tabs by mouth every 6 hours when necessary for pain; Norco and Oxycodone were discontinued; continue rehabilitation Alzheimer's dementia - continue supportive care; continue Aricept 5 mg PO Q D COPD - stable Anemia, acute blood loss -  hgb 8.7; will monitor Depression - mood is a stable; continue Paxil 40 mg by mouth every morning Constipation - continue senna S1 tab by mouth daily at bedtime and MiraLAX PRN was discontinued Insomnia - continue Restoril 7.5 mg by mouth daily at bedtime Thyroid mass - incidentally noted on CT, repeat ultrasound in 5 months Vitamin D deficiency - continue vitamin D 1000 units 1 by mouth daily Protein-calorie malnutrition, severe - albumin 2.9; continue supplementation   Goals of care:  Short-term rehabilitation   Labs/test ordered:   CBC and BMP     [place], NP [employer] [phone]

## 2015-01-09 NOTE — Telephone Encounter (Signed)
[facility] 

## 2015-01-30 ENCOUNTER — Other Ambulatory Visit: Payer: Self-pay

## 2015-01-30 DIAGNOSIS — G309 Alzheimer's disease, unspecified: Secondary | ICD-10-CM | POA: Diagnosis not present

## 2015-01-30 DIAGNOSIS — G4700 Insomnia, unspecified: Secondary | ICD-10-CM | POA: Diagnosis not present

## 2015-01-30 DIAGNOSIS — F329 Major depressive disorder, single episode, unspecified: Secondary | ICD-10-CM | POA: Diagnosis not present

## 2015-01-30 MED ORDER — TEMAZEPAM 7.5 MG PO CAPS: ORAL_CAPSULE | ORAL | Status: DC

## 2015-01-30 NOTE — Telephone Encounter (Signed)
Rx faxed to [facility] @ [phone], phone number [phone]  

## 2015-02-06 DIAGNOSIS — G4751 Confusional arousals: Secondary | ICD-10-CM | POA: Diagnosis not present

## 2015-02-10 DIAGNOSIS — G309 Alzheimer's disease, unspecified: Secondary | ICD-10-CM | POA: Diagnosis not present

## 2015-02-10 DIAGNOSIS — G4700 Insomnia, unspecified: Secondary | ICD-10-CM | POA: Diagnosis not present

## 2015-02-10 DIAGNOSIS — F329 Major depressive disorder, single episode, unspecified: Secondary | ICD-10-CM | POA: Diagnosis not present

## 2015-02-12 ENCOUNTER — Encounter: Payer: Self-pay | Admitting: *Deleted

## 2015-02-13 ENCOUNTER — Encounter: Payer: Self-pay | Admitting: Adult Health

## 2015-02-13 ENCOUNTER — Non-Acute Institutional Stay (SKILLED_NURSING_FACILITY): Payer: Medicare Other | Admitting: Adult Health

## 2015-02-13 DIAGNOSIS — T84048S Periprosthetic fracture around other internal prosthetic joint, sequela: Secondary | ICD-10-CM

## 2015-02-13 DIAGNOSIS — E079 Disorder of thyroid, unspecified: Secondary | ICD-10-CM | POA: Diagnosis not present

## 2015-02-13 DIAGNOSIS — K5901 Slow transit constipation: Secondary | ICD-10-CM | POA: Diagnosis not present

## 2015-02-13 DIAGNOSIS — G4700 Insomnia, unspecified: Secondary | ICD-10-CM | POA: Diagnosis not present

## 2015-02-13 DIAGNOSIS — D62 Acute posthemorrhagic anemia: Secondary | ICD-10-CM

## 2015-02-13 DIAGNOSIS — E559 Vitamin D deficiency, unspecified: Secondary | ICD-10-CM | POA: Diagnosis not present

## 2015-02-13 DIAGNOSIS — F0280 Dementia in other diseases classified elsewhere without behavioral disturbance: Secondary | ICD-10-CM | POA: Diagnosis not present

## 2015-02-13 DIAGNOSIS — F329 Major depressive disorder, single episode, unspecified: Secondary | ICD-10-CM | POA: Diagnosis not present

## 2015-02-13 DIAGNOSIS — J439 Emphysema, unspecified: Secondary | ICD-10-CM | POA: Diagnosis not present

## 2015-02-13 DIAGNOSIS — E46 Unspecified protein-calorie malnutrition: Secondary | ICD-10-CM

## 2015-02-13 DIAGNOSIS — F32A Depression, unspecified: Secondary | ICD-10-CM

## 2015-02-13 DIAGNOSIS — G309 Alzheimer's disease, unspecified: Secondary | ICD-10-CM

## 2015-02-13 DIAGNOSIS — M978XXS Periprosthetic fracture around other internal prosthetic joint, sequela: Secondary | ICD-10-CM

## 2015-02-13 DIAGNOSIS — Z96649 Presence of unspecified artificial hip joint: Principal | ICD-10-CM

## 2015-02-20 DIAGNOSIS — S72002D Fracture of unspecified part of neck of left femur, subsequent encounter for closed fracture with routine healing: Secondary | ICD-10-CM | POA: Diagnosis not present

## 2015-02-20 DIAGNOSIS — M25562 Pain in left knee: Secondary | ICD-10-CM | POA: Diagnosis not present

## 2015-03-31 DIAGNOSIS — R05 Cough: Secondary | ICD-10-CM | POA: Diagnosis not present

## 2015-04-13 NOTE — Progress Notes (Signed)
Patient ID: Laura [name], female   DOB: [birth year], 81 y.o.   MRN: [id number]   02/13/15  Facility:  Nursing Home Location:  [place] Room Number: 1101-2 LEVEL OF CARE:  SNF (31)    Chief Complaint  Patient presents with  . Medical Management of Chronic Issues    Periprosthetic hip fracture S/P left hip revision, anemia, protein calorie malnutrition, constipation, Alzheimer's dementia, COPD, depression, insomnia and vitamin D deficiency    HISTORY OF PRESENT ILLNESS:  This is an 81 year old female who is being seen on a routine visit. She has been re-admitted to [facility] on 12/02/14 from [facility]. She has PMH significant for thyroid mass, COPD emphysema, dementia, LBBB and a left hip fracture S/P repair on 11/07/14. She was noted to have left hip periprosthetic fracture  and had revision of left hip arthroplasty on 11/27/14.  Aricept was recently increased to 10 mg by mouth daily at bedtime for Alzheimer's dementia. She currently takes Paxil 40 mg daily for depression. Her mood is stable. Patient is presently and verbally responsive. COPD is stable. No SOB.  PAST MEDICAL HISTORY:  Past Medical History  Diagnosis Date  . Hypertension   . Arthritis   . Alzheimer's dementia   . Anxiety   . Depression   . COPD (chronic obstructive pulmonary disease)   . Presence of permanent cardiac pacemaker     LOOP RECORDER DEVICE  . Syncope   . LBBB (left bundle branch block)     CURRENT MEDICATIONS: Reviewed per MAR/see medication list    Medication List       This list is accurate as of: 02/13/15 11:59 PM.  Always use your most recent med list.               cholecalciferol 1000 UNITS tablet  Commonly known as:  VITAMIN D  Take 1,000 Units by mouth daily with breakfast.     donepezil 10 MG tablet  Commonly known as:  ARICEPT  Take 10 mg by mouth at bedtime.     HYDROGEL Gel  Apply 1 application topically daily as needed (for  rash). Apply to face     lactose free nutrition Liqd  Take 237 mLs by mouth 2 (two) times daily between meals.     loperamide 2 MG capsule  Commonly known as:  IMODIUM  Take 2 mg by mouth as needed for diarrhea or loose stools. NOT TO EXCEED 8 DOSES IN 24 HR     magnesium hydroxide 400 MG/5ML suspension  Commonly known as:  MILK OF MAGNESIA  Take 30 mLs by mouth daily as needed for mild constipation.     MAPAP 500 MG tablet  Generic drug:  acetaminophen  Take 500 mg by mouth every 6 (six) hours as needed for mild pain, fever or headache.     MI-ACID MAXIMUM STRENGTH 400-400-40 MG/5ML suspension  Generic drug:  alum & mag hydroxide-simeth  Take 30 mLs by mouth every 6 (six) hours as needed for indigestion.     multivitamin tablet  Take 1 tablet by mouth daily.     PARoxetine 40 MG tablet  Commonly known as:  PAXIL  Take 40 mg by mouth every morning.     PROCEL PO  Take 2 scoop by mouth 2 (two) times daily.     senna-docusate 8.6-50 MG per tablet  Commonly known as:  Senokot-S  Take 1 tablet by mouth at bedtime.  temazepam 7.5 MG capsule  Commonly known as:  RESTORIL  Take one capsule by mouth every night at bedtime as needed for insomnia     traMADol 50 MG tablet  Commonly known as:  ULTRAM  Take one to two tablets by mouth every 6 hours as needed for breakthrough pain         Allergies  Allergen Reactions  . Codeine     Makes her ill  . Vicodin [Hydrocodone-Acetaminophen]     Listed on nursing home MAR     REVIEW OF SYSTEMS:  GENERAL: no change in appetite, no fatigue, no weight changes, no fever, chills or weakness RESPIRATORY: no cough, SOB, DOE, wheezing, hemoptysis CARDIAC: no chest pain,  or palpitations GI: no abdominal pain, diarrhea, constipation, heart burn, nausea or vomiting  PHYSICAL EXAMINATION  GENERAL: no acute distress, normal body habitus SKIN:  Left hip surgical wound is healed EYES: conjunctivae normal, sclerae normal, normal  eye lids NECK: supple, trachea midline, no neck masses, no thyroid tenderness, no thyromegaly LYMPHATICS: no LAN in the neck, no supraclavicular LAN RESPIRATORY: breathing is even & unlabored, BS CTAB CARDIAC: RRR, no murmur,no extra heart sounds, no edema GI: abdomen soft, normal BS, no masses, no tenderness, no hepatomegaly, no splenomegaly EXTREMITIES:  Able to move x 4 extremities  PSYCHIATRIC: the patient is alert & oriented to person, affect & behavior appropriate  LABS/RADIOLOGY: Labs reviewed: 01/10/15  WBC 6.2 hemoglobin 11.7 hematocrit 36.8 MCV 87.4 with left count 276 sodium 144 potassium 4.5 glucose 78 BUN 20 creatinine 1.05 calcium 8.9 Basic Metabolic Panel:  Recent Labs  11/06/14 0909  11/27/14 1108 11/27/14 1442 11/28/14 0500 11/30/14 0732  NA 140  < > 141 139 140 137  K 3.9  < > 4.7 4.9 5.1 4.2  CL 104  < > 107  --  108 105  CO2 29  < > 27  --  26 26  GLUCOSE 101*  < > 94 91 116* 93  BUN 20  < > 23*  --  27* 20  CREATININE 1.05  < > 1.13*  --  1.35* 0.98  CALCIUM 8.9  < > 9.0  --  8.0* 8.1*  MG 1.9  --   --   --   --   --   < > = values in this interval not displayed. Liver Function Tests:  Recent Labs  10/23/14 0543 11/05/14 0805 11/27/14 1108  AST 21 22 24   ALT 18 16 17   ALKPHOS 53 73 153*  BILITOT 0.4 0.6 0.6  PROT 5.6* 6.7 5.6*  ALBUMIN 3.5 4.4 2.9*   CBC:  Recent Labs  11/05/14 0805 11/06/14 0909  11/27/14 1108  11/30/14 0732 12/01/14 0604 12/02/14 0606  WBC 16.5* 13.1*  < > 13.0*  < > 13.0* 11.4* 8.5  NEUTROABS 14.8* 10.7*  --  10.7*  --   --   --   --   HGB 14.8 14.4  < > 10.5*  < > 8.3* 8.2* 8.9*  HCT 47.2* 45.6  < > 35.1*  < > 26.4* 26.5* 29.2*  MCV 92.5 91.0  < > 90.5  < > 89.2 91.1 91.8  PLT 237 227  < > 468*  < > 344 371 381  < > = values in this interval not displayed.   Cardiac Enzymes:  Recent Labs  10/08/14 0255 10/08/14 1000 10/08/14 1448  TROPONINI <0.03 <0.03 <0.03   CBG:  Recent Labs  10/23/14 0720  10/24/14 0734 10/25/14  [place]     ASSESSMENT/PLAN:  Left hip periprosthetic fracture S/P revision of left hip arthroplasty - continue tramadol 50 mg 1-2 tabs by mouth every 6 hours when necessary for pain;   Alzheimer's dementia - continue supportive care; continue Aricept 5 mg PO Q D  COPD - stable   Anemia, acute blood loss -  hgb 11.7; improved  Depression - mood is a stable; continue Paxil 40 mg by mouth every morning  Constipation - continue senna S1 tab by mouth daily   Insomnia - continue Restoril 7.5 mg by mouth daily at bedtime  Thyroid mass - incidentally noted on CT, repeat ultrasound in 5 months  Vitamin D deficiency - continue vitamin D 1000 units 1 by mouth daily  Protein-calorie malnutrition, severe - albumin 2.9; continue supplementation     Goals of care:  Long-term care    [facility], [facility] [phone]

## 2015-04-14 DIAGNOSIS — Z9181 History of falling: Secondary | ICD-10-CM | POA: Diagnosis not present

## 2015-04-14 DIAGNOSIS — G309 Alzheimer's disease, unspecified: Secondary | ICD-10-CM | POA: Diagnosis not present

## 2015-04-14 DIAGNOSIS — R488 Other symbolic dysfunctions: Secondary | ICD-10-CM | POA: Diagnosis not present

## 2015-04-14 DIAGNOSIS — R41841 Cognitive communication deficit: Secondary | ICD-10-CM | POA: Diagnosis not present

## 2015-04-14 DIAGNOSIS — J449 Chronic obstructive pulmonary disease, unspecified: Secondary | ICD-10-CM | POA: Diagnosis not present

## 2015-04-15 DIAGNOSIS — G309 Alzheimer's disease, unspecified: Secondary | ICD-10-CM | POA: Diagnosis not present

## 2015-04-15 DIAGNOSIS — Z9181 History of falling: Secondary | ICD-10-CM | POA: Diagnosis not present

## 2015-04-15 DIAGNOSIS — J449 Chronic obstructive pulmonary disease, unspecified: Secondary | ICD-10-CM | POA: Diagnosis not present

## 2015-04-15 DIAGNOSIS — R488 Other symbolic dysfunctions: Secondary | ICD-10-CM | POA: Diagnosis not present

## 2015-04-15 DIAGNOSIS — R41841 Cognitive communication deficit: Secondary | ICD-10-CM | POA: Diagnosis not present

## 2015-04-16 ENCOUNTER — Encounter: Payer: Self-pay | Admitting: *Deleted

## 2015-04-16 DIAGNOSIS — G309 Alzheimer's disease, unspecified: Secondary | ICD-10-CM | POA: Diagnosis not present

## 2015-04-16 DIAGNOSIS — F329 Major depressive disorder, single episode, unspecified: Secondary | ICD-10-CM | POA: Diagnosis not present

## 2015-04-16 DIAGNOSIS — J449 Chronic obstructive pulmonary disease, unspecified: Secondary | ICD-10-CM | POA: Diagnosis not present

## 2015-04-16 DIAGNOSIS — G4700 Insomnia, unspecified: Secondary | ICD-10-CM | POA: Diagnosis not present

## 2015-04-16 DIAGNOSIS — R41841 Cognitive communication deficit: Secondary | ICD-10-CM | POA: Diagnosis not present

## 2015-04-16 DIAGNOSIS — R488 Other symbolic dysfunctions: Secondary | ICD-10-CM | POA: Diagnosis not present

## 2015-04-16 DIAGNOSIS — Z9181 History of falling: Secondary | ICD-10-CM | POA: Diagnosis not present

## 2015-04-17 DIAGNOSIS — G309 Alzheimer's disease, unspecified: Secondary | ICD-10-CM | POA: Diagnosis not present

## 2015-04-17 DIAGNOSIS — R41841 Cognitive communication deficit: Secondary | ICD-10-CM | POA: Diagnosis not present

## 2015-04-17 DIAGNOSIS — J449 Chronic obstructive pulmonary disease, unspecified: Secondary | ICD-10-CM | POA: Diagnosis not present

## 2015-04-17 DIAGNOSIS — N390 Urinary tract infection, site not specified: Secondary | ICD-10-CM | POA: Diagnosis not present

## 2015-04-17 DIAGNOSIS — Z9181 History of falling: Secondary | ICD-10-CM | POA: Diagnosis not present

## 2015-04-17 DIAGNOSIS — R488 Other symbolic dysfunctions: Secondary | ICD-10-CM | POA: Diagnosis not present

## 2015-04-18 DIAGNOSIS — J449 Chronic obstructive pulmonary disease, unspecified: Secondary | ICD-10-CM | POA: Diagnosis not present

## 2015-04-18 DIAGNOSIS — R488 Other symbolic dysfunctions: Secondary | ICD-10-CM | POA: Diagnosis not present

## 2015-04-18 DIAGNOSIS — Z9181 History of falling: Secondary | ICD-10-CM | POA: Diagnosis not present

## 2015-04-18 DIAGNOSIS — R41841 Cognitive communication deficit: Secondary | ICD-10-CM | POA: Diagnosis not present

## 2015-04-18 DIAGNOSIS — G309 Alzheimer's disease, unspecified: Secondary | ICD-10-CM | POA: Diagnosis not present

## 2015-04-21 DIAGNOSIS — Z9181 History of falling: Secondary | ICD-10-CM | POA: Diagnosis not present

## 2015-04-21 DIAGNOSIS — G309 Alzheimer's disease, unspecified: Secondary | ICD-10-CM | POA: Diagnosis not present

## 2015-04-21 DIAGNOSIS — J449 Chronic obstructive pulmonary disease, unspecified: Secondary | ICD-10-CM | POA: Diagnosis not present

## 2015-04-21 DIAGNOSIS — R488 Other symbolic dysfunctions: Secondary | ICD-10-CM | POA: Diagnosis not present

## 2015-04-21 DIAGNOSIS — R41841 Cognitive communication deficit: Secondary | ICD-10-CM | POA: Diagnosis not present

## 2015-04-22 DIAGNOSIS — R41841 Cognitive communication deficit: Secondary | ICD-10-CM | POA: Diagnosis not present

## 2015-04-22 DIAGNOSIS — G309 Alzheimer's disease, unspecified: Secondary | ICD-10-CM | POA: Diagnosis not present

## 2015-04-22 DIAGNOSIS — Z79899 Other long term (current) drug therapy: Secondary | ICD-10-CM | POA: Diagnosis not present

## 2015-04-22 DIAGNOSIS — R488 Other symbolic dysfunctions: Secondary | ICD-10-CM | POA: Diagnosis not present

## 2015-04-22 DIAGNOSIS — Z9181 History of falling: Secondary | ICD-10-CM | POA: Diagnosis not present

## 2015-04-22 DIAGNOSIS — J449 Chronic obstructive pulmonary disease, unspecified: Secondary | ICD-10-CM | POA: Diagnosis not present

## 2015-04-23 DIAGNOSIS — R488 Other symbolic dysfunctions: Secondary | ICD-10-CM | POA: Diagnosis not present

## 2015-04-23 DIAGNOSIS — Z9181 History of falling: Secondary | ICD-10-CM | POA: Diagnosis not present

## 2015-04-23 DIAGNOSIS — G309 Alzheimer's disease, unspecified: Secondary | ICD-10-CM | POA: Diagnosis not present

## 2015-04-23 DIAGNOSIS — J449 Chronic obstructive pulmonary disease, unspecified: Secondary | ICD-10-CM | POA: Diagnosis not present

## 2015-04-23 DIAGNOSIS — R41841 Cognitive communication deficit: Secondary | ICD-10-CM | POA: Diagnosis not present

## 2015-04-24 DIAGNOSIS — Z9181 History of falling: Secondary | ICD-10-CM | POA: Diagnosis not present

## 2015-04-24 DIAGNOSIS — J449 Chronic obstructive pulmonary disease, unspecified: Secondary | ICD-10-CM | POA: Diagnosis not present

## 2015-04-24 DIAGNOSIS — G309 Alzheimer's disease, unspecified: Secondary | ICD-10-CM | POA: Diagnosis not present

## 2015-04-24 DIAGNOSIS — R488 Other symbolic dysfunctions: Secondary | ICD-10-CM | POA: Diagnosis not present

## 2015-04-24 DIAGNOSIS — R41841 Cognitive communication deficit: Secondary | ICD-10-CM | POA: Diagnosis not present

## 2015-04-25 DIAGNOSIS — Z9181 History of falling: Secondary | ICD-10-CM | POA: Diagnosis not present

## 2015-04-25 DIAGNOSIS — R41841 Cognitive communication deficit: Secondary | ICD-10-CM | POA: Diagnosis not present

## 2015-04-25 DIAGNOSIS — G309 Alzheimer's disease, unspecified: Secondary | ICD-10-CM | POA: Diagnosis not present

## 2015-04-25 DIAGNOSIS — R488 Other symbolic dysfunctions: Secondary | ICD-10-CM | POA: Diagnosis not present

## 2015-04-25 DIAGNOSIS — J449 Chronic obstructive pulmonary disease, unspecified: Secondary | ICD-10-CM | POA: Diagnosis not present

## 2015-04-28 ENCOUNTER — Encounter: Payer: Self-pay | Admitting: Adult Health

## 2015-04-28 ENCOUNTER — Non-Acute Institutional Stay (SKILLED_NURSING_FACILITY): Payer: Medicare Other | Admitting: Adult Health

## 2015-04-28 DIAGNOSIS — F329 Major depressive disorder, single episode, unspecified: Secondary | ICD-10-CM

## 2015-04-28 DIAGNOSIS — J439 Emphysema, unspecified: Secondary | ICD-10-CM

## 2015-04-28 DIAGNOSIS — G4700 Insomnia, unspecified: Secondary | ICD-10-CM

## 2015-04-28 DIAGNOSIS — D62 Acute posthemorrhagic anemia: Secondary | ICD-10-CM

## 2015-04-28 DIAGNOSIS — E559 Vitamin D deficiency, unspecified: Secondary | ICD-10-CM | POA: Diagnosis not present

## 2015-04-28 DIAGNOSIS — F32A Depression, unspecified: Secondary | ICD-10-CM

## 2015-04-28 DIAGNOSIS — F0280 Dementia in other diseases classified elsewhere without behavioral disturbance: Secondary | ICD-10-CM

## 2015-04-28 DIAGNOSIS — E079 Disorder of thyroid, unspecified: Secondary | ICD-10-CM

## 2015-04-28 DIAGNOSIS — E46 Unspecified protein-calorie malnutrition: Secondary | ICD-10-CM | POA: Diagnosis not present

## 2015-04-28 DIAGNOSIS — R41841 Cognitive communication deficit: Secondary | ICD-10-CM | POA: Diagnosis not present

## 2015-04-28 DIAGNOSIS — M9702XS Periprosthetic fracture around internal prosthetic left hip joint, sequela: Secondary | ICD-10-CM

## 2015-04-28 DIAGNOSIS — K5901 Slow transit constipation: Secondary | ICD-10-CM

## 2015-04-28 DIAGNOSIS — R488 Other symbolic dysfunctions: Secondary | ICD-10-CM | POA: Diagnosis not present

## 2015-04-28 DIAGNOSIS — G309 Alzheimer's disease, unspecified: Secondary | ICD-10-CM

## 2015-04-28 DIAGNOSIS — Z9181 History of falling: Secondary | ICD-10-CM | POA: Diagnosis not present

## 2015-04-28 DIAGNOSIS — J449 Chronic obstructive pulmonary disease, unspecified: Secondary | ICD-10-CM | POA: Diagnosis not present

## 2015-04-28 NOTE — Progress Notes (Signed)
Patient ID: Laura [name], female   DOB: 20-Sep-1923, 81 y.o.   MRN: [id number]   04/28/15  Facility:  Nursing Home Location:  [place] Room Number: 1101-2 LEVEL OF CARE:  SNF (31)    Chief Complaint  Patient presents with  . Discharge Note    Periprosthetic hip fracture S/P left hip revision, anemia, protein calorie malnutrition, constipation, Alzheimer's dementia, COPD, depression, insomnia and vitamin D deficiency    HISTORY OF PRESENT ILLNESS:  This is an 81 year old female who is for discharge home with private caregiver, medications and Home health PT and ST. She has been re-admitted to [facility] on 12/02/14 from [facility]. She has PMH significant for thyroid mass, COPD emphysema, dementia, LBBB and a left hip fracture S/P repair on 11/07/14. She was noted to have left hip periprosthetic fracture  and had revision of left hip arthroplasty on 11/27/14.  She had short-term rehabilitation @ [facility] and was long-term care but will discharge home per patient request and appropriate placement.  PAST MEDICAL HISTORY:  Past Medical History  Diagnosis Date  . Hypertension   . Arthritis   . Alzheimer's dementia   . Anxiety   . Depression   . COPD (chronic obstructive pulmonary disease) ([place])   . Presence of permanent cardiac pacemaker     LOOP RECORDER DEVICE  . Syncope   . LBBB (left bundle branch block)     CURRENT MEDICATIONS: Reviewed per MAR/see medication list    Medication List       This list is accurate as of: 04/28/15  6:12 PM.  Always use your most recent med list.               cholecalciferol 1000 UNITS tablet  Commonly known as:  VITAMIN D  Take 1,000 Units by mouth daily with breakfast.     donepezil 10 MG tablet  Commonly known as:  ARICEPT  Take 10 mg by mouth at bedtime.     HYDROGEL Gel  Apply 1 application topically daily as needed (for rash). Apply to face     lactose free nutrition Liqd    Take 237 mLs by mouth 2 (two) times daily between meals.     loperamide 2 MG capsule  Commonly known as:  IMODIUM  Take 2 mg by mouth as needed for diarrhea or loose stools. NOT TO EXCEED 8 DOSES IN 24 HR     magnesium hydroxide 400 MG/5ML suspension  Commonly known as:  MILK OF MAGNESIA  Take 30 mLs by mouth daily as needed for mild constipation.     MAPAP 500 MG tablet  Generic drug:  acetaminophen  Take 500 mg by mouth every 6 (six) hours as needed for mild pain, fever or headache.     MI-ACID MAXIMUM STRENGTH 400-400-40 MG/5ML suspension  Generic drug:  alum & mag hydroxide-simeth  Take 30 mLs by mouth every 6 (six) hours as needed for indigestion.     multivitamin tablet  Take 1 tablet by mouth daily.     PARoxetine 40 MG tablet  Commonly known as:  PAXIL  Take 40 mg by mouth every morning.     PROCEL PO  Take 2 scoop by mouth 2 (two) times daily.     senna-docusate 8.6-50 MG tablet  Commonly known as:  Senokot-S  Take 1 tablet by mouth at bedtime.     traMADol 50 MG tablet  Commonly known as:  ULTRAM  Take  one to two tablets by mouth every 6 hours as needed for breakthrough pain         Allergies  Allergen Reactions  . Codeine     Makes her ill  . Vicodin [Hydrocodone-Acetaminophen]     Listed on nursing home MAR     REVIEW OF SYSTEMS:  GENERAL: no change in appetite, no fatigue, no weight changes, no fever, chills or weakness RESPIRATORY: no cough, SOB, DOE, wheezing, hemoptysis CARDIAC: no chest pain,  or palpitations GI: no abdominal pain, diarrhea, constipation, heart burn, nausea or vomiting  PHYSICAL EXAMINATION  GENERAL: no acute distress, normal body habitus SKIN:  Left hip surgical wound is healed EYES: conjunctivae normal, sclerae normal, normal eye lids NECK: supple, trachea midline, no neck masses, no thyroid tenderness, no thyromegaly LYMPHATICS: no LAN in the neck, no supraclavicular LAN RESPIRATORY: breathing is even & unlabored,  BS CTAB CARDIAC: RRR, no murmur,no extra heart sounds, no edema GI: abdomen soft, normal BS, no masses, no tenderness, no hepatomegaly, no splenomegaly EXTREMITIES:  Able to move x 4 extremities  PSYCHIATRIC: the patient is alert & oriented to person, affect & behavior appropriate  LABS/RADIOLOGY: Labs reviewed: 03/31/15  chest x-ray shows no acute cardiopulmonary pathology 01/10/15  WBC 6.2 hemoglobin 11.7 hematocrit 36.8 MCV 87.4 with left count 276 sodium 144 potassium 4.5 glucose 78 BUN 20 creatinine 1.05 calcium 8.9 Basic Metabolic Panel:  Recent Labs  11/06/14 0909  11/27/14 1108 11/27/14 1442 11/28/14 0500 11/30/14 0732  NA 140  < > 141 139 140 137  K 3.9  < > 4.7 4.9 5.1 4.2  CL 104  < > 107  --  108 105  CO2 29  < > 27  --  26 26  GLUCOSE 101*  < > 94 91 116* 93  BUN 20  < > 23*  --  27* 20  CREATININE 1.05  < > 1.13*  --  1.35* 0.98  CALCIUM 8.9  < > 9.0  --  8.0* 8.1*  MG 1.9  --   --   --   --   --   < > = values in this interval not displayed. Liver Function Tests:  Recent Labs  10/23/14 0543 11/05/14 0805 11/27/14 1108  AST 21 22 24   ALT 18 16 17   ALKPHOS 53 73 153*  BILITOT 0.4 0.6 0.6  PROT 5.6* 6.7 5.6*  ALBUMIN 3.5 4.4 2.9*   CBC:  Recent Labs  11/05/14 0805 11/06/14 0909  11/27/14 1108  11/30/14 0732 12/01/14 0604 12/02/14 0606  WBC 16.5* 13.1*  < > 13.0*  < > 13.0* 11.4* 8.5  NEUTROABS 14.8* 10.7*  --  10.7*  --   --   --   --   HGB 14.8 14.4  < > 10.5*  < > 8.3* 8.2* 8.9*  HCT 47.2* 45.6  < > 35.1*  < > 26.4* 26.5* 29.2*  MCV 92.5 91.0  < > 90.5  < > 89.2 91.1 91.8  PLT 237 227  < > 468*  < > 344 371 381  < > = values in this interval not displayed.   Cardiac Enzymes:  Recent Labs  10/08/14 0255 10/08/14 1000 10/08/14 1448  TROPONINI <0.03 <0.03 <0.03   CBG:  Recent Labs  10/23/14 0720 10/24/14 0734 10/25/14 0731  GLUCAP 88 84 83     ASSESSMENT/PLAN:  Left hip periprosthetic fracture S/P revision of left hip  arthroplasty - for Home health PT continue tramadol 50 mg  1-2 tabs by mouth every 6 hours when necessary for pain  Alzheimer's dementia - for Home health ST for cognition; continue supportive care; continue Aricept 10 mg PO Q D  COPD - stable   Anemia, acute blood loss -  hgb 11.7; stable  Depression - mood is a stable; continue Paxil 40 mg by mouth every morning  Constipation - continue senna S1 tab by mouth Q HS  Insomnia - continue Melatonin 5 mg 1 tab PO Q HS and Restoril was discontinued  Thyroid mass - incidentally noted on CT, repeat ultrasound in 2 months  Vitamin D deficiency - continue vitamin D 1000 units 1 by mouth daily  Protein-calorie malnutrition, severe - albumin 2.9; continue supplementation  Vitamin D deficiency - continue vitamin D3 1000 units 1 tablet by mouth daily    I have filled out patient's discharge paperwork and written prescriptions.  Patient will receive home health PT and ST.  Total discharge time: Less than 30 minutes  Discharge time involved coordination of the discharge process with Education officer, museum, nursing staff and therapy department. Medical justification for home health services verified.     [facility], NP [employer] [phone]

## 2015-05-19 DIAGNOSIS — M25551 Pain in right hip: Secondary | ICD-10-CM | POA: Diagnosis not present

## 2015-05-19 DIAGNOSIS — F332 Major depressive disorder, recurrent severe without psychotic features: Secondary | ICD-10-CM | POA: Diagnosis not present

## 2015-05-19 DIAGNOSIS — S32592A Other specified fracture of left pubis, initial encounter for closed fracture: Secondary | ICD-10-CM | POA: Diagnosis not present

## 2015-05-19 DIAGNOSIS — N390 Urinary tract infection, site not specified: Secondary | ICD-10-CM | POA: Diagnosis not present

## 2015-05-19 DIAGNOSIS — R0902 Hypoxemia: Secondary | ICD-10-CM | POA: Diagnosis not present

## 2015-05-19 DIAGNOSIS — Z87891 Personal history of nicotine dependence: Secondary | ICD-10-CM | POA: Diagnosis not present

## 2015-05-19 DIAGNOSIS — J441 Chronic obstructive pulmonary disease with (acute) exacerbation: Secondary | ICD-10-CM | POA: Diagnosis not present

## 2015-05-19 DIAGNOSIS — W1839XA Other fall on same level, initial encounter: Secondary | ICD-10-CM | POA: Diagnosis not present

## 2015-05-19 DIAGNOSIS — S299XXA Unspecified injury of thorax, initial encounter: Secondary | ICD-10-CM | POA: Diagnosis not present

## 2015-05-19 DIAGNOSIS — M25552 Pain in left hip: Secondary | ICD-10-CM | POA: Diagnosis not present

## 2015-05-20 ENCOUNTER — Encounter: Payer: Self-pay | Admitting: *Deleted

## 2015-05-20 DIAGNOSIS — J441 Chronic obstructive pulmonary disease with (acute) exacerbation: Secondary | ICD-10-CM | POA: Diagnosis not present

## 2015-05-20 DIAGNOSIS — S32592A Other specified fracture of left pubis, initial encounter for closed fracture: Secondary | ICD-10-CM | POA: Diagnosis not present

## 2015-05-20 DIAGNOSIS — N390 Urinary tract infection, site not specified: Secondary | ICD-10-CM | POA: Diagnosis not present

## 2015-05-21 DIAGNOSIS — N390 Urinary tract infection, site not specified: Secondary | ICD-10-CM | POA: Diagnosis not present

## 2015-05-21 DIAGNOSIS — F339 Major depressive disorder, recurrent, unspecified: Secondary | ICD-10-CM | POA: Diagnosis not present

## 2015-05-21 DIAGNOSIS — R0902 Hypoxemia: Secondary | ICD-10-CM | POA: Diagnosis present

## 2015-05-21 DIAGNOSIS — G309 Alzheimer's disease, unspecified: Secondary | ICD-10-CM | POA: Diagnosis present

## 2015-05-21 DIAGNOSIS — R262 Difficulty in walking, not elsewhere classified: Secondary | ICD-10-CM | POA: Diagnosis not present

## 2015-05-21 DIAGNOSIS — R296 Repeated falls: Secondary | ICD-10-CM | POA: Diagnosis not present

## 2015-05-21 DIAGNOSIS — Z23 Encounter for immunization: Secondary | ICD-10-CM | POA: Diagnosis not present

## 2015-05-21 DIAGNOSIS — Z7401 Bed confinement status: Secondary | ICD-10-CM | POA: Diagnosis not present

## 2015-05-21 DIAGNOSIS — R4182 Altered mental status, unspecified: Secondary | ICD-10-CM | POA: Diagnosis not present

## 2015-05-21 DIAGNOSIS — Z79899 Other long term (current) drug therapy: Secondary | ICD-10-CM | POA: Diagnosis not present

## 2015-05-21 DIAGNOSIS — J441 Chronic obstructive pulmonary disease with (acute) exacerbation: Secondary | ICD-10-CM | POA: Diagnosis not present

## 2015-05-21 DIAGNOSIS — M6281 Muscle weakness (generalized): Secondary | ICD-10-CM | POA: Diagnosis not present

## 2015-05-21 DIAGNOSIS — R41841 Cognitive communication deficit: Secondary | ICD-10-CM | POA: Diagnosis not present

## 2015-05-21 DIAGNOSIS — S32592A Other specified fracture of left pubis, initial encounter for closed fracture: Secondary | ICD-10-CM | POA: Diagnosis not present

## 2015-05-21 DIAGNOSIS — F0280 Dementia in other diseases classified elsewhere without behavioral disturbance: Secondary | ICD-10-CM | POA: Diagnosis present

## 2015-05-21 DIAGNOSIS — R404 Transient alteration of awareness: Secondary | ICD-10-CM | POA: Diagnosis not present

## 2015-05-21 DIAGNOSIS — F332 Major depressive disorder, recurrent severe without psychotic features: Secondary | ICD-10-CM | POA: Diagnosis present

## 2015-05-21 DIAGNOSIS — Z87891 Personal history of nicotine dependence: Secondary | ICD-10-CM | POA: Diagnosis not present

## 2015-05-24 DIAGNOSIS — G4700 Insomnia, unspecified: Secondary | ICD-10-CM | POA: Diagnosis not present

## 2015-05-24 DIAGNOSIS — M6281 Muscle weakness (generalized): Secondary | ICD-10-CM | POA: Diagnosis not present

## 2015-05-24 DIAGNOSIS — R404 Transient alteration of awareness: Secondary | ICD-10-CM | POA: Diagnosis not present

## 2015-05-24 DIAGNOSIS — F329 Major depressive disorder, single episode, unspecified: Secondary | ICD-10-CM | POA: Diagnosis not present

## 2015-05-24 DIAGNOSIS — F339 Major depressive disorder, recurrent, unspecified: Secondary | ICD-10-CM | POA: Diagnosis not present

## 2015-05-24 DIAGNOSIS — R41841 Cognitive communication deficit: Secondary | ICD-10-CM | POA: Diagnosis not present

## 2015-05-24 DIAGNOSIS — G309 Alzheimer's disease, unspecified: Secondary | ICD-10-CM | POA: Diagnosis not present

## 2015-05-24 DIAGNOSIS — R262 Difficulty in walking, not elsewhere classified: Secondary | ICD-10-CM | POA: Diagnosis not present

## 2015-05-24 DIAGNOSIS — Z23 Encounter for immunization: Secondary | ICD-10-CM | POA: Diagnosis not present

## 2015-05-24 DIAGNOSIS — M25552 Pain in left hip: Secondary | ICD-10-CM | POA: Diagnosis not present

## 2015-05-24 DIAGNOSIS — R296 Repeated falls: Secondary | ICD-10-CM | POA: Diagnosis not present

## 2015-05-24 DIAGNOSIS — J441 Chronic obstructive pulmonary disease with (acute) exacerbation: Secondary | ICD-10-CM | POA: Diagnosis not present

## 2015-05-24 DIAGNOSIS — S32592A Other specified fracture of left pubis, initial encounter for closed fracture: Secondary | ICD-10-CM | POA: Diagnosis not present

## 2015-05-24 DIAGNOSIS — Z7401 Bed confinement status: Secondary | ICD-10-CM | POA: Diagnosis not present

## 2015-05-24 DIAGNOSIS — G3184 Mild cognitive impairment, so stated: Secondary | ICD-10-CM | POA: Diagnosis not present

## 2015-05-24 DIAGNOSIS — N390 Urinary tract infection, site not specified: Secondary | ICD-10-CM | POA: Diagnosis not present

## 2015-05-29 DIAGNOSIS — M6281 Muscle weakness (generalized): Secondary | ICD-10-CM | POA: Diagnosis not present

## 2015-05-29 DIAGNOSIS — M25552 Pain in left hip: Secondary | ICD-10-CM | POA: Diagnosis not present

## 2015-05-29 DIAGNOSIS — R262 Difficulty in walking, not elsewhere classified: Secondary | ICD-10-CM | POA: Diagnosis not present

## 2015-05-29 DIAGNOSIS — G3184 Mild cognitive impairment, so stated: Secondary | ICD-10-CM | POA: Diagnosis not present

## 2015-06-02 DIAGNOSIS — G3184 Mild cognitive impairment, so stated: Secondary | ICD-10-CM | POA: Diagnosis not present

## 2015-06-02 DIAGNOSIS — M25552 Pain in left hip: Secondary | ICD-10-CM | POA: Diagnosis not present

## 2015-06-02 DIAGNOSIS — M6281 Muscle weakness (generalized): Secondary | ICD-10-CM | POA: Diagnosis not present

## 2015-06-02 DIAGNOSIS — R262 Difficulty in walking, not elsewhere classified: Secondary | ICD-10-CM | POA: Diagnosis not present

## 2015-06-04 DIAGNOSIS — M25552 Pain in left hip: Secondary | ICD-10-CM | POA: Diagnosis not present

## 2015-06-04 DIAGNOSIS — R262 Difficulty in walking, not elsewhere classified: Secondary | ICD-10-CM | POA: Diagnosis not present

## 2015-06-04 DIAGNOSIS — G3184 Mild cognitive impairment, so stated: Secondary | ICD-10-CM | POA: Diagnosis not present

## 2015-06-04 DIAGNOSIS — M6281 Muscle weakness (generalized): Secondary | ICD-10-CM | POA: Diagnosis not present

## 2015-06-10 DIAGNOSIS — G3184 Mild cognitive impairment, so stated: Secondary | ICD-10-CM | POA: Diagnosis not present

## 2015-06-10 DIAGNOSIS — M25552 Pain in left hip: Secondary | ICD-10-CM | POA: Diagnosis not present

## 2015-06-10 DIAGNOSIS — R262 Difficulty in walking, not elsewhere classified: Secondary | ICD-10-CM | POA: Diagnosis not present

## 2015-06-10 DIAGNOSIS — M6281 Muscle weakness (generalized): Secondary | ICD-10-CM | POA: Diagnosis not present

## 2015-06-16 ENCOUNTER — Encounter: Payer: Self-pay | Admitting: *Deleted

## 2015-06-17 DIAGNOSIS — M25552 Pain in left hip: Secondary | ICD-10-CM | POA: Diagnosis not present

## 2015-06-17 DIAGNOSIS — G3184 Mild cognitive impairment, so stated: Secondary | ICD-10-CM | POA: Diagnosis not present

## 2015-06-17 DIAGNOSIS — M6281 Muscle weakness (generalized): Secondary | ICD-10-CM | POA: Diagnosis not present

## 2015-06-17 DIAGNOSIS — R262 Difficulty in walking, not elsewhere classified: Secondary | ICD-10-CM | POA: Diagnosis not present

## 2015-06-19 DIAGNOSIS — M6281 Muscle weakness (generalized): Secondary | ICD-10-CM | POA: Diagnosis not present

## 2015-06-19 DIAGNOSIS — G3184 Mild cognitive impairment, so stated: Secondary | ICD-10-CM | POA: Diagnosis not present

## 2015-06-19 DIAGNOSIS — R262 Difficulty in walking, not elsewhere classified: Secondary | ICD-10-CM | POA: Diagnosis not present

## 2015-06-19 DIAGNOSIS — M25552 Pain in left hip: Secondary | ICD-10-CM | POA: Diagnosis not present

## 2015-06-20 DIAGNOSIS — G309 Alzheimer's disease, unspecified: Secondary | ICD-10-CM | POA: Diagnosis not present

## 2015-06-20 DIAGNOSIS — F329 Major depressive disorder, single episode, unspecified: Secondary | ICD-10-CM | POA: Diagnosis not present

## 2015-06-20 DIAGNOSIS — G4700 Insomnia, unspecified: Secondary | ICD-10-CM | POA: Diagnosis not present

## 2015-06-24 DIAGNOSIS — R262 Difficulty in walking, not elsewhere classified: Secondary | ICD-10-CM | POA: Diagnosis not present

## 2015-06-24 DIAGNOSIS — M6281 Muscle weakness (generalized): Secondary | ICD-10-CM | POA: Diagnosis not present

## 2015-06-24 DIAGNOSIS — M25552 Pain in left hip: Secondary | ICD-10-CM | POA: Diagnosis not present

## 2015-06-24 DIAGNOSIS — G3184 Mild cognitive impairment, so stated: Secondary | ICD-10-CM | POA: Diagnosis not present

## 2015-07-02 DIAGNOSIS — G3184 Mild cognitive impairment, so stated: Secondary | ICD-10-CM | POA: Diagnosis not present

## 2015-07-02 DIAGNOSIS — M25552 Pain in left hip: Secondary | ICD-10-CM | POA: Diagnosis not present

## 2015-07-02 DIAGNOSIS — M6281 Muscle weakness (generalized): Secondary | ICD-10-CM | POA: Diagnosis not present

## 2015-07-02 DIAGNOSIS — R262 Difficulty in walking, not elsewhere classified: Secondary | ICD-10-CM | POA: Diagnosis not present

## 2015-07-15 ENCOUNTER — Encounter: Payer: Self-pay | Admitting: *Deleted

## 2015-07-16 DIAGNOSIS — M25552 Pain in left hip: Secondary | ICD-10-CM | POA: Diagnosis not present

## 2015-07-16 DIAGNOSIS — M6281 Muscle weakness (generalized): Secondary | ICD-10-CM | POA: Diagnosis not present

## 2015-07-16 DIAGNOSIS — G3184 Mild cognitive impairment, so stated: Secondary | ICD-10-CM | POA: Diagnosis not present

## 2015-07-16 DIAGNOSIS — R262 Difficulty in walking, not elsewhere classified: Secondary | ICD-10-CM | POA: Diagnosis not present

## 2015-07-28 DIAGNOSIS — G4700 Insomnia, unspecified: Secondary | ICD-10-CM | POA: Diagnosis not present

## 2015-08-04 DIAGNOSIS — R5381 Other malaise: Secondary | ICD-10-CM | POA: Diagnosis not present

## 2015-08-04 DIAGNOSIS — G3184 Mild cognitive impairment, so stated: Secondary | ICD-10-CM | POA: Diagnosis not present

## 2015-08-04 DIAGNOSIS — M6281 Muscle weakness (generalized): Secondary | ICD-10-CM | POA: Diagnosis not present

## 2015-08-04 DIAGNOSIS — S329XXD Fracture of unspecified parts of lumbosacral spine and pelvis, subsequent encounter for fracture with routine healing: Secondary | ICD-10-CM | POA: Diagnosis not present

## 2015-08-07 DIAGNOSIS — N390 Urinary tract infection, site not specified: Secondary | ICD-10-CM | POA: Diagnosis not present

## 2015-08-15 DIAGNOSIS — B379 Candidiasis, unspecified: Secondary | ICD-10-CM | POA: Diagnosis not present

## 2015-08-15 DIAGNOSIS — G309 Alzheimer's disease, unspecified: Secondary | ICD-10-CM | POA: Diagnosis not present

## 2015-08-15 DIAGNOSIS — J441 Chronic obstructive pulmonary disease with (acute) exacerbation: Secondary | ICD-10-CM | POA: Diagnosis not present

## 2015-08-15 DIAGNOSIS — G4700 Insomnia, unspecified: Secondary | ICD-10-CM | POA: Diagnosis not present

## 2015-08-15 DIAGNOSIS — F339 Major depressive disorder, recurrent, unspecified: Secondary | ICD-10-CM | POA: Diagnosis not present

## 2015-08-19 DIAGNOSIS — J441 Chronic obstructive pulmonary disease with (acute) exacerbation: Secondary | ICD-10-CM | POA: Diagnosis not present

## 2015-08-19 DIAGNOSIS — R05 Cough: Secondary | ICD-10-CM | POA: Diagnosis not present

## 2015-08-19 DIAGNOSIS — B379 Candidiasis, unspecified: Secondary | ICD-10-CM | POA: Diagnosis not present

## 2015-08-19 DIAGNOSIS — R296 Repeated falls: Secondary | ICD-10-CM | POA: Diagnosis not present

## 2015-08-19 DIAGNOSIS — F339 Major depressive disorder, recurrent, unspecified: Secondary | ICD-10-CM | POA: Diagnosis not present

## 2015-08-19 DIAGNOSIS — G4700 Insomnia, unspecified: Secondary | ICD-10-CM | POA: Diagnosis not present

## 2015-08-19 DIAGNOSIS — H04123 Dry eye syndrome of bilateral lacrimal glands: Secondary | ICD-10-CM | POA: Diagnosis not present

## 2015-08-19 DIAGNOSIS — G309 Alzheimer's disease, unspecified: Secondary | ICD-10-CM | POA: Diagnosis not present

## 2015-08-22 DIAGNOSIS — J441 Chronic obstructive pulmonary disease with (acute) exacerbation: Secondary | ICD-10-CM | POA: Diagnosis not present

## 2015-09-08 DIAGNOSIS — R05 Cough: Secondary | ICD-10-CM | POA: Diagnosis not present

## 2015-09-08 DIAGNOSIS — R0789 Other chest pain: Secondary | ICD-10-CM | POA: Diagnosis not present

## 2015-09-08 DIAGNOSIS — J069 Acute upper respiratory infection, unspecified: Secondary | ICD-10-CM | POA: Diagnosis not present

## 2015-09-10 DIAGNOSIS — R52 Pain, unspecified: Secondary | ICD-10-CM | POA: Diagnosis not present

## 2015-09-10 DIAGNOSIS — N61 Mastitis without abscess: Secondary | ICD-10-CM | POA: Diagnosis not present

## 2015-09-10 DIAGNOSIS — R609 Edema, unspecified: Secondary | ICD-10-CM | POA: Diagnosis not present

## 2015-09-10 DIAGNOSIS — E119 Type 2 diabetes mellitus without complications: Secondary | ICD-10-CM | POA: Diagnosis not present

## 2015-09-16 DIAGNOSIS — J441 Chronic obstructive pulmonary disease with (acute) exacerbation: Secondary | ICD-10-CM | POA: Diagnosis not present

## 2015-09-16 DIAGNOSIS — F0280 Dementia in other diseases classified elsewhere without behavioral disturbance: Secondary | ICD-10-CM | POA: Diagnosis not present

## 2015-09-16 DIAGNOSIS — S32592D Other specified fracture of left pubis, subsequent encounter for fracture with routine healing: Secondary | ICD-10-CM | POA: Diagnosis not present

## 2015-09-16 DIAGNOSIS — G309 Alzheimer's disease, unspecified: Secondary | ICD-10-CM | POA: Diagnosis not present

## 2015-09-30 ENCOUNTER — Encounter: Payer: Self-pay | Admitting: Family Medicine

## 2015-09-30 ENCOUNTER — Ambulatory Visit (INDEPENDENT_AMBULATORY_CARE_PROVIDER_SITE_OTHER): Payer: Medicare Other | Admitting: Family Medicine

## 2015-09-30 VITALS — BP 141/74 | HR 74 | Temp 97.3°F | Resp 18 | Ht 64.0 in | Wt 158.0 lb

## 2015-09-30 DIAGNOSIS — N393 Stress incontinence (female) (male): Secondary | ICD-10-CM | POA: Diagnosis not present

## 2015-09-30 DIAGNOSIS — I10 Essential (primary) hypertension: Secondary | ICD-10-CM | POA: Diagnosis not present

## 2015-09-30 DIAGNOSIS — R739 Hyperglycemia, unspecified: Secondary | ICD-10-CM

## 2015-09-30 DIAGNOSIS — R32 Unspecified urinary incontinence: Secondary | ICD-10-CM

## 2015-09-30 DIAGNOSIS — B351 Tinea unguium: Secondary | ICD-10-CM | POA: Diagnosis not present

## 2015-09-30 DIAGNOSIS — E079 Disorder of thyroid, unspecified: Secondary | ICD-10-CM

## 2015-09-30 DIAGNOSIS — F0280 Dementia in other diseases classified elsewhere without behavioral disturbance: Secondary | ICD-10-CM

## 2015-09-30 DIAGNOSIS — F32A Depression, unspecified: Secondary | ICD-10-CM

## 2015-09-30 DIAGNOSIS — M549 Dorsalgia, unspecified: Secondary | ICD-10-CM

## 2015-09-30 DIAGNOSIS — G309 Alzheimer's disease, unspecified: Secondary | ICD-10-CM

## 2015-09-30 DIAGNOSIS — F329 Major depressive disorder, single episode, unspecified: Secondary | ICD-10-CM

## 2015-09-30 DIAGNOSIS — Z87440 Personal history of urinary (tract) infections: Secondary | ICD-10-CM | POA: Diagnosis not present

## 2015-09-30 DIAGNOSIS — R358 Other polyuria: Secondary | ICD-10-CM

## 2015-09-30 DIAGNOSIS — G8929 Other chronic pain: Secondary | ICD-10-CM

## 2015-09-30 DIAGNOSIS — G4700 Insomnia, unspecified: Secondary | ICD-10-CM

## 2015-09-30 DIAGNOSIS — R3589 Other polyuria: Secondary | ICD-10-CM

## 2015-09-30 LAB — COMPLETE METABOLIC PANEL WITH GFR
ALT: 15 U/L (ref 6–29)
AST: 23 U/L (ref 10–35)
Albumin: 4.1 g/dL (ref 3.6–5.1)
Alkaline Phosphatase: 103 U/L (ref 33–130)
BILIRUBIN TOTAL: 0.3 mg/dL (ref 0.2–1.2)
BUN: 17 mg/dL (ref 7–25)
CALCIUM: 9.7 mg/dL (ref 8.6–10.4)
CO2: 28 mmol/L (ref 20–31)
CREATININE: 1.36 mg/dL — AB (ref 0.60–0.88)
Chloride: 102 mmol/L (ref 98–110)
GFR, EST AFRICAN AMERICAN: 40 mL/min — AB (ref >=60)
GFR, Est Non African American: 35 mL/min — ABNORMAL LOW (ref >=60)
Glucose, Bld: 88 mg/dL (ref 65–99)
Potassium: 4.7 mmol/L (ref 3.5–5.3)
Sodium: 143 mmol/L (ref 135–146)
TOTAL PROTEIN: 6.3 g/dL (ref 6.1–8.1)

## 2015-09-30 LAB — CBC WITH DIFFERENTIAL/PLATELET
BASOS ABS: 0 10*3/uL (ref 0.0–0.1)
Basophils Relative: 0 % (ref 0–1)
EOS PCT: 5 % (ref 0–5)
Eosinophils Absolute: 0.4 10*3/uL (ref 0.0–0.7)
HEMATOCRIT: 49.4 % — AB (ref 36.0–46.0)
HEMOGLOBIN: 15.9 g/dL — AB (ref 12.0–15.0)
LYMPHS ABS: 1.6 10*3/uL (ref 0.7–4.0)
LYMPHS PCT: 19 % (ref 12–46)
MCH: 27 pg (ref 26.0–34.0)
MCHC: 32.2 g/dL (ref 30.0–36.0)
MCV: 83.9 fL (ref 78.0–100.0)
MPV: 10.6 fL (ref 8.6–12.4)
Monocytes Absolute: 0.7 10*3/uL (ref 0.1–1.0)
Monocytes Relative: 9 % (ref 3–12)
NEUTROS ABS: 5.6 10*3/uL (ref 1.7–7.7)
NEUTROS PCT: 67 % (ref 43–77)
Platelets: 269 10*3/uL (ref 150–400)
RBC: 5.89 MIL/uL — AB (ref 3.87–5.11)
RDW: 14.5 % (ref 11.5–15.5)
WBC: 8.3 10*3/uL (ref 4.0–10.5)

## 2015-09-30 LAB — GLUCOSE, CAPILLARY: Glucose-Capillary: 85 mg/dL (ref 65–99)

## 2015-09-30 MED ORDER — TRAZODONE HCL 50 MG PO TABS: 50.0000 mg | ORAL_TABLET | Freq: Every day | ORAL | Status: DC

## 2015-09-30 MED ORDER — DONEPEZIL HCL 10 MG PO TABS: 10.0000 mg | ORAL_TABLET | Freq: Every day | ORAL | Status: DC

## 2015-09-30 MED ORDER — PAROXETINE HCL 40 MG PO TABS: 40.0000 mg | ORAL_TABLET | ORAL | Status: DC

## 2015-09-30 NOTE — Progress Notes (Signed)
Subjective:    Patient ID: Laura [name], female    DOB: [birth year], 82 y.o.   MRN: [id number]  HPI [name], a 82 year old female presents accompanied by Ronnald Ramp, her primary care giver to establish care. [name] states that [name] was a resident at [facility] prior to living in her home. She states that she has been caring for [name] full time for greater than 1 month.  standard provider. [name] states that patient had been under the care of provider at [facility] care prior to establishing care.  Has been out for over a month.   [name] has a history of Alzheimer's dementia, depression, and chronic back pain. Previous notes suggest that patient had a left hip periprosthetic fracture and a revision of left hip arthroplasty on 12/02/2014. She was in [facility] for rehabilitation following surgery.  Ms. [name] as a history of Alzheimer's dementia and is here for evaluation and treatment of cognitive problems. . Family and patient report problems with wondering and aggitation. Her caregiver monitors the use of medications   Activities of Daily Living (ADLs):   She is independent in the following: feeding Requires assistance with the following: ambulation, bathing and hygiene, continence, grooming, toileting and dressing  Patient also has a history of depression. She is currently taking Paxil 40 mg daily. Patient's caregiver reports that she has not been under the care of a therapist since leaving the nursing facility. It is difficult for patient to answer questions concerning depression.  Possible organic causes contributing include medications, neuro.    Past Medical History  Diagnosis Date  . Hypertension   . Arthritis   . Alzheimer's dementia   . Anxiety   . Depression   . COPD (chronic obstructive pulmonary disease) ([place])   . Presence of permanent cardiac pacemaker     LOOP RECORDER DEVICE  . Syncope   . LBBB (left  bundle branch block)    Past Surgical History  Procedure Laterality Date  . Shoulder surgery      Left   . Loop recorder implant N/A 06/22/2012    Procedure: LOOP RECORDER IMPLANT;  Surgeon: Sanda Klein, MD;  Location: [place] CATH LAB;  Service: Cardiovascular;  Laterality: N/A;  . Total hip arthroplasty Left 11/07/2014    Procedure: Left hip anterior hemiarthroplasty;  Surgeon: Leandrew Koyanagi, MD;  Location: [place];  Service: Orthopedics;  Laterality: Left;  . Anterior hip revision Left 11/27/2014    Procedure: REVISION OF LEFT HIP REPLACEMENT;  Surgeon: Leandrew Koyanagi, MD;  Location: [place];  Service: Orthopedics;  Laterality: Left;   Social History   Social History  . Marital Status: Divorced    Spouse Name: N/A  . Number of Children: N/A  . Years of Education: N/A   Occupational History  . Not on file.   Social History Main Topics  . Smoking status: Former Research scientist (life sciences)  . Smokeless tobacco: Never Used  . Alcohol Use: No  . Drug Use: No  . Sexual Activity: No   Other Topics Concern  . Not on file   Social History Narrative   Review of Systems  Constitutional: Negative for fever, appetite change, fatigue and unexpected weight change.  HENT: Negative.   Eyes: Negative.   Respiratory: Negative.  Negative for choking and chest tightness.   Cardiovascular: Negative.  Negative for chest pain, palpitations and leg swelling.  Gastrointestinal: Negative.  Negative for blood in stool.  Endocrine:  Negative.  Negative for polydipsia, polyphagia and polyuria.  Genitourinary: Negative.        Caregiver reports incontinence of urine and stool.   Musculoskeletal: Negative.   Skin: Negative.   Allergic/Immunologic: Negative.   Neurological: Negative.   Hematological: Negative.   Psychiatric/Behavioral: Positive for confusion and agitation.       Patient states that she wants to be around people that are in her age group. She states that it irritates her when others mention her dementia.         Objective:   Physical Exam  Constitutional: She appears well-developed.  HENT:  Head: Normocephalic and atraumatic.  Right Ear: External ear normal.  Left Ear: External ear normal.  Mouth/Throat: Oropharynx is clear and moist.  Eyes: Conjunctivae and EOM are normal. Pupils are equal, round, and reactive to light.  Neck: Normal range of motion. Neck supple.  Cardiovascular: Normal rate, normal heart sounds and intact distal pulses.   Bilateral lower extremity edema   Pulmonary/Chest: Effort normal and breath sounds normal.  Abdominal: Soft. Bowel sounds are normal.  Musculoskeletal: Normal range of motion.  Skin:  Nails to right hand green/yellow, thickened, and sore to touch      BP 141/74 mmHg  Pulse 74  Temp(Src) 97.3 F (36.3 C) (Oral)  Resp 18  Ht 5\' 4"  (1.626 m)  Wt 158 lb (71.668 kg)  BMI 27.11 kg/m2  SpO2 95% Assessment & Plan:   1. Essential hypertension Blood pressure is at goal without medications. The patient's caregiver was  asked to make an attempt to improve diet and activity patterns to avoid further medical management of this problem. - COMPLETE METABOLIC PANEL WITH GFR  2. Nail fungus - Ambulatory referral to Dermatology  3. Chronic back pain greater than 3 months duration Continue medication as previously prescribed. I will discontinue Tramadol due to a potential interaction with Paxil.   4. Incontinence in female Advised patient's caregiver on the importance of protecting her skin. She will need adult diapers and skin protection. She will need to keep patient on a toileting schedule in order to protect skin.   5. Alzheimer's dementia MMSE - Mini Mental State Exam 09/30/2015  Orientation to time 3  Orientation to Place 3  Registration 3  Attention/ Calculation 1  Recall 3  Language- name 2 objects 2  Language- repeat 1  Language- follow 3 step command 3  Language- read & follow direction 1  Write a sentence 1  Copy design 0  Total score 21    - donepezil (ARICEPT) 10 MG tablet; Take 1 tablet (10 mg total) by mouth at bedtime.  Dispense: 30 tablet; Refill: 1  6. Thyroid mass - TSH  7. History of recurrent UTIs - CBC with Differential - POCT urinalysis dipstick  8. Depression - PARoxetine (PAXIL) 40 MG tablet; Take 1 tablet (40 mg total) by mouth every morning.  Dispense: 30 tablet; Refill: 2  9. Insomnia - traZODone (DESYREL) 50 MG tablet; Take 1 tablet (50 mg total) by mouth at bedtime.  Dispense: 30 tablet; Refill: 1  10. Hyperglycemia Reviewed labs, patient has a history of hyperglycemia, will check CBG.  - Glucose (CBG)   RTC: 1 month for chronic conditions. Will review medical records as they become available for further history.    The patient was given clear instructions to go to ER or return to medical center if symptoms do not improve, worsen or new problems develop. The patient verbalized understanding. Will notify patient with  laboratory results.     Dorena Dew, FNP

## 2015-09-30 NOTE — Patient Instructions (Signed)
Alzheimer Disease Alzheimer disease is a mental disorder. It causes memory loss and loss of other mental functions, such as learning, thinking, problem solving, communicating, and completing tasks. The mental losses interfere with the ability to perform daily activities at work, at home, or in social situations. Alzheimer disease usually starts in a person's late 46s or early 5s but can start earlier in life (familial form). The mental changes caused by this disease are permanent and worsen over time. As the illness progresses, the ability to do even the simplest things is lost. Survival with Alzheimer disease ranges from several years to as long as 20 years. CAUSES Alzheimer disease is caused by abnormally high levels of a protein (beta-amyloid) in the brain. This protein forms very small deposits within and around the brain's nerve cells. These deposits prevent the nerve cells from working properly. Experts are not certain what causes the beta-amyloid deposits in this disease. RISK FACTORS The following major risk factors have been identified:  Increasing age.  Certain genetic variations, such as Down syndrome (trisomy 21). SYMPTOMS In the early stages of Alzheimer disease, you are still able to perform daily activities but need greater effort, more time, or memory aids. Early symptoms include:  Mild memory loss of recent events, names, or phone numbers.  Loss of objects.  Minor loss of vocabulary.  Difficulty with complex tasks, such as paying bills or driving in unfamiliar locations. Other mental functions deteriorate as the disease worsens. These changes slowly go from mild to severe. Symptoms at this stage include:  Difficulty remembering. You may not be able to recall personal information such as your address and telephone number. You may become confused about the date, the season of the year, or your location.  Difficulty maintaining attention. You may forget what you wanted to say  during conversations and repeat what you have already said.  Difficulty learning new information or tasks. You may not remember what you read or the name of a new friend you met.  Difficulty counting or doing math. You may have difficulty with complex math problems. You may make mistakes in paying bills or managing your checkbook.  Poor reasoning and judgment. You may make poor decisions or not dress right for the weather.  Difficulty communicating. You may have regular difficulty remembering words, naming objects, expressing yourself clearly, or writing sentences that make sense.  Difficulty performing familiar daily activities. You may get lost driving in familiar locations or need help eating, bathing, dressing, grooming, or using the toilet. You may have difficulty maintaining bladder or bowel control.  Difficulty recognizing familiar faces. You may confuse family members or close friends with one another. You may not recognize a close relative or may mistake strangers for family. Alzheimer disease also may cause changes in personality and behavior. These changes include:   Loss of interest or motivation.  Social withdrawal.  Anxiety.  Difficulty sleeping.  Uncharacteristic anger or combativeness.  A false belief that someone is trying to harm you (paranoia).  Seeing things that are not real (hallucinations).  Agitation. Confusion and disruptive behavior are often worse at night and may be triggered by changes in the environment or acute medical issues. DIAGNOSIS  Alzheimer disease is diagnosed through an assessment by your health care provider. During this assessment, your health care provider will do the following:  Ask you and your family, friends, or caregivers questions about your symptoms, their frequency, their duration and progression, and the effect they are having on your life.  Ask questions about your personal and family medical history and use of alcohol or drugs,  including prescription medicine.  Perform a physical exam and order blood tests and brain imaging exams. Your health care provider may refer you to a specialist for detailed evaluation of your mental functions (neuropsychological testing).  Many different brain disorders, medical conditions, and certain substances can cause symptoms that resemble Alzheimer disease symptoms. These must be ruled out before this disease can be diagnosed. If Alzheimer disease is diagnosed, it will be considered either "possible" or "probable" Alzheimer disease. "Possible" Alzheimer disease means that your symptoms are typical of the disease and no other disorder is causing them. "Probable" Alzheimer disease means that you also have a family history of the disease or genetic test results that support the diagnosis. Certain tests, mostly used in research studies, are highly specific for Alzheimer disease.  TREATMENT  There is currently no cure for this disease. The goals of treatment are to:  Slow down the progression of the disease.  Preserve mental function as long as possible.  Manage behavioral symptoms.  Make life easier for the person with Alzheimer disease and his or her caregivers. The following treatment options are available:  Medicine. Certain medicines may help slow memory loss by changing the level of certain chemicals in the brain. Medicine may also help with behavioral symptoms.  Talk therapy. Talk therapy provides education, support, and memory aids for people with this disease. It is most effective in the early stages of the illness.  Caregiving. Caregivers may be family members, friends, or trained medical professionals. They help the person with Alzheimer disease with daily life activities. Caregiving may take place at home or at a nursing facility.  Family support groups. These provide education, emotional support, and information about community resources to family members who are taking care of  the person with this disease.   This information is not intended to replace advice given to you by your health care provider. Make sure you discuss any questions you have with your health care provider.   Document Released: 03/16/2004 Document Revised: 07/26/2014 Document Reviewed: 11/10/2012 Elsevier Interactive Patient Education 2016 Elsevier Inc. Low-Fiber Diet Fiber is found in fruits, vegetables, and whole grains. A low-fiber diet restricts fibrous foods that are not digested in the small intestine. A diet containing about 10-15 grams of fiber per day is considered low fiber. Low-fiber diets may be used to:  Promote healing and rest the bowel during intestinal flare-ups.  Prevent blockage of a partially obstructed or narrowed gastrointestinal tract.  Reduce fecal weight and volume.  Slow the movement of feces. You may be on a low-fiber diet as a transitional diet following surgery, after an injury (trauma), or because of a short (acute) or lifelong (chronic) illness. Your health care provider will determine the length of time you need to stay on this diet.  WHAT DO I NEED TO KNOW ABOUT A LOW-FIBER DIET? Always check the fiber content on the packaging's Nutrition Facts label, especially on foods from the grains list. Ask your dietitian if you have questions about specific foods that are related to your condition, especially if the food is not listed below. In general, a low-fiber food will have less than 2 g of fiber. WHAT FOODS CAN I EAT? Grains All breads and crackers made with white flour. Sweet rolls, doughnuts, waffles, pancakes, [place] toast, bagels. Pretzels, Melba toast, zwieback. Well-cooked cereals, such as cornmeal, farina, or cream cereals. Dry cereals that do not contain  whole grains, fruit, or nuts, such as refined corn, wheat, rice, and oat cereals. Potatoes prepared any way without skins, plain pastas and noodles, refined white rice. Use white flour for baking and making  sauces. Use allowed list of grains for casseroles, dumplings, and puddings.  Vegetables Strained tomato and vegetable juices. Fresh lettuce, cucumber, spinach. Well-cooked (no skin or pulp) or canned vegetables, such as asparagus, bean sprouts, beets, carrots, green beans, mushrooms, potatoes, pumpkin, spinach, yellow squash, tomato sauce/puree, turnips, yams, and zucchini. Keep servings limited to  cup.  Fruits All fruit juices except prune juice. Cooked or canned fruits without skin and seeds, such as applesauce, apricots, cherries, fruit cocktail, grapefruit, grapes, mandarin oranges, melons, peaches, pears, pineapple, and plums. Fresh fruits without skin, such as apricots, avocados, bananas, melons, pineapple, nectarines, and peaches. Keep servings limited to  cup or 1 piece.  Meat and Other Protein Sources Ground or well-cooked tender beef, ham, veal, lamb, pork, or poultry. Eggs, plain cheese. Fish, oysters, shrimp, lobster, and other seafood. Liver, organ meats. Smooth nut butters. Dairy All milk products and alternative dairy substitutes, such as soy, rice, almond, and coconut, not containing added whole nuts, seeds, or added fruit. Beverages Decaf coffee, fruit, and vegetable juices or smoothies (small amounts, with no pulp or skins, and with fruits from allowed list), sports drinks, herbal tea. Condiments Ketchup, mustard, vinegar, cream sauce, cheese sauce, cocoa powder. Spices in moderation, such as allspice, basil, bay leaves, celery powder or leaves, cinnamon, cumin powder, curry powder, ginger, mace, marjoram, onion or garlic powder, oregano, paprika, parsley flakes, ground pepper, rosemary, sage, savory, tarragon, thyme, and turmeric. Sweets and Desserts Plain cakes and cookies, pie made with allowed fruit, pudding, custard, cream pie. Gelatin, fruit, ice, sherbet, frozen ice pops. Ice cream, ice milk without nuts. Plain hard candy, honey, jelly, molasses, syrup, sugar, chocolate  syrup, gumdrops, marshmallows. Limit overall sugar intake.  Fats and Oil Margarine, butter, cream, mayonnaise, salad oils, plain salad dressings made from allowed foods. Choose healthy fats such as olive oil, canola oil, and omega-3 fatty acids (such as found in salmon or tuna) when possible.  Other Bouillon, broth, or cream soups made from allowed foods. Any strained soup. Casseroles or mixed dishes made with allowed foods. The items listed above may not be a complete list of recommended foods or beverages. Contact your dietitian for more options.  WHAT FOODS ARE NOT RECOMMENDED? Grains All whole wheat and whole grain breads and crackers. Multigrains, rye, bran seeds, nuts, or coconut. Cereals containing whole grains, multigrains, bran, coconut, nuts, raisins. Cooked or dry oatmeal, steel-cut oats. Coarse wheat cereals, granola. Cereals advertised as high fiber. Potato skins. Whole grain pasta, wild or brown rice. Popcorn. Coconut flour. Bran, buckwheat, corn bread, multigrains, rye, wheat germ.  Vegetables Fresh, cooked or canned vegetables, such as artichokes, asparagus, beet greens, broccoli, Brussels sprouts, cabbage, celery, cauliflower, corn, eggplant, kale, legumes or beans, okra, peas, and tomatoes. Avoid large servings of any vegetables, especially raw vegetables.  Fruits Fresh fruits, such as apples with or without skin, berries, cherries, figs, grapes, grapefruit, guavas, kiwis, mangoes, oranges, papayas, pears, persimmons, pineapple, and pomegranate. Prune juice and juices with pulp, stewed or dried prunes. Dried fruits, dates, raisins. Fruit seeds or skins. Avoid large servings of all fresh fruits. Meats and Other Protein Sources Tough, fibrous meats with gristle. Chunky nut butter. Cheese made with seeds, nuts, or other foods not recommended. Nuts, seeds, legumes (beans, including baked beans), dried peas, beans, lentils.  Dairy Yogurt  or cheese that contains nuts, seeds, or added  fruit.  Beverages Fruit juices with high pulp, prune juice. Caffeinated coffee and teas.  Condiments Coconut, maple syrup, pickles, olives. Sweets and Desserts Desserts, cookies, or candies that contain nuts or coconut, chunky peanut butter, dried fruits. Jams, preserves with seeds, marmalade. Large amounts of sugar and sweets. Any other dessert made with fruits from the not recommended list.  Other Soups made from vegetables that are not recommended or that contain other foods not recommended.  The items listed above may not be a complete list of foods and beverages to avoid. Contact your dietitian for more information.   This information is not intended to replace advice given to you by your health care provider. Make sure you discuss any questions you have with your health care provider.   Document Released: 12/25/2001 Document Revised: 07/10/2013 Document Reviewed: 05/28/2013 Elsevier Interactive Patient Education [employer].

## 2015-09-30 NOTE — Progress Notes (Signed)
Patient is here to establish care.  Patient is complaining of right sided pain, currently scaled at a 7.  Patient would like left hand and nail looked at for possible gangrene.

## 2015-10-01 LAB — TSH: TSH: 0.59 m[IU]/L

## 2015-11-05 ENCOUNTER — Ambulatory Visit: Payer: Medicare Other | Admitting: Family Medicine

## 2015-11-06 DIAGNOSIS — R5383 Other fatigue: Secondary | ICD-10-CM | POA: Diagnosis not present

## 2015-11-06 DIAGNOSIS — Z13220 Encounter for screening for lipoid disorders: Secondary | ICD-10-CM | POA: Diagnosis not present

## 2015-11-06 DIAGNOSIS — Z131 Encounter for screening for diabetes mellitus: Secondary | ICD-10-CM | POA: Diagnosis not present

## 2015-11-06 DIAGNOSIS — N39498 Other specified urinary incontinence: Secondary | ICD-10-CM | POA: Diagnosis not present

## 2015-11-06 DIAGNOSIS — F0390 Unspecified dementia without behavioral disturbance: Secondary | ICD-10-CM | POA: Diagnosis not present

## 2015-11-06 DIAGNOSIS — F0280 Dementia in other diseases classified elsewhere without behavioral disturbance: Secondary | ICD-10-CM | POA: Diagnosis not present

## 2015-11-06 DIAGNOSIS — M545 Low back pain: Secondary | ICD-10-CM | POA: Diagnosis not present

## 2015-11-06 DIAGNOSIS — J4521 Mild intermittent asthma with (acute) exacerbation: Secondary | ICD-10-CM | POA: Diagnosis not present

## 2015-11-06 DIAGNOSIS — F3289 Other specified depressive episodes: Secondary | ICD-10-CM | POA: Diagnosis not present

## 2015-12-09 DIAGNOSIS — J45998 Other asthma: Secondary | ICD-10-CM | POA: Diagnosis not present

## 2015-12-09 DIAGNOSIS — F339 Major depressive disorder, recurrent, unspecified: Secondary | ICD-10-CM | POA: Diagnosis not present

## 2015-12-09 DIAGNOSIS — R32 Unspecified urinary incontinence: Secondary | ICD-10-CM | POA: Diagnosis not present

## 2015-12-09 DIAGNOSIS — E860 Dehydration: Secondary | ICD-10-CM | POA: Diagnosis not present

## 2015-12-09 DIAGNOSIS — N390 Urinary tract infection, site not specified: Secondary | ICD-10-CM | POA: Diagnosis not present

## 2015-12-09 DIAGNOSIS — F0280 Dementia in other diseases classified elsewhere without behavioral disturbance: Secondary | ICD-10-CM | POA: Diagnosis not present

## 2015-12-09 DIAGNOSIS — Z880 Allergy status to penicillin: Secondary | ICD-10-CM | POA: Diagnosis not present

## 2015-12-09 DIAGNOSIS — R4182 Altered mental status, unspecified: Secondary | ICD-10-CM | POA: Diagnosis not present

## 2015-12-09 DIAGNOSIS — G309 Alzheimer's disease, unspecified: Secondary | ICD-10-CM | POA: Diagnosis not present

## 2015-12-09 DIAGNOSIS — J45909 Unspecified asthma, uncomplicated: Secondary | ICD-10-CM | POA: Diagnosis not present

## 2015-12-09 DIAGNOSIS — Z79899 Other long term (current) drug therapy: Secondary | ICD-10-CM | POA: Diagnosis not present

## 2015-12-09 DIAGNOSIS — N39498 Other specified urinary incontinence: Secondary | ICD-10-CM | POA: Diagnosis not present

## 2015-12-09 DIAGNOSIS — G308 Other Alzheimer's disease: Secondary | ICD-10-CM | POA: Diagnosis not present

## 2015-12-10 DIAGNOSIS — G308 Other Alzheimer's disease: Secondary | ICD-10-CM | POA: Diagnosis not present

## 2015-12-10 DIAGNOSIS — N39498 Other specified urinary incontinence: Secondary | ICD-10-CM | POA: Diagnosis not present

## 2015-12-10 DIAGNOSIS — G309 Alzheimer's disease, unspecified: Secondary | ICD-10-CM | POA: Diagnosis not present

## 2015-12-10 DIAGNOSIS — J45998 Other asthma: Secondary | ICD-10-CM | POA: Diagnosis not present

## 2015-12-10 DIAGNOSIS — F0280 Dementia in other diseases classified elsewhere without behavioral disturbance: Secondary | ICD-10-CM | POA: Diagnosis not present

## 2015-12-10 DIAGNOSIS — F339 Major depressive disorder, recurrent, unspecified: Secondary | ICD-10-CM | POA: Diagnosis not present

## 2015-12-10 DIAGNOSIS — E860 Dehydration: Secondary | ICD-10-CM | POA: Diagnosis not present

## 2015-12-10 DIAGNOSIS — J45909 Unspecified asthma, uncomplicated: Secondary | ICD-10-CM | POA: Diagnosis not present

## 2015-12-10 DIAGNOSIS — R32 Unspecified urinary incontinence: Secondary | ICD-10-CM | POA: Diagnosis not present

## 2015-12-11 DIAGNOSIS — N39498 Other specified urinary incontinence: Secondary | ICD-10-CM | POA: Diagnosis not present

## 2015-12-11 DIAGNOSIS — R32 Unspecified urinary incontinence: Secondary | ICD-10-CM | POA: Diagnosis not present

## 2015-12-11 DIAGNOSIS — G309 Alzheimer's disease, unspecified: Secondary | ICD-10-CM | POA: Diagnosis not present

## 2015-12-11 DIAGNOSIS — F339 Major depressive disorder, recurrent, unspecified: Secondary | ICD-10-CM | POA: Diagnosis not present

## 2015-12-11 DIAGNOSIS — J45998 Other asthma: Secondary | ICD-10-CM | POA: Diagnosis not present

## 2015-12-11 DIAGNOSIS — E860 Dehydration: Secondary | ICD-10-CM | POA: Diagnosis not present

## 2015-12-11 DIAGNOSIS — F0280 Dementia in other diseases classified elsewhere without behavioral disturbance: Secondary | ICD-10-CM | POA: Diagnosis not present

## 2015-12-11 DIAGNOSIS — G308 Other Alzheimer's disease: Secondary | ICD-10-CM | POA: Diagnosis not present

## 2015-12-11 DIAGNOSIS — J45909 Unspecified asthma, uncomplicated: Secondary | ICD-10-CM | POA: Diagnosis not present

## 2015-12-12 DIAGNOSIS — R278 Other lack of coordination: Secondary | ICD-10-CM | POA: Diagnosis not present

## 2015-12-12 DIAGNOSIS — F29 Unspecified psychosis not due to a substance or known physiological condition: Secondary | ICD-10-CM | POA: Diagnosis not present

## 2015-12-12 DIAGNOSIS — R4189 Other symptoms and signs involving cognitive functions and awareness: Secondary | ICD-10-CM | POA: Diagnosis not present

## 2015-12-12 DIAGNOSIS — E860 Dehydration: Secondary | ICD-10-CM | POA: Diagnosis not present

## 2015-12-12 DIAGNOSIS — F339 Major depressive disorder, recurrent, unspecified: Secondary | ICD-10-CM | POA: Diagnosis not present

## 2015-12-12 DIAGNOSIS — R41841 Cognitive communication deficit: Secondary | ICD-10-CM | POA: Diagnosis not present

## 2015-12-12 DIAGNOSIS — G309 Alzheimer's disease, unspecified: Secondary | ICD-10-CM | POA: Diagnosis not present

## 2015-12-14 DIAGNOSIS — R41841 Cognitive communication deficit: Secondary | ICD-10-CM | POA: Diagnosis not present

## 2015-12-14 DIAGNOSIS — E860 Dehydration: Secondary | ICD-10-CM | POA: Diagnosis not present

## 2015-12-14 DIAGNOSIS — R4189 Other symptoms and signs involving cognitive functions and awareness: Secondary | ICD-10-CM | POA: Diagnosis not present

## 2015-12-14 DIAGNOSIS — R278 Other lack of coordination: Secondary | ICD-10-CM | POA: Diagnosis not present

## 2015-12-14 DIAGNOSIS — G309 Alzheimer's disease, unspecified: Secondary | ICD-10-CM | POA: Diagnosis not present

## 2015-12-14 DIAGNOSIS — F29 Unspecified psychosis not due to a substance or known physiological condition: Secondary | ICD-10-CM | POA: Diagnosis not present

## 2015-12-15 DIAGNOSIS — N390 Urinary tract infection, site not specified: Secondary | ICD-10-CM | POA: Diagnosis not present

## 2015-12-15 DIAGNOSIS — R41841 Cognitive communication deficit: Secondary | ICD-10-CM | POA: Diagnosis not present

## 2015-12-15 DIAGNOSIS — E860 Dehydration: Secondary | ICD-10-CM | POA: Diagnosis not present

## 2015-12-15 DIAGNOSIS — F29 Unspecified psychosis not due to a substance or known physiological condition: Secondary | ICD-10-CM | POA: Diagnosis not present

## 2015-12-15 DIAGNOSIS — R278 Other lack of coordination: Secondary | ICD-10-CM | POA: Diagnosis not present

## 2015-12-15 DIAGNOSIS — R4189 Other symptoms and signs involving cognitive functions and awareness: Secondary | ICD-10-CM | POA: Diagnosis not present

## 2015-12-15 DIAGNOSIS — G309 Alzheimer's disease, unspecified: Secondary | ICD-10-CM | POA: Diagnosis not present

## 2015-12-15 DIAGNOSIS — R531 Weakness: Secondary | ICD-10-CM | POA: Diagnosis not present

## 2015-12-16 DIAGNOSIS — R4189 Other symptoms and signs involving cognitive functions and awareness: Secondary | ICD-10-CM | POA: Diagnosis not present

## 2015-12-16 DIAGNOSIS — F29 Unspecified psychosis not due to a substance or known physiological condition: Secondary | ICD-10-CM | POA: Diagnosis not present

## 2015-12-16 DIAGNOSIS — G309 Alzheimer's disease, unspecified: Secondary | ICD-10-CM | POA: Diagnosis not present

## 2015-12-16 DIAGNOSIS — R278 Other lack of coordination: Secondary | ICD-10-CM | POA: Diagnosis not present

## 2015-12-16 DIAGNOSIS — R41841 Cognitive communication deficit: Secondary | ICD-10-CM | POA: Diagnosis not present

## 2015-12-16 DIAGNOSIS — E860 Dehydration: Secondary | ICD-10-CM | POA: Diagnosis not present

## 2015-12-17 DIAGNOSIS — E860 Dehydration: Secondary | ICD-10-CM | POA: Diagnosis not present

## 2015-12-17 DIAGNOSIS — G309 Alzheimer's disease, unspecified: Secondary | ICD-10-CM | POA: Diagnosis not present

## 2015-12-17 DIAGNOSIS — R278 Other lack of coordination: Secondary | ICD-10-CM | POA: Diagnosis not present

## 2015-12-17 DIAGNOSIS — R4189 Other symptoms and signs involving cognitive functions and awareness: Secondary | ICD-10-CM | POA: Diagnosis not present

## 2015-12-17 DIAGNOSIS — F29 Unspecified psychosis not due to a substance or known physiological condition: Secondary | ICD-10-CM | POA: Diagnosis not present

## 2015-12-17 DIAGNOSIS — R41841 Cognitive communication deficit: Secondary | ICD-10-CM | POA: Diagnosis not present

## 2015-12-18 DIAGNOSIS — F29 Unspecified psychosis not due to a substance or known physiological condition: Secondary | ICD-10-CM | POA: Diagnosis not present

## 2015-12-18 DIAGNOSIS — F339 Major depressive disorder, recurrent, unspecified: Secondary | ICD-10-CM | POA: Diagnosis not present

## 2015-12-18 DIAGNOSIS — G309 Alzheimer's disease, unspecified: Secondary | ICD-10-CM | POA: Diagnosis not present

## 2015-12-18 DIAGNOSIS — R278 Other lack of coordination: Secondary | ICD-10-CM | POA: Diagnosis not present

## 2015-12-18 DIAGNOSIS — R41841 Cognitive communication deficit: Secondary | ICD-10-CM | POA: Diagnosis not present

## 2015-12-18 DIAGNOSIS — E860 Dehydration: Secondary | ICD-10-CM | POA: Diagnosis not present

## 2015-12-18 DIAGNOSIS — R4189 Other symptoms and signs involving cognitive functions and awareness: Secondary | ICD-10-CM | POA: Diagnosis not present

## 2015-12-19 DIAGNOSIS — E860 Dehydration: Secondary | ICD-10-CM | POA: Diagnosis not present

## 2015-12-19 DIAGNOSIS — G309 Alzheimer's disease, unspecified: Secondary | ICD-10-CM | POA: Diagnosis not present

## 2015-12-19 DIAGNOSIS — R278 Other lack of coordination: Secondary | ICD-10-CM | POA: Diagnosis not present

## 2015-12-19 DIAGNOSIS — R41841 Cognitive communication deficit: Secondary | ICD-10-CM | POA: Diagnosis not present

## 2015-12-19 DIAGNOSIS — R4189 Other symptoms and signs involving cognitive functions and awareness: Secondary | ICD-10-CM | POA: Diagnosis not present

## 2015-12-19 DIAGNOSIS — F29 Unspecified psychosis not due to a substance or known physiological condition: Secondary | ICD-10-CM | POA: Diagnosis not present

## 2015-12-22 DIAGNOSIS — R41841 Cognitive communication deficit: Secondary | ICD-10-CM | POA: Diagnosis not present

## 2015-12-22 DIAGNOSIS — R4189 Other symptoms and signs involving cognitive functions and awareness: Secondary | ICD-10-CM | POA: Diagnosis not present

## 2015-12-22 DIAGNOSIS — R531 Weakness: Secondary | ICD-10-CM | POA: Diagnosis not present

## 2015-12-22 DIAGNOSIS — F29 Unspecified psychosis not due to a substance or known physiological condition: Secondary | ICD-10-CM | POA: Diagnosis not present

## 2015-12-22 DIAGNOSIS — R278 Other lack of coordination: Secondary | ICD-10-CM | POA: Diagnosis not present

## 2015-12-22 DIAGNOSIS — M25562 Pain in left knee: Secondary | ICD-10-CM | POA: Diagnosis not present

## 2015-12-22 DIAGNOSIS — E860 Dehydration: Secondary | ICD-10-CM | POA: Diagnosis not present

## 2015-12-22 DIAGNOSIS — G309 Alzheimer's disease, unspecified: Secondary | ICD-10-CM | POA: Diagnosis not present

## 2015-12-22 DIAGNOSIS — N390 Urinary tract infection, site not specified: Secondary | ICD-10-CM | POA: Diagnosis not present

## 2015-12-23 DIAGNOSIS — E860 Dehydration: Secondary | ICD-10-CM | POA: Diagnosis not present

## 2015-12-23 DIAGNOSIS — F29 Unspecified psychosis not due to a substance or known physiological condition: Secondary | ICD-10-CM | POA: Diagnosis not present

## 2015-12-23 DIAGNOSIS — R278 Other lack of coordination: Secondary | ICD-10-CM | POA: Diagnosis not present

## 2015-12-23 DIAGNOSIS — R41841 Cognitive communication deficit: Secondary | ICD-10-CM | POA: Diagnosis not present

## 2015-12-23 DIAGNOSIS — R4189 Other symptoms and signs involving cognitive functions and awareness: Secondary | ICD-10-CM | POA: Diagnosis not present

## 2015-12-23 DIAGNOSIS — G309 Alzheimer's disease, unspecified: Secondary | ICD-10-CM | POA: Diagnosis not present

## 2015-12-24 DIAGNOSIS — G309 Alzheimer's disease, unspecified: Secondary | ICD-10-CM | POA: Diagnosis not present

## 2015-12-24 DIAGNOSIS — R41841 Cognitive communication deficit: Secondary | ICD-10-CM | POA: Diagnosis not present

## 2015-12-24 DIAGNOSIS — F29 Unspecified psychosis not due to a substance or known physiological condition: Secondary | ICD-10-CM | POA: Diagnosis not present

## 2015-12-24 DIAGNOSIS — E860 Dehydration: Secondary | ICD-10-CM | POA: Diagnosis not present

## 2015-12-24 DIAGNOSIS — R4189 Other symptoms and signs involving cognitive functions and awareness: Secondary | ICD-10-CM | POA: Diagnosis not present

## 2015-12-24 DIAGNOSIS — R278 Other lack of coordination: Secondary | ICD-10-CM | POA: Diagnosis not present

## 2015-12-25 DIAGNOSIS — R41841 Cognitive communication deficit: Secondary | ICD-10-CM | POA: Diagnosis not present

## 2015-12-25 DIAGNOSIS — R278 Other lack of coordination: Secondary | ICD-10-CM | POA: Diagnosis not present

## 2015-12-25 DIAGNOSIS — R4189 Other symptoms and signs involving cognitive functions and awareness: Secondary | ICD-10-CM | POA: Diagnosis not present

## 2015-12-25 DIAGNOSIS — F29 Unspecified psychosis not due to a substance or known physiological condition: Secondary | ICD-10-CM | POA: Diagnosis not present

## 2015-12-25 DIAGNOSIS — G309 Alzheimer's disease, unspecified: Secondary | ICD-10-CM | POA: Diagnosis not present

## 2015-12-25 DIAGNOSIS — E860 Dehydration: Secondary | ICD-10-CM | POA: Diagnosis not present

## 2015-12-25 IMAGING — CT CT FEMUR *L* W/O CM
3 of 4 series · 16 of 33 positions shown, 18 images · non-contrast
Comparison: Radiographs 11/06/2016

CLINICAL DATA: History of 2 recent falls in the past 2 weeks.
Status post left hip arthroplasty 10/19/2014

EXAM:
CT OF THE LEFT FEMUR WITHOUT CONTRAST
TECHNIQUE: Multidetector CT imaging was performed according to the standard
protocol. Multiplanar CT image reconstructions were also generated.

[Series 4: lower ext soft · axial · 0.44mm/px · z∈[-501,-94]mm · 8 of 193 slices shown, 10 images]
[im 15/193  soft-tissue]
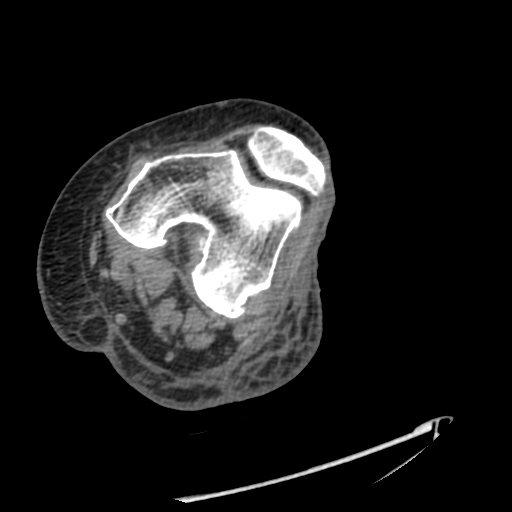
[im 15/193  bone]
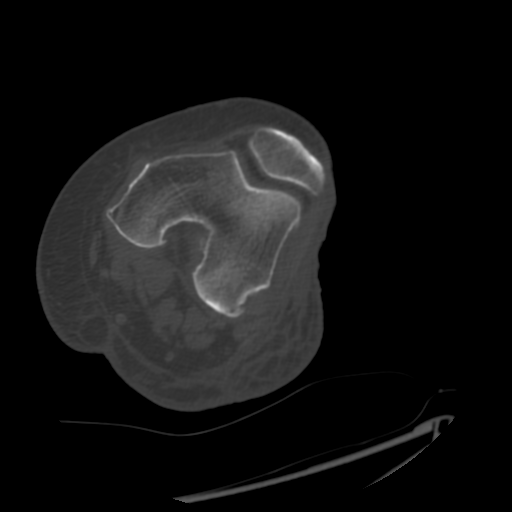
[im 45/193  bone]
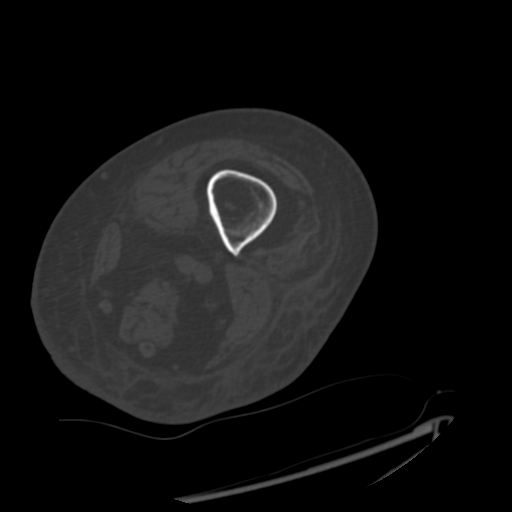
[im 60/193  bone]
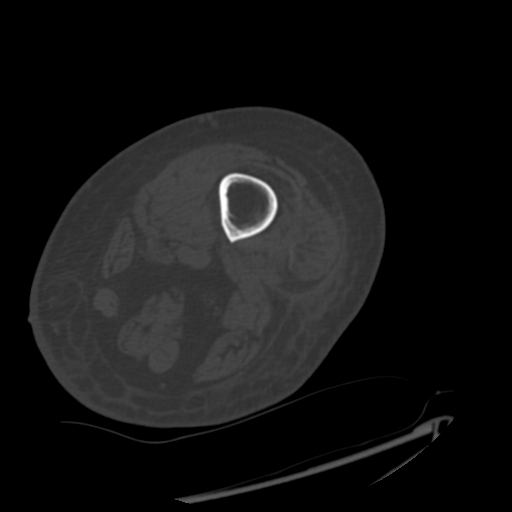
[im 89/193  bone]
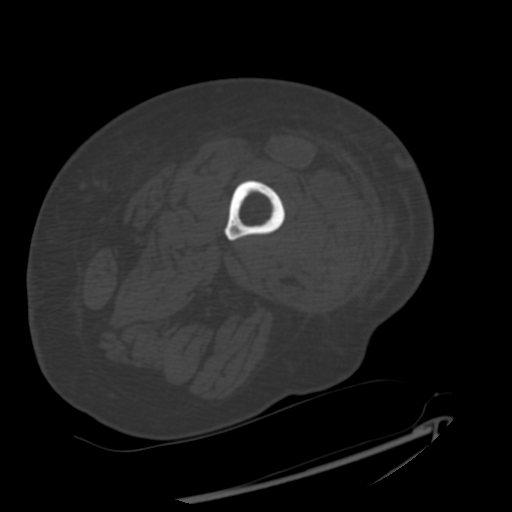
[im 104/193  soft-tissue]
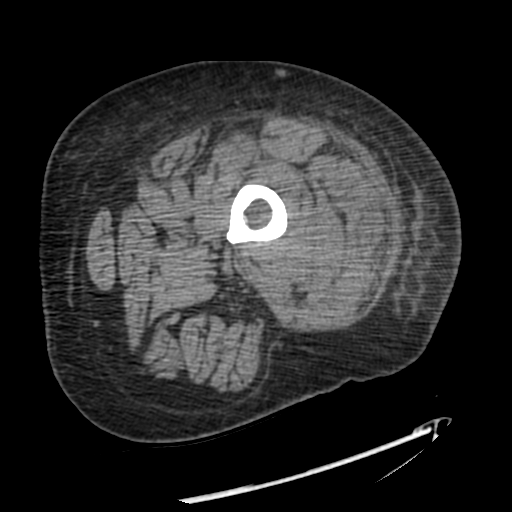
[im 104/193  bone]
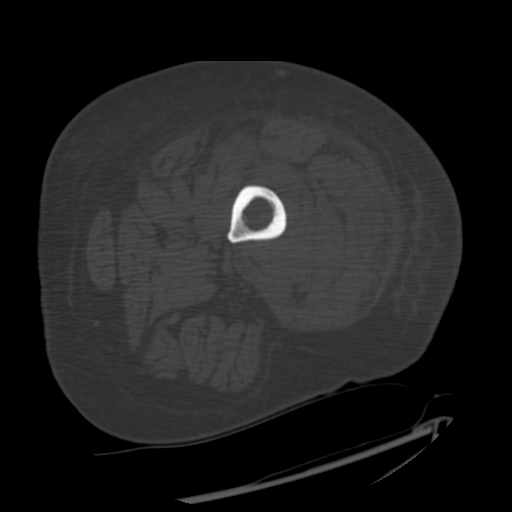
[im 133/193  bone]
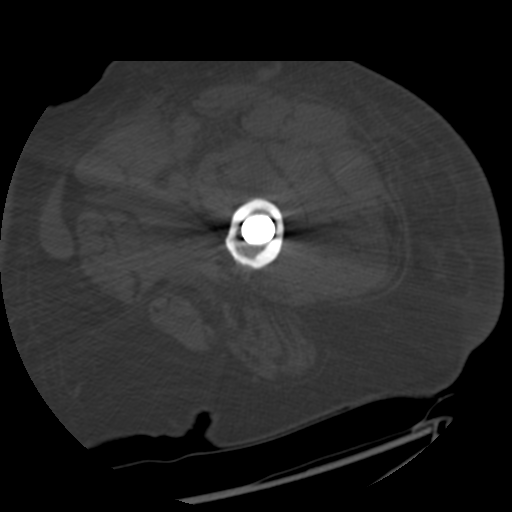
[im 148/193  bone]
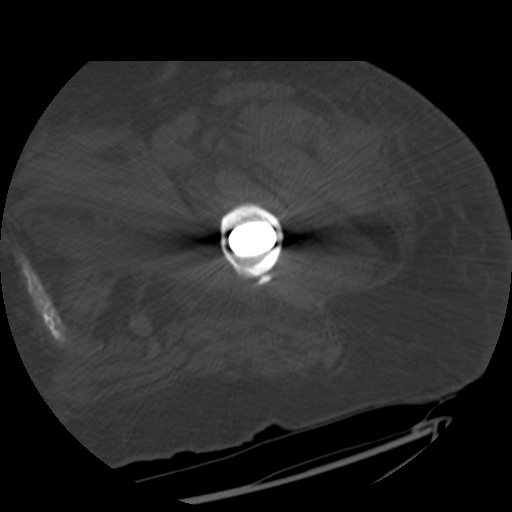
[im 178/193  bone]
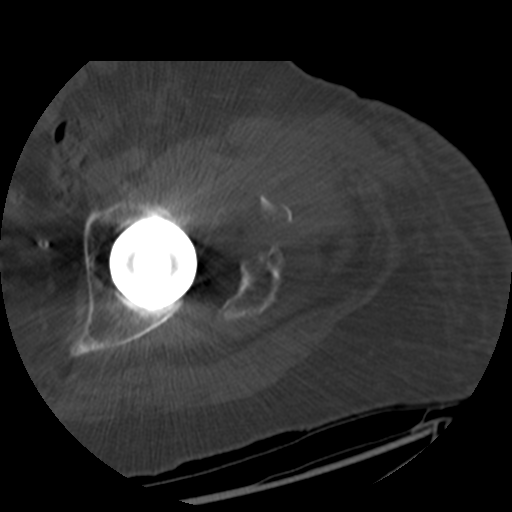

[Series 200: cor soft · coronal · 0.96mm/px · 3 of 102 slices shown]
[im 21/102  bone]
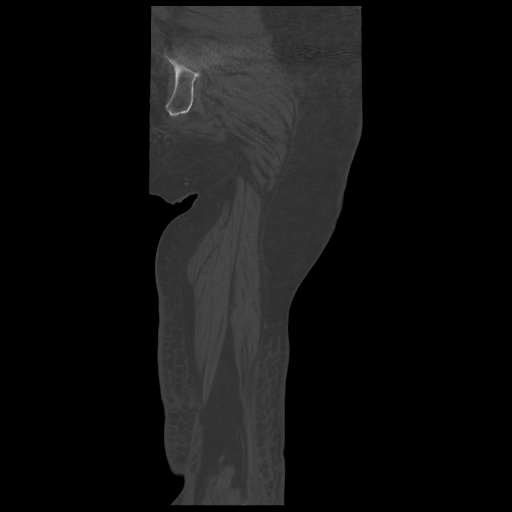
[im 41/102  bone]
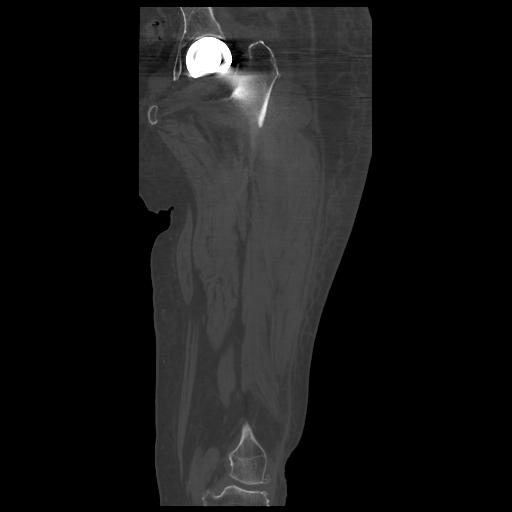
[im 61/102  bone]
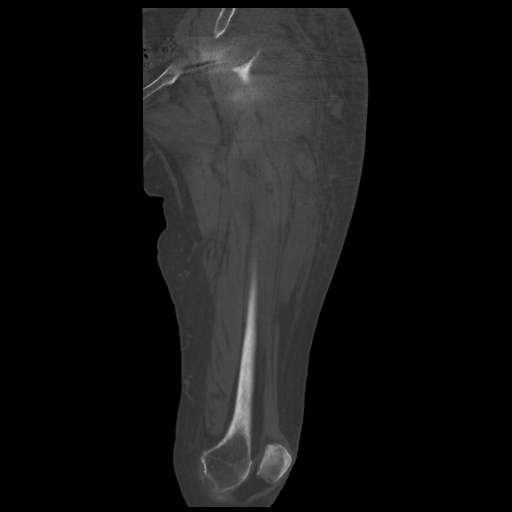

[Series 201: sag bone · sagittal · 0.96mm/px · 5 of 119 slices shown]
[im 20/119  bone]
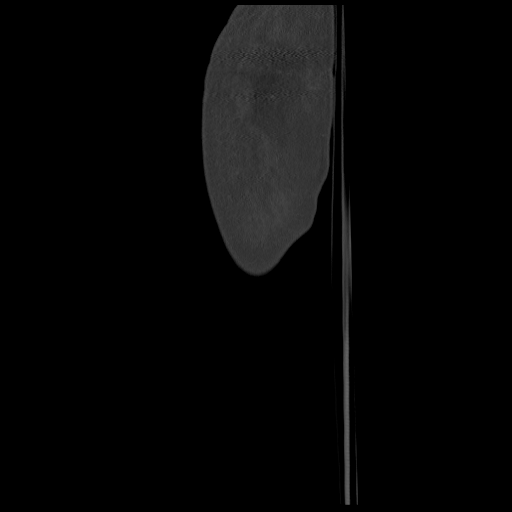
[im 40/119  bone]
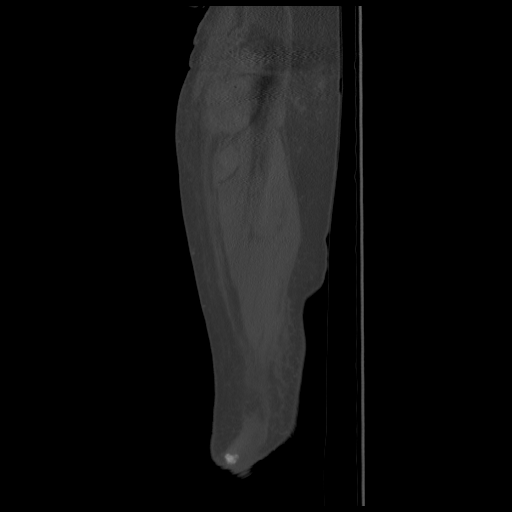
[im 60/119  bone]
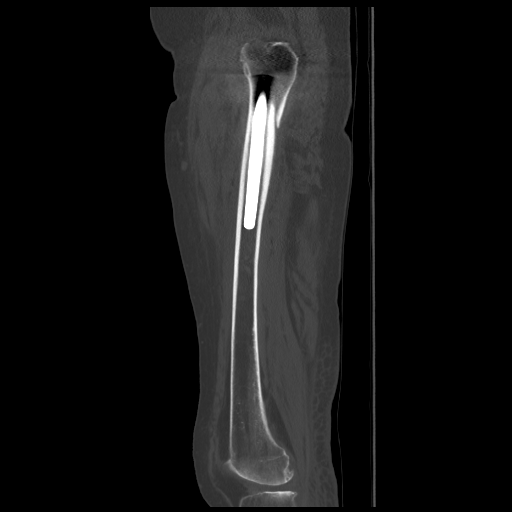
[im 79/119  bone]
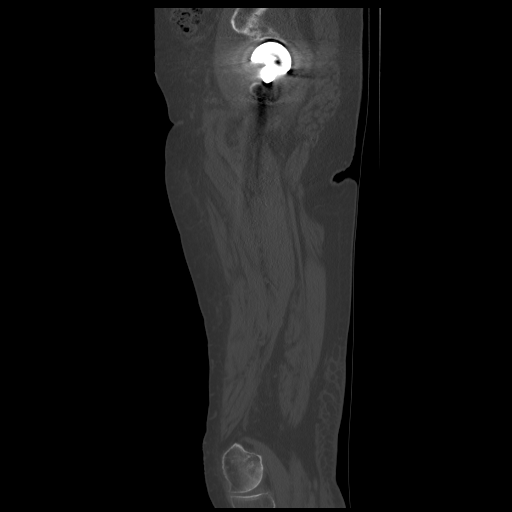
[im 99/119  bone]
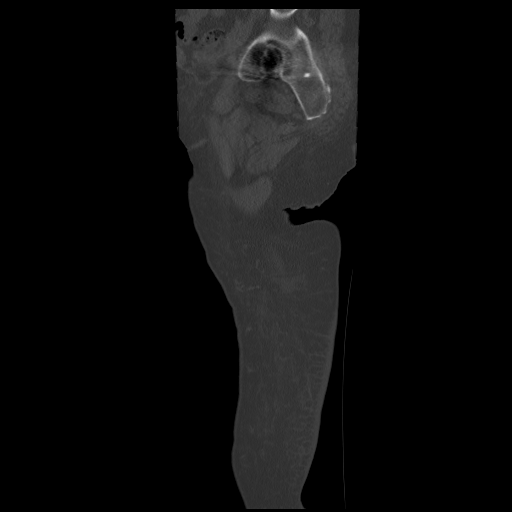

[16 of 33 positions shown; findings below may reference images not displayed]

FINDINGS: There is a longitudinal split type fracture involving the upper left
femur. The fracture extends from the anterior aspect of the greater
trochanter and continues down into the subtrochanteric region of the
upper femoral shaft posteriorly.

The prosthesis is intact.  The bipolar portion is normally located.
IMPRESSION: Mildly displaced longitudinal split type fracture involving the
greater trochanter and upper femoral shaft.

These results will be called to the ordering clinician or
representative by the Radiologist Assistant, and communication
documented in the PACS or zVision Dashboard.

## 2015-12-26 DIAGNOSIS — R41841 Cognitive communication deficit: Secondary | ICD-10-CM | POA: Diagnosis not present

## 2015-12-26 DIAGNOSIS — R278 Other lack of coordination: Secondary | ICD-10-CM | POA: Diagnosis not present

## 2015-12-26 DIAGNOSIS — G309 Alzheimer's disease, unspecified: Secondary | ICD-10-CM | POA: Diagnosis not present

## 2015-12-26 DIAGNOSIS — E860 Dehydration: Secondary | ICD-10-CM | POA: Diagnosis not present

## 2015-12-26 DIAGNOSIS — R4189 Other symptoms and signs involving cognitive functions and awareness: Secondary | ICD-10-CM | POA: Diagnosis not present

## 2015-12-26 DIAGNOSIS — F29 Unspecified psychosis not due to a substance or known physiological condition: Secondary | ICD-10-CM | POA: Diagnosis not present

## 2015-12-28 DIAGNOSIS — R278 Other lack of coordination: Secondary | ICD-10-CM | POA: Diagnosis not present

## 2015-12-28 DIAGNOSIS — R4189 Other symptoms and signs involving cognitive functions and awareness: Secondary | ICD-10-CM | POA: Diagnosis not present

## 2015-12-28 DIAGNOSIS — F29 Unspecified psychosis not due to a substance or known physiological condition: Secondary | ICD-10-CM | POA: Diagnosis not present

## 2015-12-28 DIAGNOSIS — E860 Dehydration: Secondary | ICD-10-CM | POA: Diagnosis not present

## 2015-12-28 DIAGNOSIS — G309 Alzheimer's disease, unspecified: Secondary | ICD-10-CM | POA: Diagnosis not present

## 2015-12-28 DIAGNOSIS — R41841 Cognitive communication deficit: Secondary | ICD-10-CM | POA: Diagnosis not present

## 2015-12-29 DIAGNOSIS — G309 Alzheimer's disease, unspecified: Secondary | ICD-10-CM | POA: Diagnosis not present

## 2015-12-29 DIAGNOSIS — R4189 Other symptoms and signs involving cognitive functions and awareness: Secondary | ICD-10-CM | POA: Diagnosis not present

## 2015-12-29 DIAGNOSIS — F29 Unspecified psychosis not due to a substance or known physiological condition: Secondary | ICD-10-CM | POA: Diagnosis not present

## 2015-12-29 DIAGNOSIS — R41841 Cognitive communication deficit: Secondary | ICD-10-CM | POA: Diagnosis not present

## 2015-12-29 DIAGNOSIS — E860 Dehydration: Secondary | ICD-10-CM | POA: Diagnosis not present

## 2015-12-29 DIAGNOSIS — R278 Other lack of coordination: Secondary | ICD-10-CM | POA: Diagnosis not present

## 2015-12-30 DIAGNOSIS — E860 Dehydration: Secondary | ICD-10-CM | POA: Diagnosis not present

## 2015-12-30 DIAGNOSIS — F29 Unspecified psychosis not due to a substance or known physiological condition: Secondary | ICD-10-CM | POA: Diagnosis not present

## 2015-12-30 DIAGNOSIS — G309 Alzheimer's disease, unspecified: Secondary | ICD-10-CM | POA: Diagnosis not present

## 2015-12-30 DIAGNOSIS — R278 Other lack of coordination: Secondary | ICD-10-CM | POA: Diagnosis not present

## 2015-12-30 DIAGNOSIS — R41841 Cognitive communication deficit: Secondary | ICD-10-CM | POA: Diagnosis not present

## 2015-12-30 DIAGNOSIS — R4189 Other symptoms and signs involving cognitive functions and awareness: Secondary | ICD-10-CM | POA: Diagnosis not present

## 2015-12-31 DIAGNOSIS — G309 Alzheimer's disease, unspecified: Secondary | ICD-10-CM | POA: Diagnosis not present

## 2015-12-31 DIAGNOSIS — R278 Other lack of coordination: Secondary | ICD-10-CM | POA: Diagnosis not present

## 2015-12-31 DIAGNOSIS — E860 Dehydration: Secondary | ICD-10-CM | POA: Diagnosis not present

## 2015-12-31 DIAGNOSIS — R41841 Cognitive communication deficit: Secondary | ICD-10-CM | POA: Diagnosis not present

## 2015-12-31 DIAGNOSIS — F29 Unspecified psychosis not due to a substance or known physiological condition: Secondary | ICD-10-CM | POA: Diagnosis not present

## 2015-12-31 DIAGNOSIS — R4189 Other symptoms and signs involving cognitive functions and awareness: Secondary | ICD-10-CM | POA: Diagnosis not present

## 2016-01-01 DIAGNOSIS — G309 Alzheimer's disease, unspecified: Secondary | ICD-10-CM | POA: Diagnosis not present

## 2016-01-01 DIAGNOSIS — R4189 Other symptoms and signs involving cognitive functions and awareness: Secondary | ICD-10-CM | POA: Diagnosis not present

## 2016-01-01 DIAGNOSIS — F29 Unspecified psychosis not due to a substance or known physiological condition: Secondary | ICD-10-CM | POA: Diagnosis not present

## 2016-01-01 DIAGNOSIS — R278 Other lack of coordination: Secondary | ICD-10-CM | POA: Diagnosis not present

## 2016-01-01 DIAGNOSIS — E860 Dehydration: Secondary | ICD-10-CM | POA: Diagnosis not present

## 2016-01-01 DIAGNOSIS — R41841 Cognitive communication deficit: Secondary | ICD-10-CM | POA: Diagnosis not present

## 2016-01-02 DIAGNOSIS — R4189 Other symptoms and signs involving cognitive functions and awareness: Secondary | ICD-10-CM | POA: Diagnosis not present

## 2016-01-02 DIAGNOSIS — R278 Other lack of coordination: Secondary | ICD-10-CM | POA: Diagnosis not present

## 2016-01-02 DIAGNOSIS — F29 Unspecified psychosis not due to a substance or known physiological condition: Secondary | ICD-10-CM | POA: Diagnosis not present

## 2016-01-02 DIAGNOSIS — G309 Alzheimer's disease, unspecified: Secondary | ICD-10-CM | POA: Diagnosis not present

## 2016-01-02 DIAGNOSIS — R41841 Cognitive communication deficit: Secondary | ICD-10-CM | POA: Diagnosis not present

## 2016-01-02 DIAGNOSIS — E860 Dehydration: Secondary | ICD-10-CM | POA: Diagnosis not present

## 2016-01-03 DIAGNOSIS — R4189 Other symptoms and signs involving cognitive functions and awareness: Secondary | ICD-10-CM | POA: Diagnosis not present

## 2016-01-03 DIAGNOSIS — F29 Unspecified psychosis not due to a substance or known physiological condition: Secondary | ICD-10-CM | POA: Diagnosis not present

## 2016-01-03 DIAGNOSIS — R41841 Cognitive communication deficit: Secondary | ICD-10-CM | POA: Diagnosis not present

## 2016-01-03 DIAGNOSIS — R278 Other lack of coordination: Secondary | ICD-10-CM | POA: Diagnosis not present

## 2016-01-03 DIAGNOSIS — E860 Dehydration: Secondary | ICD-10-CM | POA: Diagnosis not present

## 2016-01-03 DIAGNOSIS — G309 Alzheimer's disease, unspecified: Secondary | ICD-10-CM | POA: Diagnosis not present

## 2016-01-05 DIAGNOSIS — G309 Alzheimer's disease, unspecified: Secondary | ICD-10-CM | POA: Diagnosis not present

## 2016-01-05 DIAGNOSIS — R4189 Other symptoms and signs involving cognitive functions and awareness: Secondary | ICD-10-CM | POA: Diagnosis not present

## 2016-01-05 DIAGNOSIS — R41841 Cognitive communication deficit: Secondary | ICD-10-CM | POA: Diagnosis not present

## 2016-01-05 DIAGNOSIS — F29 Unspecified psychosis not due to a substance or known physiological condition: Secondary | ICD-10-CM | POA: Diagnosis not present

## 2016-01-05 DIAGNOSIS — E860 Dehydration: Secondary | ICD-10-CM | POA: Diagnosis not present

## 2016-01-05 DIAGNOSIS — R278 Other lack of coordination: Secondary | ICD-10-CM | POA: Diagnosis not present

## 2016-01-06 DIAGNOSIS — G309 Alzheimer's disease, unspecified: Secondary | ICD-10-CM | POA: Diagnosis not present

## 2016-01-06 DIAGNOSIS — R41841 Cognitive communication deficit: Secondary | ICD-10-CM | POA: Diagnosis not present

## 2016-01-06 DIAGNOSIS — F29 Unspecified psychosis not due to a substance or known physiological condition: Secondary | ICD-10-CM | POA: Diagnosis not present

## 2016-01-06 DIAGNOSIS — E860 Dehydration: Secondary | ICD-10-CM | POA: Diagnosis not present

## 2016-01-06 DIAGNOSIS — R278 Other lack of coordination: Secondary | ICD-10-CM | POA: Diagnosis not present

## 2016-01-06 DIAGNOSIS — R4189 Other symptoms and signs involving cognitive functions and awareness: Secondary | ICD-10-CM | POA: Diagnosis not present

## 2016-01-07 DIAGNOSIS — R278 Other lack of coordination: Secondary | ICD-10-CM | POA: Diagnosis not present

## 2016-01-07 DIAGNOSIS — R41841 Cognitive communication deficit: Secondary | ICD-10-CM | POA: Diagnosis not present

## 2016-01-07 DIAGNOSIS — R4189 Other symptoms and signs involving cognitive functions and awareness: Secondary | ICD-10-CM | POA: Diagnosis not present

## 2016-01-07 DIAGNOSIS — E860 Dehydration: Secondary | ICD-10-CM | POA: Diagnosis not present

## 2016-01-07 DIAGNOSIS — F29 Unspecified psychosis not due to a substance or known physiological condition: Secondary | ICD-10-CM | POA: Diagnosis not present

## 2016-01-07 DIAGNOSIS — G309 Alzheimer's disease, unspecified: Secondary | ICD-10-CM | POA: Diagnosis not present

## 2016-01-08 DIAGNOSIS — R41841 Cognitive communication deficit: Secondary | ICD-10-CM | POA: Diagnosis not present

## 2016-01-08 DIAGNOSIS — R278 Other lack of coordination: Secondary | ICD-10-CM | POA: Diagnosis not present

## 2016-01-08 DIAGNOSIS — G309 Alzheimer's disease, unspecified: Secondary | ICD-10-CM | POA: Diagnosis not present

## 2016-01-08 DIAGNOSIS — F29 Unspecified psychosis not due to a substance or known physiological condition: Secondary | ICD-10-CM | POA: Diagnosis not present

## 2016-01-08 DIAGNOSIS — E860 Dehydration: Secondary | ICD-10-CM | POA: Diagnosis not present

## 2016-01-08 DIAGNOSIS — R4189 Other symptoms and signs involving cognitive functions and awareness: Secondary | ICD-10-CM | POA: Diagnosis not present

## 2016-01-09 DIAGNOSIS — F29 Unspecified psychosis not due to a substance or known physiological condition: Secondary | ICD-10-CM | POA: Diagnosis not present

## 2016-01-09 DIAGNOSIS — R4189 Other symptoms and signs involving cognitive functions and awareness: Secondary | ICD-10-CM | POA: Diagnosis not present

## 2016-01-09 DIAGNOSIS — G309 Alzheimer's disease, unspecified: Secondary | ICD-10-CM | POA: Diagnosis not present

## 2016-01-09 DIAGNOSIS — E860 Dehydration: Secondary | ICD-10-CM | POA: Diagnosis not present

## 2016-01-09 DIAGNOSIS — R278 Other lack of coordination: Secondary | ICD-10-CM | POA: Diagnosis not present

## 2016-01-09 DIAGNOSIS — R41841 Cognitive communication deficit: Secondary | ICD-10-CM | POA: Diagnosis not present

## 2016-01-10 ENCOUNTER — Other Ambulatory Visit: Payer: Self-pay | Admitting: Family Medicine

## 2016-01-10 DIAGNOSIS — R278 Other lack of coordination: Secondary | ICD-10-CM | POA: Diagnosis not present

## 2016-01-10 DIAGNOSIS — E860 Dehydration: Secondary | ICD-10-CM | POA: Diagnosis not present

## 2016-01-10 DIAGNOSIS — R41841 Cognitive communication deficit: Secondary | ICD-10-CM | POA: Diagnosis not present

## 2016-01-10 DIAGNOSIS — G309 Alzheimer's disease, unspecified: Secondary | ICD-10-CM | POA: Diagnosis not present

## 2016-01-10 DIAGNOSIS — F29 Unspecified psychosis not due to a substance or known physiological condition: Secondary | ICD-10-CM | POA: Diagnosis not present

## 2016-01-10 DIAGNOSIS — R4189 Other symptoms and signs involving cognitive functions and awareness: Secondary | ICD-10-CM | POA: Diagnosis not present

## 2016-01-11 DIAGNOSIS — R4189 Other symptoms and signs involving cognitive functions and awareness: Secondary | ICD-10-CM | POA: Diagnosis not present

## 2016-01-11 DIAGNOSIS — R278 Other lack of coordination: Secondary | ICD-10-CM | POA: Diagnosis not present

## 2016-01-11 DIAGNOSIS — R41841 Cognitive communication deficit: Secondary | ICD-10-CM | POA: Diagnosis not present

## 2016-01-11 DIAGNOSIS — G309 Alzheimer's disease, unspecified: Secondary | ICD-10-CM | POA: Diagnosis not present

## 2016-01-11 DIAGNOSIS — F29 Unspecified psychosis not due to a substance or known physiological condition: Secondary | ICD-10-CM | POA: Diagnosis not present

## 2016-01-11 DIAGNOSIS — E860 Dehydration: Secondary | ICD-10-CM | POA: Diagnosis not present

## 2016-01-12 DIAGNOSIS — R4189 Other symptoms and signs involving cognitive functions and awareness: Secondary | ICD-10-CM | POA: Diagnosis not present

## 2016-01-12 DIAGNOSIS — G309 Alzheimer's disease, unspecified: Secondary | ICD-10-CM | POA: Diagnosis not present

## 2016-01-12 DIAGNOSIS — F29 Unspecified psychosis not due to a substance or known physiological condition: Secondary | ICD-10-CM | POA: Diagnosis not present

## 2016-01-12 DIAGNOSIS — E860 Dehydration: Secondary | ICD-10-CM | POA: Diagnosis not present

## 2016-01-12 DIAGNOSIS — R278 Other lack of coordination: Secondary | ICD-10-CM | POA: Diagnosis not present

## 2016-01-12 DIAGNOSIS — R41841 Cognitive communication deficit: Secondary | ICD-10-CM | POA: Diagnosis not present

## 2016-01-13 DIAGNOSIS — R4189 Other symptoms and signs involving cognitive functions and awareness: Secondary | ICD-10-CM | POA: Diagnosis not present

## 2016-01-13 DIAGNOSIS — G309 Alzheimer's disease, unspecified: Secondary | ICD-10-CM | POA: Diagnosis not present

## 2016-01-13 DIAGNOSIS — R278 Other lack of coordination: Secondary | ICD-10-CM | POA: Diagnosis not present

## 2016-01-13 DIAGNOSIS — F29 Unspecified psychosis not due to a substance or known physiological condition: Secondary | ICD-10-CM | POA: Diagnosis not present

## 2016-01-13 DIAGNOSIS — R41841 Cognitive communication deficit: Secondary | ICD-10-CM | POA: Diagnosis not present

## 2016-01-13 DIAGNOSIS — E860 Dehydration: Secondary | ICD-10-CM | POA: Diagnosis not present

## 2016-01-14 DIAGNOSIS — R4189 Other symptoms and signs involving cognitive functions and awareness: Secondary | ICD-10-CM | POA: Diagnosis not present

## 2016-01-14 DIAGNOSIS — G309 Alzheimer's disease, unspecified: Secondary | ICD-10-CM | POA: Diagnosis not present

## 2016-01-14 DIAGNOSIS — R41841 Cognitive communication deficit: Secondary | ICD-10-CM | POA: Diagnosis not present

## 2016-01-14 DIAGNOSIS — F29 Unspecified psychosis not due to a substance or known physiological condition: Secondary | ICD-10-CM | POA: Diagnosis not present

## 2016-01-14 DIAGNOSIS — E860 Dehydration: Secondary | ICD-10-CM | POA: Diagnosis not present

## 2016-01-14 DIAGNOSIS — R278 Other lack of coordination: Secondary | ICD-10-CM | POA: Diagnosis not present

## 2016-01-15 DIAGNOSIS — R278 Other lack of coordination: Secondary | ICD-10-CM | POA: Diagnosis not present

## 2016-01-15 DIAGNOSIS — G309 Alzheimer's disease, unspecified: Secondary | ICD-10-CM | POA: Diagnosis not present

## 2016-01-15 DIAGNOSIS — F29 Unspecified psychosis not due to a substance or known physiological condition: Secondary | ICD-10-CM | POA: Diagnosis not present

## 2016-01-15 DIAGNOSIS — E860 Dehydration: Secondary | ICD-10-CM | POA: Diagnosis not present

## 2016-01-15 DIAGNOSIS — R4189 Other symptoms and signs involving cognitive functions and awareness: Secondary | ICD-10-CM | POA: Diagnosis not present

## 2016-01-15 DIAGNOSIS — R41841 Cognitive communication deficit: Secondary | ICD-10-CM | POA: Diagnosis not present

## 2016-01-16 DIAGNOSIS — F29 Unspecified psychosis not due to a substance or known physiological condition: Secondary | ICD-10-CM | POA: Diagnosis not present

## 2016-01-16 DIAGNOSIS — E860 Dehydration: Secondary | ICD-10-CM | POA: Diagnosis not present

## 2016-01-16 DIAGNOSIS — R4189 Other symptoms and signs involving cognitive functions and awareness: Secondary | ICD-10-CM | POA: Diagnosis not present

## 2016-01-16 DIAGNOSIS — R41841 Cognitive communication deficit: Secondary | ICD-10-CM | POA: Diagnosis not present

## 2016-01-16 DIAGNOSIS — R278 Other lack of coordination: Secondary | ICD-10-CM | POA: Diagnosis not present

## 2016-01-16 DIAGNOSIS — G309 Alzheimer's disease, unspecified: Secondary | ICD-10-CM | POA: Diagnosis not present

## 2016-01-19 DIAGNOSIS — R278 Other lack of coordination: Secondary | ICD-10-CM | POA: Diagnosis not present

## 2016-01-20 DIAGNOSIS — R278 Other lack of coordination: Secondary | ICD-10-CM | POA: Diagnosis not present

## 2016-01-21 DIAGNOSIS — R278 Other lack of coordination: Secondary | ICD-10-CM | POA: Diagnosis not present

## 2016-01-22 DIAGNOSIS — R278 Other lack of coordination: Secondary | ICD-10-CM | POA: Diagnosis not present

## 2016-01-23 DIAGNOSIS — R278 Other lack of coordination: Secondary | ICD-10-CM | POA: Diagnosis not present

## 2016-01-25 DIAGNOSIS — R278 Other lack of coordination: Secondary | ICD-10-CM | POA: Diagnosis not present

## 2016-01-28 DIAGNOSIS — R278 Other lack of coordination: Secondary | ICD-10-CM | POA: Diagnosis not present

## 2016-01-29 DIAGNOSIS — R278 Other lack of coordination: Secondary | ICD-10-CM | POA: Diagnosis not present

## 2016-01-30 DIAGNOSIS — R278 Other lack of coordination: Secondary | ICD-10-CM | POA: Diagnosis not present

## 2016-01-31 DIAGNOSIS — R278 Other lack of coordination: Secondary | ICD-10-CM | POA: Diagnosis not present

## 2016-02-01 DIAGNOSIS — J449 Chronic obstructive pulmonary disease, unspecified: Secondary | ICD-10-CM | POA: Diagnosis not present

## 2016-02-01 DIAGNOSIS — F0280 Dementia in other diseases classified elsewhere without behavioral disturbance: Secondary | ICD-10-CM | POA: Diagnosis not present

## 2016-02-01 DIAGNOSIS — R278 Other lack of coordination: Secondary | ICD-10-CM | POA: Diagnosis not present

## 2016-02-01 DIAGNOSIS — R269 Unspecified abnormalities of gait and mobility: Secondary | ICD-10-CM | POA: Diagnosis not present

## 2016-02-02 DIAGNOSIS — R278 Other lack of coordination: Secondary | ICD-10-CM | POA: Diagnosis not present

## 2016-02-03 DIAGNOSIS — M6281 Muscle weakness (generalized): Secondary | ICD-10-CM | POA: Diagnosis not present

## 2016-02-03 DIAGNOSIS — R278 Other lack of coordination: Secondary | ICD-10-CM | POA: Diagnosis not present

## 2016-02-04 DIAGNOSIS — Z79899 Other long term (current) drug therapy: Secondary | ICD-10-CM | POA: Diagnosis not present

## 2016-02-04 DIAGNOSIS — R4182 Altered mental status, unspecified: Secondary | ICD-10-CM | POA: Diagnosis not present

## 2016-02-04 DIAGNOSIS — D649 Anemia, unspecified: Secondary | ICD-10-CM | POA: Diagnosis not present

## 2016-02-04 DIAGNOSIS — R278 Other lack of coordination: Secondary | ICD-10-CM | POA: Diagnosis not present

## 2016-02-09 DIAGNOSIS — H26493 Other secondary cataract, bilateral: Secondary | ICD-10-CM | POA: Diagnosis not present

## 2016-02-09 DIAGNOSIS — Z961 Presence of intraocular lens: Secondary | ICD-10-CM | POA: Diagnosis not present

## 2016-02-18 DIAGNOSIS — N390 Urinary tract infection, site not specified: Secondary | ICD-10-CM | POA: Diagnosis not present

## 2016-03-01 DIAGNOSIS — R269 Unspecified abnormalities of gait and mobility: Secondary | ICD-10-CM | POA: Diagnosis not present

## 2016-03-01 DIAGNOSIS — J449 Chronic obstructive pulmonary disease, unspecified: Secondary | ICD-10-CM | POA: Diagnosis not present

## 2016-03-01 DIAGNOSIS — F0280 Dementia in other diseases classified elsewhere without behavioral disturbance: Secondary | ICD-10-CM | POA: Diagnosis not present

## 2016-03-01 DIAGNOSIS — R41841 Cognitive communication deficit: Secondary | ICD-10-CM | POA: Diagnosis not present

## 2016-03-01 DIAGNOSIS — G309 Alzheimer's disease, unspecified: Secondary | ICD-10-CM | POA: Diagnosis not present

## 2016-03-01 DIAGNOSIS — R2681 Unsteadiness on feet: Secondary | ICD-10-CM | POA: Diagnosis not present

## 2016-03-02 DIAGNOSIS — R2681 Unsteadiness on feet: Secondary | ICD-10-CM | POA: Diagnosis not present

## 2016-03-02 DIAGNOSIS — G309 Alzheimer's disease, unspecified: Secondary | ICD-10-CM | POA: Diagnosis not present

## 2016-03-02 DIAGNOSIS — R41841 Cognitive communication deficit: Secondary | ICD-10-CM | POA: Diagnosis not present

## 2016-03-03 DIAGNOSIS — G309 Alzheimer's disease, unspecified: Secondary | ICD-10-CM | POA: Diagnosis not present

## 2016-03-03 DIAGNOSIS — R41841 Cognitive communication deficit: Secondary | ICD-10-CM | POA: Diagnosis not present

## 2016-03-03 DIAGNOSIS — R2681 Unsteadiness on feet: Secondary | ICD-10-CM | POA: Diagnosis not present

## 2016-03-04 DIAGNOSIS — G309 Alzheimer's disease, unspecified: Secondary | ICD-10-CM | POA: Diagnosis not present

## 2016-03-04 DIAGNOSIS — R41841 Cognitive communication deficit: Secondary | ICD-10-CM | POA: Diagnosis not present

## 2016-03-04 DIAGNOSIS — R2681 Unsteadiness on feet: Secondary | ICD-10-CM | POA: Diagnosis not present

## 2016-03-05 DIAGNOSIS — G309 Alzheimer's disease, unspecified: Secondary | ICD-10-CM | POA: Diagnosis not present

## 2016-03-05 DIAGNOSIS — R41841 Cognitive communication deficit: Secondary | ICD-10-CM | POA: Diagnosis not present

## 2016-03-05 DIAGNOSIS — R2681 Unsteadiness on feet: Secondary | ICD-10-CM | POA: Diagnosis not present

## 2016-03-08 DIAGNOSIS — R2681 Unsteadiness on feet: Secondary | ICD-10-CM | POA: Diagnosis not present

## 2016-03-08 DIAGNOSIS — G309 Alzheimer's disease, unspecified: Secondary | ICD-10-CM | POA: Diagnosis not present

## 2016-03-08 DIAGNOSIS — R41841 Cognitive communication deficit: Secondary | ICD-10-CM | POA: Diagnosis not present

## 2016-03-09 DIAGNOSIS — R41841 Cognitive communication deficit: Secondary | ICD-10-CM | POA: Diagnosis not present

## 2016-03-09 DIAGNOSIS — G309 Alzheimer's disease, unspecified: Secondary | ICD-10-CM | POA: Diagnosis not present

## 2016-03-09 DIAGNOSIS — R2681 Unsteadiness on feet: Secondary | ICD-10-CM | POA: Diagnosis not present

## 2016-03-10 DIAGNOSIS — G309 Alzheimer's disease, unspecified: Secondary | ICD-10-CM | POA: Diagnosis not present

## 2016-03-10 DIAGNOSIS — R2681 Unsteadiness on feet: Secondary | ICD-10-CM | POA: Diagnosis not present

## 2016-03-10 DIAGNOSIS — R41841 Cognitive communication deficit: Secondary | ICD-10-CM | POA: Diagnosis not present

## 2016-03-11 DIAGNOSIS — R2681 Unsteadiness on feet: Secondary | ICD-10-CM | POA: Diagnosis not present

## 2016-03-11 DIAGNOSIS — G309 Alzheimer's disease, unspecified: Secondary | ICD-10-CM | POA: Diagnosis not present

## 2016-03-11 DIAGNOSIS — R41841 Cognitive communication deficit: Secondary | ICD-10-CM | POA: Diagnosis not present

## 2016-03-12 DIAGNOSIS — R2681 Unsteadiness on feet: Secondary | ICD-10-CM | POA: Diagnosis not present

## 2016-03-12 DIAGNOSIS — R41841 Cognitive communication deficit: Secondary | ICD-10-CM | POA: Diagnosis not present

## 2016-03-12 DIAGNOSIS — G309 Alzheimer's disease, unspecified: Secondary | ICD-10-CM | POA: Diagnosis not present

## 2016-03-13 DIAGNOSIS — R41841 Cognitive communication deficit: Secondary | ICD-10-CM | POA: Diagnosis not present

## 2016-03-13 DIAGNOSIS — R2681 Unsteadiness on feet: Secondary | ICD-10-CM | POA: Diagnosis not present

## 2016-03-13 DIAGNOSIS — J189 Pneumonia, unspecified organism: Secondary | ICD-10-CM | POA: Diagnosis not present

## 2016-03-13 DIAGNOSIS — G309 Alzheimer's disease, unspecified: Secondary | ICD-10-CM | POA: Diagnosis not present

## 2016-03-15 DIAGNOSIS — R41841 Cognitive communication deficit: Secondary | ICD-10-CM | POA: Diagnosis not present

## 2016-03-15 DIAGNOSIS — R2681 Unsteadiness on feet: Secondary | ICD-10-CM | POA: Diagnosis not present

## 2016-03-15 DIAGNOSIS — G309 Alzheimer's disease, unspecified: Secondary | ICD-10-CM | POA: Diagnosis not present

## 2016-03-16 DIAGNOSIS — R41841 Cognitive communication deficit: Secondary | ICD-10-CM | POA: Diagnosis not present

## 2016-03-16 DIAGNOSIS — G309 Alzheimer's disease, unspecified: Secondary | ICD-10-CM | POA: Diagnosis not present

## 2016-03-16 DIAGNOSIS — R2681 Unsteadiness on feet: Secondary | ICD-10-CM | POA: Diagnosis not present

## 2016-03-17 DIAGNOSIS — R41841 Cognitive communication deficit: Secondary | ICD-10-CM | POA: Diagnosis not present

## 2016-03-17 DIAGNOSIS — G309 Alzheimer's disease, unspecified: Secondary | ICD-10-CM | POA: Diagnosis not present

## 2016-03-17 DIAGNOSIS — R2681 Unsteadiness on feet: Secondary | ICD-10-CM | POA: Diagnosis not present

## 2016-03-18 DIAGNOSIS — R41841 Cognitive communication deficit: Secondary | ICD-10-CM | POA: Diagnosis not present

## 2016-03-18 DIAGNOSIS — R2681 Unsteadiness on feet: Secondary | ICD-10-CM | POA: Diagnosis not present

## 2016-03-18 DIAGNOSIS — G309 Alzheimer's disease, unspecified: Secondary | ICD-10-CM | POA: Diagnosis not present

## 2016-03-19 DIAGNOSIS — R41841 Cognitive communication deficit: Secondary | ICD-10-CM | POA: Diagnosis not present

## 2016-03-19 DIAGNOSIS — R2681 Unsteadiness on feet: Secondary | ICD-10-CM | POA: Diagnosis not present

## 2016-03-19 DIAGNOSIS — G309 Alzheimer's disease, unspecified: Secondary | ICD-10-CM | POA: Diagnosis not present

## 2016-03-20 DIAGNOSIS — R2681 Unsteadiness on feet: Secondary | ICD-10-CM | POA: Diagnosis not present

## 2016-03-20 DIAGNOSIS — G309 Alzheimer's disease, unspecified: Secondary | ICD-10-CM | POA: Diagnosis not present

## 2016-03-20 DIAGNOSIS — R41841 Cognitive communication deficit: Secondary | ICD-10-CM | POA: Diagnosis not present

## 2016-03-21 DIAGNOSIS — R2681 Unsteadiness on feet: Secondary | ICD-10-CM | POA: Diagnosis not present

## 2016-03-21 DIAGNOSIS — R41841 Cognitive communication deficit: Secondary | ICD-10-CM | POA: Diagnosis not present

## 2016-03-21 DIAGNOSIS — G309 Alzheimer's disease, unspecified: Secondary | ICD-10-CM | POA: Diagnosis not present

## 2016-03-22 DIAGNOSIS — G309 Alzheimer's disease, unspecified: Secondary | ICD-10-CM | POA: Diagnosis not present

## 2016-03-22 DIAGNOSIS — R2681 Unsteadiness on feet: Secondary | ICD-10-CM | POA: Diagnosis not present

## 2016-03-22 DIAGNOSIS — R41841 Cognitive communication deficit: Secondary | ICD-10-CM | POA: Diagnosis not present

## 2016-03-23 DIAGNOSIS — G309 Alzheimer's disease, unspecified: Secondary | ICD-10-CM | POA: Diagnosis not present

## 2016-03-23 DIAGNOSIS — M129 Arthropathy, unspecified: Secondary | ICD-10-CM | POA: Diagnosis not present

## 2016-03-23 DIAGNOSIS — N189 Chronic kidney disease, unspecified: Secondary | ICD-10-CM | POA: Diagnosis not present

## 2016-03-23 DIAGNOSIS — R41841 Cognitive communication deficit: Secondary | ICD-10-CM | POA: Diagnosis not present

## 2016-03-23 DIAGNOSIS — R2681 Unsteadiness on feet: Secondary | ICD-10-CM | POA: Diagnosis not present

## 2016-03-23 DIAGNOSIS — J449 Chronic obstructive pulmonary disease, unspecified: Secondary | ICD-10-CM | POA: Diagnosis not present

## 2016-03-24 DIAGNOSIS — R2681 Unsteadiness on feet: Secondary | ICD-10-CM | POA: Diagnosis not present

## 2016-03-24 DIAGNOSIS — G309 Alzheimer's disease, unspecified: Secondary | ICD-10-CM | POA: Diagnosis not present

## 2016-03-24 DIAGNOSIS — R41841 Cognitive communication deficit: Secondary | ICD-10-CM | POA: Diagnosis not present

## 2016-03-25 DIAGNOSIS — G309 Alzheimer's disease, unspecified: Secondary | ICD-10-CM | POA: Diagnosis not present

## 2016-03-25 DIAGNOSIS — R41841 Cognitive communication deficit: Secondary | ICD-10-CM | POA: Diagnosis not present

## 2016-03-25 DIAGNOSIS — R2681 Unsteadiness on feet: Secondary | ICD-10-CM | POA: Diagnosis not present

## 2016-03-26 DIAGNOSIS — R41841 Cognitive communication deficit: Secondary | ICD-10-CM | POA: Diagnosis not present

## 2016-03-26 DIAGNOSIS — G309 Alzheimer's disease, unspecified: Secondary | ICD-10-CM | POA: Diagnosis not present

## 2016-03-26 DIAGNOSIS — R2681 Unsteadiness on feet: Secondary | ICD-10-CM | POA: Diagnosis not present

## 2016-03-27 DIAGNOSIS — R2681 Unsteadiness on feet: Secondary | ICD-10-CM | POA: Diagnosis not present

## 2016-03-27 DIAGNOSIS — G309 Alzheimer's disease, unspecified: Secondary | ICD-10-CM | POA: Diagnosis not present

## 2016-03-27 DIAGNOSIS — R41841 Cognitive communication deficit: Secondary | ICD-10-CM | POA: Diagnosis not present

## 2016-03-28 DIAGNOSIS — R2681 Unsteadiness on feet: Secondary | ICD-10-CM | POA: Diagnosis not present

## 2016-03-28 DIAGNOSIS — G309 Alzheimer's disease, unspecified: Secondary | ICD-10-CM | POA: Diagnosis not present

## 2016-03-28 DIAGNOSIS — R41841 Cognitive communication deficit: Secondary | ICD-10-CM | POA: Diagnosis not present

## 2016-03-29 DIAGNOSIS — R2681 Unsteadiness on feet: Secondary | ICD-10-CM | POA: Diagnosis not present

## 2016-03-29 DIAGNOSIS — G309 Alzheimer's disease, unspecified: Secondary | ICD-10-CM | POA: Diagnosis not present

## 2016-03-29 DIAGNOSIS — R41841 Cognitive communication deficit: Secondary | ICD-10-CM | POA: Diagnosis not present

## 2016-03-30 DIAGNOSIS — R2681 Unsteadiness on feet: Secondary | ICD-10-CM | POA: Diagnosis not present

## 2016-03-30 DIAGNOSIS — G309 Alzheimer's disease, unspecified: Secondary | ICD-10-CM | POA: Diagnosis not present

## 2016-03-30 DIAGNOSIS — R41841 Cognitive communication deficit: Secondary | ICD-10-CM | POA: Diagnosis not present

## 2016-03-31 DIAGNOSIS — R41841 Cognitive communication deficit: Secondary | ICD-10-CM | POA: Diagnosis not present

## 2016-03-31 DIAGNOSIS — G309 Alzheimer's disease, unspecified: Secondary | ICD-10-CM | POA: Diagnosis not present

## 2016-03-31 DIAGNOSIS — R2681 Unsteadiness on feet: Secondary | ICD-10-CM | POA: Diagnosis not present

## 2016-04-01 DIAGNOSIS — G309 Alzheimer's disease, unspecified: Secondary | ICD-10-CM | POA: Diagnosis not present

## 2016-04-01 DIAGNOSIS — R2681 Unsteadiness on feet: Secondary | ICD-10-CM | POA: Diagnosis not present

## 2016-04-01 DIAGNOSIS — R41841 Cognitive communication deficit: Secondary | ICD-10-CM | POA: Diagnosis not present

## 2016-04-02 DIAGNOSIS — R2681 Unsteadiness on feet: Secondary | ICD-10-CM | POA: Diagnosis not present

## 2016-04-02 DIAGNOSIS — R41841 Cognitive communication deficit: Secondary | ICD-10-CM | POA: Diagnosis not present

## 2016-04-02 DIAGNOSIS — G309 Alzheimer's disease, unspecified: Secondary | ICD-10-CM | POA: Diagnosis not present

## 2016-04-03 DIAGNOSIS — R2681 Unsteadiness on feet: Secondary | ICD-10-CM | POA: Diagnosis not present

## 2016-04-03 DIAGNOSIS — G309 Alzheimer's disease, unspecified: Secondary | ICD-10-CM | POA: Diagnosis not present

## 2016-04-03 DIAGNOSIS — R41841 Cognitive communication deficit: Secondary | ICD-10-CM | POA: Diagnosis not present

## 2016-04-05 DIAGNOSIS — R41841 Cognitive communication deficit: Secondary | ICD-10-CM | POA: Diagnosis not present

## 2016-04-05 DIAGNOSIS — R2681 Unsteadiness on feet: Secondary | ICD-10-CM | POA: Diagnosis not present

## 2016-04-05 DIAGNOSIS — G309 Alzheimer's disease, unspecified: Secondary | ICD-10-CM | POA: Diagnosis not present

## 2016-04-06 DIAGNOSIS — G309 Alzheimer's disease, unspecified: Secondary | ICD-10-CM | POA: Diagnosis not present

## 2016-04-06 DIAGNOSIS — R2681 Unsteadiness on feet: Secondary | ICD-10-CM | POA: Diagnosis not present

## 2016-04-06 DIAGNOSIS — R41841 Cognitive communication deficit: Secondary | ICD-10-CM | POA: Diagnosis not present

## 2016-04-07 DIAGNOSIS — R2681 Unsteadiness on feet: Secondary | ICD-10-CM | POA: Diagnosis not present

## 2016-04-07 DIAGNOSIS — R41841 Cognitive communication deficit: Secondary | ICD-10-CM | POA: Diagnosis not present

## 2016-04-07 DIAGNOSIS — G309 Alzheimer's disease, unspecified: Secondary | ICD-10-CM | POA: Diagnosis not present

## 2016-04-08 DIAGNOSIS — R2681 Unsteadiness on feet: Secondary | ICD-10-CM | POA: Diagnosis not present

## 2016-04-08 DIAGNOSIS — R41841 Cognitive communication deficit: Secondary | ICD-10-CM | POA: Diagnosis not present

## 2016-04-08 DIAGNOSIS — G309 Alzheimer's disease, unspecified: Secondary | ICD-10-CM | POA: Diagnosis not present

## 2016-04-09 DIAGNOSIS — G309 Alzheimer's disease, unspecified: Secondary | ICD-10-CM | POA: Diagnosis not present

## 2016-04-09 DIAGNOSIS — R41841 Cognitive communication deficit: Secondary | ICD-10-CM | POA: Diagnosis not present

## 2016-04-09 DIAGNOSIS — R2681 Unsteadiness on feet: Secondary | ICD-10-CM | POA: Diagnosis not present

## 2016-04-10 DIAGNOSIS — R2681 Unsteadiness on feet: Secondary | ICD-10-CM | POA: Diagnosis not present

## 2016-04-10 DIAGNOSIS — R41841 Cognitive communication deficit: Secondary | ICD-10-CM | POA: Diagnosis not present

## 2016-04-10 DIAGNOSIS — G309 Alzheimer's disease, unspecified: Secondary | ICD-10-CM | POA: Diagnosis not present

## 2016-04-11 DIAGNOSIS — G309 Alzheimer's disease, unspecified: Secondary | ICD-10-CM | POA: Diagnosis not present

## 2016-04-11 DIAGNOSIS — R2681 Unsteadiness on feet: Secondary | ICD-10-CM | POA: Diagnosis not present

## 2016-04-11 DIAGNOSIS — R41841 Cognitive communication deficit: Secondary | ICD-10-CM | POA: Diagnosis not present

## 2016-04-12 DIAGNOSIS — R41841 Cognitive communication deficit: Secondary | ICD-10-CM | POA: Diagnosis not present

## 2016-04-12 DIAGNOSIS — R2681 Unsteadiness on feet: Secondary | ICD-10-CM | POA: Diagnosis not present

## 2016-04-12 DIAGNOSIS — G309 Alzheimer's disease, unspecified: Secondary | ICD-10-CM | POA: Diagnosis not present

## 2016-04-13 DIAGNOSIS — G309 Alzheimer's disease, unspecified: Secondary | ICD-10-CM | POA: Diagnosis not present

## 2016-04-13 DIAGNOSIS — R2681 Unsteadiness on feet: Secondary | ICD-10-CM | POA: Diagnosis not present

## 2016-04-13 DIAGNOSIS — R41841 Cognitive communication deficit: Secondary | ICD-10-CM | POA: Diagnosis not present

## 2016-04-14 DIAGNOSIS — R41841 Cognitive communication deficit: Secondary | ICD-10-CM | POA: Diagnosis not present

## 2016-04-14 DIAGNOSIS — G309 Alzheimer's disease, unspecified: Secondary | ICD-10-CM | POA: Diagnosis not present

## 2016-04-14 DIAGNOSIS — R2681 Unsteadiness on feet: Secondary | ICD-10-CM | POA: Diagnosis not present

## 2016-04-15 DIAGNOSIS — R41841 Cognitive communication deficit: Secondary | ICD-10-CM | POA: Diagnosis not present

## 2016-04-15 DIAGNOSIS — R2681 Unsteadiness on feet: Secondary | ICD-10-CM | POA: Diagnosis not present

## 2016-04-15 DIAGNOSIS — G309 Alzheimer's disease, unspecified: Secondary | ICD-10-CM | POA: Diagnosis not present

## 2016-04-16 DIAGNOSIS — G309 Alzheimer's disease, unspecified: Secondary | ICD-10-CM | POA: Diagnosis not present

## 2016-04-16 DIAGNOSIS — R2681 Unsteadiness on feet: Secondary | ICD-10-CM | POA: Diagnosis not present

## 2016-04-16 DIAGNOSIS — R41841 Cognitive communication deficit: Secondary | ICD-10-CM | POA: Diagnosis not present

## 2016-04-17 DIAGNOSIS — R41841 Cognitive communication deficit: Secondary | ICD-10-CM | POA: Diagnosis not present

## 2016-04-17 DIAGNOSIS — G309 Alzheimer's disease, unspecified: Secondary | ICD-10-CM | POA: Diagnosis not present

## 2016-04-17 DIAGNOSIS — R2681 Unsteadiness on feet: Secondary | ICD-10-CM | POA: Diagnosis not present

## 2016-04-19 DIAGNOSIS — R2681 Unsteadiness on feet: Secondary | ICD-10-CM | POA: Diagnosis not present

## 2016-04-19 DIAGNOSIS — R41841 Cognitive communication deficit: Secondary | ICD-10-CM | POA: Diagnosis not present

## 2016-04-19 DIAGNOSIS — G309 Alzheimer's disease, unspecified: Secondary | ICD-10-CM | POA: Diagnosis not present

## 2016-04-20 DIAGNOSIS — G309 Alzheimer's disease, unspecified: Secondary | ICD-10-CM | POA: Diagnosis not present

## 2016-04-20 DIAGNOSIS — R2681 Unsteadiness on feet: Secondary | ICD-10-CM | POA: Diagnosis not present

## 2016-04-20 DIAGNOSIS — R41841 Cognitive communication deficit: Secondary | ICD-10-CM | POA: Diagnosis not present

## 2016-04-21 DIAGNOSIS — R2681 Unsteadiness on feet: Secondary | ICD-10-CM | POA: Diagnosis not present

## 2016-04-21 DIAGNOSIS — N181 Chronic kidney disease, stage 1: Secondary | ICD-10-CM | POA: Diagnosis not present

## 2016-04-21 DIAGNOSIS — M545 Low back pain: Secondary | ICD-10-CM | POA: Diagnosis not present

## 2016-04-21 DIAGNOSIS — R41841 Cognitive communication deficit: Secondary | ICD-10-CM | POA: Diagnosis not present

## 2016-04-21 DIAGNOSIS — R269 Unspecified abnormalities of gait and mobility: Secondary | ICD-10-CM | POA: Diagnosis not present

## 2016-04-21 DIAGNOSIS — J440 Chronic obstructive pulmonary disease with acute lower respiratory infection: Secondary | ICD-10-CM | POA: Diagnosis not present

## 2016-04-21 DIAGNOSIS — G309 Alzheimer's disease, unspecified: Secondary | ICD-10-CM | POA: Diagnosis not present

## 2016-04-22 DIAGNOSIS — R41841 Cognitive communication deficit: Secondary | ICD-10-CM | POA: Diagnosis not present

## 2016-04-22 DIAGNOSIS — G309 Alzheimer's disease, unspecified: Secondary | ICD-10-CM | POA: Diagnosis not present

## 2016-04-22 DIAGNOSIS — R2681 Unsteadiness on feet: Secondary | ICD-10-CM | POA: Diagnosis not present

## 2016-04-23 DIAGNOSIS — R2681 Unsteadiness on feet: Secondary | ICD-10-CM | POA: Diagnosis not present

## 2016-04-23 DIAGNOSIS — R41841 Cognitive communication deficit: Secondary | ICD-10-CM | POA: Diagnosis not present

## 2016-04-23 DIAGNOSIS — G309 Alzheimer's disease, unspecified: Secondary | ICD-10-CM | POA: Diagnosis not present

## 2016-04-26 DIAGNOSIS — R2681 Unsteadiness on feet: Secondary | ICD-10-CM | POA: Diagnosis not present

## 2016-04-26 DIAGNOSIS — G309 Alzheimer's disease, unspecified: Secondary | ICD-10-CM | POA: Diagnosis not present

## 2016-04-26 DIAGNOSIS — R41841 Cognitive communication deficit: Secondary | ICD-10-CM | POA: Diagnosis not present

## 2016-04-27 DIAGNOSIS — R41841 Cognitive communication deficit: Secondary | ICD-10-CM | POA: Diagnosis not present

## 2016-04-27 DIAGNOSIS — G309 Alzheimer's disease, unspecified: Secondary | ICD-10-CM | POA: Diagnosis not present

## 2016-04-27 DIAGNOSIS — R2681 Unsteadiness on feet: Secondary | ICD-10-CM | POA: Diagnosis not present

## 2016-04-28 DIAGNOSIS — G309 Alzheimer's disease, unspecified: Secondary | ICD-10-CM | POA: Diagnosis not present

## 2016-04-28 DIAGNOSIS — R41841 Cognitive communication deficit: Secondary | ICD-10-CM | POA: Diagnosis not present

## 2016-04-28 DIAGNOSIS — R2681 Unsteadiness on feet: Secondary | ICD-10-CM | POA: Diagnosis not present

## 2016-04-29 DIAGNOSIS — R41841 Cognitive communication deficit: Secondary | ICD-10-CM | POA: Diagnosis not present

## 2016-04-29 DIAGNOSIS — R2681 Unsteadiness on feet: Secondary | ICD-10-CM | POA: Diagnosis not present

## 2016-04-29 DIAGNOSIS — G309 Alzheimer's disease, unspecified: Secondary | ICD-10-CM | POA: Diagnosis not present

## 2016-04-30 DIAGNOSIS — R41841 Cognitive communication deficit: Secondary | ICD-10-CM | POA: Diagnosis not present

## 2016-04-30 DIAGNOSIS — R2681 Unsteadiness on feet: Secondary | ICD-10-CM | POA: Diagnosis not present

## 2016-04-30 DIAGNOSIS — G309 Alzheimer's disease, unspecified: Secondary | ICD-10-CM | POA: Diagnosis not present

## 2016-05-03 DIAGNOSIS — R2681 Unsteadiness on feet: Secondary | ICD-10-CM | POA: Diagnosis not present

## 2016-05-03 DIAGNOSIS — R41841 Cognitive communication deficit: Secondary | ICD-10-CM | POA: Diagnosis not present

## 2016-05-03 DIAGNOSIS — G309 Alzheimer's disease, unspecified: Secondary | ICD-10-CM | POA: Diagnosis not present

## 2016-05-04 DIAGNOSIS — R2681 Unsteadiness on feet: Secondary | ICD-10-CM | POA: Diagnosis not present

## 2016-05-04 DIAGNOSIS — G309 Alzheimer's disease, unspecified: Secondary | ICD-10-CM | POA: Diagnosis not present

## 2016-05-04 DIAGNOSIS — R41841 Cognitive communication deficit: Secondary | ICD-10-CM | POA: Diagnosis not present

## 2016-05-05 DIAGNOSIS — R2681 Unsteadiness on feet: Secondary | ICD-10-CM | POA: Diagnosis not present

## 2016-05-05 DIAGNOSIS — R41841 Cognitive communication deficit: Secondary | ICD-10-CM | POA: Diagnosis not present

## 2016-05-05 DIAGNOSIS — G309 Alzheimer's disease, unspecified: Secondary | ICD-10-CM | POA: Diagnosis not present

## 2016-05-06 DIAGNOSIS — R2681 Unsteadiness on feet: Secondary | ICD-10-CM | POA: Diagnosis not present

## 2016-05-06 DIAGNOSIS — R41841 Cognitive communication deficit: Secondary | ICD-10-CM | POA: Diagnosis not present

## 2016-05-06 DIAGNOSIS — G309 Alzheimer's disease, unspecified: Secondary | ICD-10-CM | POA: Diagnosis not present

## 2016-05-06 DIAGNOSIS — Z23 Encounter for immunization: Secondary | ICD-10-CM | POA: Diagnosis not present

## 2016-05-07 DIAGNOSIS — G309 Alzheimer's disease, unspecified: Secondary | ICD-10-CM | POA: Diagnosis not present

## 2016-05-07 DIAGNOSIS — R2681 Unsteadiness on feet: Secondary | ICD-10-CM | POA: Diagnosis not present

## 2016-05-07 DIAGNOSIS — R41841 Cognitive communication deficit: Secondary | ICD-10-CM | POA: Diagnosis not present

## 2016-05-08 DIAGNOSIS — G309 Alzheimer's disease, unspecified: Secondary | ICD-10-CM | POA: Diagnosis not present

## 2016-05-08 DIAGNOSIS — R2681 Unsteadiness on feet: Secondary | ICD-10-CM | POA: Diagnosis not present

## 2016-05-08 DIAGNOSIS — R41841 Cognitive communication deficit: Secondary | ICD-10-CM | POA: Diagnosis not present

## 2016-05-10 DIAGNOSIS — R41841 Cognitive communication deficit: Secondary | ICD-10-CM | POA: Diagnosis not present

## 2016-05-10 DIAGNOSIS — R2681 Unsteadiness on feet: Secondary | ICD-10-CM | POA: Diagnosis not present

## 2016-05-10 DIAGNOSIS — G309 Alzheimer's disease, unspecified: Secondary | ICD-10-CM | POA: Diagnosis not present

## 2016-05-11 DIAGNOSIS — R2681 Unsteadiness on feet: Secondary | ICD-10-CM | POA: Diagnosis not present

## 2016-05-11 DIAGNOSIS — R41841 Cognitive communication deficit: Secondary | ICD-10-CM | POA: Diagnosis not present

## 2016-05-11 DIAGNOSIS — G309 Alzheimer's disease, unspecified: Secondary | ICD-10-CM | POA: Diagnosis not present

## 2016-05-12 DIAGNOSIS — R2681 Unsteadiness on feet: Secondary | ICD-10-CM | POA: Diagnosis not present

## 2016-05-12 DIAGNOSIS — R41841 Cognitive communication deficit: Secondary | ICD-10-CM | POA: Diagnosis not present

## 2016-05-12 DIAGNOSIS — G309 Alzheimer's disease, unspecified: Secondary | ICD-10-CM | POA: Diagnosis not present

## 2016-05-13 DIAGNOSIS — R2681 Unsteadiness on feet: Secondary | ICD-10-CM | POA: Diagnosis not present

## 2016-05-13 DIAGNOSIS — G309 Alzheimer's disease, unspecified: Secondary | ICD-10-CM | POA: Diagnosis not present

## 2016-05-13 DIAGNOSIS — R41841 Cognitive communication deficit: Secondary | ICD-10-CM | POA: Diagnosis not present

## 2016-05-17 DIAGNOSIS — R062 Wheezing: Secondary | ICD-10-CM | POA: Diagnosis not present

## 2016-05-17 DIAGNOSIS — R0902 Hypoxemia: Secondary | ICD-10-CM | POA: Diagnosis not present

## 2016-05-20 DIAGNOSIS — N390 Urinary tract infection, site not specified: Secondary | ICD-10-CM | POA: Diagnosis not present

## 2016-05-26 DIAGNOSIS — N181 Chronic kidney disease, stage 1: Secondary | ICD-10-CM | POA: Diagnosis not present

## 2016-05-26 DIAGNOSIS — R269 Unspecified abnormalities of gait and mobility: Secondary | ICD-10-CM | POA: Diagnosis not present

## 2016-05-26 DIAGNOSIS — J441 Chronic obstructive pulmonary disease with (acute) exacerbation: Secondary | ICD-10-CM | POA: Diagnosis not present

## 2016-05-31 DIAGNOSIS — F5101 Primary insomnia: Secondary | ICD-10-CM | POA: Diagnosis not present

## 2016-05-31 DIAGNOSIS — F0280 Dementia in other diseases classified elsewhere without behavioral disturbance: Secondary | ICD-10-CM | POA: Diagnosis not present

## 2016-05-31 DIAGNOSIS — F419 Anxiety disorder, unspecified: Secondary | ICD-10-CM | POA: Diagnosis not present

## 2016-05-31 DIAGNOSIS — F330 Major depressive disorder, recurrent, mild: Secondary | ICD-10-CM | POA: Diagnosis not present

## 2016-05-31 DIAGNOSIS — G301 Alzheimer's disease with late onset: Secondary | ICD-10-CM | POA: Diagnosis not present

## 2016-06-21 DIAGNOSIS — F0280 Dementia in other diseases classified elsewhere without behavioral disturbance: Secondary | ICD-10-CM | POA: Diagnosis not present

## 2016-06-21 DIAGNOSIS — F419 Anxiety disorder, unspecified: Secondary | ICD-10-CM | POA: Diagnosis not present

## 2016-06-21 DIAGNOSIS — F330 Major depressive disorder, recurrent, mild: Secondary | ICD-10-CM | POA: Diagnosis not present

## 2016-06-22 DIAGNOSIS — R278 Other lack of coordination: Secondary | ICD-10-CM | POA: Diagnosis not present

## 2016-06-23 DIAGNOSIS — R278 Other lack of coordination: Secondary | ICD-10-CM | POA: Diagnosis not present

## 2016-06-24 DIAGNOSIS — R278 Other lack of coordination: Secondary | ICD-10-CM | POA: Diagnosis not present

## 2016-06-25 DIAGNOSIS — R278 Other lack of coordination: Secondary | ICD-10-CM | POA: Diagnosis not present

## 2016-06-28 DIAGNOSIS — R278 Other lack of coordination: Secondary | ICD-10-CM | POA: Diagnosis not present

## 2016-06-29 DIAGNOSIS — R278 Other lack of coordination: Secondary | ICD-10-CM | POA: Diagnosis not present

## 2016-06-30 DIAGNOSIS — R269 Unspecified abnormalities of gait and mobility: Secondary | ICD-10-CM | POA: Diagnosis not present

## 2016-06-30 DIAGNOSIS — N181 Chronic kidney disease, stage 1: Secondary | ICD-10-CM | POA: Diagnosis not present

## 2016-06-30 DIAGNOSIS — J441 Chronic obstructive pulmonary disease with (acute) exacerbation: Secondary | ICD-10-CM | POA: Diagnosis not present

## 2016-07-01 DIAGNOSIS — R278 Other lack of coordination: Secondary | ICD-10-CM | POA: Diagnosis not present

## 2016-07-02 DIAGNOSIS — R278 Other lack of coordination: Secondary | ICD-10-CM | POA: Diagnosis not present

## 2016-07-05 DIAGNOSIS — R278 Other lack of coordination: Secondary | ICD-10-CM | POA: Diagnosis not present

## 2016-07-06 DIAGNOSIS — R278 Other lack of coordination: Secondary | ICD-10-CM | POA: Diagnosis not present

## 2016-07-07 DIAGNOSIS — R278 Other lack of coordination: Secondary | ICD-10-CM | POA: Diagnosis not present

## 2016-07-08 DIAGNOSIS — R278 Other lack of coordination: Secondary | ICD-10-CM | POA: Diagnosis not present

## 2016-07-09 DIAGNOSIS — R278 Other lack of coordination: Secondary | ICD-10-CM | POA: Diagnosis not present

## 2016-07-13 DIAGNOSIS — R278 Other lack of coordination: Secondary | ICD-10-CM | POA: Diagnosis not present

## 2016-07-14 DIAGNOSIS — R278 Other lack of coordination: Secondary | ICD-10-CM | POA: Diagnosis not present

## 2016-07-15 DIAGNOSIS — R278 Other lack of coordination: Secondary | ICD-10-CM | POA: Diagnosis not present

## 2016-07-16 DIAGNOSIS — R278 Other lack of coordination: Secondary | ICD-10-CM | POA: Diagnosis not present

## 2016-07-16 DIAGNOSIS — N390 Urinary tract infection, site not specified: Secondary | ICD-10-CM | POA: Diagnosis not present

## 2016-07-17 DIAGNOSIS — R278 Other lack of coordination: Secondary | ICD-10-CM | POA: Diagnosis not present

## 2016-07-19 DIAGNOSIS — M129 Arthropathy, unspecified: Secondary | ICD-10-CM | POA: Diagnosis not present

## 2016-07-19 DIAGNOSIS — N181 Chronic kidney disease, stage 1: Secondary | ICD-10-CM | POA: Diagnosis not present

## 2016-07-19 DIAGNOSIS — R1311 Dysphagia, oral phase: Secondary | ICD-10-CM | POA: Diagnosis not present

## 2016-07-19 DIAGNOSIS — R269 Unspecified abnormalities of gait and mobility: Secondary | ICD-10-CM | POA: Diagnosis not present

## 2016-07-19 DIAGNOSIS — R278 Other lack of coordination: Secondary | ICD-10-CM | POA: Diagnosis not present

## 2016-07-20 DIAGNOSIS — R278 Other lack of coordination: Secondary | ICD-10-CM | POA: Diagnosis not present

## 2016-07-20 DIAGNOSIS — R1311 Dysphagia, oral phase: Secondary | ICD-10-CM | POA: Diagnosis not present

## 2016-07-22 DIAGNOSIS — R1311 Dysphagia, oral phase: Secondary | ICD-10-CM | POA: Diagnosis not present

## 2016-07-22 DIAGNOSIS — R278 Other lack of coordination: Secondary | ICD-10-CM | POA: Diagnosis not present

## 2016-07-23 DIAGNOSIS — R278 Other lack of coordination: Secondary | ICD-10-CM | POA: Diagnosis not present

## 2016-07-23 DIAGNOSIS — R1311 Dysphagia, oral phase: Secondary | ICD-10-CM | POA: Diagnosis not present

## 2016-07-24 DIAGNOSIS — R278 Other lack of coordination: Secondary | ICD-10-CM | POA: Diagnosis not present

## 2016-07-24 DIAGNOSIS — R1311 Dysphagia, oral phase: Secondary | ICD-10-CM | POA: Diagnosis not present

## 2016-07-26 DIAGNOSIS — R278 Other lack of coordination: Secondary | ICD-10-CM | POA: Diagnosis not present

## 2016-07-26 DIAGNOSIS — R1311 Dysphagia, oral phase: Secondary | ICD-10-CM | POA: Diagnosis not present

## 2016-07-27 DIAGNOSIS — R278 Other lack of coordination: Secondary | ICD-10-CM | POA: Diagnosis not present

## 2016-07-27 DIAGNOSIS — R1311 Dysphagia, oral phase: Secondary | ICD-10-CM | POA: Diagnosis not present

## 2016-07-28 DIAGNOSIS — R1311 Dysphagia, oral phase: Secondary | ICD-10-CM | POA: Diagnosis not present

## 2016-07-28 DIAGNOSIS — R278 Other lack of coordination: Secondary | ICD-10-CM | POA: Diagnosis not present

## 2016-07-29 DIAGNOSIS — R278 Other lack of coordination: Secondary | ICD-10-CM | POA: Diagnosis not present

## 2016-07-29 DIAGNOSIS — R1311 Dysphagia, oral phase: Secondary | ICD-10-CM | POA: Diagnosis not present

## 2016-07-30 DIAGNOSIS — R1311 Dysphagia, oral phase: Secondary | ICD-10-CM | POA: Diagnosis not present

## 2016-07-30 DIAGNOSIS — R278 Other lack of coordination: Secondary | ICD-10-CM | POA: Diagnosis not present

## 2016-08-02 DIAGNOSIS — R1311 Dysphagia, oral phase: Secondary | ICD-10-CM | POA: Diagnosis not present

## 2016-08-02 DIAGNOSIS — R278 Other lack of coordination: Secondary | ICD-10-CM | POA: Diagnosis not present

## 2016-08-03 DIAGNOSIS — R1311 Dysphagia, oral phase: Secondary | ICD-10-CM | POA: Diagnosis not present

## 2016-08-03 DIAGNOSIS — R278 Other lack of coordination: Secondary | ICD-10-CM | POA: Diagnosis not present

## 2016-08-03 DIAGNOSIS — R531 Weakness: Secondary | ICD-10-CM | POA: Diagnosis not present

## 2016-08-03 DIAGNOSIS — F0391 Unspecified dementia with behavioral disturbance: Secondary | ICD-10-CM | POA: Diagnosis not present

## 2016-08-03 DIAGNOSIS — N390 Urinary tract infection, site not specified: Secondary | ICD-10-CM | POA: Diagnosis not present

## 2016-08-03 DIAGNOSIS — R1310 Dysphagia, unspecified: Secondary | ICD-10-CM | POA: Diagnosis not present

## 2016-08-04 DIAGNOSIS — R278 Other lack of coordination: Secondary | ICD-10-CM | POA: Diagnosis not present

## 2016-08-04 DIAGNOSIS — R1311 Dysphagia, oral phase: Secondary | ICD-10-CM | POA: Diagnosis not present

## 2016-08-05 DIAGNOSIS — R1311 Dysphagia, oral phase: Secondary | ICD-10-CM | POA: Diagnosis not present

## 2016-08-05 DIAGNOSIS — R278 Other lack of coordination: Secondary | ICD-10-CM | POA: Diagnosis not present

## 2016-08-06 DIAGNOSIS — R1311 Dysphagia, oral phase: Secondary | ICD-10-CM | POA: Diagnosis not present

## 2016-08-06 DIAGNOSIS — R278 Other lack of coordination: Secondary | ICD-10-CM | POA: Diagnosis not present

## 2016-08-06 DIAGNOSIS — R5381 Other malaise: Secondary | ICD-10-CM | POA: Diagnosis not present

## 2016-08-09 DIAGNOSIS — F330 Major depressive disorder, recurrent, mild: Secondary | ICD-10-CM | POA: Diagnosis not present

## 2016-08-09 DIAGNOSIS — F0280 Dementia in other diseases classified elsewhere without behavioral disturbance: Secondary | ICD-10-CM | POA: Diagnosis not present

## 2016-08-09 DIAGNOSIS — F419 Anxiety disorder, unspecified: Secondary | ICD-10-CM | POA: Diagnosis not present

## 2016-08-09 DIAGNOSIS — F5101 Primary insomnia: Secondary | ICD-10-CM | POA: Diagnosis not present

## 2016-08-09 DIAGNOSIS — R1311 Dysphagia, oral phase: Secondary | ICD-10-CM | POA: Diagnosis not present

## 2016-08-09 DIAGNOSIS — R278 Other lack of coordination: Secondary | ICD-10-CM | POA: Diagnosis not present

## 2016-08-09 DIAGNOSIS — G301 Alzheimer's disease with late onset: Secondary | ICD-10-CM | POA: Diagnosis not present

## 2016-08-10 DIAGNOSIS — R1311 Dysphagia, oral phase: Secondary | ICD-10-CM | POA: Diagnosis not present

## 2016-08-10 DIAGNOSIS — D518 Other vitamin B12 deficiency anemias: Secondary | ICD-10-CM | POA: Diagnosis not present

## 2016-08-10 DIAGNOSIS — R278 Other lack of coordination: Secondary | ICD-10-CM | POA: Diagnosis not present

## 2016-08-10 DIAGNOSIS — E039 Hypothyroidism, unspecified: Secondary | ICD-10-CM | POA: Diagnosis not present

## 2016-08-11 DIAGNOSIS — R278 Other lack of coordination: Secondary | ICD-10-CM | POA: Diagnosis not present

## 2016-08-11 DIAGNOSIS — R1311 Dysphagia, oral phase: Secondary | ICD-10-CM | POA: Diagnosis not present

## 2016-08-12 DIAGNOSIS — R1311 Dysphagia, oral phase: Secondary | ICD-10-CM | POA: Diagnosis not present

## 2016-08-12 DIAGNOSIS — R278 Other lack of coordination: Secondary | ICD-10-CM | POA: Diagnosis not present

## 2016-08-13 DIAGNOSIS — R1311 Dysphagia, oral phase: Secondary | ICD-10-CM | POA: Diagnosis not present

## 2016-08-13 DIAGNOSIS — R278 Other lack of coordination: Secondary | ICD-10-CM | POA: Diagnosis not present

## 2016-08-16 DIAGNOSIS — R278 Other lack of coordination: Secondary | ICD-10-CM | POA: Diagnosis not present

## 2016-08-16 DIAGNOSIS — R1311 Dysphagia, oral phase: Secondary | ICD-10-CM | POA: Diagnosis not present

## 2016-08-17 DIAGNOSIS — R278 Other lack of coordination: Secondary | ICD-10-CM | POA: Diagnosis not present

## 2016-08-17 DIAGNOSIS — R1311 Dysphagia, oral phase: Secondary | ICD-10-CM | POA: Diagnosis not present

## 2016-08-18 DIAGNOSIS — R278 Other lack of coordination: Secondary | ICD-10-CM | POA: Diagnosis not present

## 2016-08-18 DIAGNOSIS — R1311 Dysphagia, oral phase: Secondary | ICD-10-CM | POA: Diagnosis not present

## 2016-08-19 DIAGNOSIS — R1311 Dysphagia, oral phase: Secondary | ICD-10-CM | POA: Diagnosis not present

## 2016-08-20 DIAGNOSIS — R1311 Dysphagia, oral phase: Secondary | ICD-10-CM | POA: Diagnosis not present

## 2016-08-21 DIAGNOSIS — R0989 Other specified symptoms and signs involving the circulatory and respiratory systems: Secondary | ICD-10-CM | POA: Diagnosis not present

## 2016-08-24 DIAGNOSIS — R093 Abnormal sputum: Secondary | ICD-10-CM | POA: Diagnosis not present

## 2016-08-24 DIAGNOSIS — Z79899 Other long term (current) drug therapy: Secondary | ICD-10-CM | POA: Diagnosis not present

## 2016-08-24 DIAGNOSIS — R531 Weakness: Secondary | ICD-10-CM | POA: Diagnosis not present

## 2016-08-24 DIAGNOSIS — R1311 Dysphagia, oral phase: Secondary | ICD-10-CM | POA: Diagnosis not present

## 2016-08-24 DIAGNOSIS — R1310 Dysphagia, unspecified: Secondary | ICD-10-CM | POA: Diagnosis not present

## 2016-08-24 DIAGNOSIS — J45909 Unspecified asthma, uncomplicated: Secondary | ICD-10-CM | POA: Diagnosis not present

## 2016-08-24 DIAGNOSIS — D649 Anemia, unspecified: Secondary | ICD-10-CM | POA: Diagnosis not present

## 2016-08-24 DIAGNOSIS — R05 Cough: Secondary | ICD-10-CM | POA: Diagnosis not present

## 2016-08-25 DIAGNOSIS — R1311 Dysphagia, oral phase: Secondary | ICD-10-CM | POA: Diagnosis not present

## 2016-08-26 DIAGNOSIS — R1311 Dysphagia, oral phase: Secondary | ICD-10-CM | POA: Diagnosis not present

## 2016-08-27 DIAGNOSIS — R1311 Dysphagia, oral phase: Secondary | ICD-10-CM | POA: Diagnosis not present

## 2016-08-28 DIAGNOSIS — R1311 Dysphagia, oral phase: Secondary | ICD-10-CM | POA: Diagnosis not present

## 2016-08-30 DIAGNOSIS — R1311 Dysphagia, oral phase: Secondary | ICD-10-CM | POA: Diagnosis not present

## 2016-08-31 DIAGNOSIS — R1311 Dysphagia, oral phase: Secondary | ICD-10-CM | POA: Diagnosis not present

## 2016-09-01 DIAGNOSIS — R1311 Dysphagia, oral phase: Secondary | ICD-10-CM | POA: Diagnosis not present

## 2016-09-02 DIAGNOSIS — R1311 Dysphagia, oral phase: Secondary | ICD-10-CM | POA: Diagnosis not present

## 2016-09-13 DIAGNOSIS — F5101 Primary insomnia: Secondary | ICD-10-CM | POA: Diagnosis not present

## 2016-09-13 DIAGNOSIS — G301 Alzheimer's disease with late onset: Secondary | ICD-10-CM | POA: Diagnosis not present

## 2016-09-13 DIAGNOSIS — F419 Anxiety disorder, unspecified: Secondary | ICD-10-CM | POA: Diagnosis not present

## 2016-09-13 DIAGNOSIS — F0280 Dementia in other diseases classified elsewhere without behavioral disturbance: Secondary | ICD-10-CM | POA: Diagnosis not present

## 2016-09-13 DIAGNOSIS — F330 Major depressive disorder, recurrent, mild: Secondary | ICD-10-CM | POA: Diagnosis not present

## 2016-09-17 ENCOUNTER — Encounter: Payer: Self-pay | Admitting: *Deleted

## 2016-09-20 ENCOUNTER — Encounter: Payer: Self-pay | Admitting: Cardiovascular Disease

## 2016-09-20 ENCOUNTER — Ambulatory Visit (INDEPENDENT_AMBULATORY_CARE_PROVIDER_SITE_OTHER): Payer: Medicare Other | Admitting: Cardiovascular Disease

## 2016-09-20 VITALS — BP 108/68 | HR 99

## 2016-09-20 DIAGNOSIS — I10 Essential (primary) hypertension: Secondary | ICD-10-CM

## 2016-09-20 DIAGNOSIS — R0602 Shortness of breath: Secondary | ICD-10-CM | POA: Diagnosis not present

## 2016-09-20 DIAGNOSIS — R079 Chest pain, unspecified: Secondary | ICD-10-CM | POA: Diagnosis not present

## 2016-09-20 NOTE — Progress Notes (Signed)
CARDIOLOGY CONSULT NOTE  Patient ID: Laura [name] MRN: [id number] DOB/AGE: 16-Jan-1924 83 y.o.  Admit date: (Not on file) Primary Physician: Sande Brothers, MD Referring Physician: Dr Mal Amabile  Reason for Consultation: CHF  HPI: 83 year old woman who presents for the evaluation of congestive heart failure.   Echocardiogram 10/09/14: Normal left ventricular systolic function, LVEF [id number], normal regional wall motion, grade 1 diastolic dysfunction, high ventricular filling pressures.  She has been a resident at [facility] for nearly a year. I do not have any records regarding this consultation. Her assistant tells me she has had no recent hospitalizations. She uses oxygen. She admits to having chest pain early in the morning. It is left-sided and lasts 2 hours. She believes she has had worsening shortness of breath. She denies orthopnea and paroxysmal nocturnal dyspnea.  ECG today shows sinus tachycardia, heart rate 103 bpm.    Allergies  Allergen Reactions  . Codeine     Makes her ill  . Vicodin [Hydrocodone-Acetaminophen]     Listed on nursing home [facility]    Current Outpatient Prescriptions  Medication Sig Dispense Refill  . acetaminophen (MAPAP) 500 MG tablet Take 500 mg by mouth every 6 (six) hours as needed for mild pain, fever or headache.     Marland Kitchen alum & mag hydroxide-simeth (MI-ACID MAXIMUM STRENGTH) 400-400-40 MG/5ML suspension Take 30 mLs by mouth every 6 (six) hours as needed for indigestion.     [name] Gel Base (HYDROGEL) GEL Apply 1 application topically daily as needed (for rash). Apply to face    . cholecalciferol (VITAMIN D) 1000 UNITS tablet Take 1,000 Units by mouth daily with breakfast. Reported on 09/30/2015    . Cranberry 250 MG CAPS Take 1 capsule by mouth daily.    Marland Kitchen donepezil (ARICEPT) 10 MG tablet TAKE 1 TABLET (10 MG TOTAL) BY MOUTH AT BEDTIME. 30 tablet 1  . Lactobacillus (PROBIOTIC ACIDOPHILUS PO) Take by mouth.    . lactose free nutrition  (BOOST PLUS) LIQD Take 237 mLs by mouth 2 (two) times daily between meals.  0  . loperamide (IMODIUM) 2 MG capsule Take 2 mg by mouth as needed for diarrhea or loose stools. NOT TO EXCEED 8 DOSES IN 24 HR    . magnesium hydroxide (MILK OF MAGNESIA) 400 MG/5ML suspension Take 30 mLs by mouth daily as needed for mild constipation.     . memantine (NAMENDA) 10 MG tablet Take 10 mg by mouth 2 (two) times daily.    Marland Kitchen oseltamivir (TAMIFLU) 75 MG capsule Take 75 mg by mouth daily.    Marland Kitchen PARoxetine (PAXIL) 20 MG tablet Take 20 mg by mouth daily.    . Protein (PROCEL PO) Take 2 scoop by mouth 2 (two) times daily. Reported on 09/30/2015    . risperiDONE (RISPERDAL) 0.25 MG tablet Take 0.25 mg by mouth 2 (two) times daily.     No current facility-administered medications for this visit.     Past Medical History:  Diagnosis Date  . Alzheimer's dementia   . Anxiety   . Arthritis   . COPD (chronic obstructive pulmonary disease) ([place])   . Depression   . Hypertension   . LBBB (left bundle branch block)   . Presence of permanent cardiac pacemaker    LOOP RECORDER DEVICE  . Syncope     Past Surgical History:  Procedure Laterality Date  . ANTERIOR HIP REVISION Left 11/27/2014   Procedure: REVISION OF LEFT HIP REPLACEMENT;  Surgeon: Leandrew Koyanagi,  MD;  Location: [place];  Service: Orthopedics;  Laterality: Left;  . LOOP RECORDER IMPLANT N/A 06/22/2012   Procedure: LOOP RECORDER IMPLANT;  Surgeon: Sanda Klein, MD;  Location: [place] CATH [facility];  Service: Cardiovascular;  Laterality: N/A;  . SHOULDER SURGERY     Left   . TOTAL HIP ARTHROPLASTY Left 11/07/2014   Procedure: Left hip anterior hemiarthroplasty;  Surgeon: Leandrew Koyanagi, MD;  Location: [place];  Service: Orthopedics;  Laterality: Left;    Social History   Social History  . Marital status: Divorced    Spouse name: N/A  . Number of children: N/A  . Years of education: N/A   Occupational History  . Not on file.   Social History Main Topics  .  Smoking status: Former Research scientist (life sciences)  . Smokeless tobacco: Never Used  . Alcohol use No  . Drug use: No  . Sexual activity: No   Other Topics Concern  . Not on file   Social History Narrative  . No narrative on file     No family history of premature CAD in 1st degree relatives.  Prior to Admission medications   Medication Sig Start Date End Date Taking? Authorizing Provider  acetaminophen (MAPAP) 500 MG tablet Take 500 mg by mouth every 6 (six) hours as needed for mild pain, fever or headache.    Yes Historical Provider, MD  alum & mag hydroxide-simeth (MI-ACID MAXIMUM STRENGTH) 400-400-40 MG/5ML suspension Take 30 mLs by mouth every 6 (six) hours as needed for indigestion.    Yes Historical Provider, MD  Carbomer Gel Base (HYDROGEL) GEL Apply 1 application topically daily as needed (for rash). Apply to face   Yes Historical Provider, MD  cholecalciferol (VITAMIN D) 1000 UNITS tablet Take 1,000 Units by mouth daily with breakfast. Reported on 09/30/2015   Yes Historical Provider, MD  Cranberry 250 MG CAPS Take 1 capsule by mouth daily.   Yes Historical Provider, MD  donepezil (ARICEPT) 10 MG tablet TAKE 1 TABLET (10 MG TOTAL) BY MOUTH AT BEDTIME. 01/12/16  Yes Dorena Dew, FNP  Lactobacillus (PROBIOTIC ACIDOPHILUS PO) Take by mouth.   Yes Historical Provider, MD  lactose free nutrition (BOOST PLUS) LIQD Take 237 mLs by mouth 2 (two) times daily between meals. 11/11/14  Yes Eugenie Filler, MD  loperamide (IMODIUM) 2 MG capsule Take 2 mg by mouth as needed for diarrhea or loose stools. NOT TO EXCEED 8 DOSES IN 24 HR   Yes Historical Provider, MD  magnesium hydroxide (MILK OF MAGNESIA) 400 MG/5ML suspension Take 30 mLs by mouth daily as needed for mild constipation.    Yes Historical Provider, MD  memantine (NAMENDA) 10 MG tablet Take 10 mg by mouth 2 (two) times daily.   Yes Historical Provider, MD  oseltamivir (TAMIFLU) 75 MG capsule Take 75 mg by mouth daily.   Yes Historical Provider, MD   PARoxetine (PAXIL) 20 MG tablet Take 20 mg by mouth daily.   Yes Historical Provider, MD  Protein (PROCEL PO) Take 2 scoop by mouth 2 (two) times daily. Reported on 09/30/2015   Yes Historical Provider, MD  risperiDONE (RISPERDAL) 0.25 MG tablet Take 0.25 mg by mouth 2 (two) times daily.   Yes Historical Provider, MD     Review of systems complete and found to be negative unless listed above in HPI     Physical exam Blood pressure 108/68, pulse 99, SpO2 93 %. General: NAD Neck: No JVD, no thyromegaly or thyroid nodule.  Lungs: Clear to  auscultation bilaterally with normal respiratory effort. CV: Nondisplaced PMI. Regular rate and rhythm, normal S1/S2, no S3/S4, no murmur.  No peripheral edema.  Chronic venous stasis of legs b/l. Abdomen: Soft, nontender, no distention.  Skin:Chronic venous stasis of legs b/l.  Neurologic: Alert.  Psych: Flat affect. HEENT: Normal.   ECG: Most recent ECG reviewed.  Telemetry: Independently reviewed.  Labs:   Lab Results  Component Value Date   WBC 8.3 09/30/2015   HGB 15.9 (H) 09/30/2015   HCT 49.4 (H) 09/30/2015   MCV 83.9 09/30/2015   PLT 269 09/30/2015   No results for input(s): NA, K, CL, CO2, BUN, CREATININE, CALCIUM, PROT, BILITOT, ALKPHOS, ALT, AST, GLUCOSE in the last 168 hours.  Invalid input(s): LABALBU Lab Results  Component Value Date   TROPONINI <0.03 10/08/2014   No results found for: CHOL No results found for: HDL No results found for: LDLCALC No results found for: TRIG No results found for: CHOLHDL No results found for: LDLDIRECT       Studies: No results found.  ASSESSMENT AND PLAN:  1. Chest pain and shortness of breath: I will order a 2-D echocardiogram with Doppler to evaluate cardiac structure, function, and regional wall motion. Currently not on diuretics. I will try to obtain records from her facility regarding this consultation and the specific question in mind.  Dispo: follow up to be  determined.   Signed: Kate Sable, M.D., F.A.C.C.  09/20/2016, 3:21 PM

## 2016-09-20 NOTE — Patient Instructions (Addendum)
Medication Instructions:  Continue all current medications.  Labwork: none  Testing/Procedures:  Your physician has requested that you have an echocardiogram. Echocardiography is a painless test that uses sound waves to create images of your heart. It provides your doctor with information about the size and shape of your heart and how well your heart's chambers and valves are working. This procedure takes approximately one hour. There are no restrictions for this procedure.  Office will contact with results via phone or letter.    Follow-Up: To be determined.    Any Other Special Instructions Will Be Listed Below (If Applicable).  If you need a refill on your cardiac medications before your next appointment, please call your pharmacy.  

## 2016-10-07 ENCOUNTER — Other Ambulatory Visit: Payer: Self-pay

## 2016-10-07 ENCOUNTER — Ambulatory Visit (INDEPENDENT_AMBULATORY_CARE_PROVIDER_SITE_OTHER): Payer: Medicare Other

## 2016-10-07 DIAGNOSIS — R0602 Shortness of breath: Secondary | ICD-10-CM | POA: Diagnosis not present

## 2016-10-11 DIAGNOSIS — G301 Alzheimer's disease with late onset: Secondary | ICD-10-CM | POA: Diagnosis not present

## 2016-10-11 DIAGNOSIS — F5101 Primary insomnia: Secondary | ICD-10-CM | POA: Diagnosis not present

## 2016-10-11 DIAGNOSIS — F0280 Dementia in other diseases classified elsewhere without behavioral disturbance: Secondary | ICD-10-CM | POA: Diagnosis not present

## 2016-10-11 DIAGNOSIS — F419 Anxiety disorder, unspecified: Secondary | ICD-10-CM | POA: Diagnosis not present

## 2016-10-11 DIAGNOSIS — F330 Major depressive disorder, recurrent, mild: Secondary | ICD-10-CM | POA: Diagnosis not present

## 2016-10-15 DIAGNOSIS — R2681 Unsteadiness on feet: Secondary | ICD-10-CM | POA: Diagnosis not present

## 2016-10-15 DIAGNOSIS — I509 Heart failure, unspecified: Secondary | ICD-10-CM | POA: Diagnosis not present

## 2016-10-15 DIAGNOSIS — M6281 Muscle weakness (generalized): Secondary | ICD-10-CM | POA: Diagnosis not present

## 2016-10-15 DIAGNOSIS — F339 Major depressive disorder, recurrent, unspecified: Secondary | ICD-10-CM | POA: Diagnosis not present

## 2016-10-18 ENCOUNTER — Telehealth: Payer: Self-pay | Admitting: *Deleted

## 2016-10-18 NOTE — Telephone Encounter (Signed)
Notes recorded by Laurine Blazer, LPN on 12/23/2092 at 7:09 AM EDT Nursing staff at [facility] notified. Copy to [place]. ------  Notes recorded by Laurine Blazer, LPN on 01/13/3661 at 9:47 PM EDT Patient currently resides at [facility]. 2 attempts to give test results. Spoke with staff member that transferred me to different person. No answer x 2. ------  Notes recorded by Herminio Commons, MD on 10/07/2016 at 4:11 PM EDT Normal pumping function.

## 2016-11-02 DIAGNOSIS — F0391 Unspecified dementia with behavioral disturbance: Secondary | ICD-10-CM | POA: Diagnosis not present

## 2016-11-02 DIAGNOSIS — J45909 Unspecified asthma, uncomplicated: Secondary | ICD-10-CM | POA: Diagnosis not present

## 2016-11-02 DIAGNOSIS — N649 Disorder of breast, unspecified: Secondary | ICD-10-CM | POA: Diagnosis not present

## 2016-11-08 ENCOUNTER — Other Ambulatory Visit: Payer: Self-pay | Admitting: Internal Medicine

## 2016-11-08 DIAGNOSIS — Z1231 Encounter for screening mammogram for malignant neoplasm of breast: Secondary | ICD-10-CM

## 2016-11-09 DIAGNOSIS — F419 Anxiety disorder, unspecified: Secondary | ICD-10-CM | POA: Diagnosis not present

## 2016-11-09 DIAGNOSIS — F5101 Primary insomnia: Secondary | ICD-10-CM | POA: Diagnosis not present

## 2016-11-09 DIAGNOSIS — G301 Alzheimer's disease with late onset: Secondary | ICD-10-CM | POA: Diagnosis not present

## 2016-11-09 DIAGNOSIS — F330 Major depressive disorder, recurrent, mild: Secondary | ICD-10-CM | POA: Diagnosis not present

## 2016-11-09 DIAGNOSIS — F0280 Dementia in other diseases classified elsewhere without behavioral disturbance: Secondary | ICD-10-CM | POA: Diagnosis not present

## 2016-11-19 DIAGNOSIS — N649 Disorder of breast, unspecified: Secondary | ICD-10-CM | POA: Diagnosis not present

## 2016-11-19 DIAGNOSIS — R233 Spontaneous ecchymoses: Secondary | ICD-10-CM | POA: Diagnosis not present

## 2016-11-19 DIAGNOSIS — G309 Alzheimer's disease, unspecified: Secondary | ICD-10-CM | POA: Diagnosis not present

## 2016-11-24 ENCOUNTER — Ambulatory Visit: Payer: Medicare Other

## 2016-12-14 ENCOUNTER — Ambulatory Visit
Admission: RE | Admit: 2016-12-14 | Discharge: 2016-12-14 | Disposition: A | Discharge status: Home / Self Care | Payer: Medicare Other | Source: Ambulatory Visit | Attending: Internal Medicine | Admitting: Internal Medicine

## 2016-12-14 DIAGNOSIS — Z1231 Encounter for screening mammogram for malignant neoplasm of breast: Secondary | ICD-10-CM | POA: Diagnosis not present

## 2016-12-14 DIAGNOSIS — G301 Alzheimer's disease with late onset: Secondary | ICD-10-CM | POA: Diagnosis not present

## 2016-12-14 DIAGNOSIS — F330 Major depressive disorder, recurrent, mild: Secondary | ICD-10-CM | POA: Diagnosis not present

## 2016-12-14 DIAGNOSIS — F419 Anxiety disorder, unspecified: Secondary | ICD-10-CM | POA: Diagnosis not present

## 2016-12-14 DIAGNOSIS — F5101 Primary insomnia: Secondary | ICD-10-CM | POA: Diagnosis not present

## 2016-12-14 DIAGNOSIS — F0280 Dementia in other diseases classified elsewhere without behavioral disturbance: Secondary | ICD-10-CM | POA: Diagnosis not present

## 2016-12-14 DIAGNOSIS — R1311 Dysphagia, oral phase: Secondary | ICD-10-CM | POA: Diagnosis not present

## 2016-12-16 DIAGNOSIS — R1311 Dysphagia, oral phase: Secondary | ICD-10-CM | POA: Diagnosis not present

## 2016-12-17 DIAGNOSIS — R4182 Altered mental status, unspecified: Secondary | ICD-10-CM | POA: Diagnosis not present

## 2016-12-17 DIAGNOSIS — R1311 Dysphagia, oral phase: Secondary | ICD-10-CM | POA: Diagnosis not present

## 2016-12-20 DIAGNOSIS — R4182 Altered mental status, unspecified: Secondary | ICD-10-CM | POA: Diagnosis not present

## 2016-12-20 DIAGNOSIS — R1311 Dysphagia, oral phase: Secondary | ICD-10-CM | POA: Diagnosis not present

## 2016-12-21 DIAGNOSIS — R4182 Altered mental status, unspecified: Secondary | ICD-10-CM | POA: Diagnosis not present

## 2016-12-21 DIAGNOSIS — R1311 Dysphagia, oral phase: Secondary | ICD-10-CM | POA: Diagnosis not present

## 2016-12-22 DIAGNOSIS — R1311 Dysphagia, oral phase: Secondary | ICD-10-CM | POA: Diagnosis not present

## 2016-12-22 DIAGNOSIS — R4182 Altered mental status, unspecified: Secondary | ICD-10-CM | POA: Diagnosis not present

## 2016-12-23 DIAGNOSIS — R4182 Altered mental status, unspecified: Secondary | ICD-10-CM | POA: Diagnosis not present

## 2016-12-23 DIAGNOSIS — R1311 Dysphagia, oral phase: Secondary | ICD-10-CM | POA: Diagnosis not present

## 2016-12-24 DIAGNOSIS — R4182 Altered mental status, unspecified: Secondary | ICD-10-CM | POA: Diagnosis not present

## 2016-12-24 DIAGNOSIS — R1311 Dysphagia, oral phase: Secondary | ICD-10-CM | POA: Diagnosis not present

## 2016-12-26 DIAGNOSIS — R4182 Altered mental status, unspecified: Secondary | ICD-10-CM | POA: Diagnosis not present

## 2016-12-26 DIAGNOSIS — R1311 Dysphagia, oral phase: Secondary | ICD-10-CM | POA: Diagnosis not present

## 2016-12-26 DIAGNOSIS — R0989 Other specified symptoms and signs involving the circulatory and respiratory systems: Secondary | ICD-10-CM | POA: Diagnosis not present

## 2016-12-27 DIAGNOSIS — R1311 Dysphagia, oral phase: Secondary | ICD-10-CM | POA: Diagnosis not present

## 2016-12-27 DIAGNOSIS — R4182 Altered mental status, unspecified: Secondary | ICD-10-CM | POA: Diagnosis not present

## 2016-12-28 DIAGNOSIS — R41841 Cognitive communication deficit: Secondary | ICD-10-CM | POA: Diagnosis not present

## 2016-12-28 DIAGNOSIS — F0391 Unspecified dementia with behavioral disturbance: Secondary | ICD-10-CM | POA: Diagnosis not present

## 2016-12-28 DIAGNOSIS — R1311 Dysphagia, oral phase: Secondary | ICD-10-CM | POA: Diagnosis not present

## 2016-12-28 DIAGNOSIS — G309 Alzheimer's disease, unspecified: Secondary | ICD-10-CM | POA: Diagnosis not present

## 2016-12-28 DIAGNOSIS — R1310 Dysphagia, unspecified: Secondary | ICD-10-CM | POA: Diagnosis not present

## 2016-12-28 DIAGNOSIS — R4182 Altered mental status, unspecified: Secondary | ICD-10-CM | POA: Diagnosis not present

## 2016-12-29 DIAGNOSIS — R1311 Dysphagia, oral phase: Secondary | ICD-10-CM | POA: Diagnosis not present

## 2016-12-29 DIAGNOSIS — R4182 Altered mental status, unspecified: Secondary | ICD-10-CM | POA: Diagnosis not present

## 2016-12-30 DIAGNOSIS — R4182 Altered mental status, unspecified: Secondary | ICD-10-CM | POA: Diagnosis not present

## 2016-12-30 DIAGNOSIS — R1311 Dysphagia, oral phase: Secondary | ICD-10-CM | POA: Diagnosis not present

## 2016-12-31 DIAGNOSIS — R1311 Dysphagia, oral phase: Secondary | ICD-10-CM | POA: Diagnosis not present

## 2016-12-31 DIAGNOSIS — R4182 Altered mental status, unspecified: Secondary | ICD-10-CM | POA: Diagnosis not present

## 2017-01-03 DIAGNOSIS — R1311 Dysphagia, oral phase: Secondary | ICD-10-CM | POA: Diagnosis not present

## 2017-01-03 DIAGNOSIS — R4182 Altered mental status, unspecified: Secondary | ICD-10-CM | POA: Diagnosis not present

## 2017-01-04 DIAGNOSIS — H109 Unspecified conjunctivitis: Secondary | ICD-10-CM | POA: Diagnosis not present

## 2017-01-04 DIAGNOSIS — F419 Anxiety disorder, unspecified: Secondary | ICD-10-CM | POA: Diagnosis not present

## 2017-01-04 DIAGNOSIS — R1311 Dysphagia, oral phase: Secondary | ICD-10-CM | POA: Diagnosis not present

## 2017-01-04 DIAGNOSIS — R4182 Altered mental status, unspecified: Secondary | ICD-10-CM | POA: Diagnosis not present

## 2017-01-04 DIAGNOSIS — F339 Major depressive disorder, recurrent, unspecified: Secondary | ICD-10-CM | POA: Diagnosis not present

## 2017-01-04 DIAGNOSIS — R1310 Dysphagia, unspecified: Secondary | ICD-10-CM | POA: Diagnosis not present

## 2017-01-05 DIAGNOSIS — R1311 Dysphagia, oral phase: Secondary | ICD-10-CM | POA: Diagnosis not present

## 2017-01-05 DIAGNOSIS — R4182 Altered mental status, unspecified: Secondary | ICD-10-CM | POA: Diagnosis not present

## 2017-01-06 DIAGNOSIS — R1311 Dysphagia, oral phase: Secondary | ICD-10-CM | POA: Diagnosis not present

## 2017-01-06 DIAGNOSIS — R4182 Altered mental status, unspecified: Secondary | ICD-10-CM | POA: Diagnosis not present

## 2017-01-07 DIAGNOSIS — R1311 Dysphagia, oral phase: Secondary | ICD-10-CM | POA: Diagnosis not present

## 2017-01-07 DIAGNOSIS — R4182 Altered mental status, unspecified: Secondary | ICD-10-CM | POA: Diagnosis not present

## 2017-01-10 DIAGNOSIS — R1311 Dysphagia, oral phase: Secondary | ICD-10-CM | POA: Diagnosis not present

## 2017-01-10 DIAGNOSIS — R4182 Altered mental status, unspecified: Secondary | ICD-10-CM | POA: Diagnosis not present

## 2017-01-11 DIAGNOSIS — R1311 Dysphagia, oral phase: Secondary | ICD-10-CM | POA: Diagnosis not present

## 2017-01-11 DIAGNOSIS — R4182 Altered mental status, unspecified: Secondary | ICD-10-CM | POA: Diagnosis not present

## 2017-01-12 DIAGNOSIS — R4182 Altered mental status, unspecified: Secondary | ICD-10-CM | POA: Diagnosis not present

## 2017-01-12 DIAGNOSIS — R1311 Dysphagia, oral phase: Secondary | ICD-10-CM | POA: Diagnosis not present

## 2017-01-13 DIAGNOSIS — R1311 Dysphagia, oral phase: Secondary | ICD-10-CM | POA: Diagnosis not present

## 2017-01-13 DIAGNOSIS — R4182 Altered mental status, unspecified: Secondary | ICD-10-CM | POA: Diagnosis not present

## 2017-01-14 DIAGNOSIS — R4182 Altered mental status, unspecified: Secondary | ICD-10-CM | POA: Diagnosis not present

## 2017-01-14 DIAGNOSIS — R1311 Dysphagia, oral phase: Secondary | ICD-10-CM | POA: Diagnosis not present

## 2017-01-16 DIAGNOSIS — R1311 Dysphagia, oral phase: Secondary | ICD-10-CM | POA: Diagnosis not present

## 2017-01-16 DIAGNOSIS — R4182 Altered mental status, unspecified: Secondary | ICD-10-CM | POA: Diagnosis not present

## 2017-01-17 DIAGNOSIS — R1311 Dysphagia, oral phase: Secondary | ICD-10-CM | POA: Diagnosis not present

## 2017-01-17 DIAGNOSIS — R4182 Altered mental status, unspecified: Secondary | ICD-10-CM | POA: Diagnosis not present

## 2017-01-18 DIAGNOSIS — R4182 Altered mental status, unspecified: Secondary | ICD-10-CM | POA: Diagnosis not present

## 2017-01-18 DIAGNOSIS — R1311 Dysphagia, oral phase: Secondary | ICD-10-CM | POA: Diagnosis not present

## 2017-01-19 DIAGNOSIS — R1311 Dysphagia, oral phase: Secondary | ICD-10-CM | POA: Diagnosis not present

## 2017-01-19 DIAGNOSIS — R4182 Altered mental status, unspecified: Secondary | ICD-10-CM | POA: Diagnosis not present

## 2017-01-20 DIAGNOSIS — R4182 Altered mental status, unspecified: Secondary | ICD-10-CM | POA: Diagnosis not present

## 2017-01-20 DIAGNOSIS — R1311 Dysphagia, oral phase: Secondary | ICD-10-CM | POA: Diagnosis not present

## 2017-01-25 DIAGNOSIS — F419 Anxiety disorder, unspecified: Secondary | ICD-10-CM | POA: Diagnosis not present

## 2017-01-25 DIAGNOSIS — R1311 Dysphagia, oral phase: Secondary | ICD-10-CM | POA: Diagnosis not present

## 2017-01-25 DIAGNOSIS — F0280 Dementia in other diseases classified elsewhere without behavioral disturbance: Secondary | ICD-10-CM | POA: Diagnosis not present

## 2017-01-25 DIAGNOSIS — F330 Major depressive disorder, recurrent, mild: Secondary | ICD-10-CM | POA: Diagnosis not present

## 2017-01-25 DIAGNOSIS — R4182 Altered mental status, unspecified: Secondary | ICD-10-CM | POA: Diagnosis not present

## 2017-01-25 DIAGNOSIS — G301 Alzheimer's disease with late onset: Secondary | ICD-10-CM | POA: Diagnosis not present

## 2017-01-25 DIAGNOSIS — F5101 Primary insomnia: Secondary | ICD-10-CM | POA: Diagnosis not present

## 2017-01-26 DIAGNOSIS — R4182 Altered mental status, unspecified: Secondary | ICD-10-CM | POA: Diagnosis not present

## 2017-01-26 DIAGNOSIS — R1311 Dysphagia, oral phase: Secondary | ICD-10-CM | POA: Diagnosis not present

## 2017-02-15 DIAGNOSIS — Z961 Presence of intraocular lens: Secondary | ICD-10-CM | POA: Diagnosis not present

## 2017-02-15 DIAGNOSIS — Z7951 Long term (current) use of inhaled steroids: Secondary | ICD-10-CM | POA: Diagnosis not present

## 2017-02-15 DIAGNOSIS — H26493 Other secondary cataract, bilateral: Secondary | ICD-10-CM | POA: Diagnosis not present

## 2017-03-03 DIAGNOSIS — R278 Other lack of coordination: Secondary | ICD-10-CM | POA: Diagnosis not present

## 2017-03-08 DIAGNOSIS — F330 Major depressive disorder, recurrent, mild: Secondary | ICD-10-CM | POA: Diagnosis not present

## 2017-03-08 DIAGNOSIS — F419 Anxiety disorder, unspecified: Secondary | ICD-10-CM | POA: Diagnosis not present

## 2017-03-08 DIAGNOSIS — F0280 Dementia in other diseases classified elsewhere without behavioral disturbance: Secondary | ICD-10-CM | POA: Diagnosis not present

## 2017-03-08 DIAGNOSIS — G301 Alzheimer's disease with late onset: Secondary | ICD-10-CM | POA: Diagnosis not present

## 2017-03-08 DIAGNOSIS — F5101 Primary insomnia: Secondary | ICD-10-CM | POA: Diagnosis not present

## 2017-03-11 DIAGNOSIS — F0391 Unspecified dementia with behavioral disturbance: Secondary | ICD-10-CM | POA: Diagnosis not present

## 2017-03-11 DIAGNOSIS — F419 Anxiety disorder, unspecified: Secondary | ICD-10-CM | POA: Diagnosis not present

## 2017-03-11 DIAGNOSIS — R1310 Dysphagia, unspecified: Secondary | ICD-10-CM | POA: Diagnosis not present

## 2017-03-11 DIAGNOSIS — F339 Major depressive disorder, recurrent, unspecified: Secondary | ICD-10-CM | POA: Diagnosis not present

## 2017-04-05 DIAGNOSIS — F339 Major depressive disorder, recurrent, unspecified: Secondary | ICD-10-CM | POA: Diagnosis not present

## 2017-04-05 DIAGNOSIS — F419 Anxiety disorder, unspecified: Secondary | ICD-10-CM | POA: Diagnosis not present

## 2017-04-05 DIAGNOSIS — G309 Alzheimer's disease, unspecified: Secondary | ICD-10-CM | POA: Diagnosis not present

## 2017-04-05 DIAGNOSIS — R1310 Dysphagia, unspecified: Secondary | ICD-10-CM | POA: Diagnosis not present

## 2017-04-19 DIAGNOSIS — F5101 Primary insomnia: Secondary | ICD-10-CM | POA: Diagnosis not present

## 2017-04-19 DIAGNOSIS — F0280 Dementia in other diseases classified elsewhere without behavioral disturbance: Secondary | ICD-10-CM | POA: Diagnosis not present

## 2017-04-19 DIAGNOSIS — F330 Major depressive disorder, recurrent, mild: Secondary | ICD-10-CM | POA: Diagnosis not present

## 2017-04-19 DIAGNOSIS — F419 Anxiety disorder, unspecified: Secondary | ICD-10-CM | POA: Diagnosis not present

## 2017-04-19 DIAGNOSIS — G301 Alzheimer's disease with late onset: Secondary | ICD-10-CM | POA: Diagnosis not present

## 2017-04-20 DIAGNOSIS — F419 Anxiety disorder, unspecified: Secondary | ICD-10-CM | POA: Diagnosis not present

## 2017-04-20 DIAGNOSIS — F5101 Primary insomnia: Secondary | ICD-10-CM | POA: Diagnosis not present

## 2017-04-20 DIAGNOSIS — G301 Alzheimer's disease with late onset: Secondary | ICD-10-CM | POA: Diagnosis not present

## 2017-04-20 DIAGNOSIS — F330 Major depressive disorder, recurrent, mild: Secondary | ICD-10-CM | POA: Diagnosis not present

## 2017-04-20 DIAGNOSIS — F0280 Dementia in other diseases classified elsewhere without behavioral disturbance: Secondary | ICD-10-CM | POA: Diagnosis not present

## 2017-05-06 DIAGNOSIS — F419 Anxiety disorder, unspecified: Secondary | ICD-10-CM | POA: Diagnosis not present

## 2017-05-06 DIAGNOSIS — R1310 Dysphagia, unspecified: Secondary | ICD-10-CM | POA: Diagnosis not present

## 2017-05-06 DIAGNOSIS — F339 Major depressive disorder, recurrent, unspecified: Secondary | ICD-10-CM | POA: Diagnosis not present

## 2017-05-06 DIAGNOSIS — F0391 Unspecified dementia with behavioral disturbance: Secondary | ICD-10-CM | POA: Diagnosis not present

## 2017-05-17 DIAGNOSIS — R0989 Other specified symptoms and signs involving the circulatory and respiratory systems: Secondary | ICD-10-CM | POA: Diagnosis not present

## 2017-05-17 DIAGNOSIS — R05 Cough: Secondary | ICD-10-CM | POA: Diagnosis not present

## 2017-05-18 DIAGNOSIS — R4182 Altered mental status, unspecified: Secondary | ICD-10-CM | POA: Diagnosis not present

## 2017-05-18 DIAGNOSIS — R1311 Dysphagia, oral phase: Secondary | ICD-10-CM | POA: Diagnosis not present

## 2017-05-19 DIAGNOSIS — R1311 Dysphagia, oral phase: Secondary | ICD-10-CM | POA: Diagnosis not present

## 2017-05-20 DIAGNOSIS — R1311 Dysphagia, oral phase: Secondary | ICD-10-CM | POA: Diagnosis not present

## 2017-05-23 DIAGNOSIS — R1311 Dysphagia, oral phase: Secondary | ICD-10-CM | POA: Diagnosis not present

## 2017-05-24 DIAGNOSIS — R1311 Dysphagia, oral phase: Secondary | ICD-10-CM | POA: Diagnosis not present

## 2017-05-25 DIAGNOSIS — R1311 Dysphagia, oral phase: Secondary | ICD-10-CM | POA: Diagnosis not present

## 2017-05-26 DIAGNOSIS — R1311 Dysphagia, oral phase: Secondary | ICD-10-CM | POA: Diagnosis not present

## 2017-05-27 DIAGNOSIS — R1311 Dysphagia, oral phase: Secondary | ICD-10-CM | POA: Diagnosis not present

## 2017-05-30 DIAGNOSIS — R1311 Dysphagia, oral phase: Secondary | ICD-10-CM | POA: Diagnosis not present

## 2017-05-31 DIAGNOSIS — R1311 Dysphagia, oral phase: Secondary | ICD-10-CM | POA: Diagnosis not present

## 2017-06-01 DIAGNOSIS — R1311 Dysphagia, oral phase: Secondary | ICD-10-CM | POA: Diagnosis not present

## 2017-06-02 DIAGNOSIS — R1311 Dysphagia, oral phase: Secondary | ICD-10-CM | POA: Diagnosis not present

## 2017-06-03 DIAGNOSIS — R1311 Dysphagia, oral phase: Secondary | ICD-10-CM | POA: Diagnosis not present

## 2017-06-06 DIAGNOSIS — R1311 Dysphagia, oral phase: Secondary | ICD-10-CM | POA: Diagnosis not present

## 2017-06-07 DIAGNOSIS — R1311 Dysphagia, oral phase: Secondary | ICD-10-CM | POA: Diagnosis not present

## 2017-06-08 DIAGNOSIS — R1311 Dysphagia, oral phase: Secondary | ICD-10-CM | POA: Diagnosis not present

## 2017-06-10 DIAGNOSIS — R1311 Dysphagia, oral phase: Secondary | ICD-10-CM | POA: Diagnosis not present

## 2017-06-16 DIAGNOSIS — R1310 Dysphagia, unspecified: Secondary | ICD-10-CM | POA: Diagnosis not present

## 2017-06-16 DIAGNOSIS — G309 Alzheimer's disease, unspecified: Secondary | ICD-10-CM | POA: Diagnosis not present

## 2017-06-16 DIAGNOSIS — F0391 Unspecified dementia with behavioral disturbance: Secondary | ICD-10-CM | POA: Diagnosis not present

## 2017-06-16 DIAGNOSIS — E440 Moderate protein-calorie malnutrition: Secondary | ICD-10-CM | POA: Diagnosis not present

## 2017-06-23 DIAGNOSIS — F0391 Unspecified dementia with behavioral disturbance: Secondary | ICD-10-CM | POA: Diagnosis not present

## 2017-06-23 DIAGNOSIS — F339 Major depressive disorder, recurrent, unspecified: Secondary | ICD-10-CM | POA: Diagnosis not present

## 2017-06-23 DIAGNOSIS — F419 Anxiety disorder, unspecified: Secondary | ICD-10-CM | POA: Diagnosis not present

## 2017-06-23 DIAGNOSIS — R1310 Dysphagia, unspecified: Secondary | ICD-10-CM | POA: Diagnosis not present

## 2017-08-04 DIAGNOSIS — R32 Unspecified urinary incontinence: Secondary | ICD-10-CM | POA: Diagnosis not present

## 2017-08-04 DIAGNOSIS — G309 Alzheimer's disease, unspecified: Secondary | ICD-10-CM | POA: Diagnosis not present

## 2017-08-04 DIAGNOSIS — J45909 Unspecified asthma, uncomplicated: Secondary | ICD-10-CM | POA: Diagnosis not present

## 2017-08-04 DIAGNOSIS — M6281 Muscle weakness (generalized): Secondary | ICD-10-CM | POA: Diagnosis not present

## 2017-08-19 DIAGNOSIS — F419 Anxiety disorder, unspecified: Secondary | ICD-10-CM | POA: Diagnosis not present

## 2017-08-19 DIAGNOSIS — F330 Major depressive disorder, recurrent, mild: Secondary | ICD-10-CM | POA: Diagnosis not present

## 2017-08-19 DIAGNOSIS — F0280 Dementia in other diseases classified elsewhere without behavioral disturbance: Secondary | ICD-10-CM | POA: Diagnosis not present

## 2017-08-19 DIAGNOSIS — G301 Alzheimer's disease with late onset: Secondary | ICD-10-CM | POA: Diagnosis not present

## 2017-08-22 DIAGNOSIS — J45909 Unspecified asthma, uncomplicated: Secondary | ICD-10-CM | POA: Diagnosis not present

## 2017-08-22 DIAGNOSIS — F419 Anxiety disorder, unspecified: Secondary | ICD-10-CM | POA: Diagnosis not present

## 2017-08-22 DIAGNOSIS — F339 Major depressive disorder, recurrent, unspecified: Secondary | ICD-10-CM | POA: Diagnosis not present

## 2017-08-22 DIAGNOSIS — R1310 Dysphagia, unspecified: Secondary | ICD-10-CM | POA: Diagnosis not present

## 2017-08-22 DIAGNOSIS — F0391 Unspecified dementia with behavioral disturbance: Secondary | ICD-10-CM | POA: Diagnosis not present

## 2017-08-25 DIAGNOSIS — L02215 Cutaneous abscess of perineum: Secondary | ICD-10-CM | POA: Diagnosis not present

## 2017-08-25 DIAGNOSIS — G309 Alzheimer's disease, unspecified: Secondary | ICD-10-CM | POA: Diagnosis not present

## 2017-08-25 DIAGNOSIS — F0391 Unspecified dementia with behavioral disturbance: Secondary | ICD-10-CM | POA: Diagnosis not present

## 2017-08-25 DIAGNOSIS — J45909 Unspecified asthma, uncomplicated: Secondary | ICD-10-CM | POA: Diagnosis not present

## 2017-09-29 DIAGNOSIS — F0391 Unspecified dementia with behavioral disturbance: Secondary | ICD-10-CM | POA: Diagnosis not present

## 2017-09-29 DIAGNOSIS — F339 Major depressive disorder, recurrent, unspecified: Secondary | ICD-10-CM | POA: Diagnosis not present

## 2017-09-29 DIAGNOSIS — R1310 Dysphagia, unspecified: Secondary | ICD-10-CM | POA: Diagnosis not present

## 2017-09-29 DIAGNOSIS — F419 Anxiety disorder, unspecified: Secondary | ICD-10-CM | POA: Diagnosis not present

## 2017-10-07 DIAGNOSIS — R1310 Dysphagia, unspecified: Secondary | ICD-10-CM | POA: Diagnosis not present

## 2017-10-07 DIAGNOSIS — F0391 Unspecified dementia with behavioral disturbance: Secondary | ICD-10-CM | POA: Diagnosis not present

## 2017-10-07 DIAGNOSIS — F419 Anxiety disorder, unspecified: Secondary | ICD-10-CM | POA: Diagnosis not present

## 2017-10-07 DIAGNOSIS — F339 Major depressive disorder, recurrent, unspecified: Secondary | ICD-10-CM | POA: Diagnosis not present

## 2017-10-28 DIAGNOSIS — F0280 Dementia in other diseases classified elsewhere without behavioral disturbance: Secondary | ICD-10-CM | POA: Diagnosis not present

## 2017-10-28 DIAGNOSIS — G301 Alzheimer's disease with late onset: Secondary | ICD-10-CM | POA: Diagnosis not present

## 2017-10-28 DIAGNOSIS — F330 Major depressive disorder, recurrent, mild: Secondary | ICD-10-CM | POA: Diagnosis not present

## 2017-11-01 DIAGNOSIS — F0391 Unspecified dementia with behavioral disturbance: Secondary | ICD-10-CM | POA: Diagnosis not present

## 2017-11-01 DIAGNOSIS — R109 Unspecified abdominal pain: Secondary | ICD-10-CM | POA: Diagnosis not present

## 2017-11-01 DIAGNOSIS — J45909 Unspecified asthma, uncomplicated: Secondary | ICD-10-CM | POA: Diagnosis not present

## 2017-11-01 DIAGNOSIS — G309 Alzheimer's disease, unspecified: Secondary | ICD-10-CM | POA: Diagnosis not present

## 2017-11-22 DIAGNOSIS — Z79899 Other long term (current) drug therapy: Secondary | ICD-10-CM | POA: Diagnosis not present

## 2017-11-29 DIAGNOSIS — F419 Anxiety disorder, unspecified: Secondary | ICD-10-CM | POA: Diagnosis not present

## 2017-11-29 DIAGNOSIS — R1310 Dysphagia, unspecified: Secondary | ICD-10-CM | POA: Diagnosis not present

## 2017-11-29 DIAGNOSIS — F0391 Unspecified dementia with behavioral disturbance: Secondary | ICD-10-CM | POA: Diagnosis not present

## 2017-11-29 DIAGNOSIS — F339 Major depressive disorder, recurrent, unspecified: Secondary | ICD-10-CM | POA: Diagnosis not present

## 2017-11-30 DIAGNOSIS — D72829 Elevated white blood cell count, unspecified: Secondary | ICD-10-CM | POA: Diagnosis not present

## 2017-11-30 DIAGNOSIS — Z79899 Other long term (current) drug therapy: Secondary | ICD-10-CM | POA: Diagnosis not present

## 2017-12-02 DIAGNOSIS — F330 Major depressive disorder, recurrent, mild: Secondary | ICD-10-CM | POA: Diagnosis not present

## 2017-12-02 DIAGNOSIS — F0280 Dementia in other diseases classified elsewhere without behavioral disturbance: Secondary | ICD-10-CM | POA: Diagnosis not present

## 2017-12-02 DIAGNOSIS — G301 Alzheimer's disease with late onset: Secondary | ICD-10-CM | POA: Diagnosis not present

## 2017-12-20 DIAGNOSIS — F339 Major depressive disorder, recurrent, unspecified: Secondary | ICD-10-CM | POA: Diagnosis not present

## 2017-12-20 DIAGNOSIS — E440 Moderate protein-calorie malnutrition: Secondary | ICD-10-CM | POA: Diagnosis not present

## 2017-12-20 DIAGNOSIS — J45909 Unspecified asthma, uncomplicated: Secondary | ICD-10-CM | POA: Diagnosis not present

## 2017-12-20 DIAGNOSIS — F0391 Unspecified dementia with behavioral disturbance: Secondary | ICD-10-CM | POA: Diagnosis not present

## 2018-01-06 DIAGNOSIS — G301 Alzheimer's disease with late onset: Secondary | ICD-10-CM | POA: Diagnosis not present

## 2018-01-06 DIAGNOSIS — F0280 Dementia in other diseases classified elsewhere without behavioral disturbance: Secondary | ICD-10-CM | POA: Diagnosis not present

## 2018-01-06 DIAGNOSIS — F330 Major depressive disorder, recurrent, mild: Secondary | ICD-10-CM | POA: Diagnosis not present

## 2018-01-19 ENCOUNTER — Other Ambulatory Visit
Admission: RE | Admit: 2018-01-19 | Discharge: 2018-01-19 | Disposition: A | Discharge status: Home / Self Care | Payer: Medicare Other | Source: Ambulatory Visit | Attending: Internal Medicine | Admitting: Internal Medicine

## 2018-01-19 DIAGNOSIS — R0989 Other specified symptoms and signs involving the circulatory and respiratory systems: Secondary | ICD-10-CM | POA: Diagnosis not present

## 2018-01-19 DIAGNOSIS — Z79899 Other long term (current) drug therapy: Secondary | ICD-10-CM | POA: Insufficient documentation

## 2018-01-19 DIAGNOSIS — R05 Cough: Secondary | ICD-10-CM | POA: Diagnosis not present

## 2018-01-19 DIAGNOSIS — J45909 Unspecified asthma, uncomplicated: Secondary | ICD-10-CM | POA: Diagnosis not present

## 2018-01-19 DIAGNOSIS — E440 Moderate protein-calorie malnutrition: Secondary | ICD-10-CM | POA: Diagnosis not present

## 2018-01-19 DIAGNOSIS — R509 Fever, unspecified: Secondary | ICD-10-CM | POA: Diagnosis not present

## 2018-01-19 DIAGNOSIS — M6281 Muscle weakness (generalized): Secondary | ICD-10-CM | POA: Diagnosis not present

## 2018-01-19 DIAGNOSIS — J189 Pneumonia, unspecified organism: Secondary | ICD-10-CM | POA: Diagnosis not present

## 2018-01-19 LAB — CBC WITH DIFFERENTIAL/PLATELET
Basophils Absolute: 0.1 10*3/uL (ref 0–0.1)
Basophils Relative: 0 %
EOS ABS: 0.1 10*3/uL (ref 0–0.7)
Eosinophils Relative: 0 %
HCT: 44.7 % (ref 35.0–47.0)
Hemoglobin: 14.4 g/dL (ref 12.0–16.0)
Lymphocytes Relative: 7 %
Lymphs Abs: 1.1 10*3/uL (ref 1.0–3.6)
MCH: 27.7 pg (ref 26.0–34.0)
MCHC: 32.2 g/dL (ref 32.0–36.0)
MCV: 86.2 fL (ref 80.0–100.0)
Monocytes Absolute: 0.8 10*3/uL (ref 0.2–0.9)
Monocytes Relative: 5 %
NEUTROS PCT: 88 %
Neutro Abs: 14.5 10*3/uL — ABNORMAL HIGH (ref 1.4–6.5)
Platelets: 214 10*3/uL (ref 150–440)
RBC: 5.19 MIL/uL (ref 3.80–5.20)
RDW: 15.4 % — ABNORMAL HIGH (ref 11.5–14.5)
WBC: 16.6 10*3/uL — ABNORMAL HIGH (ref 3.6–11.0)

## 2018-01-21 DIAGNOSIS — J189 Pneumonia, unspecified organism: Secondary | ICD-10-CM | POA: Diagnosis not present

## 2018-01-21 DIAGNOSIS — E440 Moderate protein-calorie malnutrition: Secondary | ICD-10-CM | POA: Diagnosis not present

## 2018-01-21 DIAGNOSIS — E860 Dehydration: Secondary | ICD-10-CM | POA: Diagnosis not present

## 2018-01-21 DIAGNOSIS — J45909 Unspecified asthma, uncomplicated: Secondary | ICD-10-CM | POA: Diagnosis not present

## 2018-01-24 DIAGNOSIS — J189 Pneumonia, unspecified organism: Secondary | ICD-10-CM | POA: Diagnosis not present

## 2018-01-24 DIAGNOSIS — F339 Major depressive disorder, recurrent, unspecified: Secondary | ICD-10-CM | POA: Diagnosis not present

## 2018-01-24 DIAGNOSIS — E860 Dehydration: Secondary | ICD-10-CM | POA: Diagnosis not present

## 2018-01-24 DIAGNOSIS — J45909 Unspecified asthma, uncomplicated: Secondary | ICD-10-CM | POA: Diagnosis not present

## 2018-01-25 DIAGNOSIS — J189 Pneumonia, unspecified organism: Secondary | ICD-10-CM | POA: Diagnosis not present

## 2018-01-25 DIAGNOSIS — E860 Dehydration: Secondary | ICD-10-CM | POA: Diagnosis not present

## 2018-01-25 DIAGNOSIS — E440 Moderate protein-calorie malnutrition: Secondary | ICD-10-CM | POA: Diagnosis not present

## 2018-01-25 DIAGNOSIS — J45909 Unspecified asthma, uncomplicated: Secondary | ICD-10-CM | POA: Diagnosis not present

## 2018-02-01 DIAGNOSIS — G4700 Insomnia, unspecified: Secondary | ICD-10-CM | POA: Diagnosis not present

## 2018-02-01 DIAGNOSIS — G309 Alzheimer's disease, unspecified: Secondary | ICD-10-CM | POA: Diagnosis not present

## 2018-02-01 DIAGNOSIS — E860 Dehydration: Secondary | ICD-10-CM | POA: Diagnosis not present

## 2018-02-01 DIAGNOSIS — J45909 Unspecified asthma, uncomplicated: Secondary | ICD-10-CM | POA: Diagnosis not present

## 2018-02-06 DIAGNOSIS — F0280 Dementia in other diseases classified elsewhere without behavioral disturbance: Secondary | ICD-10-CM | POA: Diagnosis not present

## 2018-02-06 DIAGNOSIS — G301 Alzheimer's disease with late onset: Secondary | ICD-10-CM | POA: Diagnosis not present

## 2018-02-06 DIAGNOSIS — F330 Major depressive disorder, recurrent, mild: Secondary | ICD-10-CM | POA: Diagnosis not present

## 2018-02-09 DIAGNOSIS — J4521 Mild intermittent asthma with (acute) exacerbation: Secondary | ICD-10-CM | POA: Diagnosis not present

## 2018-02-09 DIAGNOSIS — F0280 Dementia in other diseases classified elsewhere without behavioral disturbance: Secondary | ICD-10-CM | POA: Diagnosis not present

## 2018-02-09 DIAGNOSIS — M545 Low back pain: Secondary | ICD-10-CM | POA: Diagnosis not present

## 2018-02-09 DIAGNOSIS — F3289 Other specified depressive episodes: Secondary | ICD-10-CM | POA: Diagnosis not present

## 2018-02-13 DIAGNOSIS — I509 Heart failure, unspecified: Secondary | ICD-10-CM | POA: Diagnosis not present

## 2018-02-13 DIAGNOSIS — M810 Age-related osteoporosis without current pathological fracture: Secondary | ICD-10-CM | POA: Diagnosis not present

## 2018-02-13 DIAGNOSIS — F419 Anxiety disorder, unspecified: Secondary | ICD-10-CM | POA: Diagnosis not present

## 2018-02-13 DIAGNOSIS — M1991 Primary osteoarthritis, unspecified site: Secondary | ICD-10-CM | POA: Diagnosis not present

## 2018-02-13 DIAGNOSIS — G308 Other Alzheimer's disease: Secondary | ICD-10-CM | POA: Diagnosis not present

## 2018-02-13 DIAGNOSIS — F0280 Dementia in other diseases classified elsewhere without behavioral disturbance: Secondary | ICD-10-CM | POA: Diagnosis not present

## 2018-02-13 DIAGNOSIS — Z7951 Long term (current) use of inhaled steroids: Secondary | ICD-10-CM | POA: Diagnosis not present

## 2018-02-13 DIAGNOSIS — J440 Chronic obstructive pulmonary disease with acute lower respiratory infection: Secondary | ICD-10-CM | POA: Diagnosis not present

## 2018-02-13 DIAGNOSIS — J441 Chronic obstructive pulmonary disease with (acute) exacerbation: Secondary | ICD-10-CM | POA: Diagnosis not present

## 2018-02-13 DIAGNOSIS — Z79899 Other long term (current) drug therapy: Secondary | ICD-10-CM | POA: Diagnosis not present

## 2018-02-13 DIAGNOSIS — F339 Major depressive disorder, recurrent, unspecified: Secondary | ICD-10-CM | POA: Diagnosis not present

## 2018-02-13 DIAGNOSIS — J168 Pneumonia due to other specified infectious organisms: Secondary | ICD-10-CM | POA: Diagnosis not present

## 2018-02-13 DIAGNOSIS — G309 Alzheimer's disease, unspecified: Secondary | ICD-10-CM | POA: Diagnosis not present

## 2018-02-13 DIAGNOSIS — Z87440 Personal history of urinary (tract) infections: Secondary | ICD-10-CM | POA: Diagnosis not present

## 2018-02-13 DIAGNOSIS — E440 Moderate protein-calorie malnutrition: Secondary | ICD-10-CM | POA: Diagnosis not present

## 2018-02-13 DIAGNOSIS — J189 Pneumonia, unspecified organism: Secondary | ICD-10-CM | POA: Diagnosis not present

## 2018-02-13 DIAGNOSIS — R1310 Dysphagia, unspecified: Secondary | ICD-10-CM | POA: Diagnosis not present

## 2018-02-17 DIAGNOSIS — M810 Age-related osteoporosis without current pathological fracture: Secondary | ICD-10-CM | POA: Diagnosis not present

## 2018-02-17 DIAGNOSIS — J440 Chronic obstructive pulmonary disease with acute lower respiratory infection: Secondary | ICD-10-CM | POA: Diagnosis not present

## 2018-02-17 DIAGNOSIS — G309 Alzheimer's disease, unspecified: Secondary | ICD-10-CM | POA: Diagnosis not present

## 2018-02-17 DIAGNOSIS — J189 Pneumonia, unspecified organism: Secondary | ICD-10-CM | POA: Diagnosis not present

## 2018-02-17 DIAGNOSIS — F0280 Dementia in other diseases classified elsewhere without behavioral disturbance: Secondary | ICD-10-CM | POA: Diagnosis not present

## 2018-02-17 DIAGNOSIS — I509 Heart failure, unspecified: Secondary | ICD-10-CM | POA: Diagnosis not present

## 2018-02-21 DIAGNOSIS — M810 Age-related osteoporosis without current pathological fracture: Secondary | ICD-10-CM | POA: Diagnosis not present

## 2018-02-21 DIAGNOSIS — F0280 Dementia in other diseases classified elsewhere without behavioral disturbance: Secondary | ICD-10-CM | POA: Diagnosis not present

## 2018-02-21 DIAGNOSIS — J189 Pneumonia, unspecified organism: Secondary | ICD-10-CM | POA: Diagnosis not present

## 2018-02-21 DIAGNOSIS — G309 Alzheimer's disease, unspecified: Secondary | ICD-10-CM | POA: Diagnosis not present

## 2018-02-21 DIAGNOSIS — I509 Heart failure, unspecified: Secondary | ICD-10-CM | POA: Diagnosis not present

## 2018-02-21 DIAGNOSIS — J440 Chronic obstructive pulmonary disease with acute lower respiratory infection: Secondary | ICD-10-CM | POA: Diagnosis not present

## 2018-02-22 DIAGNOSIS — F0280 Dementia in other diseases classified elsewhere without behavioral disturbance: Secondary | ICD-10-CM | POA: Diagnosis not present

## 2018-02-22 DIAGNOSIS — I509 Heart failure, unspecified: Secondary | ICD-10-CM | POA: Diagnosis not present

## 2018-02-22 DIAGNOSIS — G309 Alzheimer's disease, unspecified: Secondary | ICD-10-CM | POA: Diagnosis not present

## 2018-02-22 DIAGNOSIS — M810 Age-related osteoporosis without current pathological fracture: Secondary | ICD-10-CM | POA: Diagnosis not present

## 2018-02-22 DIAGNOSIS — J440 Chronic obstructive pulmonary disease with acute lower respiratory infection: Secondary | ICD-10-CM | POA: Diagnosis not present

## 2018-02-22 DIAGNOSIS — J189 Pneumonia, unspecified organism: Secondary | ICD-10-CM | POA: Diagnosis not present

## 2018-02-23 DIAGNOSIS — M810 Age-related osteoporosis without current pathological fracture: Secondary | ICD-10-CM | POA: Diagnosis not present

## 2018-02-23 DIAGNOSIS — J189 Pneumonia, unspecified organism: Secondary | ICD-10-CM | POA: Diagnosis not present

## 2018-02-23 DIAGNOSIS — G309 Alzheimer's disease, unspecified: Secondary | ICD-10-CM | POA: Diagnosis not present

## 2018-02-23 DIAGNOSIS — I509 Heart failure, unspecified: Secondary | ICD-10-CM | POA: Diagnosis not present

## 2018-02-23 DIAGNOSIS — J440 Chronic obstructive pulmonary disease with acute lower respiratory infection: Secondary | ICD-10-CM | POA: Diagnosis not present

## 2018-02-23 DIAGNOSIS — F0280 Dementia in other diseases classified elsewhere without behavioral disturbance: Secondary | ICD-10-CM | POA: Diagnosis not present

## 2018-02-24 DIAGNOSIS — G309 Alzheimer's disease, unspecified: Secondary | ICD-10-CM | POA: Diagnosis not present

## 2018-02-24 DIAGNOSIS — J440 Chronic obstructive pulmonary disease with acute lower respiratory infection: Secondary | ICD-10-CM | POA: Diagnosis not present

## 2018-02-24 DIAGNOSIS — M810 Age-related osteoporosis without current pathological fracture: Secondary | ICD-10-CM | POA: Diagnosis not present

## 2018-02-24 DIAGNOSIS — J189 Pneumonia, unspecified organism: Secondary | ICD-10-CM | POA: Diagnosis not present

## 2018-02-24 DIAGNOSIS — F0280 Dementia in other diseases classified elsewhere without behavioral disturbance: Secondary | ICD-10-CM | POA: Diagnosis not present

## 2018-02-24 DIAGNOSIS — I509 Heart failure, unspecified: Secondary | ICD-10-CM | POA: Diagnosis not present

## 2018-02-27 DIAGNOSIS — G309 Alzheimer's disease, unspecified: Secondary | ICD-10-CM | POA: Diagnosis not present

## 2018-02-27 DIAGNOSIS — M810 Age-related osteoporosis without current pathological fracture: Secondary | ICD-10-CM | POA: Diagnosis not present

## 2018-02-27 DIAGNOSIS — F0280 Dementia in other diseases classified elsewhere without behavioral disturbance: Secondary | ICD-10-CM | POA: Diagnosis not present

## 2018-02-27 DIAGNOSIS — J189 Pneumonia, unspecified organism: Secondary | ICD-10-CM | POA: Diagnosis not present

## 2018-02-27 DIAGNOSIS — J440 Chronic obstructive pulmonary disease with acute lower respiratory infection: Secondary | ICD-10-CM | POA: Diagnosis not present

## 2018-02-27 DIAGNOSIS — I509 Heart failure, unspecified: Secondary | ICD-10-CM | POA: Diagnosis not present

## 2018-03-01 DIAGNOSIS — F0280 Dementia in other diseases classified elsewhere without behavioral disturbance: Secondary | ICD-10-CM | POA: Diagnosis not present

## 2018-03-01 DIAGNOSIS — M810 Age-related osteoporosis without current pathological fracture: Secondary | ICD-10-CM | POA: Diagnosis not present

## 2018-03-01 DIAGNOSIS — J440 Chronic obstructive pulmonary disease with acute lower respiratory infection: Secondary | ICD-10-CM | POA: Diagnosis not present

## 2018-03-01 DIAGNOSIS — G309 Alzheimer's disease, unspecified: Secondary | ICD-10-CM | POA: Diagnosis not present

## 2018-03-01 DIAGNOSIS — I509 Heart failure, unspecified: Secondary | ICD-10-CM | POA: Diagnosis not present

## 2018-03-01 DIAGNOSIS — J189 Pneumonia, unspecified organism: Secondary | ICD-10-CM | POA: Diagnosis not present

## 2018-03-07 DIAGNOSIS — G309 Alzheimer's disease, unspecified: Secondary | ICD-10-CM | POA: Diagnosis not present

## 2018-03-07 DIAGNOSIS — M810 Age-related osteoporosis without current pathological fracture: Secondary | ICD-10-CM | POA: Diagnosis not present

## 2018-03-07 DIAGNOSIS — F0280 Dementia in other diseases classified elsewhere without behavioral disturbance: Secondary | ICD-10-CM | POA: Diagnosis not present

## 2018-03-07 DIAGNOSIS — J440 Chronic obstructive pulmonary disease with acute lower respiratory infection: Secondary | ICD-10-CM | POA: Diagnosis not present

## 2018-03-07 DIAGNOSIS — I509 Heart failure, unspecified: Secondary | ICD-10-CM | POA: Diagnosis not present

## 2018-03-07 DIAGNOSIS — J189 Pneumonia, unspecified organism: Secondary | ICD-10-CM | POA: Diagnosis not present

## 2018-03-13 DIAGNOSIS — N39498 Other specified urinary incontinence: Secondary | ICD-10-CM | POA: Diagnosis not present

## 2018-03-13 DIAGNOSIS — I872 Venous insufficiency (chronic) (peripheral): Secondary | ICD-10-CM | POA: Diagnosis not present

## 2018-03-15 DIAGNOSIS — J440 Chronic obstructive pulmonary disease with acute lower respiratory infection: Secondary | ICD-10-CM | POA: Diagnosis not present

## 2018-03-15 DIAGNOSIS — J189 Pneumonia, unspecified organism: Secondary | ICD-10-CM | POA: Diagnosis not present

## 2018-03-15 DIAGNOSIS — F0280 Dementia in other diseases classified elsewhere without behavioral disturbance: Secondary | ICD-10-CM | POA: Diagnosis not present

## 2018-03-15 DIAGNOSIS — M810 Age-related osteoporosis without current pathological fracture: Secondary | ICD-10-CM | POA: Diagnosis not present

## 2018-03-15 DIAGNOSIS — I509 Heart failure, unspecified: Secondary | ICD-10-CM | POA: Diagnosis not present

## 2018-03-15 DIAGNOSIS — G309 Alzheimer's disease, unspecified: Secondary | ICD-10-CM | POA: Diagnosis not present

## 2018-03-22 DIAGNOSIS — I509 Heart failure, unspecified: Secondary | ICD-10-CM | POA: Diagnosis not present

## 2018-03-22 DIAGNOSIS — J440 Chronic obstructive pulmonary disease with acute lower respiratory infection: Secondary | ICD-10-CM | POA: Diagnosis not present

## 2018-03-22 DIAGNOSIS — G309 Alzheimer's disease, unspecified: Secondary | ICD-10-CM | POA: Diagnosis not present

## 2018-03-22 DIAGNOSIS — J189 Pneumonia, unspecified organism: Secondary | ICD-10-CM | POA: Diagnosis not present

## 2018-03-22 DIAGNOSIS — F0280 Dementia in other diseases classified elsewhere without behavioral disturbance: Secondary | ICD-10-CM | POA: Diagnosis not present

## 2018-03-22 DIAGNOSIS — M810 Age-related osteoporosis without current pathological fracture: Secondary | ICD-10-CM | POA: Diagnosis not present

## 2018-03-29 DIAGNOSIS — M810 Age-related osteoporosis without current pathological fracture: Secondary | ICD-10-CM | POA: Diagnosis not present

## 2018-03-29 DIAGNOSIS — F0280 Dementia in other diseases classified elsewhere without behavioral disturbance: Secondary | ICD-10-CM | POA: Diagnosis not present

## 2018-03-29 DIAGNOSIS — I509 Heart failure, unspecified: Secondary | ICD-10-CM | POA: Diagnosis not present

## 2018-03-29 DIAGNOSIS — J440 Chronic obstructive pulmonary disease with acute lower respiratory infection: Secondary | ICD-10-CM | POA: Diagnosis not present

## 2018-03-29 DIAGNOSIS — G309 Alzheimer's disease, unspecified: Secondary | ICD-10-CM | POA: Diagnosis not present

## 2018-03-29 DIAGNOSIS — J189 Pneumonia, unspecified organism: Secondary | ICD-10-CM | POA: Diagnosis not present

## 2018-04-08 DIAGNOSIS — J440 Chronic obstructive pulmonary disease with acute lower respiratory infection: Secondary | ICD-10-CM | POA: Diagnosis not present

## 2018-04-08 DIAGNOSIS — J189 Pneumonia, unspecified organism: Secondary | ICD-10-CM | POA: Diagnosis not present

## 2018-04-08 DIAGNOSIS — F0280 Dementia in other diseases classified elsewhere without behavioral disturbance: Secondary | ICD-10-CM | POA: Diagnosis not present

## 2018-04-08 DIAGNOSIS — G309 Alzheimer's disease, unspecified: Secondary | ICD-10-CM | POA: Diagnosis not present

## 2018-04-08 DIAGNOSIS — M810 Age-related osteoporosis without current pathological fracture: Secondary | ICD-10-CM | POA: Diagnosis not present

## 2018-04-08 DIAGNOSIS — I509 Heart failure, unspecified: Secondary | ICD-10-CM | POA: Diagnosis not present

## 2018-05-10 DIAGNOSIS — J449 Chronic obstructive pulmonary disease, unspecified: Secondary | ICD-10-CM | POA: Diagnosis not present

## 2018-05-10 DIAGNOSIS — R531 Weakness: Secondary | ICD-10-CM | POA: Diagnosis not present

## 2018-05-10 DIAGNOSIS — M810 Age-related osteoporosis without current pathological fracture: Secondary | ICD-10-CM | POA: Diagnosis not present

## 2018-05-10 DIAGNOSIS — G309 Alzheimer's disease, unspecified: Secondary | ICD-10-CM | POA: Diagnosis not present

## 2018-05-11 DIAGNOSIS — N3000 Acute cystitis without hematuria: Secondary | ICD-10-CM | POA: Diagnosis not present

## 2018-05-15 DIAGNOSIS — R32 Unspecified urinary incontinence: Secondary | ICD-10-CM | POA: Diagnosis not present

## 2018-05-15 DIAGNOSIS — E860 Dehydration: Secondary | ICD-10-CM | POA: Diagnosis not present

## 2018-05-15 DIAGNOSIS — F329 Major depressive disorder, single episode, unspecified: Secondary | ICD-10-CM | POA: Diagnosis not present

## 2018-05-15 DIAGNOSIS — J45909 Unspecified asthma, uncomplicated: Secondary | ICD-10-CM | POA: Diagnosis not present

## 2018-05-15 DIAGNOSIS — G309 Alzheimer's disease, unspecified: Secondary | ICD-10-CM | POA: Diagnosis not present

## 2018-05-15 DIAGNOSIS — G9340 Encephalopathy, unspecified: Secondary | ICD-10-CM | POA: Diagnosis not present

## 2018-05-15 DIAGNOSIS — M6281 Muscle weakness (generalized): Secondary | ICD-10-CM | POA: Diagnosis not present

## 2018-05-15 DIAGNOSIS — R296 Repeated falls: Secondary | ICD-10-CM | POA: Diagnosis not present

## 2018-05-15 DIAGNOSIS — S32501D Unspecified fracture of right pubis, subsequent encounter for fracture with routine healing: Secondary | ICD-10-CM | POA: Diagnosis not present

## 2018-05-15 DIAGNOSIS — Z23 Encounter for immunization: Secondary | ICD-10-CM | POA: Diagnosis not present

## 2018-05-15 DIAGNOSIS — F0280 Dementia in other diseases classified elsewhere without behavioral disturbance: Secondary | ICD-10-CM | POA: Diagnosis not present

## 2018-05-15 DIAGNOSIS — R4182 Altered mental status, unspecified: Secondary | ICD-10-CM | POA: Diagnosis not present

## 2018-05-15 DIAGNOSIS — I959 Hypotension, unspecified: Secondary | ICD-10-CM | POA: Diagnosis not present

## 2018-05-15 DIAGNOSIS — R05 Cough: Secondary | ICD-10-CM | POA: Diagnosis not present

## 2018-05-15 DIAGNOSIS — F339 Major depressive disorder, recurrent, unspecified: Secondary | ICD-10-CM | POA: Diagnosis not present

## 2018-05-15 DIAGNOSIS — S299XXA Unspecified injury of thorax, initial encounter: Secondary | ICD-10-CM | POA: Diagnosis not present

## 2018-05-15 DIAGNOSIS — J690 Pneumonitis due to inhalation of food and vomit: Secondary | ICD-10-CM | POA: Diagnosis not present

## 2018-05-15 DIAGNOSIS — Z7401 Bed confinement status: Secondary | ICD-10-CM | POA: Diagnosis not present

## 2018-05-15 DIAGNOSIS — J449 Chronic obstructive pulmonary disease, unspecified: Secondary | ICD-10-CM | POA: Diagnosis not present

## 2018-05-15 DIAGNOSIS — R269 Unspecified abnormalities of gait and mobility: Secondary | ICD-10-CM | POA: Diagnosis not present

## 2018-05-15 DIAGNOSIS — R2681 Unsteadiness on feet: Secondary | ICD-10-CM | POA: Diagnosis not present

## 2018-05-15 DIAGNOSIS — R410 Disorientation, unspecified: Secondary | ICD-10-CM | POA: Diagnosis not present

## 2018-05-15 DIAGNOSIS — N390 Urinary tract infection, site not specified: Secondary | ICD-10-CM | POA: Diagnosis not present

## 2018-05-16 DIAGNOSIS — F329 Major depressive disorder, single episode, unspecified: Secondary | ICD-10-CM | POA: Diagnosis not present

## 2018-05-16 DIAGNOSIS — J45909 Unspecified asthma, uncomplicated: Secondary | ICD-10-CM | POA: Diagnosis not present

## 2018-05-16 DIAGNOSIS — G9340 Encephalopathy, unspecified: Secondary | ICD-10-CM | POA: Diagnosis not present

## 2018-05-16 DIAGNOSIS — Z23 Encounter for immunization: Secondary | ICD-10-CM | POA: Diagnosis not present

## 2018-05-16 DIAGNOSIS — F0280 Dementia in other diseases classified elsewhere without behavioral disturbance: Secondary | ICD-10-CM | POA: Diagnosis not present

## 2018-05-16 DIAGNOSIS — E860 Dehydration: Secondary | ICD-10-CM | POA: Diagnosis not present

## 2018-05-16 DIAGNOSIS — J690 Pneumonitis due to inhalation of food and vomit: Secondary | ICD-10-CM | POA: Diagnosis not present

## 2018-05-16 DIAGNOSIS — G309 Alzheimer's disease, unspecified: Secondary | ICD-10-CM | POA: Diagnosis not present

## 2018-05-16 DIAGNOSIS — N390 Urinary tract infection, site not specified: Secondary | ICD-10-CM | POA: Diagnosis not present

## 2018-05-16 DIAGNOSIS — R32 Unspecified urinary incontinence: Secondary | ICD-10-CM | POA: Diagnosis not present

## 2018-05-17 DIAGNOSIS — R32 Unspecified urinary incontinence: Secondary | ICD-10-CM | POA: Diagnosis not present

## 2018-05-17 DIAGNOSIS — F329 Major depressive disorder, single episode, unspecified: Secondary | ICD-10-CM | POA: Diagnosis not present

## 2018-05-17 DIAGNOSIS — J690 Pneumonitis due to inhalation of food and vomit: Secondary | ICD-10-CM | POA: Diagnosis not present

## 2018-05-17 DIAGNOSIS — J45909 Unspecified asthma, uncomplicated: Secondary | ICD-10-CM | POA: Diagnosis not present

## 2018-05-17 DIAGNOSIS — F0280 Dementia in other diseases classified elsewhere without behavioral disturbance: Secondary | ICD-10-CM | POA: Diagnosis not present

## 2018-05-17 DIAGNOSIS — E860 Dehydration: Secondary | ICD-10-CM | POA: Diagnosis not present

## 2018-05-17 DIAGNOSIS — G309 Alzheimer's disease, unspecified: Secondary | ICD-10-CM | POA: Diagnosis not present

## 2018-05-17 DIAGNOSIS — N390 Urinary tract infection, site not specified: Secondary | ICD-10-CM | POA: Diagnosis not present

## 2018-05-17 DIAGNOSIS — Z23 Encounter for immunization: Secondary | ICD-10-CM | POA: Diagnosis not present

## 2018-05-18 DIAGNOSIS — N390 Urinary tract infection, site not specified: Secondary | ICD-10-CM | POA: Diagnosis not present

## 2018-05-18 DIAGNOSIS — F329 Major depressive disorder, single episode, unspecified: Secondary | ICD-10-CM | POA: Diagnosis not present

## 2018-05-18 DIAGNOSIS — R32 Unspecified urinary incontinence: Secondary | ICD-10-CM | POA: Diagnosis not present

## 2018-05-19 DIAGNOSIS — F329 Major depressive disorder, single episode, unspecified: Secondary | ICD-10-CM | POA: Diagnosis not present

## 2018-05-19 DIAGNOSIS — N390 Urinary tract infection, site not specified: Secondary | ICD-10-CM | POA: Diagnosis not present

## 2018-05-19 DIAGNOSIS — R32 Unspecified urinary incontinence: Secondary | ICD-10-CM | POA: Diagnosis not present

## 2018-06-01 DIAGNOSIS — N390 Urinary tract infection, site not specified: Secondary | ICD-10-CM | POA: Diagnosis not present

## 2018-06-01 DIAGNOSIS — R32 Unspecified urinary incontinence: Secondary | ICD-10-CM | POA: Diagnosis not present

## 2018-06-01 DIAGNOSIS — F329 Major depressive disorder, single episode, unspecified: Secondary | ICD-10-CM | POA: Diagnosis not present

## 2018-06-07 DIAGNOSIS — F329 Major depressive disorder, single episode, unspecified: Secondary | ICD-10-CM | POA: Diagnosis not present

## 2018-06-07 DIAGNOSIS — R32 Unspecified urinary incontinence: Secondary | ICD-10-CM | POA: Diagnosis not present

## 2018-06-07 DIAGNOSIS — N390 Urinary tract infection, site not specified: Secondary | ICD-10-CM | POA: Diagnosis not present

## 2018-06-08 DIAGNOSIS — J45909 Unspecified asthma, uncomplicated: Secondary | ICD-10-CM | POA: Diagnosis not present

## 2018-06-08 DIAGNOSIS — G4701 Insomnia due to medical condition: Secondary | ICD-10-CM | POA: Diagnosis not present

## 2018-06-08 DIAGNOSIS — R4182 Altered mental status, unspecified: Secondary | ICD-10-CM | POA: Diagnosis not present

## 2018-06-08 DIAGNOSIS — Z7401 Bed confinement status: Secondary | ICD-10-CM | POA: Diagnosis not present

## 2018-06-08 DIAGNOSIS — R269 Unspecified abnormalities of gait and mobility: Secondary | ICD-10-CM | POA: Diagnosis not present

## 2018-06-08 DIAGNOSIS — F330 Major depressive disorder, recurrent, mild: Secondary | ICD-10-CM | POA: Diagnosis not present

## 2018-06-08 DIAGNOSIS — S32501D Unspecified fracture of right pubis, subsequent encounter for fracture with routine healing: Secondary | ICD-10-CM | POA: Diagnosis not present

## 2018-06-08 DIAGNOSIS — R2681 Unsteadiness on feet: Secondary | ICD-10-CM | POA: Diagnosis not present

## 2018-06-08 DIAGNOSIS — F419 Anxiety disorder, unspecified: Secondary | ICD-10-CM | POA: Diagnosis not present

## 2018-06-08 DIAGNOSIS — J449 Chronic obstructive pulmonary disease, unspecified: Secondary | ICD-10-CM | POA: Diagnosis not present

## 2018-06-08 DIAGNOSIS — N390 Urinary tract infection, site not specified: Secondary | ICD-10-CM | POA: Diagnosis not present

## 2018-06-08 DIAGNOSIS — K5900 Constipation, unspecified: Secondary | ICD-10-CM | POA: Diagnosis not present

## 2018-06-08 DIAGNOSIS — G309 Alzheimer's disease, unspecified: Secondary | ICD-10-CM | POA: Diagnosis not present

## 2018-06-08 DIAGNOSIS — M810 Age-related osteoporosis without current pathological fracture: Secondary | ICD-10-CM | POA: Diagnosis not present

## 2018-06-08 DIAGNOSIS — R05 Cough: Secondary | ICD-10-CM | POA: Diagnosis not present

## 2018-06-08 DIAGNOSIS — G9340 Encephalopathy, unspecified: Secondary | ICD-10-CM | POA: Diagnosis not present

## 2018-06-08 DIAGNOSIS — R32 Unspecified urinary incontinence: Secondary | ICD-10-CM | POA: Diagnosis not present

## 2018-06-08 DIAGNOSIS — F0390 Unspecified dementia without behavioral disturbance: Secondary | ICD-10-CM | POA: Diagnosis not present

## 2018-06-08 DIAGNOSIS — F339 Major depressive disorder, recurrent, unspecified: Secondary | ICD-10-CM | POA: Diagnosis not present

## 2018-06-08 DIAGNOSIS — F329 Major depressive disorder, single episode, unspecified: Secondary | ICD-10-CM | POA: Diagnosis not present

## 2018-06-08 DIAGNOSIS — Z23 Encounter for immunization: Secondary | ICD-10-CM | POA: Diagnosis not present

## 2018-06-08 DIAGNOSIS — F0280 Dementia in other diseases classified elsewhere without behavioral disturbance: Secondary | ICD-10-CM | POA: Diagnosis not present

## 2018-06-08 DIAGNOSIS — F3341 Major depressive disorder, recurrent, in partial remission: Secondary | ICD-10-CM | POA: Diagnosis not present

## 2018-06-08 DIAGNOSIS — J690 Pneumonitis due to inhalation of food and vomit: Secondary | ICD-10-CM | POA: Diagnosis not present

## 2018-06-08 DIAGNOSIS — G301 Alzheimer's disease with late onset: Secondary | ICD-10-CM | POA: Diagnosis not present

## 2018-06-08 DIAGNOSIS — M6281 Muscle weakness (generalized): Secondary | ICD-10-CM | POA: Diagnosis not present

## 2018-06-08 DIAGNOSIS — Z9181 History of falling: Secondary | ICD-10-CM | POA: Diagnosis not present

## 2018-06-08 DIAGNOSIS — I959 Hypotension, unspecified: Secondary | ICD-10-CM | POA: Diagnosis not present

## 2018-06-08 DIAGNOSIS — E860 Dehydration: Secondary | ICD-10-CM | POA: Diagnosis not present

## 2018-06-08 DIAGNOSIS — F0150 Vascular dementia without behavioral disturbance: Secondary | ICD-10-CM | POA: Diagnosis not present

## 2018-06-08 DIAGNOSIS — R410 Disorientation, unspecified: Secondary | ICD-10-CM | POA: Diagnosis not present

## 2018-06-08 DIAGNOSIS — R531 Weakness: Secondary | ICD-10-CM | POA: Diagnosis not present

## 2018-06-13 DIAGNOSIS — Z9181 History of falling: Secondary | ICD-10-CM | POA: Diagnosis not present

## 2018-06-13 DIAGNOSIS — M6281 Muscle weakness (generalized): Secondary | ICD-10-CM | POA: Diagnosis not present

## 2018-06-13 DIAGNOSIS — R4182 Altered mental status, unspecified: Secondary | ICD-10-CM | POA: Diagnosis not present

## 2018-06-13 DIAGNOSIS — F339 Major depressive disorder, recurrent, unspecified: Secondary | ICD-10-CM | POA: Diagnosis not present

## 2018-06-13 DIAGNOSIS — N390 Urinary tract infection, site not specified: Secondary | ICD-10-CM | POA: Diagnosis not present

## 2018-06-13 DIAGNOSIS — F419 Anxiety disorder, unspecified: Secondary | ICD-10-CM | POA: Diagnosis not present

## 2018-06-13 DIAGNOSIS — K5900 Constipation, unspecified: Secondary | ICD-10-CM | POA: Diagnosis not present

## 2018-06-13 DIAGNOSIS — F0150 Vascular dementia without behavioral disturbance: Secondary | ICD-10-CM | POA: Diagnosis not present

## 2018-06-19 DIAGNOSIS — K5900 Constipation, unspecified: Secondary | ICD-10-CM | POA: Diagnosis not present

## 2018-06-19 DIAGNOSIS — F339 Major depressive disorder, recurrent, unspecified: Secondary | ICD-10-CM | POA: Diagnosis not present

## 2018-06-19 DIAGNOSIS — N390 Urinary tract infection, site not specified: Secondary | ICD-10-CM | POA: Diagnosis not present

## 2018-06-19 DIAGNOSIS — Z9181 History of falling: Secondary | ICD-10-CM | POA: Diagnosis not present

## 2018-06-19 DIAGNOSIS — F419 Anxiety disorder, unspecified: Secondary | ICD-10-CM | POA: Diagnosis not present

## 2018-06-19 DIAGNOSIS — R4182 Altered mental status, unspecified: Secondary | ICD-10-CM | POA: Diagnosis not present

## 2018-06-19 DIAGNOSIS — M6281 Muscle weakness (generalized): Secondary | ICD-10-CM | POA: Diagnosis not present

## 2018-06-19 DIAGNOSIS — F0150 Vascular dementia without behavioral disturbance: Secondary | ICD-10-CM | POA: Diagnosis not present

## 2018-06-22 DIAGNOSIS — M6281 Muscle weakness (generalized): Secondary | ICD-10-CM | POA: Diagnosis not present

## 2018-06-22 DIAGNOSIS — F339 Major depressive disorder, recurrent, unspecified: Secondary | ICD-10-CM | POA: Diagnosis not present

## 2018-06-22 DIAGNOSIS — R05 Cough: Secondary | ICD-10-CM | POA: Diagnosis not present

## 2018-06-22 DIAGNOSIS — F0150 Vascular dementia without behavioral disturbance: Secondary | ICD-10-CM | POA: Diagnosis not present

## 2018-06-22 DIAGNOSIS — R4182 Altered mental status, unspecified: Secondary | ICD-10-CM | POA: Diagnosis not present

## 2018-06-22 DIAGNOSIS — Z9181 History of falling: Secondary | ICD-10-CM | POA: Diagnosis not present

## 2018-06-22 DIAGNOSIS — N390 Urinary tract infection, site not specified: Secondary | ICD-10-CM | POA: Diagnosis not present

## 2018-06-22 DIAGNOSIS — K5900 Constipation, unspecified: Secondary | ICD-10-CM | POA: Diagnosis not present

## 2018-06-22 DIAGNOSIS — F419 Anxiety disorder, unspecified: Secondary | ICD-10-CM | POA: Diagnosis not present

## 2018-07-03 DIAGNOSIS — K5900 Constipation, unspecified: Secondary | ICD-10-CM | POA: Diagnosis not present

## 2018-07-03 DIAGNOSIS — F0150 Vascular dementia without behavioral disturbance: Secondary | ICD-10-CM | POA: Diagnosis not present

## 2018-07-03 DIAGNOSIS — F419 Anxiety disorder, unspecified: Secondary | ICD-10-CM | POA: Diagnosis not present

## 2018-07-03 DIAGNOSIS — Z9181 History of falling: Secondary | ICD-10-CM | POA: Diagnosis not present

## 2018-07-03 DIAGNOSIS — R4182 Altered mental status, unspecified: Secondary | ICD-10-CM | POA: Diagnosis not present

## 2018-07-03 DIAGNOSIS — R05 Cough: Secondary | ICD-10-CM | POA: Diagnosis not present

## 2018-07-03 DIAGNOSIS — F339 Major depressive disorder, recurrent, unspecified: Secondary | ICD-10-CM | POA: Diagnosis not present

## 2018-07-03 DIAGNOSIS — N390 Urinary tract infection, site not specified: Secondary | ICD-10-CM | POA: Diagnosis not present

## 2018-07-03 DIAGNOSIS — M6281 Muscle weakness (generalized): Secondary | ICD-10-CM | POA: Diagnosis not present

## 2018-07-04 DIAGNOSIS — G301 Alzheimer's disease with late onset: Secondary | ICD-10-CM | POA: Diagnosis not present

## 2018-07-04 DIAGNOSIS — F0280 Dementia in other diseases classified elsewhere without behavioral disturbance: Secondary | ICD-10-CM | POA: Diagnosis not present

## 2018-07-04 DIAGNOSIS — F330 Major depressive disorder, recurrent, mild: Secondary | ICD-10-CM | POA: Diagnosis not present

## 2018-07-04 DIAGNOSIS — G4701 Insomnia due to medical condition: Secondary | ICD-10-CM | POA: Diagnosis not present

## 2018-07-11 DIAGNOSIS — J45909 Unspecified asthma, uncomplicated: Secondary | ICD-10-CM | POA: Diagnosis not present

## 2018-07-11 DIAGNOSIS — F419 Anxiety disorder, unspecified: Secondary | ICD-10-CM | POA: Diagnosis not present

## 2018-07-11 DIAGNOSIS — R2681 Unsteadiness on feet: Secondary | ICD-10-CM | POA: Diagnosis not present

## 2018-07-11 DIAGNOSIS — F339 Major depressive disorder, recurrent, unspecified: Secondary | ICD-10-CM | POA: Diagnosis not present

## 2018-07-11 DIAGNOSIS — R4182 Altered mental status, unspecified: Secondary | ICD-10-CM | POA: Diagnosis not present

## 2018-07-11 DIAGNOSIS — R269 Unspecified abnormalities of gait and mobility: Secondary | ICD-10-CM | POA: Diagnosis not present

## 2018-07-11 DIAGNOSIS — Z23 Encounter for immunization: Secondary | ICD-10-CM | POA: Diagnosis not present

## 2018-07-11 DIAGNOSIS — S32501D Unspecified fracture of right pubis, subsequent encounter for fracture with routine healing: Secondary | ICD-10-CM | POA: Diagnosis not present

## 2018-07-11 DIAGNOSIS — F0150 Vascular dementia without behavioral disturbance: Secondary | ICD-10-CM | POA: Diagnosis not present

## 2018-07-11 DIAGNOSIS — M6281 Muscle weakness (generalized): Secondary | ICD-10-CM | POA: Diagnosis not present

## 2018-07-11 DIAGNOSIS — K5900 Constipation, unspecified: Secondary | ICD-10-CM | POA: Diagnosis not present

## 2018-07-11 DIAGNOSIS — N390 Urinary tract infection, site not specified: Secondary | ICD-10-CM | POA: Diagnosis not present

## 2018-07-11 DIAGNOSIS — Z9181 History of falling: Secondary | ICD-10-CM | POA: Diagnosis not present

## 2018-07-11 DIAGNOSIS — J449 Chronic obstructive pulmonary disease, unspecified: Secondary | ICD-10-CM | POA: Diagnosis not present

## 2018-07-11 DIAGNOSIS — R05 Cough: Secondary | ICD-10-CM | POA: Diagnosis not present

## 2018-07-11 DIAGNOSIS — R32 Unspecified urinary incontinence: Secondary | ICD-10-CM | POA: Diagnosis not present

## 2018-07-11 DIAGNOSIS — F0280 Dementia in other diseases classified elsewhere without behavioral disturbance: Secondary | ICD-10-CM | POA: Diagnosis not present

## 2018-07-13 DIAGNOSIS — S32501D Unspecified fracture of right pubis, subsequent encounter for fracture with routine healing: Secondary | ICD-10-CM | POA: Diagnosis not present

## 2018-07-13 DIAGNOSIS — R269 Unspecified abnormalities of gait and mobility: Secondary | ICD-10-CM | POA: Diagnosis not present

## 2018-07-13 DIAGNOSIS — F339 Major depressive disorder, recurrent, unspecified: Secondary | ICD-10-CM | POA: Diagnosis not present

## 2018-07-13 DIAGNOSIS — J449 Chronic obstructive pulmonary disease, unspecified: Secondary | ICD-10-CM | POA: Diagnosis not present

## 2018-07-13 DIAGNOSIS — R32 Unspecified urinary incontinence: Secondary | ICD-10-CM | POA: Diagnosis not present

## 2018-07-13 DIAGNOSIS — F0280 Dementia in other diseases classified elsewhere without behavioral disturbance: Secondary | ICD-10-CM | POA: Diagnosis not present

## 2018-07-13 DIAGNOSIS — Z23 Encounter for immunization: Secondary | ICD-10-CM | POA: Diagnosis not present

## 2018-07-13 DIAGNOSIS — J45909 Unspecified asthma, uncomplicated: Secondary | ICD-10-CM | POA: Diagnosis not present

## 2018-07-13 DIAGNOSIS — M6281 Muscle weakness (generalized): Secondary | ICD-10-CM | POA: Diagnosis not present

## 2018-07-13 DIAGNOSIS — R2681 Unsteadiness on feet: Secondary | ICD-10-CM | POA: Diagnosis not present

## 2018-07-14 ENCOUNTER — Other Ambulatory Visit: Payer: Self-pay

## 2018-07-14 DIAGNOSIS — R2681 Unsteadiness on feet: Secondary | ICD-10-CM | POA: Diagnosis not present

## 2018-07-14 DIAGNOSIS — R269 Unspecified abnormalities of gait and mobility: Secondary | ICD-10-CM | POA: Diagnosis not present

## 2018-07-14 DIAGNOSIS — F339 Major depressive disorder, recurrent, unspecified: Secondary | ICD-10-CM | POA: Diagnosis not present

## 2018-07-14 DIAGNOSIS — S32501D Unspecified fracture of right pubis, subsequent encounter for fracture with routine healing: Secondary | ICD-10-CM | POA: Diagnosis not present

## 2018-07-14 DIAGNOSIS — R32 Unspecified urinary incontinence: Secondary | ICD-10-CM | POA: Diagnosis not present

## 2018-07-14 DIAGNOSIS — M6281 Muscle weakness (generalized): Secondary | ICD-10-CM | POA: Diagnosis not present

## 2018-07-14 DIAGNOSIS — F0280 Dementia in other diseases classified elsewhere without behavioral disturbance: Secondary | ICD-10-CM | POA: Diagnosis not present

## 2018-07-14 DIAGNOSIS — J45909 Unspecified asthma, uncomplicated: Secondary | ICD-10-CM | POA: Diagnosis not present

## 2018-07-14 DIAGNOSIS — J449 Chronic obstructive pulmonary disease, unspecified: Secondary | ICD-10-CM | POA: Diagnosis not present

## 2018-07-14 DIAGNOSIS — Z23 Encounter for immunization: Secondary | ICD-10-CM | POA: Diagnosis not present

## 2018-07-14 NOTE — Patient Outreach (Signed)
[name]) Care Management  07/14/2018  Laura [name] Feb 18, 1924 [id number]   Spoke with primary caregiver Josephine Cables who states that patient is a permanent resident now at [facility].  She voices no concerns at this time.     Plan: RN CM will close case.  Jone Baseman, RN, MSN [place] Management Care Management Coordinator Direct Line [phone] Cell [phone] Toll Free: [phone]  Fax: [phone]

## 2018-07-16 DIAGNOSIS — Z23 Encounter for immunization: Secondary | ICD-10-CM | POA: Diagnosis not present

## 2018-07-16 DIAGNOSIS — M6281 Muscle weakness (generalized): Secondary | ICD-10-CM | POA: Diagnosis not present

## 2018-07-16 DIAGNOSIS — J449 Chronic obstructive pulmonary disease, unspecified: Secondary | ICD-10-CM | POA: Diagnosis not present

## 2018-07-16 DIAGNOSIS — R269 Unspecified abnormalities of gait and mobility: Secondary | ICD-10-CM | POA: Diagnosis not present

## 2018-07-16 DIAGNOSIS — S32501D Unspecified fracture of right pubis, subsequent encounter for fracture with routine healing: Secondary | ICD-10-CM | POA: Diagnosis not present

## 2018-07-16 DIAGNOSIS — R2681 Unsteadiness on feet: Secondary | ICD-10-CM | POA: Diagnosis not present

## 2018-07-16 DIAGNOSIS — R32 Unspecified urinary incontinence: Secondary | ICD-10-CM | POA: Diagnosis not present

## 2018-07-16 DIAGNOSIS — F339 Major depressive disorder, recurrent, unspecified: Secondary | ICD-10-CM | POA: Diagnosis not present

## 2018-07-16 DIAGNOSIS — J45909 Unspecified asthma, uncomplicated: Secondary | ICD-10-CM | POA: Diagnosis not present

## 2018-07-16 DIAGNOSIS — F0280 Dementia in other diseases classified elsewhere without behavioral disturbance: Secondary | ICD-10-CM | POA: Diagnosis not present

## 2018-07-17 DIAGNOSIS — R269 Unspecified abnormalities of gait and mobility: Secondary | ICD-10-CM | POA: Diagnosis not present

## 2018-07-17 DIAGNOSIS — Z23 Encounter for immunization: Secondary | ICD-10-CM | POA: Diagnosis not present

## 2018-07-17 DIAGNOSIS — R2681 Unsteadiness on feet: Secondary | ICD-10-CM | POA: Diagnosis not present

## 2018-07-17 DIAGNOSIS — F0280 Dementia in other diseases classified elsewhere without behavioral disturbance: Secondary | ICD-10-CM | POA: Diagnosis not present

## 2018-07-17 DIAGNOSIS — J45909 Unspecified asthma, uncomplicated: Secondary | ICD-10-CM | POA: Diagnosis not present

## 2018-07-17 DIAGNOSIS — F339 Major depressive disorder, recurrent, unspecified: Secondary | ICD-10-CM | POA: Diagnosis not present

## 2018-07-17 DIAGNOSIS — J449 Chronic obstructive pulmonary disease, unspecified: Secondary | ICD-10-CM | POA: Diagnosis not present

## 2018-07-17 DIAGNOSIS — R32 Unspecified urinary incontinence: Secondary | ICD-10-CM | POA: Diagnosis not present

## 2018-07-17 DIAGNOSIS — S32501D Unspecified fracture of right pubis, subsequent encounter for fracture with routine healing: Secondary | ICD-10-CM | POA: Diagnosis not present

## 2018-07-17 DIAGNOSIS — M6281 Muscle weakness (generalized): Secondary | ICD-10-CM | POA: Diagnosis not present

## 2018-07-18 DIAGNOSIS — F339 Major depressive disorder, recurrent, unspecified: Secondary | ICD-10-CM | POA: Diagnosis not present

## 2018-07-18 DIAGNOSIS — F0280 Dementia in other diseases classified elsewhere without behavioral disturbance: Secondary | ICD-10-CM | POA: Diagnosis not present

## 2018-07-18 DIAGNOSIS — R32 Unspecified urinary incontinence: Secondary | ICD-10-CM | POA: Diagnosis not present

## 2018-07-18 DIAGNOSIS — J45909 Unspecified asthma, uncomplicated: Secondary | ICD-10-CM | POA: Diagnosis not present

## 2018-07-18 DIAGNOSIS — R2681 Unsteadiness on feet: Secondary | ICD-10-CM | POA: Diagnosis not present

## 2018-07-18 DIAGNOSIS — F330 Major depressive disorder, recurrent, mild: Secondary | ICD-10-CM | POA: Diagnosis not present

## 2018-07-18 DIAGNOSIS — G4701 Insomnia due to medical condition: Secondary | ICD-10-CM | POA: Diagnosis not present

## 2018-07-18 DIAGNOSIS — R269 Unspecified abnormalities of gait and mobility: Secondary | ICD-10-CM | POA: Diagnosis not present

## 2018-07-18 DIAGNOSIS — J449 Chronic obstructive pulmonary disease, unspecified: Secondary | ICD-10-CM | POA: Diagnosis not present

## 2018-07-18 DIAGNOSIS — S32501D Unspecified fracture of right pubis, subsequent encounter for fracture with routine healing: Secondary | ICD-10-CM | POA: Diagnosis not present

## 2018-07-18 DIAGNOSIS — Z23 Encounter for immunization: Secondary | ICD-10-CM | POA: Diagnosis not present

## 2018-07-18 DIAGNOSIS — G301 Alzheimer's disease with late onset: Secondary | ICD-10-CM | POA: Diagnosis not present

## 2018-07-18 DIAGNOSIS — M6281 Muscle weakness (generalized): Secondary | ICD-10-CM | POA: Diagnosis not present

## 2018-07-19 DIAGNOSIS — S32501D Unspecified fracture of right pubis, subsequent encounter for fracture with routine healing: Secondary | ICD-10-CM | POA: Diagnosis not present

## 2018-07-19 DIAGNOSIS — G309 Alzheimer's disease, unspecified: Secondary | ICD-10-CM | POA: Diagnosis not present

## 2018-07-19 DIAGNOSIS — J449 Chronic obstructive pulmonary disease, unspecified: Secondary | ICD-10-CM | POA: Diagnosis not present

## 2018-07-19 DIAGNOSIS — R32 Unspecified urinary incontinence: Secondary | ICD-10-CM | POA: Diagnosis not present

## 2018-07-19 DIAGNOSIS — F0280 Dementia in other diseases classified elsewhere without behavioral disturbance: Secondary | ICD-10-CM | POA: Diagnosis not present

## 2018-07-19 DIAGNOSIS — J45909 Unspecified asthma, uncomplicated: Secondary | ICD-10-CM | POA: Diagnosis not present

## 2018-07-19 DIAGNOSIS — R2681 Unsteadiness on feet: Secondary | ICD-10-CM | POA: Diagnosis not present

## 2018-07-19 DIAGNOSIS — R269 Unspecified abnormalities of gait and mobility: Secondary | ICD-10-CM | POA: Diagnosis not present

## 2018-07-19 DIAGNOSIS — M6281 Muscle weakness (generalized): Secondary | ICD-10-CM | POA: Diagnosis not present

## 2018-07-20 DIAGNOSIS — J45909 Unspecified asthma, uncomplicated: Secondary | ICD-10-CM | POA: Diagnosis not present

## 2018-07-20 DIAGNOSIS — S32501D Unspecified fracture of right pubis, subsequent encounter for fracture with routine healing: Secondary | ICD-10-CM | POA: Diagnosis not present

## 2018-07-20 DIAGNOSIS — F0280 Dementia in other diseases classified elsewhere without behavioral disturbance: Secondary | ICD-10-CM | POA: Diagnosis not present

## 2018-07-20 DIAGNOSIS — J449 Chronic obstructive pulmonary disease, unspecified: Secondary | ICD-10-CM | POA: Diagnosis not present

## 2018-07-20 DIAGNOSIS — R32 Unspecified urinary incontinence: Secondary | ICD-10-CM | POA: Diagnosis not present

## 2018-07-20 DIAGNOSIS — R2681 Unsteadiness on feet: Secondary | ICD-10-CM | POA: Diagnosis not present

## 2018-07-20 DIAGNOSIS — M6281 Muscle weakness (generalized): Secondary | ICD-10-CM | POA: Diagnosis not present

## 2018-07-20 DIAGNOSIS — R269 Unspecified abnormalities of gait and mobility: Secondary | ICD-10-CM | POA: Diagnosis not present

## 2018-07-20 DIAGNOSIS — G309 Alzheimer's disease, unspecified: Secondary | ICD-10-CM | POA: Diagnosis not present

## 2018-07-21 DIAGNOSIS — R2681 Unsteadiness on feet: Secondary | ICD-10-CM | POA: Diagnosis not present

## 2018-07-21 DIAGNOSIS — M6281 Muscle weakness (generalized): Secondary | ICD-10-CM | POA: Diagnosis not present

## 2018-07-21 DIAGNOSIS — J45909 Unspecified asthma, uncomplicated: Secondary | ICD-10-CM | POA: Diagnosis not present

## 2018-07-21 DIAGNOSIS — R269 Unspecified abnormalities of gait and mobility: Secondary | ICD-10-CM | POA: Diagnosis not present

## 2018-07-21 DIAGNOSIS — G309 Alzheimer's disease, unspecified: Secondary | ICD-10-CM | POA: Diagnosis not present

## 2018-07-21 DIAGNOSIS — R32 Unspecified urinary incontinence: Secondary | ICD-10-CM | POA: Diagnosis not present

## 2018-07-21 DIAGNOSIS — F0280 Dementia in other diseases classified elsewhere without behavioral disturbance: Secondary | ICD-10-CM | POA: Diagnosis not present

## 2018-07-21 DIAGNOSIS — S32501D Unspecified fracture of right pubis, subsequent encounter for fracture with routine healing: Secondary | ICD-10-CM | POA: Diagnosis not present

## 2018-07-21 DIAGNOSIS — J449 Chronic obstructive pulmonary disease, unspecified: Secondary | ICD-10-CM | POA: Diagnosis not present

## 2018-07-24 DIAGNOSIS — S32501D Unspecified fracture of right pubis, subsequent encounter for fracture with routine healing: Secondary | ICD-10-CM | POA: Diagnosis not present

## 2018-07-24 DIAGNOSIS — J45909 Unspecified asthma, uncomplicated: Secondary | ICD-10-CM | POA: Diagnosis not present

## 2018-07-24 DIAGNOSIS — G309 Alzheimer's disease, unspecified: Secondary | ICD-10-CM | POA: Diagnosis not present

## 2018-07-24 DIAGNOSIS — R2681 Unsteadiness on feet: Secondary | ICD-10-CM | POA: Diagnosis not present

## 2018-07-24 DIAGNOSIS — F0280 Dementia in other diseases classified elsewhere without behavioral disturbance: Secondary | ICD-10-CM | POA: Diagnosis not present

## 2018-07-24 DIAGNOSIS — R32 Unspecified urinary incontinence: Secondary | ICD-10-CM | POA: Diagnosis not present

## 2018-07-24 DIAGNOSIS — M6281 Muscle weakness (generalized): Secondary | ICD-10-CM | POA: Diagnosis not present

## 2018-07-24 DIAGNOSIS — J449 Chronic obstructive pulmonary disease, unspecified: Secondary | ICD-10-CM | POA: Diagnosis not present

## 2018-07-24 DIAGNOSIS — R269 Unspecified abnormalities of gait and mobility: Secondary | ICD-10-CM | POA: Diagnosis not present

## 2018-07-25 DIAGNOSIS — M6281 Muscle weakness (generalized): Secondary | ICD-10-CM | POA: Diagnosis not present

## 2018-07-25 DIAGNOSIS — J45909 Unspecified asthma, uncomplicated: Secondary | ICD-10-CM | POA: Diagnosis not present

## 2018-07-25 DIAGNOSIS — G309 Alzheimer's disease, unspecified: Secondary | ICD-10-CM | POA: Diagnosis not present

## 2018-07-25 DIAGNOSIS — R32 Unspecified urinary incontinence: Secondary | ICD-10-CM | POA: Diagnosis not present

## 2018-07-25 DIAGNOSIS — F0280 Dementia in other diseases classified elsewhere without behavioral disturbance: Secondary | ICD-10-CM | POA: Diagnosis not present

## 2018-07-25 DIAGNOSIS — R269 Unspecified abnormalities of gait and mobility: Secondary | ICD-10-CM | POA: Diagnosis not present

## 2018-07-25 DIAGNOSIS — R2681 Unsteadiness on feet: Secondary | ICD-10-CM | POA: Diagnosis not present

## 2018-07-25 DIAGNOSIS — S32501D Unspecified fracture of right pubis, subsequent encounter for fracture with routine healing: Secondary | ICD-10-CM | POA: Diagnosis not present

## 2018-07-25 DIAGNOSIS — J449 Chronic obstructive pulmonary disease, unspecified: Secondary | ICD-10-CM | POA: Diagnosis not present

## 2018-07-26 DIAGNOSIS — J449 Chronic obstructive pulmonary disease, unspecified: Secondary | ICD-10-CM | POA: Diagnosis not present

## 2018-07-26 DIAGNOSIS — R2681 Unsteadiness on feet: Secondary | ICD-10-CM | POA: Diagnosis not present

## 2018-07-26 DIAGNOSIS — G309 Alzheimer's disease, unspecified: Secondary | ICD-10-CM | POA: Diagnosis not present

## 2018-07-26 DIAGNOSIS — M6281 Muscle weakness (generalized): Secondary | ICD-10-CM | POA: Diagnosis not present

## 2018-07-26 DIAGNOSIS — R32 Unspecified urinary incontinence: Secondary | ICD-10-CM | POA: Diagnosis not present

## 2018-07-26 DIAGNOSIS — F0280 Dementia in other diseases classified elsewhere without behavioral disturbance: Secondary | ICD-10-CM | POA: Diagnosis not present

## 2018-07-26 DIAGNOSIS — S32501D Unspecified fracture of right pubis, subsequent encounter for fracture with routine healing: Secondary | ICD-10-CM | POA: Diagnosis not present

## 2018-07-26 DIAGNOSIS — J45909 Unspecified asthma, uncomplicated: Secondary | ICD-10-CM | POA: Diagnosis not present

## 2018-07-26 DIAGNOSIS — R269 Unspecified abnormalities of gait and mobility: Secondary | ICD-10-CM | POA: Diagnosis not present

## 2018-07-27 DIAGNOSIS — J449 Chronic obstructive pulmonary disease, unspecified: Secondary | ICD-10-CM | POA: Diagnosis not present

## 2018-07-27 DIAGNOSIS — R2681 Unsteadiness on feet: Secondary | ICD-10-CM | POA: Diagnosis not present

## 2018-07-27 DIAGNOSIS — R269 Unspecified abnormalities of gait and mobility: Secondary | ICD-10-CM | POA: Diagnosis not present

## 2018-07-27 DIAGNOSIS — S32501D Unspecified fracture of right pubis, subsequent encounter for fracture with routine healing: Secondary | ICD-10-CM | POA: Diagnosis not present

## 2018-07-27 DIAGNOSIS — F0280 Dementia in other diseases classified elsewhere without behavioral disturbance: Secondary | ICD-10-CM | POA: Diagnosis not present

## 2018-07-27 DIAGNOSIS — M6281 Muscle weakness (generalized): Secondary | ICD-10-CM | POA: Diagnosis not present

## 2018-07-27 DIAGNOSIS — J45909 Unspecified asthma, uncomplicated: Secondary | ICD-10-CM | POA: Diagnosis not present

## 2018-07-27 DIAGNOSIS — G309 Alzheimer's disease, unspecified: Secondary | ICD-10-CM | POA: Diagnosis not present

## 2018-07-27 DIAGNOSIS — R32 Unspecified urinary incontinence: Secondary | ICD-10-CM | POA: Diagnosis not present

## 2018-07-28 DIAGNOSIS — J449 Chronic obstructive pulmonary disease, unspecified: Secondary | ICD-10-CM | POA: Diagnosis not present

## 2018-07-28 DIAGNOSIS — R2681 Unsteadiness on feet: Secondary | ICD-10-CM | POA: Diagnosis not present

## 2018-07-28 DIAGNOSIS — R269 Unspecified abnormalities of gait and mobility: Secondary | ICD-10-CM | POA: Diagnosis not present

## 2018-07-28 DIAGNOSIS — J45909 Unspecified asthma, uncomplicated: Secondary | ICD-10-CM | POA: Diagnosis not present

## 2018-07-28 DIAGNOSIS — M6281 Muscle weakness (generalized): Secondary | ICD-10-CM | POA: Diagnosis not present

## 2018-07-28 DIAGNOSIS — S32501D Unspecified fracture of right pubis, subsequent encounter for fracture with routine healing: Secondary | ICD-10-CM | POA: Diagnosis not present

## 2018-07-28 DIAGNOSIS — F0280 Dementia in other diseases classified elsewhere without behavioral disturbance: Secondary | ICD-10-CM | POA: Diagnosis not present

## 2018-07-28 DIAGNOSIS — G309 Alzheimer's disease, unspecified: Secondary | ICD-10-CM | POA: Diagnosis not present

## 2018-07-28 DIAGNOSIS — R32 Unspecified urinary incontinence: Secondary | ICD-10-CM | POA: Diagnosis not present

## 2018-07-31 DIAGNOSIS — J45909 Unspecified asthma, uncomplicated: Secondary | ICD-10-CM | POA: Diagnosis not present

## 2018-07-31 DIAGNOSIS — G309 Alzheimer's disease, unspecified: Secondary | ICD-10-CM | POA: Diagnosis not present

## 2018-07-31 DIAGNOSIS — R2681 Unsteadiness on feet: Secondary | ICD-10-CM | POA: Diagnosis not present

## 2018-07-31 DIAGNOSIS — M6281 Muscle weakness (generalized): Secondary | ICD-10-CM | POA: Diagnosis not present

## 2018-07-31 DIAGNOSIS — F0280 Dementia in other diseases classified elsewhere without behavioral disturbance: Secondary | ICD-10-CM | POA: Diagnosis not present

## 2018-07-31 DIAGNOSIS — J449 Chronic obstructive pulmonary disease, unspecified: Secondary | ICD-10-CM | POA: Diagnosis not present

## 2018-07-31 DIAGNOSIS — R32 Unspecified urinary incontinence: Secondary | ICD-10-CM | POA: Diagnosis not present

## 2018-07-31 DIAGNOSIS — S32501D Unspecified fracture of right pubis, subsequent encounter for fracture with routine healing: Secondary | ICD-10-CM | POA: Diagnosis not present

## 2018-07-31 DIAGNOSIS — R269 Unspecified abnormalities of gait and mobility: Secondary | ICD-10-CM | POA: Diagnosis not present

## 2018-08-01 DIAGNOSIS — G309 Alzheimer's disease, unspecified: Secondary | ICD-10-CM | POA: Diagnosis not present

## 2018-08-01 DIAGNOSIS — J45909 Unspecified asthma, uncomplicated: Secondary | ICD-10-CM | POA: Diagnosis not present

## 2018-08-01 DIAGNOSIS — R269 Unspecified abnormalities of gait and mobility: Secondary | ICD-10-CM | POA: Diagnosis not present

## 2018-08-01 DIAGNOSIS — J449 Chronic obstructive pulmonary disease, unspecified: Secondary | ICD-10-CM | POA: Diagnosis not present

## 2018-08-01 DIAGNOSIS — S32501D Unspecified fracture of right pubis, subsequent encounter for fracture with routine healing: Secondary | ICD-10-CM | POA: Diagnosis not present

## 2018-08-01 DIAGNOSIS — F0280 Dementia in other diseases classified elsewhere without behavioral disturbance: Secondary | ICD-10-CM | POA: Diagnosis not present

## 2018-08-01 DIAGNOSIS — R2681 Unsteadiness on feet: Secondary | ICD-10-CM | POA: Diagnosis not present

## 2018-08-01 DIAGNOSIS — M6281 Muscle weakness (generalized): Secondary | ICD-10-CM | POA: Diagnosis not present

## 2018-08-01 DIAGNOSIS — R32 Unspecified urinary incontinence: Secondary | ICD-10-CM | POA: Diagnosis not present

## 2018-08-02 DIAGNOSIS — R32 Unspecified urinary incontinence: Secondary | ICD-10-CM | POA: Diagnosis not present

## 2018-08-02 DIAGNOSIS — J449 Chronic obstructive pulmonary disease, unspecified: Secondary | ICD-10-CM | POA: Diagnosis not present

## 2018-08-02 DIAGNOSIS — R269 Unspecified abnormalities of gait and mobility: Secondary | ICD-10-CM | POA: Diagnosis not present

## 2018-08-02 DIAGNOSIS — R2681 Unsteadiness on feet: Secondary | ICD-10-CM | POA: Diagnosis not present

## 2018-08-02 DIAGNOSIS — G309 Alzheimer's disease, unspecified: Secondary | ICD-10-CM | POA: Diagnosis not present

## 2018-08-02 DIAGNOSIS — F0280 Dementia in other diseases classified elsewhere without behavioral disturbance: Secondary | ICD-10-CM | POA: Diagnosis not present

## 2018-08-02 DIAGNOSIS — J45909 Unspecified asthma, uncomplicated: Secondary | ICD-10-CM | POA: Diagnosis not present

## 2018-08-02 DIAGNOSIS — S32501D Unspecified fracture of right pubis, subsequent encounter for fracture with routine healing: Secondary | ICD-10-CM | POA: Diagnosis not present

## 2018-08-02 DIAGNOSIS — M6281 Muscle weakness (generalized): Secondary | ICD-10-CM | POA: Diagnosis not present

## 2018-08-03 DIAGNOSIS — R269 Unspecified abnormalities of gait and mobility: Secondary | ICD-10-CM | POA: Diagnosis not present

## 2018-08-03 DIAGNOSIS — R2681 Unsteadiness on feet: Secondary | ICD-10-CM | POA: Diagnosis not present

## 2018-08-03 DIAGNOSIS — F339 Major depressive disorder, recurrent, unspecified: Secondary | ICD-10-CM | POA: Diagnosis not present

## 2018-08-03 DIAGNOSIS — Z0000 Encounter for general adult medical examination without abnormal findings: Secondary | ICD-10-CM | POA: Diagnosis not present

## 2018-08-03 DIAGNOSIS — G309 Alzheimer's disease, unspecified: Secondary | ICD-10-CM | POA: Diagnosis not present

## 2018-08-03 DIAGNOSIS — F0280 Dementia in other diseases classified elsewhere without behavioral disturbance: Secondary | ICD-10-CM | POA: Diagnosis not present

## 2018-08-03 DIAGNOSIS — M6281 Muscle weakness (generalized): Secondary | ICD-10-CM | POA: Diagnosis not present

## 2018-08-03 DIAGNOSIS — S32501D Unspecified fracture of right pubis, subsequent encounter for fracture with routine healing: Secondary | ICD-10-CM | POA: Diagnosis not present

## 2018-08-03 DIAGNOSIS — F419 Anxiety disorder, unspecified: Secondary | ICD-10-CM | POA: Diagnosis not present

## 2018-08-03 DIAGNOSIS — J45909 Unspecified asthma, uncomplicated: Secondary | ICD-10-CM | POA: Diagnosis not present

## 2018-08-03 DIAGNOSIS — R32 Unspecified urinary incontinence: Secondary | ICD-10-CM | POA: Diagnosis not present

## 2018-08-03 DIAGNOSIS — J449 Chronic obstructive pulmonary disease, unspecified: Secondary | ICD-10-CM | POA: Diagnosis not present

## 2018-08-09 DIAGNOSIS — M6281 Muscle weakness (generalized): Secondary | ICD-10-CM | POA: Diagnosis not present

## 2018-08-09 DIAGNOSIS — E559 Vitamin D deficiency, unspecified: Secondary | ICD-10-CM | POA: Diagnosis not present

## 2018-08-09 DIAGNOSIS — F330 Major depressive disorder, recurrent, mild: Secondary | ICD-10-CM | POA: Diagnosis not present

## 2018-08-09 DIAGNOSIS — K5900 Constipation, unspecified: Secondary | ICD-10-CM | POA: Diagnosis not present

## 2018-08-09 DIAGNOSIS — J449 Chronic obstructive pulmonary disease, unspecified: Secondary | ICD-10-CM | POA: Diagnosis not present

## 2018-08-09 DIAGNOSIS — F0280 Dementia in other diseases classified elsewhere without behavioral disturbance: Secondary | ICD-10-CM | POA: Diagnosis not present

## 2018-08-09 DIAGNOSIS — F419 Anxiety disorder, unspecified: Secondary | ICD-10-CM | POA: Diagnosis not present

## 2018-08-09 DIAGNOSIS — R05 Cough: Secondary | ICD-10-CM | POA: Diagnosis not present

## 2018-08-15 DIAGNOSIS — G301 Alzheimer's disease with late onset: Secondary | ICD-10-CM | POA: Diagnosis not present

## 2018-08-15 DIAGNOSIS — G4701 Insomnia due to medical condition: Secondary | ICD-10-CM | POA: Diagnosis not present

## 2018-08-15 DIAGNOSIS — F330 Major depressive disorder, recurrent, mild: Secondary | ICD-10-CM | POA: Diagnosis not present

## 2018-08-15 DIAGNOSIS — F0280 Dementia in other diseases classified elsewhere without behavioral disturbance: Secondary | ICD-10-CM | POA: Diagnosis not present

## 2018-08-19 DIAGNOSIS — S92342A Displaced fracture of fourth metatarsal bone, left foot, initial encounter for closed fracture: Secondary | ICD-10-CM | POA: Diagnosis not present

## 2018-08-19 DIAGNOSIS — S92322A Displaced fracture of second metatarsal bone, left foot, initial encounter for closed fracture: Secondary | ICD-10-CM | POA: Diagnosis not present

## 2018-08-19 DIAGNOSIS — M86272 Subacute osteomyelitis, left ankle and foot: Secondary | ICD-10-CM | POA: Diagnosis not present

## 2018-08-19 DIAGNOSIS — S92343A Displaced fracture of fourth metatarsal bone, unspecified foot, initial encounter for closed fracture: Secondary | ICD-10-CM | POA: Diagnosis not present

## 2018-08-19 DIAGNOSIS — M25462 Effusion, left knee: Secondary | ICD-10-CM | POA: Diagnosis not present

## 2018-08-19 DIAGNOSIS — M898X7 Other specified disorders of bone, ankle and foot: Secondary | ICD-10-CM | POA: Diagnosis not present

## 2018-08-19 DIAGNOSIS — S82232A Displaced oblique fracture of shaft of left tibia, initial encounter for closed fracture: Secondary | ICD-10-CM | POA: Diagnosis not present

## 2018-08-19 DIAGNOSIS — J45909 Unspecified asthma, uncomplicated: Secondary | ICD-10-CM | POA: Diagnosis not present

## 2018-08-19 DIAGNOSIS — S72451A Displaced supracondylar fracture without intracondylar extension of lower end of right femur, initial encounter for closed fracture: Secondary | ICD-10-CM | POA: Diagnosis not present

## 2018-08-19 DIAGNOSIS — S82892A Other fracture of left lower leg, initial encounter for closed fracture: Secondary | ICD-10-CM | POA: Diagnosis not present

## 2018-08-19 DIAGNOSIS — W19XXXA Unspecified fall, initial encounter: Secondary | ICD-10-CM | POA: Diagnosis not present

## 2018-08-19 DIAGNOSIS — S72401A Unspecified fracture of lower end of right femur, initial encounter for closed fracture: Secondary | ICD-10-CM | POA: Diagnosis not present

## 2018-08-19 DIAGNOSIS — S82402A Unspecified fracture of shaft of left fibula, initial encounter for closed fracture: Secondary | ICD-10-CM | POA: Diagnosis not present

## 2018-08-19 DIAGNOSIS — R279 Unspecified lack of coordination: Secondary | ICD-10-CM | POA: Diagnosis not present

## 2018-08-19 DIAGNOSIS — G309 Alzheimer's disease, unspecified: Secondary | ICD-10-CM | POA: Diagnosis not present

## 2018-08-19 DIAGNOSIS — G894 Chronic pain syndrome: Secondary | ICD-10-CM | POA: Diagnosis not present

## 2018-08-19 DIAGNOSIS — S72401D Unspecified fracture of lower end of right femur, subsequent encounter for closed fracture with routine healing: Secondary | ICD-10-CM | POA: Diagnosis not present

## 2018-08-19 DIAGNOSIS — M79605 Pain in left leg: Secondary | ICD-10-CM | POA: Diagnosis not present

## 2018-08-19 DIAGNOSIS — M1611 Unilateral primary osteoarthritis, right hip: Secondary | ICD-10-CM | POA: Diagnosis not present

## 2018-08-19 DIAGNOSIS — M533 Sacrococcygeal disorders, not elsewhere classified: Secondary | ICD-10-CM | POA: Diagnosis not present

## 2018-08-19 DIAGNOSIS — J449 Chronic obstructive pulmonary disease, unspecified: Secondary | ICD-10-CM | POA: Diagnosis not present

## 2018-08-19 DIAGNOSIS — R32 Unspecified urinary incontinence: Secondary | ICD-10-CM | POA: Diagnosis not present

## 2018-08-19 DIAGNOSIS — M170 Bilateral primary osteoarthritis of knee: Secondary | ICD-10-CM | POA: Diagnosis not present

## 2018-08-19 DIAGNOSIS — Z4789 Encounter for other orthopedic aftercare: Secondary | ICD-10-CM | POA: Diagnosis not present

## 2018-08-19 DIAGNOSIS — S82252A Displaced comminuted fracture of shaft of left tibia, initial encounter for closed fracture: Secondary | ICD-10-CM | POA: Diagnosis not present

## 2018-08-19 DIAGNOSIS — M21272 Flexion deformity, left ankle and toes: Secondary | ICD-10-CM | POA: Diagnosis not present

## 2018-08-19 DIAGNOSIS — S72431A Displaced fracture of medial condyle of right femur, initial encounter for closed fracture: Secondary | ICD-10-CM | POA: Diagnosis not present

## 2018-08-19 DIAGNOSIS — S32501D Unspecified fracture of right pubis, subsequent encounter for fracture with routine healing: Secondary | ICD-10-CM | POA: Diagnosis not present

## 2018-08-19 DIAGNOSIS — S32810D Multiple fractures of pelvis with stable disruption of pelvic ring, subsequent encounter for fracture with routine healing: Secondary | ICD-10-CM | POA: Diagnosis not present

## 2018-08-19 DIAGNOSIS — S82302A Unspecified fracture of lower end of left tibia, initial encounter for closed fracture: Secondary | ICD-10-CM | POA: Diagnosis not present

## 2018-08-19 DIAGNOSIS — M19072 Primary osteoarthritis, left ankle and foot: Secondary | ICD-10-CM | POA: Diagnosis not present

## 2018-08-19 DIAGNOSIS — S82202A Unspecified fracture of shaft of left tibia, initial encounter for closed fracture: Secondary | ICD-10-CM | POA: Diagnosis not present

## 2018-08-19 DIAGNOSIS — S82462A Displaced segmental fracture of shaft of left fibula, initial encounter for closed fracture: Secondary | ICD-10-CM | POA: Diagnosis not present

## 2018-08-19 DIAGNOSIS — Z043 Encounter for examination and observation following other accident: Secondary | ICD-10-CM | POA: Diagnosis not present

## 2018-08-19 DIAGNOSIS — S92341A Displaced fracture of fourth metatarsal bone, right foot, initial encounter for closed fracture: Secondary | ICD-10-CM | POA: Diagnosis not present

## 2018-08-19 DIAGNOSIS — M1712 Unilateral primary osteoarthritis, left knee: Secondary | ICD-10-CM | POA: Diagnosis not present

## 2018-08-19 DIAGNOSIS — S92332A Displaced fracture of third metatarsal bone, left foot, initial encounter for closed fracture: Secondary | ICD-10-CM | POA: Diagnosis not present

## 2018-08-19 DIAGNOSIS — M8580 Other specified disorders of bone density and structure, unspecified site: Secondary | ICD-10-CM | POA: Diagnosis not present

## 2018-08-19 DIAGNOSIS — F0280 Dementia in other diseases classified elsewhere without behavioral disturbance: Secondary | ICD-10-CM | POA: Diagnosis not present

## 2018-08-19 DIAGNOSIS — Q452 Congenital pancreatic cyst: Secondary | ICD-10-CM | POA: Diagnosis not present

## 2018-08-19 DIAGNOSIS — F330 Major depressive disorder, recurrent, mild: Secondary | ICD-10-CM | POA: Diagnosis not present

## 2018-08-19 DIAGNOSIS — Z96642 Presence of left artificial hip joint: Secondary | ICD-10-CM | POA: Diagnosis not present

## 2018-08-19 DIAGNOSIS — N179 Acute kidney failure, unspecified: Secondary | ICD-10-CM | POA: Diagnosis not present

## 2018-08-19 DIAGNOSIS — R296 Repeated falls: Secondary | ICD-10-CM | POA: Diagnosis not present

## 2018-08-19 DIAGNOSIS — S72432A Displaced fracture of medial condyle of left femur, initial encounter for closed fracture: Secondary | ICD-10-CM | POA: Diagnosis not present

## 2018-08-19 DIAGNOSIS — Y998 Other external cause status: Secondary | ICD-10-CM | POA: Diagnosis not present

## 2018-08-19 DIAGNOSIS — G301 Alzheimer's disease with late onset: Secondary | ICD-10-CM | POA: Diagnosis not present

## 2018-08-19 DIAGNOSIS — S82452A Displaced comminuted fracture of shaft of left fibula, initial encounter for closed fracture: Secondary | ICD-10-CM | POA: Diagnosis not present

## 2018-08-19 DIAGNOSIS — I878 Other specified disorders of veins: Secondary | ICD-10-CM | POA: Diagnosis not present

## 2018-08-19 DIAGNOSIS — Z9181 History of falling: Secondary | ICD-10-CM | POA: Diagnosis not present

## 2018-08-19 DIAGNOSIS — Z743 Need for continuous supervision: Secondary | ICD-10-CM | POA: Diagnosis not present

## 2018-08-19 DIAGNOSIS — S82892D Other fracture of left lower leg, subsequent encounter for closed fracture with routine healing: Secondary | ICD-10-CM | POA: Diagnosis not present

## 2018-08-19 DIAGNOSIS — M47816 Spondylosis without myelopathy or radiculopathy, lumbar region: Secondary | ICD-10-CM | POA: Diagnosis not present

## 2018-08-19 DIAGNOSIS — R0981 Nasal congestion: Secondary | ICD-10-CM | POA: Diagnosis not present

## 2018-08-19 DIAGNOSIS — R5381 Other malaise: Secondary | ICD-10-CM | POA: Diagnosis not present

## 2018-08-19 DIAGNOSIS — M25572 Pain in left ankle and joints of left foot: Secondary | ICD-10-CM | POA: Diagnosis not present

## 2018-08-19 DIAGNOSIS — S82832A Other fracture of upper and lower end of left fibula, initial encounter for closed fracture: Secondary | ICD-10-CM | POA: Diagnosis not present

## 2018-08-19 DIAGNOSIS — S36222A Contusion of tail of pancreas, initial encounter: Secondary | ICD-10-CM | POA: Diagnosis not present

## 2018-08-19 DIAGNOSIS — R9431 Abnormal electrocardiogram [ECG] [EKG]: Secondary | ICD-10-CM | POA: Diagnosis not present

## 2018-08-19 DIAGNOSIS — M6281 Muscle weakness (generalized): Secondary | ICD-10-CM | POA: Diagnosis not present

## 2018-08-19 DIAGNOSIS — F339 Major depressive disorder, recurrent, unspecified: Secondary | ICD-10-CM | POA: Diagnosis not present

## 2018-08-19 DIAGNOSIS — M19071 Primary osteoarthritis, right ankle and foot: Secondary | ICD-10-CM | POA: Diagnosis not present

## 2018-08-19 DIAGNOSIS — Z4802 Encounter for removal of sutures: Secondary | ICD-10-CM | POA: Diagnosis not present

## 2018-08-19 DIAGNOSIS — G4701 Insomnia due to medical condition: Secondary | ICD-10-CM | POA: Diagnosis not present

## 2018-08-19 DIAGNOSIS — D62 Acute posthemorrhagic anemia: Secondary | ICD-10-CM | POA: Diagnosis not present

## 2018-08-19 DIAGNOSIS — M7752 Other enthesopathy of left foot: Secondary | ICD-10-CM | POA: Diagnosis not present

## 2018-08-19 DIAGNOSIS — S72411A Displaced unspecified condyle fracture of lower end of right femur, initial encounter for closed fracture: Secondary | ICD-10-CM | POA: Diagnosis not present

## 2018-08-19 DIAGNOSIS — K862 Cyst of pancreas: Secondary | ICD-10-CM | POA: Diagnosis not present

## 2018-08-19 DIAGNOSIS — S82432A Displaced oblique fracture of shaft of left fibula, initial encounter for closed fracture: Secondary | ICD-10-CM | POA: Diagnosis not present

## 2018-08-19 DIAGNOSIS — M7751 Other enthesopathy of right foot: Secondary | ICD-10-CM | POA: Diagnosis not present

## 2018-08-19 DIAGNOSIS — C569 Malignant neoplasm of unspecified ovary: Secondary | ICD-10-CM | POA: Diagnosis not present

## 2018-08-23 DIAGNOSIS — Y998 Other external cause status: Secondary | ICD-10-CM | POA: Diagnosis not present

## 2018-08-23 DIAGNOSIS — M19072 Primary osteoarthritis, left ankle and foot: Secondary | ICD-10-CM | POA: Diagnosis not present

## 2018-08-23 DIAGNOSIS — S82402A Unspecified fracture of shaft of left fibula, initial encounter for closed fracture: Secondary | ICD-10-CM | POA: Diagnosis not present

## 2018-08-23 DIAGNOSIS — Z9181 History of falling: Secondary | ICD-10-CM | POA: Diagnosis not present

## 2018-08-23 DIAGNOSIS — S82202A Unspecified fracture of shaft of left tibia, initial encounter for closed fracture: Secondary | ICD-10-CM | POA: Diagnosis not present

## 2018-08-23 DIAGNOSIS — G309 Alzheimer's disease, unspecified: Secondary | ICD-10-CM | POA: Diagnosis not present

## 2018-08-23 DIAGNOSIS — S72401A Unspecified fracture of lower end of right femur, initial encounter for closed fracture: Secondary | ICD-10-CM | POA: Diagnosis not present

## 2018-08-23 DIAGNOSIS — Z96642 Presence of left artificial hip joint: Secondary | ICD-10-CM | POA: Diagnosis not present

## 2018-08-23 DIAGNOSIS — Z043 Encounter for examination and observation following other accident: Secondary | ICD-10-CM | POA: Diagnosis not present

## 2018-08-23 DIAGNOSIS — S92343A Displaced fracture of fourth metatarsal bone, unspecified foot, initial encounter for closed fracture: Secondary | ICD-10-CM | POA: Diagnosis not present

## 2018-08-23 DIAGNOSIS — K862 Cyst of pancreas: Secondary | ICD-10-CM | POA: Diagnosis not present

## 2018-08-23 DIAGNOSIS — C569 Malignant neoplasm of unspecified ovary: Secondary | ICD-10-CM | POA: Diagnosis not present

## 2018-08-23 DIAGNOSIS — M170 Bilateral primary osteoarthritis of knee: Secondary | ICD-10-CM | POA: Diagnosis not present

## 2018-08-23 DIAGNOSIS — S72451A Displaced supracondylar fracture without intracondylar extension of lower end of right femur, initial encounter for closed fracture: Secondary | ICD-10-CM | POA: Diagnosis not present

## 2018-08-23 DIAGNOSIS — M1712 Unilateral primary osteoarthritis, left knee: Secondary | ICD-10-CM | POA: Diagnosis not present

## 2018-08-23 DIAGNOSIS — S72411A Displaced unspecified condyle fracture of lower end of right femur, initial encounter for closed fracture: Secondary | ICD-10-CM | POA: Diagnosis not present

## 2018-08-23 DIAGNOSIS — Z743 Need for continuous supervision: Secondary | ICD-10-CM | POA: Diagnosis not present

## 2018-08-23 DIAGNOSIS — M25572 Pain in left ankle and joints of left foot: Secondary | ICD-10-CM | POA: Diagnosis not present

## 2018-08-23 DIAGNOSIS — M533 Sacrococcygeal disorders, not elsewhere classified: Secondary | ICD-10-CM | POA: Diagnosis not present

## 2018-08-23 DIAGNOSIS — S72431A Displaced fracture of medial condyle of right femur, initial encounter for closed fracture: Secondary | ICD-10-CM | POA: Diagnosis not present

## 2018-08-23 DIAGNOSIS — S82832A Other fracture of upper and lower end of left fibula, initial encounter for closed fracture: Secondary | ICD-10-CM | POA: Diagnosis not present

## 2018-08-23 DIAGNOSIS — M79605 Pain in left leg: Secondary | ICD-10-CM | POA: Diagnosis not present

## 2018-08-23 DIAGNOSIS — S92322A Displaced fracture of second metatarsal bone, left foot, initial encounter for closed fracture: Secondary | ICD-10-CM | POA: Diagnosis not present

## 2018-08-23 DIAGNOSIS — S92342A Displaced fracture of fourth metatarsal bone, left foot, initial encounter for closed fracture: Secondary | ICD-10-CM | POA: Diagnosis not present

## 2018-08-23 DIAGNOSIS — S36222A Contusion of tail of pancreas, initial encounter: Secondary | ICD-10-CM | POA: Diagnosis not present

## 2018-08-23 DIAGNOSIS — S82462A Displaced segmental fracture of shaft of left fibula, initial encounter for closed fracture: Secondary | ICD-10-CM | POA: Diagnosis not present

## 2018-08-23 DIAGNOSIS — S82892D Other fracture of left lower leg, subsequent encounter for closed fracture with routine healing: Secondary | ICD-10-CM | POA: Diagnosis not present

## 2018-08-23 DIAGNOSIS — S82432A Displaced oblique fracture of shaft of left fibula, initial encounter for closed fracture: Secondary | ICD-10-CM | POA: Diagnosis not present

## 2018-08-23 DIAGNOSIS — S82232A Displaced oblique fracture of shaft of left tibia, initial encounter for closed fracture: Secondary | ICD-10-CM | POA: Diagnosis not present

## 2018-08-23 DIAGNOSIS — M47816 Spondylosis without myelopathy or radiculopathy, lumbar region: Secondary | ICD-10-CM | POA: Diagnosis not present

## 2018-08-23 DIAGNOSIS — S72401D Unspecified fracture of lower end of right femur, subsequent encounter for closed fracture with routine healing: Secondary | ICD-10-CM | POA: Diagnosis not present

## 2018-08-23 DIAGNOSIS — S82452A Displaced comminuted fracture of shaft of left fibula, initial encounter for closed fracture: Secondary | ICD-10-CM | POA: Diagnosis not present

## 2018-08-23 DIAGNOSIS — M6281 Muscle weakness (generalized): Secondary | ICD-10-CM | POA: Diagnosis not present

## 2018-08-23 DIAGNOSIS — R279 Unspecified lack of coordination: Secondary | ICD-10-CM | POA: Diagnosis not present

## 2018-08-23 DIAGNOSIS — S92332A Displaced fracture of third metatarsal bone, left foot, initial encounter for closed fracture: Secondary | ICD-10-CM | POA: Diagnosis not present

## 2018-08-23 DIAGNOSIS — M25462 Effusion, left knee: Secondary | ICD-10-CM | POA: Diagnosis not present

## 2018-08-23 DIAGNOSIS — S82892A Other fracture of left lower leg, initial encounter for closed fracture: Secondary | ICD-10-CM | POA: Diagnosis not present

## 2018-08-23 DIAGNOSIS — S32810D Multiple fractures of pelvis with stable disruption of pelvic ring, subsequent encounter for fracture with routine healing: Secondary | ICD-10-CM | POA: Diagnosis not present

## 2018-08-23 DIAGNOSIS — I878 Other specified disorders of veins: Secondary | ICD-10-CM | POA: Diagnosis not present

## 2018-08-23 DIAGNOSIS — J45909 Unspecified asthma, uncomplicated: Secondary | ICD-10-CM | POA: Diagnosis not present

## 2018-08-23 DIAGNOSIS — S72432A Displaced fracture of medial condyle of left femur, initial encounter for closed fracture: Secondary | ICD-10-CM | POA: Diagnosis not present

## 2018-08-23 DIAGNOSIS — R32 Unspecified urinary incontinence: Secondary | ICD-10-CM | POA: Diagnosis not present

## 2018-08-23 DIAGNOSIS — W19XXXA Unspecified fall, initial encounter: Secondary | ICD-10-CM | POA: Diagnosis not present

## 2018-08-23 DIAGNOSIS — S82252A Displaced comminuted fracture of shaft of left tibia, initial encounter for closed fracture: Secondary | ICD-10-CM | POA: Diagnosis not present

## 2018-08-23 DIAGNOSIS — M7751 Other enthesopathy of right foot: Secondary | ICD-10-CM | POA: Diagnosis not present

## 2018-08-23 DIAGNOSIS — J449 Chronic obstructive pulmonary disease, unspecified: Secondary | ICD-10-CM | POA: Diagnosis not present

## 2018-08-23 DIAGNOSIS — M86272 Subacute osteomyelitis, left ankle and foot: Secondary | ICD-10-CM | POA: Diagnosis not present

## 2018-08-23 DIAGNOSIS — R9431 Abnormal electrocardiogram [ECG] [EKG]: Secondary | ICD-10-CM | POA: Diagnosis not present

## 2018-08-23 DIAGNOSIS — S82302A Unspecified fracture of lower end of left tibia, initial encounter for closed fracture: Secondary | ICD-10-CM | POA: Diagnosis not present

## 2018-08-23 DIAGNOSIS — M7752 Other enthesopathy of left foot: Secondary | ICD-10-CM | POA: Diagnosis not present

## 2018-08-23 DIAGNOSIS — M19071 Primary osteoarthritis, right ankle and foot: Secondary | ICD-10-CM | POA: Diagnosis not present

## 2018-08-23 DIAGNOSIS — M21272 Flexion deformity, left ankle and toes: Secondary | ICD-10-CM | POA: Diagnosis not present

## 2018-08-23 DIAGNOSIS — M898X7 Other specified disorders of bone, ankle and foot: Secondary | ICD-10-CM | POA: Diagnosis not present

## 2018-08-23 DIAGNOSIS — D62 Acute posthemorrhagic anemia: Secondary | ICD-10-CM | POA: Diagnosis not present

## 2018-08-23 DIAGNOSIS — R5381 Other malaise: Secondary | ICD-10-CM | POA: Diagnosis not present

## 2018-08-23 DIAGNOSIS — M1611 Unilateral primary osteoarthritis, right hip: Secondary | ICD-10-CM | POA: Diagnosis not present

## 2018-08-23 DIAGNOSIS — N179 Acute kidney failure, unspecified: Secondary | ICD-10-CM | POA: Diagnosis not present

## 2018-08-23 DIAGNOSIS — M8580 Other specified disorders of bone density and structure, unspecified site: Secondary | ICD-10-CM | POA: Diagnosis not present

## 2018-08-23 LAB — COMPREHENSIVE METABOLIC PANEL: Calcium: 8.8 (ref 8.7–10.7)

## 2018-08-23 LAB — BASIC METABOLIC PANEL
CO2: 25 — AB (ref 13–22)
Chloride: 106 (ref 99–108)
Creatinine: 1.1 (ref 0.5–1.1)
Glucose: 97
Sodium: 140 (ref 137–147)

## 2018-08-29 DIAGNOSIS — C569 Malignant neoplasm of unspecified ovary: Secondary | ICD-10-CM | POA: Diagnosis not present

## 2018-08-29 DIAGNOSIS — S72431A Displaced fracture of medial condyle of right femur, initial encounter for closed fracture: Secondary | ICD-10-CM | POA: Diagnosis not present

## 2018-08-29 DIAGNOSIS — R296 Repeated falls: Secondary | ICD-10-CM | POA: Diagnosis not present

## 2018-08-29 DIAGNOSIS — M6281 Muscle weakness (generalized): Secondary | ICD-10-CM | POA: Diagnosis not present

## 2018-08-29 DIAGNOSIS — J449 Chronic obstructive pulmonary disease, unspecified: Secondary | ICD-10-CM | POA: Diagnosis not present

## 2018-08-29 DIAGNOSIS — N179 Acute kidney failure, unspecified: Secondary | ICD-10-CM | POA: Diagnosis not present

## 2018-08-29 DIAGNOSIS — S82402A Unspecified fracture of shaft of left fibula, initial encounter for closed fracture: Secondary | ICD-10-CM | POA: Diagnosis not present

## 2018-08-29 DIAGNOSIS — Q452 Congenital pancreatic cyst: Secondary | ICD-10-CM | POA: Diagnosis not present

## 2018-08-29 DIAGNOSIS — S92341A Displaced fracture of fourth metatarsal bone, right foot, initial encounter for closed fracture: Secondary | ICD-10-CM | POA: Diagnosis not present

## 2018-08-29 LAB — LIPID PANEL
Cholesterol: 124 (ref 0–200)
HDL: 32 — AB (ref 35–70)
LDL Cholesterol: 68
Triglycerides: 108 (ref 40–160)

## 2018-08-29 LAB — HEPATIC FUNCTION PANEL
ALT: 9 (ref 7–35)
AST: 12 — AB (ref 13–35)
Alkaline Phosphatase: 64 (ref 25–125)
Bilirubin, Total: 0.7

## 2018-08-29 LAB — BASIC METABOLIC PANEL
BUN: 22 — AB (ref 4–21)
CO2: 34 — AB (ref 13–22)
Potassium: 4.9 (ref 3.4–5.3)
Sodium: 145 (ref 137–147)

## 2018-08-29 LAB — VITAMIN B12: Vitamin B-12: 248

## 2018-08-29 LAB — CBC AND DIFFERENTIAL
HCT: 29 — AB (ref 36–46)
Hemoglobin: 9.7 — AB (ref 12.0–16.0)
Platelets: 370 (ref 150–399)
WBC: 9.2

## 2018-08-29 LAB — TSH: TSH: 0.9 (ref 0.41–5.90)

## 2018-08-29 LAB — COMPREHENSIVE METABOLIC PANEL
GFR calc Af Amer: 60
GFR calc non Af Amer: 54.3

## 2018-08-29 LAB — CBC: RBC: 3.42 — AB (ref 3.87–5.11)

## 2018-08-31 DIAGNOSIS — M6281 Muscle weakness (generalized): Secondary | ICD-10-CM | POA: Diagnosis not present

## 2018-08-31 DIAGNOSIS — Q452 Congenital pancreatic cyst: Secondary | ICD-10-CM | POA: Diagnosis not present

## 2018-08-31 DIAGNOSIS — C569 Malignant neoplasm of unspecified ovary: Secondary | ICD-10-CM | POA: Diagnosis not present

## 2018-08-31 DIAGNOSIS — R296 Repeated falls: Secondary | ICD-10-CM | POA: Diagnosis not present

## 2018-08-31 DIAGNOSIS — J449 Chronic obstructive pulmonary disease, unspecified: Secondary | ICD-10-CM | POA: Diagnosis not present

## 2018-08-31 DIAGNOSIS — N179 Acute kidney failure, unspecified: Secondary | ICD-10-CM | POA: Diagnosis not present

## 2018-08-31 DIAGNOSIS — S72431A Displaced fracture of medial condyle of right femur, initial encounter for closed fracture: Secondary | ICD-10-CM | POA: Diagnosis not present

## 2018-08-31 DIAGNOSIS — S92341A Displaced fracture of fourth metatarsal bone, right foot, initial encounter for closed fracture: Secondary | ICD-10-CM | POA: Diagnosis not present

## 2018-08-31 DIAGNOSIS — S82402A Unspecified fracture of shaft of left fibula, initial encounter for closed fracture: Secondary | ICD-10-CM | POA: Diagnosis not present

## 2018-09-05 DIAGNOSIS — Q452 Congenital pancreatic cyst: Secondary | ICD-10-CM | POA: Diagnosis not present

## 2018-09-05 DIAGNOSIS — M6281 Muscle weakness (generalized): Secondary | ICD-10-CM | POA: Diagnosis not present

## 2018-09-05 DIAGNOSIS — S82402A Unspecified fracture of shaft of left fibula, initial encounter for closed fracture: Secondary | ICD-10-CM | POA: Diagnosis not present

## 2018-09-05 DIAGNOSIS — C569 Malignant neoplasm of unspecified ovary: Secondary | ICD-10-CM | POA: Diagnosis not present

## 2018-09-05 DIAGNOSIS — R296 Repeated falls: Secondary | ICD-10-CM | POA: Diagnosis not present

## 2018-09-05 DIAGNOSIS — S92341A Displaced fracture of fourth metatarsal bone, right foot, initial encounter for closed fracture: Secondary | ICD-10-CM | POA: Diagnosis not present

## 2018-09-05 DIAGNOSIS — S72431A Displaced fracture of medial condyle of right femur, initial encounter for closed fracture: Secondary | ICD-10-CM | POA: Diagnosis not present

## 2018-09-05 DIAGNOSIS — N179 Acute kidney failure, unspecified: Secondary | ICD-10-CM | POA: Diagnosis not present

## 2018-09-05 DIAGNOSIS — J449 Chronic obstructive pulmonary disease, unspecified: Secondary | ICD-10-CM | POA: Diagnosis not present

## 2018-09-06 DIAGNOSIS — N179 Acute kidney failure, unspecified: Secondary | ICD-10-CM | POA: Diagnosis not present

## 2018-09-06 DIAGNOSIS — S82402A Unspecified fracture of shaft of left fibula, initial encounter for closed fracture: Secondary | ICD-10-CM | POA: Diagnosis not present

## 2018-09-06 DIAGNOSIS — J449 Chronic obstructive pulmonary disease, unspecified: Secondary | ICD-10-CM | POA: Diagnosis not present

## 2018-09-06 DIAGNOSIS — S72431A Displaced fracture of medial condyle of right femur, initial encounter for closed fracture: Secondary | ICD-10-CM | POA: Diagnosis not present

## 2018-09-06 DIAGNOSIS — R0981 Nasal congestion: Secondary | ICD-10-CM | POA: Diagnosis not present

## 2018-09-06 DIAGNOSIS — Q452 Congenital pancreatic cyst: Secondary | ICD-10-CM | POA: Diagnosis not present

## 2018-09-06 DIAGNOSIS — S92341A Displaced fracture of fourth metatarsal bone, right foot, initial encounter for closed fracture: Secondary | ICD-10-CM | POA: Diagnosis not present

## 2018-09-06 DIAGNOSIS — C569 Malignant neoplasm of unspecified ovary: Secondary | ICD-10-CM | POA: Diagnosis not present

## 2018-09-06 DIAGNOSIS — M6281 Muscle weakness (generalized): Secondary | ICD-10-CM | POA: Diagnosis not present

## 2018-09-14 DIAGNOSIS — Z4789 Encounter for other orthopedic aftercare: Secondary | ICD-10-CM | POA: Diagnosis not present

## 2018-09-14 DIAGNOSIS — Z4802 Encounter for removal of sutures: Secondary | ICD-10-CM | POA: Diagnosis not present

## 2018-09-17 DIAGNOSIS — J45909 Unspecified asthma, uncomplicated: Secondary | ICD-10-CM | POA: Diagnosis not present

## 2018-09-17 DIAGNOSIS — F339 Major depressive disorder, recurrent, unspecified: Secondary | ICD-10-CM | POA: Diagnosis not present

## 2018-09-17 DIAGNOSIS — J449 Chronic obstructive pulmonary disease, unspecified: Secondary | ICD-10-CM | POA: Diagnosis not present

## 2018-09-17 DIAGNOSIS — M6281 Muscle weakness (generalized): Secondary | ICD-10-CM | POA: Diagnosis not present

## 2018-09-17 DIAGNOSIS — Z9181 History of falling: Secondary | ICD-10-CM | POA: Diagnosis not present

## 2018-09-17 DIAGNOSIS — S82892D Other fracture of left lower leg, subsequent encounter for closed fracture with routine healing: Secondary | ICD-10-CM | POA: Diagnosis not present

## 2018-09-17 DIAGNOSIS — R279 Unspecified lack of coordination: Secondary | ICD-10-CM | POA: Diagnosis not present

## 2018-09-17 DIAGNOSIS — F0280 Dementia in other diseases classified elsewhere without behavioral disturbance: Secondary | ICD-10-CM | POA: Diagnosis not present

## 2018-09-17 DIAGNOSIS — R32 Unspecified urinary incontinence: Secondary | ICD-10-CM | POA: Diagnosis not present

## 2018-09-17 DIAGNOSIS — K862 Cyst of pancreas: Secondary | ICD-10-CM | POA: Diagnosis not present

## 2018-09-17 DIAGNOSIS — G309 Alzheimer's disease, unspecified: Secondary | ICD-10-CM | POA: Diagnosis not present

## 2018-09-17 DIAGNOSIS — S72401D Unspecified fracture of lower end of right femur, subsequent encounter for closed fracture with routine healing: Secondary | ICD-10-CM | POA: Diagnosis not present

## 2018-09-17 DIAGNOSIS — N179 Acute kidney failure, unspecified: Secondary | ICD-10-CM | POA: Diagnosis not present

## 2018-09-17 DIAGNOSIS — S32501D Unspecified fracture of right pubis, subsequent encounter for fracture with routine healing: Secondary | ICD-10-CM | POA: Diagnosis not present

## 2018-09-19 DIAGNOSIS — C569 Malignant neoplasm of unspecified ovary: Secondary | ICD-10-CM | POA: Diagnosis not present

## 2018-09-19 DIAGNOSIS — R296 Repeated falls: Secondary | ICD-10-CM | POA: Diagnosis not present

## 2018-09-19 DIAGNOSIS — S82402A Unspecified fracture of shaft of left fibula, initial encounter for closed fracture: Secondary | ICD-10-CM | POA: Diagnosis not present

## 2018-09-19 DIAGNOSIS — M6281 Muscle weakness (generalized): Secondary | ICD-10-CM | POA: Diagnosis not present

## 2018-09-19 DIAGNOSIS — N179 Acute kidney failure, unspecified: Secondary | ICD-10-CM | POA: Diagnosis not present

## 2018-09-19 DIAGNOSIS — J449 Chronic obstructive pulmonary disease, unspecified: Secondary | ICD-10-CM | POA: Diagnosis not present

## 2018-09-19 DIAGNOSIS — S72431A Displaced fracture of medial condyle of right femur, initial encounter for closed fracture: Secondary | ICD-10-CM | POA: Diagnosis not present

## 2018-09-19 DIAGNOSIS — Q452 Congenital pancreatic cyst: Secondary | ICD-10-CM | POA: Diagnosis not present

## 2018-09-19 DIAGNOSIS — S92341A Displaced fracture of fourth metatarsal bone, right foot, initial encounter for closed fracture: Secondary | ICD-10-CM | POA: Diagnosis not present

## 2018-09-21 DIAGNOSIS — G894 Chronic pain syndrome: Secondary | ICD-10-CM | POA: Diagnosis not present

## 2018-09-26 DIAGNOSIS — G301 Alzheimer's disease with late onset: Secondary | ICD-10-CM | POA: Diagnosis not present

## 2018-09-26 DIAGNOSIS — F330 Major depressive disorder, recurrent, mild: Secondary | ICD-10-CM | POA: Diagnosis not present

## 2018-09-26 DIAGNOSIS — G4701 Insomnia due to medical condition: Secondary | ICD-10-CM | POA: Diagnosis not present

## 2018-09-26 DIAGNOSIS — F0280 Dementia in other diseases classified elsewhere without behavioral disturbance: Secondary | ICD-10-CM | POA: Diagnosis not present

## 2018-09-29 DIAGNOSIS — M6281 Muscle weakness (generalized): Secondary | ICD-10-CM | POA: Diagnosis not present

## 2018-09-29 DIAGNOSIS — G309 Alzheimer's disease, unspecified: Secondary | ICD-10-CM | POA: Diagnosis not present

## 2018-09-29 DIAGNOSIS — R32 Unspecified urinary incontinence: Secondary | ICD-10-CM | POA: Diagnosis not present

## 2018-09-29 DIAGNOSIS — S72401D Unspecified fracture of lower end of right femur, subsequent encounter for closed fracture with routine healing: Secondary | ICD-10-CM | POA: Diagnosis not present

## 2018-09-29 DIAGNOSIS — Z9181 History of falling: Secondary | ICD-10-CM | POA: Diagnosis not present

## 2018-09-29 DIAGNOSIS — J45909 Unspecified asthma, uncomplicated: Secondary | ICD-10-CM | POA: Diagnosis not present

## 2018-09-29 DIAGNOSIS — J449 Chronic obstructive pulmonary disease, unspecified: Secondary | ICD-10-CM | POA: Diagnosis not present

## 2018-09-29 DIAGNOSIS — S82892D Other fracture of left lower leg, subsequent encounter for closed fracture with routine healing: Secondary | ICD-10-CM | POA: Diagnosis not present

## 2018-09-29 DIAGNOSIS — R279 Unspecified lack of coordination: Secondary | ICD-10-CM | POA: Diagnosis not present

## 2018-10-02 DIAGNOSIS — Z9181 History of falling: Secondary | ICD-10-CM | POA: Diagnosis not present

## 2018-10-02 DIAGNOSIS — J449 Chronic obstructive pulmonary disease, unspecified: Secondary | ICD-10-CM | POA: Diagnosis not present

## 2018-10-02 DIAGNOSIS — J45909 Unspecified asthma, uncomplicated: Secondary | ICD-10-CM | POA: Diagnosis not present

## 2018-10-02 DIAGNOSIS — G309 Alzheimer's disease, unspecified: Secondary | ICD-10-CM | POA: Diagnosis not present

## 2018-10-02 DIAGNOSIS — M6281 Muscle weakness (generalized): Secondary | ICD-10-CM | POA: Diagnosis not present

## 2018-10-02 DIAGNOSIS — R279 Unspecified lack of coordination: Secondary | ICD-10-CM | POA: Diagnosis not present

## 2018-10-02 DIAGNOSIS — S72401D Unspecified fracture of lower end of right femur, subsequent encounter for closed fracture with routine healing: Secondary | ICD-10-CM | POA: Diagnosis not present

## 2018-10-02 DIAGNOSIS — S82892D Other fracture of left lower leg, subsequent encounter for closed fracture with routine healing: Secondary | ICD-10-CM | POA: Diagnosis not present

## 2018-10-02 DIAGNOSIS — R32 Unspecified urinary incontinence: Secondary | ICD-10-CM | POA: Diagnosis not present

## 2018-10-03 DIAGNOSIS — J45909 Unspecified asthma, uncomplicated: Secondary | ICD-10-CM | POA: Diagnosis not present

## 2018-10-03 DIAGNOSIS — Z9181 History of falling: Secondary | ICD-10-CM | POA: Diagnosis not present

## 2018-10-03 DIAGNOSIS — S72401D Unspecified fracture of lower end of right femur, subsequent encounter for closed fracture with routine healing: Secondary | ICD-10-CM | POA: Diagnosis not present

## 2018-10-03 DIAGNOSIS — S82892D Other fracture of left lower leg, subsequent encounter for closed fracture with routine healing: Secondary | ICD-10-CM | POA: Diagnosis not present

## 2018-10-03 DIAGNOSIS — M6281 Muscle weakness (generalized): Secondary | ICD-10-CM | POA: Diagnosis not present

## 2018-10-03 DIAGNOSIS — R32 Unspecified urinary incontinence: Secondary | ICD-10-CM | POA: Diagnosis not present

## 2018-10-03 DIAGNOSIS — R279 Unspecified lack of coordination: Secondary | ICD-10-CM | POA: Diagnosis not present

## 2018-10-03 DIAGNOSIS — G309 Alzheimer's disease, unspecified: Secondary | ICD-10-CM | POA: Diagnosis not present

## 2018-10-03 DIAGNOSIS — J449 Chronic obstructive pulmonary disease, unspecified: Secondary | ICD-10-CM | POA: Diagnosis not present

## 2018-10-04 DIAGNOSIS — J449 Chronic obstructive pulmonary disease, unspecified: Secondary | ICD-10-CM | POA: Diagnosis not present

## 2018-10-04 DIAGNOSIS — Z9181 History of falling: Secondary | ICD-10-CM | POA: Diagnosis not present

## 2018-10-04 DIAGNOSIS — J45909 Unspecified asthma, uncomplicated: Secondary | ICD-10-CM | POA: Diagnosis not present

## 2018-10-04 DIAGNOSIS — R279 Unspecified lack of coordination: Secondary | ICD-10-CM | POA: Diagnosis not present

## 2018-10-04 DIAGNOSIS — S82892D Other fracture of left lower leg, subsequent encounter for closed fracture with routine healing: Secondary | ICD-10-CM | POA: Diagnosis not present

## 2018-10-04 DIAGNOSIS — G309 Alzheimer's disease, unspecified: Secondary | ICD-10-CM | POA: Diagnosis not present

## 2018-10-04 DIAGNOSIS — R918 Other nonspecific abnormal finding of lung field: Secondary | ICD-10-CM | POA: Diagnosis not present

## 2018-10-04 DIAGNOSIS — S72401D Unspecified fracture of lower end of right femur, subsequent encounter for closed fracture with routine healing: Secondary | ICD-10-CM | POA: Diagnosis not present

## 2018-10-04 DIAGNOSIS — R32 Unspecified urinary incontinence: Secondary | ICD-10-CM | POA: Diagnosis not present

## 2018-10-04 DIAGNOSIS — M6281 Muscle weakness (generalized): Secondary | ICD-10-CM | POA: Diagnosis not present

## 2018-10-05 DIAGNOSIS — J45909 Unspecified asthma, uncomplicated: Secondary | ICD-10-CM | POA: Diagnosis not present

## 2018-10-05 DIAGNOSIS — J189 Pneumonia, unspecified organism: Secondary | ICD-10-CM | POA: Diagnosis not present

## 2018-10-05 DIAGNOSIS — J449 Chronic obstructive pulmonary disease, unspecified: Secondary | ICD-10-CM | POA: Diagnosis not present

## 2018-10-05 DIAGNOSIS — S82892D Other fracture of left lower leg, subsequent encounter for closed fracture with routine healing: Secondary | ICD-10-CM | POA: Diagnosis not present

## 2018-10-05 DIAGNOSIS — M6281 Muscle weakness (generalized): Secondary | ICD-10-CM | POA: Diagnosis not present

## 2018-10-05 DIAGNOSIS — G309 Alzheimer's disease, unspecified: Secondary | ICD-10-CM | POA: Diagnosis not present

## 2018-10-05 DIAGNOSIS — S72401D Unspecified fracture of lower end of right femur, subsequent encounter for closed fracture with routine healing: Secondary | ICD-10-CM | POA: Diagnosis not present

## 2018-10-05 DIAGNOSIS — Z9181 History of falling: Secondary | ICD-10-CM | POA: Diagnosis not present

## 2018-10-05 DIAGNOSIS — R279 Unspecified lack of coordination: Secondary | ICD-10-CM | POA: Diagnosis not present

## 2018-10-05 DIAGNOSIS — R32 Unspecified urinary incontinence: Secondary | ICD-10-CM | POA: Diagnosis not present

## 2018-10-07 DIAGNOSIS — S72401D Unspecified fracture of lower end of right femur, subsequent encounter for closed fracture with routine healing: Secondary | ICD-10-CM | POA: Diagnosis not present

## 2018-10-07 DIAGNOSIS — G309 Alzheimer's disease, unspecified: Secondary | ICD-10-CM | POA: Diagnosis not present

## 2018-10-07 DIAGNOSIS — R279 Unspecified lack of coordination: Secondary | ICD-10-CM | POA: Diagnosis not present

## 2018-10-07 DIAGNOSIS — J449 Chronic obstructive pulmonary disease, unspecified: Secondary | ICD-10-CM | POA: Diagnosis not present

## 2018-10-07 DIAGNOSIS — M6281 Muscle weakness (generalized): Secondary | ICD-10-CM | POA: Diagnosis not present

## 2018-10-07 DIAGNOSIS — Z9181 History of falling: Secondary | ICD-10-CM | POA: Diagnosis not present

## 2018-10-07 DIAGNOSIS — R32 Unspecified urinary incontinence: Secondary | ICD-10-CM | POA: Diagnosis not present

## 2018-10-07 DIAGNOSIS — J45909 Unspecified asthma, uncomplicated: Secondary | ICD-10-CM | POA: Diagnosis not present

## 2018-10-07 DIAGNOSIS — S82892D Other fracture of left lower leg, subsequent encounter for closed fracture with routine healing: Secondary | ICD-10-CM | POA: Diagnosis not present

## 2018-10-09 DIAGNOSIS — R279 Unspecified lack of coordination: Secondary | ICD-10-CM | POA: Diagnosis not present

## 2018-10-09 DIAGNOSIS — Z9181 History of falling: Secondary | ICD-10-CM | POA: Diagnosis not present

## 2018-10-09 DIAGNOSIS — J45909 Unspecified asthma, uncomplicated: Secondary | ICD-10-CM | POA: Diagnosis not present

## 2018-10-09 DIAGNOSIS — S82892D Other fracture of left lower leg, subsequent encounter for closed fracture with routine healing: Secondary | ICD-10-CM | POA: Diagnosis not present

## 2018-10-09 DIAGNOSIS — S72401D Unspecified fracture of lower end of right femur, subsequent encounter for closed fracture with routine healing: Secondary | ICD-10-CM | POA: Diagnosis not present

## 2018-10-09 DIAGNOSIS — R32 Unspecified urinary incontinence: Secondary | ICD-10-CM | POA: Diagnosis not present

## 2018-10-09 DIAGNOSIS — M6281 Muscle weakness (generalized): Secondary | ICD-10-CM | POA: Diagnosis not present

## 2018-10-09 DIAGNOSIS — J449 Chronic obstructive pulmonary disease, unspecified: Secondary | ICD-10-CM | POA: Diagnosis not present

## 2018-10-09 DIAGNOSIS — G309 Alzheimer's disease, unspecified: Secondary | ICD-10-CM | POA: Diagnosis not present

## 2018-10-10 DIAGNOSIS — S82892D Other fracture of left lower leg, subsequent encounter for closed fracture with routine healing: Secondary | ICD-10-CM | POA: Diagnosis not present

## 2018-10-10 DIAGNOSIS — J449 Chronic obstructive pulmonary disease, unspecified: Secondary | ICD-10-CM | POA: Diagnosis not present

## 2018-10-10 DIAGNOSIS — J45909 Unspecified asthma, uncomplicated: Secondary | ICD-10-CM | POA: Diagnosis not present

## 2018-10-10 DIAGNOSIS — R32 Unspecified urinary incontinence: Secondary | ICD-10-CM | POA: Diagnosis not present

## 2018-10-10 DIAGNOSIS — M6281 Muscle weakness (generalized): Secondary | ICD-10-CM | POA: Diagnosis not present

## 2018-10-10 DIAGNOSIS — S72401D Unspecified fracture of lower end of right femur, subsequent encounter for closed fracture with routine healing: Secondary | ICD-10-CM | POA: Diagnosis not present

## 2018-10-10 DIAGNOSIS — Z9181 History of falling: Secondary | ICD-10-CM | POA: Diagnosis not present

## 2018-10-10 DIAGNOSIS — G309 Alzheimer's disease, unspecified: Secondary | ICD-10-CM | POA: Diagnosis not present

## 2018-10-10 DIAGNOSIS — G894 Chronic pain syndrome: Secondary | ICD-10-CM | POA: Diagnosis not present

## 2018-10-10 DIAGNOSIS — R279 Unspecified lack of coordination: Secondary | ICD-10-CM | POA: Diagnosis not present

## 2018-10-11 DIAGNOSIS — R279 Unspecified lack of coordination: Secondary | ICD-10-CM | POA: Diagnosis not present

## 2018-10-11 DIAGNOSIS — G309 Alzheimer's disease, unspecified: Secondary | ICD-10-CM | POA: Diagnosis not present

## 2018-10-11 DIAGNOSIS — M6281 Muscle weakness (generalized): Secondary | ICD-10-CM | POA: Diagnosis not present

## 2018-10-11 DIAGNOSIS — R32 Unspecified urinary incontinence: Secondary | ICD-10-CM | POA: Diagnosis not present

## 2018-10-11 DIAGNOSIS — J45909 Unspecified asthma, uncomplicated: Secondary | ICD-10-CM | POA: Diagnosis not present

## 2018-10-11 DIAGNOSIS — S82892D Other fracture of left lower leg, subsequent encounter for closed fracture with routine healing: Secondary | ICD-10-CM | POA: Diagnosis not present

## 2018-10-11 DIAGNOSIS — S72401D Unspecified fracture of lower end of right femur, subsequent encounter for closed fracture with routine healing: Secondary | ICD-10-CM | POA: Diagnosis not present

## 2018-10-11 DIAGNOSIS — J449 Chronic obstructive pulmonary disease, unspecified: Secondary | ICD-10-CM | POA: Diagnosis not present

## 2018-10-11 DIAGNOSIS — Z9181 History of falling: Secondary | ICD-10-CM | POA: Diagnosis not present

## 2018-10-12 DIAGNOSIS — R279 Unspecified lack of coordination: Secondary | ICD-10-CM | POA: Diagnosis not present

## 2018-10-12 DIAGNOSIS — S72401D Unspecified fracture of lower end of right femur, subsequent encounter for closed fracture with routine healing: Secondary | ICD-10-CM | POA: Diagnosis not present

## 2018-10-12 DIAGNOSIS — G309 Alzheimer's disease, unspecified: Secondary | ICD-10-CM | POA: Diagnosis not present

## 2018-10-12 DIAGNOSIS — S82892D Other fracture of left lower leg, subsequent encounter for closed fracture with routine healing: Secondary | ICD-10-CM | POA: Diagnosis not present

## 2018-10-12 DIAGNOSIS — J449 Chronic obstructive pulmonary disease, unspecified: Secondary | ICD-10-CM | POA: Diagnosis not present

## 2018-10-12 DIAGNOSIS — Z9181 History of falling: Secondary | ICD-10-CM | POA: Diagnosis not present

## 2018-10-12 DIAGNOSIS — M6281 Muscle weakness (generalized): Secondary | ICD-10-CM | POA: Diagnosis not present

## 2018-10-12 DIAGNOSIS — R32 Unspecified urinary incontinence: Secondary | ICD-10-CM | POA: Diagnosis not present

## 2018-10-12 DIAGNOSIS — J45909 Unspecified asthma, uncomplicated: Secondary | ICD-10-CM | POA: Diagnosis not present

## 2018-10-13 DIAGNOSIS — J45909 Unspecified asthma, uncomplicated: Secondary | ICD-10-CM | POA: Diagnosis not present

## 2018-10-13 DIAGNOSIS — M6281 Muscle weakness (generalized): Secondary | ICD-10-CM | POA: Diagnosis not present

## 2018-10-13 DIAGNOSIS — Z9181 History of falling: Secondary | ICD-10-CM | POA: Diagnosis not present

## 2018-10-13 DIAGNOSIS — G309 Alzheimer's disease, unspecified: Secondary | ICD-10-CM | POA: Diagnosis not present

## 2018-10-13 DIAGNOSIS — S82892D Other fracture of left lower leg, subsequent encounter for closed fracture with routine healing: Secondary | ICD-10-CM | POA: Diagnosis not present

## 2018-10-13 DIAGNOSIS — J449 Chronic obstructive pulmonary disease, unspecified: Secondary | ICD-10-CM | POA: Diagnosis not present

## 2018-10-13 DIAGNOSIS — R32 Unspecified urinary incontinence: Secondary | ICD-10-CM | POA: Diagnosis not present

## 2018-10-13 DIAGNOSIS — S72401D Unspecified fracture of lower end of right femur, subsequent encounter for closed fracture with routine healing: Secondary | ICD-10-CM | POA: Diagnosis not present

## 2018-10-13 DIAGNOSIS — R279 Unspecified lack of coordination: Secondary | ICD-10-CM | POA: Diagnosis not present

## 2018-10-16 DIAGNOSIS — Z9181 History of falling: Secondary | ICD-10-CM | POA: Diagnosis not present

## 2018-10-16 DIAGNOSIS — J45909 Unspecified asthma, uncomplicated: Secondary | ICD-10-CM | POA: Diagnosis not present

## 2018-10-16 DIAGNOSIS — F419 Anxiety disorder, unspecified: Secondary | ICD-10-CM | POA: Diagnosis not present

## 2018-10-16 DIAGNOSIS — R54 Age-related physical debility: Secondary | ICD-10-CM | POA: Diagnosis not present

## 2018-10-16 DIAGNOSIS — G8929 Other chronic pain: Secondary | ICD-10-CM | POA: Diagnosis not present

## 2018-10-16 DIAGNOSIS — D638 Anemia in other chronic diseases classified elsewhere: Secondary | ICD-10-CM | POA: Diagnosis not present

## 2018-10-16 DIAGNOSIS — F0150 Vascular dementia without behavioral disturbance: Secondary | ICD-10-CM | POA: Diagnosis not present

## 2018-10-16 DIAGNOSIS — K5900 Constipation, unspecified: Secondary | ICD-10-CM | POA: Diagnosis not present

## 2018-10-16 DIAGNOSIS — S82892D Other fracture of left lower leg, subsequent encounter for closed fracture with routine healing: Secondary | ICD-10-CM | POA: Diagnosis not present

## 2018-10-16 DIAGNOSIS — J449 Chronic obstructive pulmonary disease, unspecified: Secondary | ICD-10-CM | POA: Diagnosis not present

## 2018-10-16 DIAGNOSIS — R279 Unspecified lack of coordination: Secondary | ICD-10-CM | POA: Diagnosis not present

## 2018-10-16 DIAGNOSIS — M6281 Muscle weakness (generalized): Secondary | ICD-10-CM | POA: Diagnosis not present

## 2018-10-16 DIAGNOSIS — G309 Alzheimer's disease, unspecified: Secondary | ICD-10-CM | POA: Diagnosis not present

## 2018-10-16 DIAGNOSIS — F339 Major depressive disorder, recurrent, unspecified: Secondary | ICD-10-CM | POA: Diagnosis not present

## 2018-10-16 DIAGNOSIS — J159 Unspecified bacterial pneumonia: Secondary | ICD-10-CM | POA: Diagnosis not present

## 2018-10-16 DIAGNOSIS — R32 Unspecified urinary incontinence: Secondary | ICD-10-CM | POA: Diagnosis not present

## 2018-10-16 DIAGNOSIS — S72401D Unspecified fracture of lower end of right femur, subsequent encounter for closed fracture with routine healing: Secondary | ICD-10-CM | POA: Diagnosis not present

## 2018-10-17 DIAGNOSIS — M6281 Muscle weakness (generalized): Secondary | ICD-10-CM | POA: Diagnosis not present

## 2018-10-17 DIAGNOSIS — Z9181 History of falling: Secondary | ICD-10-CM | POA: Diagnosis not present

## 2018-10-17 DIAGNOSIS — G309 Alzheimer's disease, unspecified: Secondary | ICD-10-CM | POA: Diagnosis not present

## 2018-10-17 DIAGNOSIS — J45909 Unspecified asthma, uncomplicated: Secondary | ICD-10-CM | POA: Diagnosis not present

## 2018-10-17 DIAGNOSIS — S82892D Other fracture of left lower leg, subsequent encounter for closed fracture with routine healing: Secondary | ICD-10-CM | POA: Diagnosis not present

## 2018-10-17 DIAGNOSIS — R279 Unspecified lack of coordination: Secondary | ICD-10-CM | POA: Diagnosis not present

## 2018-10-17 DIAGNOSIS — S72401D Unspecified fracture of lower end of right femur, subsequent encounter for closed fracture with routine healing: Secondary | ICD-10-CM | POA: Diagnosis not present

## 2018-10-17 DIAGNOSIS — R32 Unspecified urinary incontinence: Secondary | ICD-10-CM | POA: Diagnosis not present

## 2018-10-17 DIAGNOSIS — J449 Chronic obstructive pulmonary disease, unspecified: Secondary | ICD-10-CM | POA: Diagnosis not present

## 2018-10-18 DIAGNOSIS — S82892D Other fracture of left lower leg, subsequent encounter for closed fracture with routine healing: Secondary | ICD-10-CM | POA: Diagnosis not present

## 2018-10-18 DIAGNOSIS — J449 Chronic obstructive pulmonary disease, unspecified: Secondary | ICD-10-CM | POA: Diagnosis not present

## 2018-10-18 DIAGNOSIS — M6281 Muscle weakness (generalized): Secondary | ICD-10-CM | POA: Diagnosis not present

## 2018-10-18 DIAGNOSIS — R279 Unspecified lack of coordination: Secondary | ICD-10-CM | POA: Diagnosis not present

## 2018-10-18 DIAGNOSIS — J45909 Unspecified asthma, uncomplicated: Secondary | ICD-10-CM | POA: Diagnosis not present

## 2018-10-18 DIAGNOSIS — S72401D Unspecified fracture of lower end of right femur, subsequent encounter for closed fracture with routine healing: Secondary | ICD-10-CM | POA: Diagnosis not present

## 2018-10-18 DIAGNOSIS — Z9181 History of falling: Secondary | ICD-10-CM | POA: Diagnosis not present

## 2018-10-18 DIAGNOSIS — R32 Unspecified urinary incontinence: Secondary | ICD-10-CM | POA: Diagnosis not present

## 2018-10-18 DIAGNOSIS — G309 Alzheimer's disease, unspecified: Secondary | ICD-10-CM | POA: Diagnosis not present

## 2018-10-19 DIAGNOSIS — N179 Acute kidney failure, unspecified: Secondary | ICD-10-CM | POA: Diagnosis not present

## 2018-10-19 DIAGNOSIS — S72431A Displaced fracture of medial condyle of right femur, initial encounter for closed fracture: Secondary | ICD-10-CM | POA: Diagnosis not present

## 2018-10-19 DIAGNOSIS — J45909 Unspecified asthma, uncomplicated: Secondary | ICD-10-CM | POA: Diagnosis not present

## 2018-10-19 DIAGNOSIS — S92341A Displaced fracture of fourth metatarsal bone, right foot, initial encounter for closed fracture: Secondary | ICD-10-CM | POA: Diagnosis not present

## 2018-10-19 DIAGNOSIS — R279 Unspecified lack of coordination: Secondary | ICD-10-CM | POA: Diagnosis not present

## 2018-10-19 DIAGNOSIS — Z9181 History of falling: Secondary | ICD-10-CM | POA: Diagnosis not present

## 2018-10-19 DIAGNOSIS — Z7409 Other reduced mobility: Secondary | ICD-10-CM | POA: Diagnosis not present

## 2018-10-19 DIAGNOSIS — R32 Unspecified urinary incontinence: Secondary | ICD-10-CM | POA: Diagnosis not present

## 2018-10-19 DIAGNOSIS — S82402A Unspecified fracture of shaft of left fibula, initial encounter for closed fracture: Secondary | ICD-10-CM | POA: Diagnosis not present

## 2018-10-19 DIAGNOSIS — J159 Unspecified bacterial pneumonia: Secondary | ICD-10-CM | POA: Diagnosis not present

## 2018-10-19 DIAGNOSIS — M6281 Muscle weakness (generalized): Secondary | ICD-10-CM | POA: Diagnosis not present

## 2018-10-19 DIAGNOSIS — S72401D Unspecified fracture of lower end of right femur, subsequent encounter for closed fracture with routine healing: Secondary | ICD-10-CM | POA: Diagnosis not present

## 2018-10-19 DIAGNOSIS — R296 Repeated falls: Secondary | ICD-10-CM | POA: Diagnosis not present

## 2018-10-19 DIAGNOSIS — S82892D Other fracture of left lower leg, subsequent encounter for closed fracture with routine healing: Secondary | ICD-10-CM | POA: Diagnosis not present

## 2018-10-19 DIAGNOSIS — G309 Alzheimer's disease, unspecified: Secondary | ICD-10-CM | POA: Diagnosis not present

## 2018-10-19 DIAGNOSIS — J449 Chronic obstructive pulmonary disease, unspecified: Secondary | ICD-10-CM | POA: Diagnosis not present

## 2018-10-20 DIAGNOSIS — Z9181 History of falling: Secondary | ICD-10-CM | POA: Diagnosis not present

## 2018-10-20 DIAGNOSIS — M6281 Muscle weakness (generalized): Secondary | ICD-10-CM | POA: Diagnosis not present

## 2018-10-20 DIAGNOSIS — R32 Unspecified urinary incontinence: Secondary | ICD-10-CM | POA: Diagnosis not present

## 2018-10-20 DIAGNOSIS — G309 Alzheimer's disease, unspecified: Secondary | ICD-10-CM | POA: Diagnosis not present

## 2018-10-20 DIAGNOSIS — J45909 Unspecified asthma, uncomplicated: Secondary | ICD-10-CM | POA: Diagnosis not present

## 2018-10-20 DIAGNOSIS — R279 Unspecified lack of coordination: Secondary | ICD-10-CM | POA: Diagnosis not present

## 2018-10-20 DIAGNOSIS — S72401D Unspecified fracture of lower end of right femur, subsequent encounter for closed fracture with routine healing: Secondary | ICD-10-CM | POA: Diagnosis not present

## 2018-10-20 DIAGNOSIS — S82892D Other fracture of left lower leg, subsequent encounter for closed fracture with routine healing: Secondary | ICD-10-CM | POA: Diagnosis not present

## 2018-10-20 DIAGNOSIS — J449 Chronic obstructive pulmonary disease, unspecified: Secondary | ICD-10-CM | POA: Diagnosis not present

## 2018-10-23 DIAGNOSIS — R279 Unspecified lack of coordination: Secondary | ICD-10-CM | POA: Diagnosis not present

## 2018-10-23 DIAGNOSIS — G309 Alzheimer's disease, unspecified: Secondary | ICD-10-CM | POA: Diagnosis not present

## 2018-10-23 DIAGNOSIS — Z9181 History of falling: Secondary | ICD-10-CM | POA: Diagnosis not present

## 2018-10-23 DIAGNOSIS — J449 Chronic obstructive pulmonary disease, unspecified: Secondary | ICD-10-CM | POA: Diagnosis not present

## 2018-10-23 DIAGNOSIS — M6281 Muscle weakness (generalized): Secondary | ICD-10-CM | POA: Diagnosis not present

## 2018-10-23 DIAGNOSIS — S82892D Other fracture of left lower leg, subsequent encounter for closed fracture with routine healing: Secondary | ICD-10-CM | POA: Diagnosis not present

## 2018-10-23 DIAGNOSIS — R32 Unspecified urinary incontinence: Secondary | ICD-10-CM | POA: Diagnosis not present

## 2018-10-23 DIAGNOSIS — S72401D Unspecified fracture of lower end of right femur, subsequent encounter for closed fracture with routine healing: Secondary | ICD-10-CM | POA: Diagnosis not present

## 2018-10-23 DIAGNOSIS — J45909 Unspecified asthma, uncomplicated: Secondary | ICD-10-CM | POA: Diagnosis not present

## 2018-10-24 DIAGNOSIS — J45909 Unspecified asthma, uncomplicated: Secondary | ICD-10-CM | POA: Diagnosis not present

## 2018-10-24 DIAGNOSIS — S82301D Unspecified fracture of lower end of right tibia, subsequent encounter for closed fracture with routine healing: Secondary | ICD-10-CM | POA: Diagnosis not present

## 2018-10-24 DIAGNOSIS — J449 Chronic obstructive pulmonary disease, unspecified: Secondary | ICD-10-CM | POA: Diagnosis not present

## 2018-10-24 DIAGNOSIS — S81801A Unspecified open wound, right lower leg, initial encounter: Secondary | ICD-10-CM | POA: Diagnosis not present

## 2018-10-24 DIAGNOSIS — F330 Major depressive disorder, recurrent, mild: Secondary | ICD-10-CM | POA: Diagnosis not present

## 2018-10-24 DIAGNOSIS — R32 Unspecified urinary incontinence: Secondary | ICD-10-CM | POA: Diagnosis not present

## 2018-10-24 DIAGNOSIS — S72401D Unspecified fracture of lower end of right femur, subsequent encounter for closed fracture with routine healing: Secondary | ICD-10-CM | POA: Diagnosis not present

## 2018-10-24 DIAGNOSIS — M6281 Muscle weakness (generalized): Secondary | ICD-10-CM | POA: Diagnosis not present

## 2018-10-24 DIAGNOSIS — G309 Alzheimer's disease, unspecified: Secondary | ICD-10-CM | POA: Diagnosis not present

## 2018-10-24 DIAGNOSIS — Z9181 History of falling: Secondary | ICD-10-CM | POA: Diagnosis not present

## 2018-10-24 DIAGNOSIS — F0280 Dementia in other diseases classified elsewhere without behavioral disturbance: Secondary | ICD-10-CM | POA: Diagnosis not present

## 2018-10-24 DIAGNOSIS — G301 Alzheimer's disease with late onset: Secondary | ICD-10-CM | POA: Diagnosis not present

## 2018-10-24 DIAGNOSIS — S82832A Other fracture of upper and lower end of left fibula, initial encounter for closed fracture: Secondary | ICD-10-CM | POA: Diagnosis not present

## 2018-10-24 DIAGNOSIS — R279 Unspecified lack of coordination: Secondary | ICD-10-CM | POA: Diagnosis not present

## 2018-10-24 DIAGNOSIS — G4701 Insomnia due to medical condition: Secondary | ICD-10-CM | POA: Diagnosis not present

## 2018-10-24 DIAGNOSIS — S82892D Other fracture of left lower leg, subsequent encounter for closed fracture with routine healing: Secondary | ICD-10-CM | POA: Diagnosis not present

## 2018-10-25 DIAGNOSIS — R279 Unspecified lack of coordination: Secondary | ICD-10-CM | POA: Diagnosis not present

## 2018-10-25 DIAGNOSIS — M6281 Muscle weakness (generalized): Secondary | ICD-10-CM | POA: Diagnosis not present

## 2018-10-25 DIAGNOSIS — S72401D Unspecified fracture of lower end of right femur, subsequent encounter for closed fracture with routine healing: Secondary | ICD-10-CM | POA: Diagnosis not present

## 2018-10-25 DIAGNOSIS — S82892D Other fracture of left lower leg, subsequent encounter for closed fracture with routine healing: Secondary | ICD-10-CM | POA: Diagnosis not present

## 2018-10-25 DIAGNOSIS — Z9181 History of falling: Secondary | ICD-10-CM | POA: Diagnosis not present

## 2018-10-25 DIAGNOSIS — G309 Alzheimer's disease, unspecified: Secondary | ICD-10-CM | POA: Diagnosis not present

## 2018-10-25 DIAGNOSIS — R32 Unspecified urinary incontinence: Secondary | ICD-10-CM | POA: Diagnosis not present

## 2018-10-25 DIAGNOSIS — J449 Chronic obstructive pulmonary disease, unspecified: Secondary | ICD-10-CM | POA: Diagnosis not present

## 2018-10-25 DIAGNOSIS — J45909 Unspecified asthma, uncomplicated: Secondary | ICD-10-CM | POA: Diagnosis not present

## 2018-10-26 DIAGNOSIS — S82892D Other fracture of left lower leg, subsequent encounter for closed fracture with routine healing: Secondary | ICD-10-CM | POA: Diagnosis not present

## 2018-10-26 DIAGNOSIS — R32 Unspecified urinary incontinence: Secondary | ICD-10-CM | POA: Diagnosis not present

## 2018-10-26 DIAGNOSIS — J45909 Unspecified asthma, uncomplicated: Secondary | ICD-10-CM | POA: Diagnosis not present

## 2018-10-26 DIAGNOSIS — G309 Alzheimer's disease, unspecified: Secondary | ICD-10-CM | POA: Diagnosis not present

## 2018-10-26 DIAGNOSIS — G8911 Acute pain due to trauma: Secondary | ICD-10-CM | POA: Diagnosis not present

## 2018-10-26 DIAGNOSIS — S92341A Displaced fracture of fourth metatarsal bone, right foot, initial encounter for closed fracture: Secondary | ICD-10-CM | POA: Diagnosis not present

## 2018-10-26 DIAGNOSIS — R279 Unspecified lack of coordination: Secondary | ICD-10-CM | POA: Diagnosis not present

## 2018-10-26 DIAGNOSIS — S82402A Unspecified fracture of shaft of left fibula, initial encounter for closed fracture: Secondary | ICD-10-CM | POA: Diagnosis not present

## 2018-10-26 DIAGNOSIS — J449 Chronic obstructive pulmonary disease, unspecified: Secondary | ICD-10-CM | POA: Diagnosis not present

## 2018-10-26 DIAGNOSIS — Z9181 History of falling: Secondary | ICD-10-CM | POA: Diagnosis not present

## 2018-10-26 DIAGNOSIS — M6281 Muscle weakness (generalized): Secondary | ICD-10-CM | POA: Diagnosis not present

## 2018-10-26 DIAGNOSIS — S72401D Unspecified fracture of lower end of right femur, subsequent encounter for closed fracture with routine healing: Secondary | ICD-10-CM | POA: Diagnosis not present

## 2018-10-27 DIAGNOSIS — S82892D Other fracture of left lower leg, subsequent encounter for closed fracture with routine healing: Secondary | ICD-10-CM | POA: Diagnosis not present

## 2018-10-27 DIAGNOSIS — J45909 Unspecified asthma, uncomplicated: Secondary | ICD-10-CM | POA: Diagnosis not present

## 2018-10-27 DIAGNOSIS — G309 Alzheimer's disease, unspecified: Secondary | ICD-10-CM | POA: Diagnosis not present

## 2018-10-27 DIAGNOSIS — R32 Unspecified urinary incontinence: Secondary | ICD-10-CM | POA: Diagnosis not present

## 2018-10-27 DIAGNOSIS — R279 Unspecified lack of coordination: Secondary | ICD-10-CM | POA: Diagnosis not present

## 2018-10-27 DIAGNOSIS — Z9181 History of falling: Secondary | ICD-10-CM | POA: Diagnosis not present

## 2018-10-27 DIAGNOSIS — S72401D Unspecified fracture of lower end of right femur, subsequent encounter for closed fracture with routine healing: Secondary | ICD-10-CM | POA: Diagnosis not present

## 2018-10-27 DIAGNOSIS — M6281 Muscle weakness (generalized): Secondary | ICD-10-CM | POA: Diagnosis not present

## 2018-10-27 DIAGNOSIS — J449 Chronic obstructive pulmonary disease, unspecified: Secondary | ICD-10-CM | POA: Diagnosis not present

## 2018-10-30 DIAGNOSIS — J45909 Unspecified asthma, uncomplicated: Secondary | ICD-10-CM | POA: Diagnosis not present

## 2018-10-30 DIAGNOSIS — R32 Unspecified urinary incontinence: Secondary | ICD-10-CM | POA: Diagnosis not present

## 2018-10-30 DIAGNOSIS — M6281 Muscle weakness (generalized): Secondary | ICD-10-CM | POA: Diagnosis not present

## 2018-10-30 DIAGNOSIS — J449 Chronic obstructive pulmonary disease, unspecified: Secondary | ICD-10-CM | POA: Diagnosis not present

## 2018-10-30 DIAGNOSIS — S82892D Other fracture of left lower leg, subsequent encounter for closed fracture with routine healing: Secondary | ICD-10-CM | POA: Diagnosis not present

## 2018-10-30 DIAGNOSIS — G309 Alzheimer's disease, unspecified: Secondary | ICD-10-CM | POA: Diagnosis not present

## 2018-10-30 DIAGNOSIS — Z9181 History of falling: Secondary | ICD-10-CM | POA: Diagnosis not present

## 2018-10-30 DIAGNOSIS — S72401D Unspecified fracture of lower end of right femur, subsequent encounter for closed fracture with routine healing: Secondary | ICD-10-CM | POA: Diagnosis not present

## 2018-10-30 DIAGNOSIS — R279 Unspecified lack of coordination: Secondary | ICD-10-CM | POA: Diagnosis not present

## 2018-10-31 DIAGNOSIS — Z9181 History of falling: Secondary | ICD-10-CM | POA: Diagnosis not present

## 2018-10-31 DIAGNOSIS — R279 Unspecified lack of coordination: Secondary | ICD-10-CM | POA: Diagnosis not present

## 2018-10-31 DIAGNOSIS — G309 Alzheimer's disease, unspecified: Secondary | ICD-10-CM | POA: Diagnosis not present

## 2018-10-31 DIAGNOSIS — S82892D Other fracture of left lower leg, subsequent encounter for closed fracture with routine healing: Secondary | ICD-10-CM | POA: Diagnosis not present

## 2018-10-31 DIAGNOSIS — M6281 Muscle weakness (generalized): Secondary | ICD-10-CM | POA: Diagnosis not present

## 2018-10-31 DIAGNOSIS — R32 Unspecified urinary incontinence: Secondary | ICD-10-CM | POA: Diagnosis not present

## 2018-10-31 DIAGNOSIS — J449 Chronic obstructive pulmonary disease, unspecified: Secondary | ICD-10-CM | POA: Diagnosis not present

## 2018-10-31 DIAGNOSIS — J45909 Unspecified asthma, uncomplicated: Secondary | ICD-10-CM | POA: Diagnosis not present

## 2018-10-31 DIAGNOSIS — S72401D Unspecified fracture of lower end of right femur, subsequent encounter for closed fracture with routine healing: Secondary | ICD-10-CM | POA: Diagnosis not present

## 2018-11-01 DIAGNOSIS — S72401D Unspecified fracture of lower end of right femur, subsequent encounter for closed fracture with routine healing: Secondary | ICD-10-CM | POA: Diagnosis not present

## 2018-11-01 DIAGNOSIS — J449 Chronic obstructive pulmonary disease, unspecified: Secondary | ICD-10-CM | POA: Diagnosis not present

## 2018-11-01 DIAGNOSIS — G309 Alzheimer's disease, unspecified: Secondary | ICD-10-CM | POA: Diagnosis not present

## 2018-11-01 DIAGNOSIS — M6281 Muscle weakness (generalized): Secondary | ICD-10-CM | POA: Diagnosis not present

## 2018-11-01 DIAGNOSIS — Z9181 History of falling: Secondary | ICD-10-CM | POA: Diagnosis not present

## 2018-11-01 DIAGNOSIS — S82892D Other fracture of left lower leg, subsequent encounter for closed fracture with routine healing: Secondary | ICD-10-CM | POA: Diagnosis not present

## 2018-11-01 DIAGNOSIS — R279 Unspecified lack of coordination: Secondary | ICD-10-CM | POA: Diagnosis not present

## 2018-11-01 DIAGNOSIS — R32 Unspecified urinary incontinence: Secondary | ICD-10-CM | POA: Diagnosis not present

## 2018-11-01 DIAGNOSIS — J45909 Unspecified asthma, uncomplicated: Secondary | ICD-10-CM | POA: Diagnosis not present

## 2018-11-02 DIAGNOSIS — M6281 Muscle weakness (generalized): Secondary | ICD-10-CM | POA: Diagnosis not present

## 2018-11-02 DIAGNOSIS — J449 Chronic obstructive pulmonary disease, unspecified: Secondary | ICD-10-CM | POA: Diagnosis not present

## 2018-11-02 DIAGNOSIS — Z9181 History of falling: Secondary | ICD-10-CM | POA: Diagnosis not present

## 2018-11-02 DIAGNOSIS — S72401D Unspecified fracture of lower end of right femur, subsequent encounter for closed fracture with routine healing: Secondary | ICD-10-CM | POA: Diagnosis not present

## 2018-11-02 DIAGNOSIS — J45909 Unspecified asthma, uncomplicated: Secondary | ICD-10-CM | POA: Diagnosis not present

## 2018-11-02 DIAGNOSIS — S82892D Other fracture of left lower leg, subsequent encounter for closed fracture with routine healing: Secondary | ICD-10-CM | POA: Diagnosis not present

## 2018-11-02 DIAGNOSIS — R32 Unspecified urinary incontinence: Secondary | ICD-10-CM | POA: Diagnosis not present

## 2018-11-02 DIAGNOSIS — R279 Unspecified lack of coordination: Secondary | ICD-10-CM | POA: Diagnosis not present

## 2018-11-02 DIAGNOSIS — G309 Alzheimer's disease, unspecified: Secondary | ICD-10-CM | POA: Diagnosis not present

## 2018-11-03 DIAGNOSIS — S72401D Unspecified fracture of lower end of right femur, subsequent encounter for closed fracture with routine healing: Secondary | ICD-10-CM | POA: Diagnosis not present

## 2018-11-03 DIAGNOSIS — S82892D Other fracture of left lower leg, subsequent encounter for closed fracture with routine healing: Secondary | ICD-10-CM | POA: Diagnosis not present

## 2018-11-03 DIAGNOSIS — J45909 Unspecified asthma, uncomplicated: Secondary | ICD-10-CM | POA: Diagnosis not present

## 2018-11-03 DIAGNOSIS — M6281 Muscle weakness (generalized): Secondary | ICD-10-CM | POA: Diagnosis not present

## 2018-11-03 DIAGNOSIS — Z9181 History of falling: Secondary | ICD-10-CM | POA: Diagnosis not present

## 2018-11-03 DIAGNOSIS — J449 Chronic obstructive pulmonary disease, unspecified: Secondary | ICD-10-CM | POA: Diagnosis not present

## 2018-11-03 DIAGNOSIS — R32 Unspecified urinary incontinence: Secondary | ICD-10-CM | POA: Diagnosis not present

## 2018-11-03 DIAGNOSIS — G309 Alzheimer's disease, unspecified: Secondary | ICD-10-CM | POA: Diagnosis not present

## 2018-11-03 DIAGNOSIS — R279 Unspecified lack of coordination: Secondary | ICD-10-CM | POA: Diagnosis not present

## 2018-11-04 DIAGNOSIS — S72401D Unspecified fracture of lower end of right femur, subsequent encounter for closed fracture with routine healing: Secondary | ICD-10-CM | POA: Diagnosis not present

## 2018-11-04 DIAGNOSIS — J45909 Unspecified asthma, uncomplicated: Secondary | ICD-10-CM | POA: Diagnosis not present

## 2018-11-04 DIAGNOSIS — G309 Alzheimer's disease, unspecified: Secondary | ICD-10-CM | POA: Diagnosis not present

## 2018-11-04 DIAGNOSIS — R32 Unspecified urinary incontinence: Secondary | ICD-10-CM | POA: Diagnosis not present

## 2018-11-04 DIAGNOSIS — R279 Unspecified lack of coordination: Secondary | ICD-10-CM | POA: Diagnosis not present

## 2018-11-04 DIAGNOSIS — M6281 Muscle weakness (generalized): Secondary | ICD-10-CM | POA: Diagnosis not present

## 2018-11-04 DIAGNOSIS — S82892D Other fracture of left lower leg, subsequent encounter for closed fracture with routine healing: Secondary | ICD-10-CM | POA: Diagnosis not present

## 2018-11-04 DIAGNOSIS — J449 Chronic obstructive pulmonary disease, unspecified: Secondary | ICD-10-CM | POA: Diagnosis not present

## 2018-11-04 DIAGNOSIS — Z9181 History of falling: Secondary | ICD-10-CM | POA: Diagnosis not present

## 2018-11-06 DIAGNOSIS — R279 Unspecified lack of coordination: Secondary | ICD-10-CM | POA: Diagnosis not present

## 2018-11-06 DIAGNOSIS — R32 Unspecified urinary incontinence: Secondary | ICD-10-CM | POA: Diagnosis not present

## 2018-11-06 DIAGNOSIS — Z9181 History of falling: Secondary | ICD-10-CM | POA: Diagnosis not present

## 2018-11-06 DIAGNOSIS — S72401D Unspecified fracture of lower end of right femur, subsequent encounter for closed fracture with routine healing: Secondary | ICD-10-CM | POA: Diagnosis not present

## 2018-11-06 DIAGNOSIS — J449 Chronic obstructive pulmonary disease, unspecified: Secondary | ICD-10-CM | POA: Diagnosis not present

## 2018-11-06 DIAGNOSIS — J45909 Unspecified asthma, uncomplicated: Secondary | ICD-10-CM | POA: Diagnosis not present

## 2018-11-06 DIAGNOSIS — M6281 Muscle weakness (generalized): Secondary | ICD-10-CM | POA: Diagnosis not present

## 2018-11-06 DIAGNOSIS — G309 Alzheimer's disease, unspecified: Secondary | ICD-10-CM | POA: Diagnosis not present

## 2018-11-06 DIAGNOSIS — S82892D Other fracture of left lower leg, subsequent encounter for closed fracture with routine healing: Secondary | ICD-10-CM | POA: Diagnosis not present

## 2018-11-07 DIAGNOSIS — Z9181 History of falling: Secondary | ICD-10-CM | POA: Diagnosis not present

## 2018-11-07 DIAGNOSIS — J449 Chronic obstructive pulmonary disease, unspecified: Secondary | ICD-10-CM | POA: Diagnosis not present

## 2018-11-07 DIAGNOSIS — S82892D Other fracture of left lower leg, subsequent encounter for closed fracture with routine healing: Secondary | ICD-10-CM | POA: Diagnosis not present

## 2018-11-07 DIAGNOSIS — J45909 Unspecified asthma, uncomplicated: Secondary | ICD-10-CM | POA: Diagnosis not present

## 2018-11-07 DIAGNOSIS — G309 Alzheimer's disease, unspecified: Secondary | ICD-10-CM | POA: Diagnosis not present

## 2018-11-07 DIAGNOSIS — R279 Unspecified lack of coordination: Secondary | ICD-10-CM | POA: Diagnosis not present

## 2018-11-07 DIAGNOSIS — R32 Unspecified urinary incontinence: Secondary | ICD-10-CM | POA: Diagnosis not present

## 2018-11-07 DIAGNOSIS — M6281 Muscle weakness (generalized): Secondary | ICD-10-CM | POA: Diagnosis not present

## 2018-11-07 DIAGNOSIS — S72401D Unspecified fracture of lower end of right femur, subsequent encounter for closed fracture with routine healing: Secondary | ICD-10-CM | POA: Diagnosis not present

## 2018-11-08 DIAGNOSIS — S92332D Displaced fracture of third metatarsal bone, left foot, subsequent encounter for fracture with routine healing: Secondary | ICD-10-CM | POA: Diagnosis not present

## 2018-11-08 DIAGNOSIS — R279 Unspecified lack of coordination: Secondary | ICD-10-CM | POA: Diagnosis not present

## 2018-11-08 DIAGNOSIS — Z9181 History of falling: Secondary | ICD-10-CM | POA: Diagnosis not present

## 2018-11-08 DIAGNOSIS — S82302D Unspecified fracture of lower end of left tibia, subsequent encounter for closed fracture with routine healing: Secondary | ICD-10-CM | POA: Diagnosis not present

## 2018-11-08 DIAGNOSIS — S82252D Displaced comminuted fracture of shaft of left tibia, subsequent encounter for closed fracture with routine healing: Secondary | ICD-10-CM | POA: Insufficient documentation

## 2018-11-08 DIAGNOSIS — M6281 Muscle weakness (generalized): Secondary | ICD-10-CM | POA: Diagnosis not present

## 2018-11-08 DIAGNOSIS — G309 Alzheimer's disease, unspecified: Secondary | ICD-10-CM | POA: Diagnosis not present

## 2018-11-08 DIAGNOSIS — J45909 Unspecified asthma, uncomplicated: Secondary | ICD-10-CM | POA: Diagnosis not present

## 2018-11-08 DIAGNOSIS — S92342D Displaced fracture of fourth metatarsal bone, left foot, subsequent encounter for fracture with routine healing: Secondary | ICD-10-CM | POA: Diagnosis not present

## 2018-11-08 DIAGNOSIS — S72411D Displaced unspecified condyle fracture of lower end of right femur, subsequent encounter for closed fracture with routine healing: Secondary | ICD-10-CM | POA: Diagnosis not present

## 2018-11-08 DIAGNOSIS — J449 Chronic obstructive pulmonary disease, unspecified: Secondary | ICD-10-CM | POA: Diagnosis not present

## 2018-11-08 DIAGNOSIS — R32 Unspecified urinary incontinence: Secondary | ICD-10-CM | POA: Diagnosis not present

## 2018-11-08 DIAGNOSIS — S92322D Displaced fracture of second metatarsal bone, left foot, subsequent encounter for fracture with routine healing: Secondary | ICD-10-CM | POA: Diagnosis not present

## 2018-11-08 DIAGNOSIS — S82832D Other fracture of upper and lower end of left fibula, subsequent encounter for closed fracture with routine healing: Secondary | ICD-10-CM | POA: Diagnosis not present

## 2018-11-08 DIAGNOSIS — S82892D Other fracture of left lower leg, subsequent encounter for closed fracture with routine healing: Secondary | ICD-10-CM | POA: Diagnosis not present

## 2018-11-08 DIAGNOSIS — S72401D Unspecified fracture of lower end of right femur, subsequent encounter for closed fracture with routine healing: Secondary | ICD-10-CM | POA: Diagnosis not present

## 2018-11-09 DIAGNOSIS — R279 Unspecified lack of coordination: Secondary | ICD-10-CM | POA: Diagnosis not present

## 2018-11-09 DIAGNOSIS — M6281 Muscle weakness (generalized): Secondary | ICD-10-CM | POA: Diagnosis not present

## 2018-11-09 DIAGNOSIS — J449 Chronic obstructive pulmonary disease, unspecified: Secondary | ICD-10-CM | POA: Diagnosis not present

## 2018-11-09 DIAGNOSIS — S82892D Other fracture of left lower leg, subsequent encounter for closed fracture with routine healing: Secondary | ICD-10-CM | POA: Diagnosis not present

## 2018-11-09 DIAGNOSIS — G309 Alzheimer's disease, unspecified: Secondary | ICD-10-CM | POA: Diagnosis not present

## 2018-11-09 DIAGNOSIS — R32 Unspecified urinary incontinence: Secondary | ICD-10-CM | POA: Diagnosis not present

## 2018-11-09 DIAGNOSIS — Z9181 History of falling: Secondary | ICD-10-CM | POA: Diagnosis not present

## 2018-11-09 DIAGNOSIS — S72401D Unspecified fracture of lower end of right femur, subsequent encounter for closed fracture with routine healing: Secondary | ICD-10-CM | POA: Diagnosis not present

## 2018-11-09 DIAGNOSIS — J45909 Unspecified asthma, uncomplicated: Secondary | ICD-10-CM | POA: Diagnosis not present

## 2018-11-10 DIAGNOSIS — Z9181 History of falling: Secondary | ICD-10-CM | POA: Diagnosis not present

## 2018-11-10 DIAGNOSIS — S82892D Other fracture of left lower leg, subsequent encounter for closed fracture with routine healing: Secondary | ICD-10-CM | POA: Diagnosis not present

## 2018-11-10 DIAGNOSIS — R279 Unspecified lack of coordination: Secondary | ICD-10-CM | POA: Diagnosis not present

## 2018-11-10 DIAGNOSIS — J449 Chronic obstructive pulmonary disease, unspecified: Secondary | ICD-10-CM | POA: Diagnosis not present

## 2018-11-10 DIAGNOSIS — J45909 Unspecified asthma, uncomplicated: Secondary | ICD-10-CM | POA: Diagnosis not present

## 2018-11-10 DIAGNOSIS — R32 Unspecified urinary incontinence: Secondary | ICD-10-CM | POA: Diagnosis not present

## 2018-11-10 DIAGNOSIS — S72401D Unspecified fracture of lower end of right femur, subsequent encounter for closed fracture with routine healing: Secondary | ICD-10-CM | POA: Diagnosis not present

## 2018-11-10 DIAGNOSIS — M6281 Muscle weakness (generalized): Secondary | ICD-10-CM | POA: Diagnosis not present

## 2018-11-10 DIAGNOSIS — G309 Alzheimer's disease, unspecified: Secondary | ICD-10-CM | POA: Diagnosis not present

## 2018-11-12 DIAGNOSIS — S82112A Displaced fracture of left tibial spine, initial encounter for closed fracture: Secondary | ICD-10-CM | POA: Diagnosis not present

## 2018-11-12 DIAGNOSIS — S81801A Unspecified open wound, right lower leg, initial encounter: Secondary | ICD-10-CM | POA: Diagnosis not present

## 2018-11-13 DIAGNOSIS — S82892D Other fracture of left lower leg, subsequent encounter for closed fracture with routine healing: Secondary | ICD-10-CM | POA: Diagnosis not present

## 2018-11-13 DIAGNOSIS — J449 Chronic obstructive pulmonary disease, unspecified: Secondary | ICD-10-CM | POA: Diagnosis not present

## 2018-11-13 DIAGNOSIS — S72401D Unspecified fracture of lower end of right femur, subsequent encounter for closed fracture with routine healing: Secondary | ICD-10-CM | POA: Diagnosis not present

## 2018-11-13 DIAGNOSIS — M6281 Muscle weakness (generalized): Secondary | ICD-10-CM | POA: Diagnosis not present

## 2018-11-13 DIAGNOSIS — G309 Alzheimer's disease, unspecified: Secondary | ICD-10-CM | POA: Diagnosis not present

## 2018-11-13 DIAGNOSIS — J45909 Unspecified asthma, uncomplicated: Secondary | ICD-10-CM | POA: Diagnosis not present

## 2018-11-13 DIAGNOSIS — Z9181 History of falling: Secondary | ICD-10-CM | POA: Diagnosis not present

## 2018-11-13 DIAGNOSIS — R32 Unspecified urinary incontinence: Secondary | ICD-10-CM | POA: Diagnosis not present

## 2018-11-13 DIAGNOSIS — R279 Unspecified lack of coordination: Secondary | ICD-10-CM | POA: Diagnosis not present

## 2018-11-14 DIAGNOSIS — R32 Unspecified urinary incontinence: Secondary | ICD-10-CM | POA: Diagnosis not present

## 2018-11-14 DIAGNOSIS — G309 Alzheimer's disease, unspecified: Secondary | ICD-10-CM | POA: Diagnosis not present

## 2018-11-14 DIAGNOSIS — J449 Chronic obstructive pulmonary disease, unspecified: Secondary | ICD-10-CM | POA: Diagnosis not present

## 2018-11-14 DIAGNOSIS — S72401D Unspecified fracture of lower end of right femur, subsequent encounter for closed fracture with routine healing: Secondary | ICD-10-CM | POA: Diagnosis not present

## 2018-11-14 DIAGNOSIS — S82892D Other fracture of left lower leg, subsequent encounter for closed fracture with routine healing: Secondary | ICD-10-CM | POA: Diagnosis not present

## 2018-11-14 DIAGNOSIS — R279 Unspecified lack of coordination: Secondary | ICD-10-CM | POA: Diagnosis not present

## 2018-11-14 DIAGNOSIS — M6281 Muscle weakness (generalized): Secondary | ICD-10-CM | POA: Diagnosis not present

## 2018-11-14 DIAGNOSIS — J45909 Unspecified asthma, uncomplicated: Secondary | ICD-10-CM | POA: Diagnosis not present

## 2018-11-14 DIAGNOSIS — Z9181 History of falling: Secondary | ICD-10-CM | POA: Diagnosis not present

## 2018-11-15 DIAGNOSIS — G309 Alzheimer's disease, unspecified: Secondary | ICD-10-CM | POA: Diagnosis not present

## 2018-11-15 DIAGNOSIS — M6281 Muscle weakness (generalized): Secondary | ICD-10-CM | POA: Diagnosis not present

## 2018-11-15 DIAGNOSIS — S82892D Other fracture of left lower leg, subsequent encounter for closed fracture with routine healing: Secondary | ICD-10-CM | POA: Diagnosis not present

## 2018-11-15 DIAGNOSIS — Z9181 History of falling: Secondary | ICD-10-CM | POA: Diagnosis not present

## 2018-11-15 DIAGNOSIS — R32 Unspecified urinary incontinence: Secondary | ICD-10-CM | POA: Diagnosis not present

## 2018-11-15 DIAGNOSIS — R279 Unspecified lack of coordination: Secondary | ICD-10-CM | POA: Diagnosis not present

## 2018-11-15 DIAGNOSIS — J45909 Unspecified asthma, uncomplicated: Secondary | ICD-10-CM | POA: Diagnosis not present

## 2018-11-15 DIAGNOSIS — S72401D Unspecified fracture of lower end of right femur, subsequent encounter for closed fracture with routine healing: Secondary | ICD-10-CM | POA: Diagnosis not present

## 2018-11-15 DIAGNOSIS — J449 Chronic obstructive pulmonary disease, unspecified: Secondary | ICD-10-CM | POA: Diagnosis not present

## 2018-11-16 DIAGNOSIS — G309 Alzheimer's disease, unspecified: Secondary | ICD-10-CM | POA: Diagnosis not present

## 2018-11-16 DIAGNOSIS — J45909 Unspecified asthma, uncomplicated: Secondary | ICD-10-CM | POA: Diagnosis not present

## 2018-11-16 DIAGNOSIS — F0280 Dementia in other diseases classified elsewhere without behavioral disturbance: Secondary | ICD-10-CM | POA: Diagnosis not present

## 2018-11-16 DIAGNOSIS — G4701 Insomnia due to medical condition: Secondary | ICD-10-CM | POA: Diagnosis not present

## 2018-11-16 DIAGNOSIS — S82892D Other fracture of left lower leg, subsequent encounter for closed fracture with routine healing: Secondary | ICD-10-CM | POA: Diagnosis not present

## 2018-11-16 DIAGNOSIS — J449 Chronic obstructive pulmonary disease, unspecified: Secondary | ICD-10-CM | POA: Diagnosis not present

## 2018-11-16 DIAGNOSIS — R279 Unspecified lack of coordination: Secondary | ICD-10-CM | POA: Diagnosis not present

## 2018-11-16 DIAGNOSIS — G301 Alzheimer's disease with late onset: Secondary | ICD-10-CM | POA: Diagnosis not present

## 2018-11-16 DIAGNOSIS — Z9181 History of falling: Secondary | ICD-10-CM | POA: Diagnosis not present

## 2018-11-16 DIAGNOSIS — R32 Unspecified urinary incontinence: Secondary | ICD-10-CM | POA: Diagnosis not present

## 2018-11-16 DIAGNOSIS — F330 Major depressive disorder, recurrent, mild: Secondary | ICD-10-CM | POA: Diagnosis not present

## 2018-11-16 DIAGNOSIS — M6281 Muscle weakness (generalized): Secondary | ICD-10-CM | POA: Diagnosis not present

## 2018-11-16 DIAGNOSIS — S72401D Unspecified fracture of lower end of right femur, subsequent encounter for closed fracture with routine healing: Secondary | ICD-10-CM | POA: Diagnosis not present

## 2018-11-17 DIAGNOSIS — S72401D Unspecified fracture of lower end of right femur, subsequent encounter for closed fracture with routine healing: Secondary | ICD-10-CM | POA: Diagnosis not present

## 2018-11-17 DIAGNOSIS — G309 Alzheimer's disease, unspecified: Secondary | ICD-10-CM | POA: Diagnosis not present

## 2018-11-17 DIAGNOSIS — R279 Unspecified lack of coordination: Secondary | ICD-10-CM | POA: Diagnosis not present

## 2018-11-17 DIAGNOSIS — M6281 Muscle weakness (generalized): Secondary | ICD-10-CM | POA: Diagnosis not present

## 2018-11-17 DIAGNOSIS — J449 Chronic obstructive pulmonary disease, unspecified: Secondary | ICD-10-CM | POA: Diagnosis not present

## 2018-11-17 DIAGNOSIS — Z9181 History of falling: Secondary | ICD-10-CM | POA: Diagnosis not present

## 2018-11-17 DIAGNOSIS — J45909 Unspecified asthma, uncomplicated: Secondary | ICD-10-CM | POA: Diagnosis not present

## 2018-11-17 DIAGNOSIS — R32 Unspecified urinary incontinence: Secondary | ICD-10-CM | POA: Diagnosis not present

## 2018-11-17 DIAGNOSIS — S82892D Other fracture of left lower leg, subsequent encounter for closed fracture with routine healing: Secondary | ICD-10-CM | POA: Diagnosis not present

## 2018-11-20 DIAGNOSIS — R32 Unspecified urinary incontinence: Secondary | ICD-10-CM | POA: Diagnosis not present

## 2018-11-20 DIAGNOSIS — Z9181 History of falling: Secondary | ICD-10-CM | POA: Diagnosis not present

## 2018-11-20 DIAGNOSIS — R279 Unspecified lack of coordination: Secondary | ICD-10-CM | POA: Diagnosis not present

## 2018-11-20 DIAGNOSIS — G309 Alzheimer's disease, unspecified: Secondary | ICD-10-CM | POA: Diagnosis not present

## 2018-11-20 DIAGNOSIS — S72401D Unspecified fracture of lower end of right femur, subsequent encounter for closed fracture with routine healing: Secondary | ICD-10-CM | POA: Diagnosis not present

## 2018-11-20 DIAGNOSIS — J449 Chronic obstructive pulmonary disease, unspecified: Secondary | ICD-10-CM | POA: Diagnosis not present

## 2018-11-20 DIAGNOSIS — S82892D Other fracture of left lower leg, subsequent encounter for closed fracture with routine healing: Secondary | ICD-10-CM | POA: Diagnosis not present

## 2018-11-20 DIAGNOSIS — M6281 Muscle weakness (generalized): Secondary | ICD-10-CM | POA: Diagnosis not present

## 2018-11-20 DIAGNOSIS — J45909 Unspecified asthma, uncomplicated: Secondary | ICD-10-CM | POA: Diagnosis not present

## 2018-11-21 DIAGNOSIS — M6281 Muscle weakness (generalized): Secondary | ICD-10-CM | POA: Diagnosis not present

## 2018-11-21 DIAGNOSIS — J45909 Unspecified asthma, uncomplicated: Secondary | ICD-10-CM | POA: Diagnosis not present

## 2018-11-21 DIAGNOSIS — S82892D Other fracture of left lower leg, subsequent encounter for closed fracture with routine healing: Secondary | ICD-10-CM | POA: Diagnosis not present

## 2018-11-21 DIAGNOSIS — J449 Chronic obstructive pulmonary disease, unspecified: Secondary | ICD-10-CM | POA: Diagnosis not present

## 2018-11-21 DIAGNOSIS — R279 Unspecified lack of coordination: Secondary | ICD-10-CM | POA: Diagnosis not present

## 2018-11-21 DIAGNOSIS — Z9181 History of falling: Secondary | ICD-10-CM | POA: Diagnosis not present

## 2018-11-21 DIAGNOSIS — R32 Unspecified urinary incontinence: Secondary | ICD-10-CM | POA: Diagnosis not present

## 2018-11-21 DIAGNOSIS — G309 Alzheimer's disease, unspecified: Secondary | ICD-10-CM | POA: Diagnosis not present

## 2018-11-21 DIAGNOSIS — S72401D Unspecified fracture of lower end of right femur, subsequent encounter for closed fracture with routine healing: Secondary | ICD-10-CM | POA: Diagnosis not present

## 2018-11-22 DIAGNOSIS — S72401D Unspecified fracture of lower end of right femur, subsequent encounter for closed fracture with routine healing: Secondary | ICD-10-CM | POA: Diagnosis not present

## 2018-11-22 DIAGNOSIS — S82892D Other fracture of left lower leg, subsequent encounter for closed fracture with routine healing: Secondary | ICD-10-CM | POA: Diagnosis not present

## 2018-11-22 DIAGNOSIS — M6281 Muscle weakness (generalized): Secondary | ICD-10-CM | POA: Diagnosis not present

## 2018-11-22 DIAGNOSIS — J45909 Unspecified asthma, uncomplicated: Secondary | ICD-10-CM | POA: Diagnosis not present

## 2018-11-22 DIAGNOSIS — R279 Unspecified lack of coordination: Secondary | ICD-10-CM | POA: Diagnosis not present

## 2018-11-22 DIAGNOSIS — J449 Chronic obstructive pulmonary disease, unspecified: Secondary | ICD-10-CM | POA: Diagnosis not present

## 2018-11-22 DIAGNOSIS — R32 Unspecified urinary incontinence: Secondary | ICD-10-CM | POA: Diagnosis not present

## 2018-11-22 DIAGNOSIS — G309 Alzheimer's disease, unspecified: Secondary | ICD-10-CM | POA: Diagnosis not present

## 2018-11-22 DIAGNOSIS — Z9181 History of falling: Secondary | ICD-10-CM | POA: Diagnosis not present

## 2018-11-23 DIAGNOSIS — F330 Major depressive disorder, recurrent, mild: Secondary | ICD-10-CM | POA: Diagnosis not present

## 2018-11-23 DIAGNOSIS — R279 Unspecified lack of coordination: Secondary | ICD-10-CM | POA: Diagnosis not present

## 2018-11-23 DIAGNOSIS — Z9181 History of falling: Secondary | ICD-10-CM | POA: Diagnosis not present

## 2018-11-23 DIAGNOSIS — J45909 Unspecified asthma, uncomplicated: Secondary | ICD-10-CM | POA: Diagnosis not present

## 2018-11-23 DIAGNOSIS — F0280 Dementia in other diseases classified elsewhere without behavioral disturbance: Secondary | ICD-10-CM | POA: Diagnosis not present

## 2018-11-23 DIAGNOSIS — G301 Alzheimer's disease with late onset: Secondary | ICD-10-CM | POA: Diagnosis not present

## 2018-11-23 DIAGNOSIS — G309 Alzheimer's disease, unspecified: Secondary | ICD-10-CM | POA: Diagnosis not present

## 2018-11-23 DIAGNOSIS — R32 Unspecified urinary incontinence: Secondary | ICD-10-CM | POA: Diagnosis not present

## 2018-11-23 DIAGNOSIS — S82892D Other fracture of left lower leg, subsequent encounter for closed fracture with routine healing: Secondary | ICD-10-CM | POA: Diagnosis not present

## 2018-11-23 DIAGNOSIS — J449 Chronic obstructive pulmonary disease, unspecified: Secondary | ICD-10-CM | POA: Diagnosis not present

## 2018-11-23 DIAGNOSIS — G4701 Insomnia due to medical condition: Secondary | ICD-10-CM | POA: Diagnosis not present

## 2018-11-23 DIAGNOSIS — S72401D Unspecified fracture of lower end of right femur, subsequent encounter for closed fracture with routine healing: Secondary | ICD-10-CM | POA: Diagnosis not present

## 2018-11-23 DIAGNOSIS — M6281 Muscle weakness (generalized): Secondary | ICD-10-CM | POA: Diagnosis not present

## 2018-11-24 DIAGNOSIS — J45909 Unspecified asthma, uncomplicated: Secondary | ICD-10-CM | POA: Diagnosis not present

## 2018-11-24 DIAGNOSIS — M6281 Muscle weakness (generalized): Secondary | ICD-10-CM | POA: Diagnosis not present

## 2018-11-24 DIAGNOSIS — R279 Unspecified lack of coordination: Secondary | ICD-10-CM | POA: Diagnosis not present

## 2018-11-24 DIAGNOSIS — S82892D Other fracture of left lower leg, subsequent encounter for closed fracture with routine healing: Secondary | ICD-10-CM | POA: Diagnosis not present

## 2018-11-24 DIAGNOSIS — S72401D Unspecified fracture of lower end of right femur, subsequent encounter for closed fracture with routine healing: Secondary | ICD-10-CM | POA: Diagnosis not present

## 2018-11-24 DIAGNOSIS — Z9181 History of falling: Secondary | ICD-10-CM | POA: Diagnosis not present

## 2018-11-24 DIAGNOSIS — G309 Alzheimer's disease, unspecified: Secondary | ICD-10-CM | POA: Diagnosis not present

## 2018-11-24 DIAGNOSIS — R32 Unspecified urinary incontinence: Secondary | ICD-10-CM | POA: Diagnosis not present

## 2018-11-24 DIAGNOSIS — J449 Chronic obstructive pulmonary disease, unspecified: Secondary | ICD-10-CM | POA: Diagnosis not present

## 2018-11-27 DIAGNOSIS — S72401D Unspecified fracture of lower end of right femur, subsequent encounter for closed fracture with routine healing: Secondary | ICD-10-CM | POA: Diagnosis not present

## 2018-11-27 DIAGNOSIS — R32 Unspecified urinary incontinence: Secondary | ICD-10-CM | POA: Diagnosis not present

## 2018-11-27 DIAGNOSIS — J45909 Unspecified asthma, uncomplicated: Secondary | ICD-10-CM | POA: Diagnosis not present

## 2018-11-27 DIAGNOSIS — S82892D Other fracture of left lower leg, subsequent encounter for closed fracture with routine healing: Secondary | ICD-10-CM | POA: Diagnosis not present

## 2018-11-27 DIAGNOSIS — J449 Chronic obstructive pulmonary disease, unspecified: Secondary | ICD-10-CM | POA: Diagnosis not present

## 2018-11-27 DIAGNOSIS — Z9181 History of falling: Secondary | ICD-10-CM | POA: Diagnosis not present

## 2018-11-27 DIAGNOSIS — M6281 Muscle weakness (generalized): Secondary | ICD-10-CM | POA: Diagnosis not present

## 2018-11-27 DIAGNOSIS — G309 Alzheimer's disease, unspecified: Secondary | ICD-10-CM | POA: Diagnosis not present

## 2018-11-27 DIAGNOSIS — R279 Unspecified lack of coordination: Secondary | ICD-10-CM | POA: Diagnosis not present

## 2018-11-28 DIAGNOSIS — S82892D Other fracture of left lower leg, subsequent encounter for closed fracture with routine healing: Secondary | ICD-10-CM | POA: Diagnosis not present

## 2018-11-28 DIAGNOSIS — J449 Chronic obstructive pulmonary disease, unspecified: Secondary | ICD-10-CM | POA: Diagnosis not present

## 2018-11-28 DIAGNOSIS — Z9181 History of falling: Secondary | ICD-10-CM | POA: Diagnosis not present

## 2018-11-28 DIAGNOSIS — M6281 Muscle weakness (generalized): Secondary | ICD-10-CM | POA: Diagnosis not present

## 2018-11-28 DIAGNOSIS — J45909 Unspecified asthma, uncomplicated: Secondary | ICD-10-CM | POA: Diagnosis not present

## 2018-11-28 DIAGNOSIS — G309 Alzheimer's disease, unspecified: Secondary | ICD-10-CM | POA: Diagnosis not present

## 2018-11-28 DIAGNOSIS — R32 Unspecified urinary incontinence: Secondary | ICD-10-CM | POA: Diagnosis not present

## 2018-11-28 DIAGNOSIS — S72401D Unspecified fracture of lower end of right femur, subsequent encounter for closed fracture with routine healing: Secondary | ICD-10-CM | POA: Diagnosis not present

## 2018-11-28 DIAGNOSIS — R279 Unspecified lack of coordination: Secondary | ICD-10-CM | POA: Diagnosis not present

## 2018-11-29 DIAGNOSIS — Z9181 History of falling: Secondary | ICD-10-CM | POA: Diagnosis not present

## 2018-11-29 DIAGNOSIS — G309 Alzheimer's disease, unspecified: Secondary | ICD-10-CM | POA: Diagnosis not present

## 2018-11-29 DIAGNOSIS — J449 Chronic obstructive pulmonary disease, unspecified: Secondary | ICD-10-CM | POA: Diagnosis not present

## 2018-11-29 DIAGNOSIS — S72401D Unspecified fracture of lower end of right femur, subsequent encounter for closed fracture with routine healing: Secondary | ICD-10-CM | POA: Diagnosis not present

## 2018-11-29 DIAGNOSIS — J45909 Unspecified asthma, uncomplicated: Secondary | ICD-10-CM | POA: Diagnosis not present

## 2018-11-29 DIAGNOSIS — R32 Unspecified urinary incontinence: Secondary | ICD-10-CM | POA: Diagnosis not present

## 2018-11-29 DIAGNOSIS — S82892D Other fracture of left lower leg, subsequent encounter for closed fracture with routine healing: Secondary | ICD-10-CM | POA: Diagnosis not present

## 2018-11-29 DIAGNOSIS — M6281 Muscle weakness (generalized): Secondary | ICD-10-CM | POA: Diagnosis not present

## 2018-11-29 DIAGNOSIS — R279 Unspecified lack of coordination: Secondary | ICD-10-CM | POA: Diagnosis not present

## 2018-11-30 DIAGNOSIS — J45909 Unspecified asthma, uncomplicated: Secondary | ICD-10-CM | POA: Diagnosis not present

## 2018-11-30 DIAGNOSIS — R279 Unspecified lack of coordination: Secondary | ICD-10-CM | POA: Diagnosis not present

## 2018-11-30 DIAGNOSIS — R32 Unspecified urinary incontinence: Secondary | ICD-10-CM | POA: Diagnosis not present

## 2018-11-30 DIAGNOSIS — M6281 Muscle weakness (generalized): Secondary | ICD-10-CM | POA: Diagnosis not present

## 2018-11-30 DIAGNOSIS — Z9181 History of falling: Secondary | ICD-10-CM | POA: Diagnosis not present

## 2018-11-30 DIAGNOSIS — S82892D Other fracture of left lower leg, subsequent encounter for closed fracture with routine healing: Secondary | ICD-10-CM | POA: Diagnosis not present

## 2018-11-30 DIAGNOSIS — S72401D Unspecified fracture of lower end of right femur, subsequent encounter for closed fracture with routine healing: Secondary | ICD-10-CM | POA: Diagnosis not present

## 2018-11-30 DIAGNOSIS — G309 Alzheimer's disease, unspecified: Secondary | ICD-10-CM | POA: Diagnosis not present

## 2018-11-30 DIAGNOSIS — J449 Chronic obstructive pulmonary disease, unspecified: Secondary | ICD-10-CM | POA: Diagnosis not present

## 2018-12-05 DIAGNOSIS — G894 Chronic pain syndrome: Secondary | ICD-10-CM | POA: Diagnosis not present

## 2018-12-05 DIAGNOSIS — M84364A Stress fracture, left fibula, initial encounter for fracture: Secondary | ICD-10-CM | POA: Diagnosis not present

## 2018-12-19 DIAGNOSIS — M84364A Stress fracture, left fibula, initial encounter for fracture: Secondary | ICD-10-CM | POA: Diagnosis not present

## 2018-12-19 DIAGNOSIS — G894 Chronic pain syndrome: Secondary | ICD-10-CM | POA: Diagnosis not present

## 2018-12-21 DIAGNOSIS — F0280 Dementia in other diseases classified elsewhere without behavioral disturbance: Secondary | ICD-10-CM | POA: Diagnosis not present

## 2018-12-21 DIAGNOSIS — R296 Repeated falls: Secondary | ICD-10-CM | POA: Diagnosis not present

## 2018-12-21 DIAGNOSIS — G301 Alzheimer's disease with late onset: Secondary | ICD-10-CM | POA: Diagnosis not present

## 2018-12-21 DIAGNOSIS — J159 Unspecified bacterial pneumonia: Secondary | ICD-10-CM | POA: Diagnosis not present

## 2018-12-21 DIAGNOSIS — N179 Acute kidney failure, unspecified: Secondary | ICD-10-CM | POA: Diagnosis not present

## 2018-12-21 DIAGNOSIS — Z7409 Other reduced mobility: Secondary | ICD-10-CM | POA: Diagnosis not present

## 2018-12-21 DIAGNOSIS — S82402A Unspecified fracture of shaft of left fibula, initial encounter for closed fracture: Secondary | ICD-10-CM | POA: Diagnosis not present

## 2018-12-21 DIAGNOSIS — S92341A Displaced fracture of fourth metatarsal bone, right foot, initial encounter for closed fracture: Secondary | ICD-10-CM | POA: Diagnosis not present

## 2018-12-21 DIAGNOSIS — S72431A Displaced fracture of medial condyle of right femur, initial encounter for closed fracture: Secondary | ICD-10-CM | POA: Diagnosis not present

## 2018-12-21 DIAGNOSIS — M6281 Muscle weakness (generalized): Secondary | ICD-10-CM | POA: Diagnosis not present

## 2018-12-21 DIAGNOSIS — G4701 Insomnia due to medical condition: Secondary | ICD-10-CM | POA: Diagnosis not present

## 2018-12-21 DIAGNOSIS — J449 Chronic obstructive pulmonary disease, unspecified: Secondary | ICD-10-CM | POA: Diagnosis not present

## 2018-12-21 DIAGNOSIS — F330 Major depressive disorder, recurrent, mild: Secondary | ICD-10-CM | POA: Diagnosis not present

## 2019-01-04 DIAGNOSIS — F330 Major depressive disorder, recurrent, mild: Secondary | ICD-10-CM | POA: Diagnosis not present

## 2019-01-04 DIAGNOSIS — G4701 Insomnia due to medical condition: Secondary | ICD-10-CM | POA: Diagnosis not present

## 2019-01-04 DIAGNOSIS — F0280 Dementia in other diseases classified elsewhere without behavioral disturbance: Secondary | ICD-10-CM | POA: Diagnosis not present

## 2019-01-04 DIAGNOSIS — G301 Alzheimer's disease with late onset: Secondary | ICD-10-CM | POA: Diagnosis not present

## 2019-01-16 DIAGNOSIS — I739 Peripheral vascular disease, unspecified: Secondary | ICD-10-CM | POA: Diagnosis not present

## 2019-01-16 DIAGNOSIS — J449 Chronic obstructive pulmonary disease, unspecified: Secondary | ICD-10-CM | POA: Diagnosis not present

## 2019-01-16 DIAGNOSIS — D509 Iron deficiency anemia, unspecified: Secondary | ICD-10-CM | POA: Diagnosis not present

## 2019-01-16 DIAGNOSIS — L603 Nail dystrophy: Secondary | ICD-10-CM | POA: Diagnosis not present

## 2019-01-16 DIAGNOSIS — S81801A Unspecified open wound, right lower leg, initial encounter: Secondary | ICD-10-CM | POA: Diagnosis not present

## 2019-01-16 DIAGNOSIS — Q845 Enlarged and hypertrophic nails: Secondary | ICD-10-CM | POA: Diagnosis not present

## 2019-01-16 DIAGNOSIS — G894 Chronic pain syndrome: Secondary | ICD-10-CM | POA: Diagnosis not present

## 2019-01-16 DIAGNOSIS — B351 Tinea unguium: Secondary | ICD-10-CM | POA: Diagnosis not present

## 2019-02-05 DIAGNOSIS — F0280 Dementia in other diseases classified elsewhere without behavioral disturbance: Secondary | ICD-10-CM | POA: Insufficient documentation

## 2019-02-05 DIAGNOSIS — G301 Alzheimer's disease with late onset: Secondary | ICD-10-CM | POA: Insufficient documentation

## 2019-02-05 DIAGNOSIS — M810 Age-related osteoporosis without current pathological fracture: Secondary | ICD-10-CM | POA: Insufficient documentation

## 2019-04-02 DIAGNOSIS — Z9181 History of falling: Secondary | ICD-10-CM | POA: Diagnosis not present

## 2019-04-02 DIAGNOSIS — G309 Alzheimer's disease, unspecified: Secondary | ICD-10-CM | POA: Diagnosis not present

## 2019-04-02 DIAGNOSIS — S72401D Unspecified fracture of lower end of right femur, subsequent encounter for closed fracture with routine healing: Secondary | ICD-10-CM | POA: Diagnosis not present

## 2019-04-02 DIAGNOSIS — R32 Unspecified urinary incontinence: Secondary | ICD-10-CM | POA: Diagnosis not present

## 2019-04-02 DIAGNOSIS — M6281 Muscle weakness (generalized): Secondary | ICD-10-CM | POA: Diagnosis not present

## 2019-04-02 DIAGNOSIS — S82892D Other fracture of left lower leg, subsequent encounter for closed fracture with routine healing: Secondary | ICD-10-CM | POA: Diagnosis not present

## 2019-04-02 DIAGNOSIS — R279 Unspecified lack of coordination: Secondary | ICD-10-CM | POA: Diagnosis not present

## 2019-04-02 DIAGNOSIS — J449 Chronic obstructive pulmonary disease, unspecified: Secondary | ICD-10-CM | POA: Diagnosis not present

## 2019-04-02 DIAGNOSIS — J45909 Unspecified asthma, uncomplicated: Secondary | ICD-10-CM | POA: Diagnosis not present

## 2019-04-03 DIAGNOSIS — R32 Unspecified urinary incontinence: Secondary | ICD-10-CM | POA: Diagnosis not present

## 2019-04-03 DIAGNOSIS — G309 Alzheimer's disease, unspecified: Secondary | ICD-10-CM | POA: Diagnosis not present

## 2019-04-03 DIAGNOSIS — J449 Chronic obstructive pulmonary disease, unspecified: Secondary | ICD-10-CM | POA: Diagnosis not present

## 2019-04-03 DIAGNOSIS — S82892D Other fracture of left lower leg, subsequent encounter for closed fracture with routine healing: Secondary | ICD-10-CM | POA: Diagnosis not present

## 2019-04-03 DIAGNOSIS — J45909 Unspecified asthma, uncomplicated: Secondary | ICD-10-CM | POA: Diagnosis not present

## 2019-04-03 DIAGNOSIS — R279 Unspecified lack of coordination: Secondary | ICD-10-CM | POA: Diagnosis not present

## 2019-04-03 DIAGNOSIS — M6281 Muscle weakness (generalized): Secondary | ICD-10-CM | POA: Diagnosis not present

## 2019-04-03 DIAGNOSIS — Z9181 History of falling: Secondary | ICD-10-CM | POA: Diagnosis not present

## 2019-04-03 DIAGNOSIS — S72401D Unspecified fracture of lower end of right femur, subsequent encounter for closed fracture with routine healing: Secondary | ICD-10-CM | POA: Diagnosis not present

## 2019-04-04 DIAGNOSIS — S72401D Unspecified fracture of lower end of right femur, subsequent encounter for closed fracture with routine healing: Secondary | ICD-10-CM | POA: Diagnosis not present

## 2019-04-04 DIAGNOSIS — Z9181 History of falling: Secondary | ICD-10-CM | POA: Diagnosis not present

## 2019-04-04 DIAGNOSIS — G309 Alzheimer's disease, unspecified: Secondary | ICD-10-CM | POA: Diagnosis not present

## 2019-04-04 DIAGNOSIS — J449 Chronic obstructive pulmonary disease, unspecified: Secondary | ICD-10-CM | POA: Diagnosis not present

## 2019-04-04 DIAGNOSIS — R279 Unspecified lack of coordination: Secondary | ICD-10-CM | POA: Diagnosis not present

## 2019-04-04 DIAGNOSIS — S82892D Other fracture of left lower leg, subsequent encounter for closed fracture with routine healing: Secondary | ICD-10-CM | POA: Diagnosis not present

## 2019-04-04 DIAGNOSIS — R32 Unspecified urinary incontinence: Secondary | ICD-10-CM | POA: Diagnosis not present

## 2019-04-04 DIAGNOSIS — J45909 Unspecified asthma, uncomplicated: Secondary | ICD-10-CM | POA: Diagnosis not present

## 2019-04-04 DIAGNOSIS — M6281 Muscle weakness (generalized): Secondary | ICD-10-CM | POA: Diagnosis not present

## 2019-04-05 DIAGNOSIS — R279 Unspecified lack of coordination: Secondary | ICD-10-CM | POA: Diagnosis not present

## 2019-04-05 DIAGNOSIS — M6281 Muscle weakness (generalized): Secondary | ICD-10-CM | POA: Diagnosis not present

## 2019-04-05 DIAGNOSIS — J449 Chronic obstructive pulmonary disease, unspecified: Secondary | ICD-10-CM | POA: Diagnosis not present

## 2019-04-05 DIAGNOSIS — Z9181 History of falling: Secondary | ICD-10-CM | POA: Diagnosis not present

## 2019-04-05 DIAGNOSIS — S82892D Other fracture of left lower leg, subsequent encounter for closed fracture with routine healing: Secondary | ICD-10-CM | POA: Diagnosis not present

## 2019-04-05 DIAGNOSIS — J45909 Unspecified asthma, uncomplicated: Secondary | ICD-10-CM | POA: Diagnosis not present

## 2019-04-05 DIAGNOSIS — G309 Alzheimer's disease, unspecified: Secondary | ICD-10-CM | POA: Diagnosis not present

## 2019-04-05 DIAGNOSIS — R32 Unspecified urinary incontinence: Secondary | ICD-10-CM | POA: Diagnosis not present

## 2019-04-05 DIAGNOSIS — S72401D Unspecified fracture of lower end of right femur, subsequent encounter for closed fracture with routine healing: Secondary | ICD-10-CM | POA: Diagnosis not present

## 2019-04-06 DIAGNOSIS — S72401D Unspecified fracture of lower end of right femur, subsequent encounter for closed fracture with routine healing: Secondary | ICD-10-CM | POA: Diagnosis not present

## 2019-04-06 DIAGNOSIS — J449 Chronic obstructive pulmonary disease, unspecified: Secondary | ICD-10-CM | POA: Diagnosis not present

## 2019-04-06 DIAGNOSIS — M6281 Muscle weakness (generalized): Secondary | ICD-10-CM | POA: Diagnosis not present

## 2019-04-06 DIAGNOSIS — R32 Unspecified urinary incontinence: Secondary | ICD-10-CM | POA: Diagnosis not present

## 2019-04-06 DIAGNOSIS — R279 Unspecified lack of coordination: Secondary | ICD-10-CM | POA: Diagnosis not present

## 2019-04-06 DIAGNOSIS — S82892D Other fracture of left lower leg, subsequent encounter for closed fracture with routine healing: Secondary | ICD-10-CM | POA: Diagnosis not present

## 2019-04-06 DIAGNOSIS — G309 Alzheimer's disease, unspecified: Secondary | ICD-10-CM | POA: Diagnosis not present

## 2019-04-06 DIAGNOSIS — Z9181 History of falling: Secondary | ICD-10-CM | POA: Diagnosis not present

## 2019-04-06 DIAGNOSIS — J45909 Unspecified asthma, uncomplicated: Secondary | ICD-10-CM | POA: Diagnosis not present

## 2019-04-09 DIAGNOSIS — J449 Chronic obstructive pulmonary disease, unspecified: Secondary | ICD-10-CM | POA: Diagnosis not present

## 2019-04-09 DIAGNOSIS — S82892D Other fracture of left lower leg, subsequent encounter for closed fracture with routine healing: Secondary | ICD-10-CM | POA: Diagnosis not present

## 2019-04-09 DIAGNOSIS — G309 Alzheimer's disease, unspecified: Secondary | ICD-10-CM | POA: Diagnosis not present

## 2019-04-09 DIAGNOSIS — R279 Unspecified lack of coordination: Secondary | ICD-10-CM | POA: Diagnosis not present

## 2019-04-09 DIAGNOSIS — Z9181 History of falling: Secondary | ICD-10-CM | POA: Diagnosis not present

## 2019-04-09 DIAGNOSIS — S72401D Unspecified fracture of lower end of right femur, subsequent encounter for closed fracture with routine healing: Secondary | ICD-10-CM | POA: Diagnosis not present

## 2019-04-09 DIAGNOSIS — M6281 Muscle weakness (generalized): Secondary | ICD-10-CM | POA: Diagnosis not present

## 2019-04-09 DIAGNOSIS — R32 Unspecified urinary incontinence: Secondary | ICD-10-CM | POA: Diagnosis not present

## 2019-04-09 DIAGNOSIS — J45909 Unspecified asthma, uncomplicated: Secondary | ICD-10-CM | POA: Diagnosis not present

## 2019-04-10 DIAGNOSIS — S72401D Unspecified fracture of lower end of right femur, subsequent encounter for closed fracture with routine healing: Secondary | ICD-10-CM | POA: Diagnosis not present

## 2019-04-10 DIAGNOSIS — G309 Alzheimer's disease, unspecified: Secondary | ICD-10-CM | POA: Diagnosis not present

## 2019-04-10 DIAGNOSIS — J449 Chronic obstructive pulmonary disease, unspecified: Secondary | ICD-10-CM | POA: Diagnosis not present

## 2019-04-10 DIAGNOSIS — Z9181 History of falling: Secondary | ICD-10-CM | POA: Diagnosis not present

## 2019-04-10 DIAGNOSIS — R279 Unspecified lack of coordination: Secondary | ICD-10-CM | POA: Diagnosis not present

## 2019-04-10 DIAGNOSIS — J45909 Unspecified asthma, uncomplicated: Secondary | ICD-10-CM | POA: Diagnosis not present

## 2019-04-10 DIAGNOSIS — S82892D Other fracture of left lower leg, subsequent encounter for closed fracture with routine healing: Secondary | ICD-10-CM | POA: Diagnosis not present

## 2019-04-10 DIAGNOSIS — R32 Unspecified urinary incontinence: Secondary | ICD-10-CM | POA: Diagnosis not present

## 2019-04-10 DIAGNOSIS — M6281 Muscle weakness (generalized): Secondary | ICD-10-CM | POA: Diagnosis not present

## 2019-04-11 DIAGNOSIS — R32 Unspecified urinary incontinence: Secondary | ICD-10-CM | POA: Diagnosis not present

## 2019-04-11 DIAGNOSIS — J45909 Unspecified asthma, uncomplicated: Secondary | ICD-10-CM | POA: Diagnosis not present

## 2019-04-11 DIAGNOSIS — M6281 Muscle weakness (generalized): Secondary | ICD-10-CM | POA: Diagnosis not present

## 2019-04-11 DIAGNOSIS — G309 Alzheimer's disease, unspecified: Secondary | ICD-10-CM | POA: Diagnosis not present

## 2019-04-11 DIAGNOSIS — S72401D Unspecified fracture of lower end of right femur, subsequent encounter for closed fracture with routine healing: Secondary | ICD-10-CM | POA: Diagnosis not present

## 2019-04-11 DIAGNOSIS — Z9181 History of falling: Secondary | ICD-10-CM | POA: Diagnosis not present

## 2019-04-11 DIAGNOSIS — J449 Chronic obstructive pulmonary disease, unspecified: Secondary | ICD-10-CM | POA: Diagnosis not present

## 2019-04-11 DIAGNOSIS — R279 Unspecified lack of coordination: Secondary | ICD-10-CM | POA: Diagnosis not present

## 2019-04-11 DIAGNOSIS — S82892D Other fracture of left lower leg, subsequent encounter for closed fracture with routine healing: Secondary | ICD-10-CM | POA: Diagnosis not present

## 2019-04-19 DIAGNOSIS — D519 Vitamin B12 deficiency anemia, unspecified: Secondary | ICD-10-CM | POA: Diagnosis not present

## 2019-04-19 DIAGNOSIS — I10 Essential (primary) hypertension: Secondary | ICD-10-CM | POA: Diagnosis not present

## 2019-04-19 DIAGNOSIS — E559 Vitamin D deficiency, unspecified: Secondary | ICD-10-CM | POA: Diagnosis not present

## 2019-04-20 DIAGNOSIS — Z1159 Encounter for screening for other viral diseases: Secondary | ICD-10-CM | POA: Diagnosis not present

## 2019-04-23 DIAGNOSIS — F339 Major depressive disorder, recurrent, unspecified: Secondary | ICD-10-CM | POA: Diagnosis not present

## 2019-04-23 DIAGNOSIS — N179 Acute kidney failure, unspecified: Secondary | ICD-10-CM | POA: Diagnosis not present

## 2019-04-23 DIAGNOSIS — S32501D Unspecified fracture of right pubis, subsequent encounter for fracture with routine healing: Secondary | ICD-10-CM | POA: Diagnosis not present

## 2019-04-23 DIAGNOSIS — R32 Unspecified urinary incontinence: Secondary | ICD-10-CM | POA: Diagnosis not present

## 2019-04-23 DIAGNOSIS — M6281 Muscle weakness (generalized): Secondary | ICD-10-CM | POA: Diagnosis not present

## 2019-04-23 DIAGNOSIS — F0280 Dementia in other diseases classified elsewhere without behavioral disturbance: Secondary | ICD-10-CM | POA: Diagnosis not present

## 2019-04-23 DIAGNOSIS — Z9181 History of falling: Secondary | ICD-10-CM | POA: Diagnosis not present

## 2019-04-23 DIAGNOSIS — J449 Chronic obstructive pulmonary disease, unspecified: Secondary | ICD-10-CM | POA: Diagnosis not present

## 2019-04-23 DIAGNOSIS — R279 Unspecified lack of coordination: Secondary | ICD-10-CM | POA: Diagnosis not present

## 2019-04-23 DIAGNOSIS — K862 Cyst of pancreas: Secondary | ICD-10-CM | POA: Diagnosis not present

## 2019-04-23 DIAGNOSIS — G309 Alzheimer's disease, unspecified: Secondary | ICD-10-CM | POA: Diagnosis not present

## 2019-04-23 DIAGNOSIS — J45909 Unspecified asthma, uncomplicated: Secondary | ICD-10-CM | POA: Diagnosis not present

## 2019-04-23 DIAGNOSIS — S72401D Unspecified fracture of lower end of right femur, subsequent encounter for closed fracture with routine healing: Secondary | ICD-10-CM | POA: Diagnosis not present

## 2019-04-24 DIAGNOSIS — J449 Chronic obstructive pulmonary disease, unspecified: Secondary | ICD-10-CM | POA: Diagnosis not present

## 2019-04-24 DIAGNOSIS — J45909 Unspecified asthma, uncomplicated: Secondary | ICD-10-CM | POA: Diagnosis not present

## 2019-04-24 DIAGNOSIS — R279 Unspecified lack of coordination: Secondary | ICD-10-CM | POA: Diagnosis not present

## 2019-04-24 DIAGNOSIS — Z9181 History of falling: Secondary | ICD-10-CM | POA: Diagnosis not present

## 2019-04-24 DIAGNOSIS — Z1159 Encounter for screening for other viral diseases: Secondary | ICD-10-CM | POA: Diagnosis not present

## 2019-04-24 DIAGNOSIS — M6281 Muscle weakness (generalized): Secondary | ICD-10-CM | POA: Diagnosis not present

## 2019-04-24 DIAGNOSIS — G309 Alzheimer's disease, unspecified: Secondary | ICD-10-CM | POA: Diagnosis not present

## 2019-04-25 DIAGNOSIS — R279 Unspecified lack of coordination: Secondary | ICD-10-CM | POA: Diagnosis not present

## 2019-04-25 DIAGNOSIS — G309 Alzheimer's disease, unspecified: Secondary | ICD-10-CM | POA: Diagnosis not present

## 2019-04-25 DIAGNOSIS — J45909 Unspecified asthma, uncomplicated: Secondary | ICD-10-CM | POA: Diagnosis not present

## 2019-04-25 DIAGNOSIS — J449 Chronic obstructive pulmonary disease, unspecified: Secondary | ICD-10-CM | POA: Diagnosis not present

## 2019-04-25 DIAGNOSIS — M6281 Muscle weakness (generalized): Secondary | ICD-10-CM | POA: Diagnosis not present

## 2019-04-25 DIAGNOSIS — Z9181 History of falling: Secondary | ICD-10-CM | POA: Diagnosis not present

## 2019-04-26 DIAGNOSIS — J45909 Unspecified asthma, uncomplicated: Secondary | ICD-10-CM | POA: Diagnosis not present

## 2019-04-26 DIAGNOSIS — J449 Chronic obstructive pulmonary disease, unspecified: Secondary | ICD-10-CM | POA: Diagnosis not present

## 2019-04-26 DIAGNOSIS — Z9181 History of falling: Secondary | ICD-10-CM | POA: Diagnosis not present

## 2019-04-26 DIAGNOSIS — M6281 Muscle weakness (generalized): Secondary | ICD-10-CM | POA: Diagnosis not present

## 2019-04-26 DIAGNOSIS — R279 Unspecified lack of coordination: Secondary | ICD-10-CM | POA: Diagnosis not present

## 2019-04-26 DIAGNOSIS — G309 Alzheimer's disease, unspecified: Secondary | ICD-10-CM | POA: Diagnosis not present

## 2019-04-27 DIAGNOSIS — F0280 Dementia in other diseases classified elsewhere without behavioral disturbance: Secondary | ICD-10-CM | POA: Diagnosis not present

## 2019-04-27 DIAGNOSIS — G301 Alzheimer's disease with late onset: Secondary | ICD-10-CM | POA: Diagnosis not present

## 2019-04-27 DIAGNOSIS — Z1159 Encounter for screening for other viral diseases: Secondary | ICD-10-CM | POA: Diagnosis not present

## 2019-04-27 DIAGNOSIS — F330 Major depressive disorder, recurrent, mild: Secondary | ICD-10-CM | POA: Diagnosis not present

## 2019-04-30 DIAGNOSIS — G309 Alzheimer's disease, unspecified: Secondary | ICD-10-CM | POA: Diagnosis not present

## 2019-04-30 DIAGNOSIS — Z9181 History of falling: Secondary | ICD-10-CM | POA: Diagnosis not present

## 2019-04-30 DIAGNOSIS — J449 Chronic obstructive pulmonary disease, unspecified: Secondary | ICD-10-CM | POA: Diagnosis not present

## 2019-04-30 DIAGNOSIS — J45909 Unspecified asthma, uncomplicated: Secondary | ICD-10-CM | POA: Diagnosis not present

## 2019-04-30 DIAGNOSIS — R279 Unspecified lack of coordination: Secondary | ICD-10-CM | POA: Diagnosis not present

## 2019-04-30 DIAGNOSIS — M6281 Muscle weakness (generalized): Secondary | ICD-10-CM | POA: Diagnosis not present

## 2019-05-01 DIAGNOSIS — R279 Unspecified lack of coordination: Secondary | ICD-10-CM | POA: Diagnosis not present

## 2019-05-01 DIAGNOSIS — Z9181 History of falling: Secondary | ICD-10-CM | POA: Diagnosis not present

## 2019-05-01 DIAGNOSIS — J449 Chronic obstructive pulmonary disease, unspecified: Secondary | ICD-10-CM | POA: Diagnosis not present

## 2019-05-01 DIAGNOSIS — J45909 Unspecified asthma, uncomplicated: Secondary | ICD-10-CM | POA: Diagnosis not present

## 2019-05-01 DIAGNOSIS — Z1159 Encounter for screening for other viral diseases: Secondary | ICD-10-CM | POA: Diagnosis not present

## 2019-05-01 DIAGNOSIS — M6281 Muscle weakness (generalized): Secondary | ICD-10-CM | POA: Diagnosis not present

## 2019-05-01 DIAGNOSIS — G309 Alzheimer's disease, unspecified: Secondary | ICD-10-CM | POA: Diagnosis not present

## 2019-05-02 DIAGNOSIS — J45909 Unspecified asthma, uncomplicated: Secondary | ICD-10-CM | POA: Diagnosis not present

## 2019-05-02 DIAGNOSIS — M6281 Muscle weakness (generalized): Secondary | ICD-10-CM | POA: Diagnosis not present

## 2019-05-02 DIAGNOSIS — G309 Alzheimer's disease, unspecified: Secondary | ICD-10-CM | POA: Diagnosis not present

## 2019-05-02 DIAGNOSIS — J449 Chronic obstructive pulmonary disease, unspecified: Secondary | ICD-10-CM | POA: Diagnosis not present

## 2019-05-02 DIAGNOSIS — R279 Unspecified lack of coordination: Secondary | ICD-10-CM | POA: Diagnosis not present

## 2019-05-02 DIAGNOSIS — Z9181 History of falling: Secondary | ICD-10-CM | POA: Diagnosis not present

## 2019-05-03 DIAGNOSIS — J45909 Unspecified asthma, uncomplicated: Secondary | ICD-10-CM | POA: Diagnosis not present

## 2019-05-03 DIAGNOSIS — R279 Unspecified lack of coordination: Secondary | ICD-10-CM | POA: Diagnosis not present

## 2019-05-03 DIAGNOSIS — M6281 Muscle weakness (generalized): Secondary | ICD-10-CM | POA: Diagnosis not present

## 2019-05-03 DIAGNOSIS — G309 Alzheimer's disease, unspecified: Secondary | ICD-10-CM | POA: Diagnosis not present

## 2019-05-03 DIAGNOSIS — J449 Chronic obstructive pulmonary disease, unspecified: Secondary | ICD-10-CM | POA: Diagnosis not present

## 2019-05-03 DIAGNOSIS — Z9181 History of falling: Secondary | ICD-10-CM | POA: Diagnosis not present

## 2019-05-03 DIAGNOSIS — Z1159 Encounter for screening for other viral diseases: Secondary | ICD-10-CM | POA: Diagnosis not present

## 2019-05-04 DIAGNOSIS — R279 Unspecified lack of coordination: Secondary | ICD-10-CM | POA: Diagnosis not present

## 2019-05-04 DIAGNOSIS — G309 Alzheimer's disease, unspecified: Secondary | ICD-10-CM | POA: Diagnosis not present

## 2019-05-04 DIAGNOSIS — M6281 Muscle weakness (generalized): Secondary | ICD-10-CM | POA: Diagnosis not present

## 2019-05-04 DIAGNOSIS — Z9181 History of falling: Secondary | ICD-10-CM | POA: Diagnosis not present

## 2019-05-04 DIAGNOSIS — J45909 Unspecified asthma, uncomplicated: Secondary | ICD-10-CM | POA: Diagnosis not present

## 2019-05-04 DIAGNOSIS — J449 Chronic obstructive pulmonary disease, unspecified: Secondary | ICD-10-CM | POA: Diagnosis not present

## 2019-05-07 DIAGNOSIS — R279 Unspecified lack of coordination: Secondary | ICD-10-CM | POA: Diagnosis not present

## 2019-05-07 DIAGNOSIS — Z9181 History of falling: Secondary | ICD-10-CM | POA: Diagnosis not present

## 2019-05-07 DIAGNOSIS — Z1159 Encounter for screening for other viral diseases: Secondary | ICD-10-CM | POA: Diagnosis not present

## 2019-05-07 DIAGNOSIS — J449 Chronic obstructive pulmonary disease, unspecified: Secondary | ICD-10-CM | POA: Diagnosis not present

## 2019-05-07 DIAGNOSIS — J45909 Unspecified asthma, uncomplicated: Secondary | ICD-10-CM | POA: Diagnosis not present

## 2019-05-07 DIAGNOSIS — G309 Alzheimer's disease, unspecified: Secondary | ICD-10-CM | POA: Diagnosis not present

## 2019-05-07 DIAGNOSIS — M6281 Muscle weakness (generalized): Secondary | ICD-10-CM | POA: Diagnosis not present

## 2019-05-08 DIAGNOSIS — J45909 Unspecified asthma, uncomplicated: Secondary | ICD-10-CM | POA: Diagnosis not present

## 2019-05-08 DIAGNOSIS — J449 Chronic obstructive pulmonary disease, unspecified: Secondary | ICD-10-CM | POA: Diagnosis not present

## 2019-05-08 DIAGNOSIS — M6281 Muscle weakness (generalized): Secondary | ICD-10-CM | POA: Diagnosis not present

## 2019-05-08 DIAGNOSIS — R279 Unspecified lack of coordination: Secondary | ICD-10-CM | POA: Diagnosis not present

## 2019-05-08 DIAGNOSIS — Z9181 History of falling: Secondary | ICD-10-CM | POA: Diagnosis not present

## 2019-05-08 DIAGNOSIS — G309 Alzheimer's disease, unspecified: Secondary | ICD-10-CM | POA: Diagnosis not present

## 2019-05-09 DIAGNOSIS — R279 Unspecified lack of coordination: Secondary | ICD-10-CM | POA: Diagnosis not present

## 2019-05-09 DIAGNOSIS — J449 Chronic obstructive pulmonary disease, unspecified: Secondary | ICD-10-CM | POA: Diagnosis not present

## 2019-05-09 DIAGNOSIS — J45909 Unspecified asthma, uncomplicated: Secondary | ICD-10-CM | POA: Diagnosis not present

## 2019-05-09 DIAGNOSIS — G309 Alzheimer's disease, unspecified: Secondary | ICD-10-CM | POA: Diagnosis not present

## 2019-05-09 DIAGNOSIS — M6281 Muscle weakness (generalized): Secondary | ICD-10-CM | POA: Diagnosis not present

## 2019-05-09 DIAGNOSIS — Z9181 History of falling: Secondary | ICD-10-CM | POA: Diagnosis not present

## 2019-05-10 DIAGNOSIS — Z9181 History of falling: Secondary | ICD-10-CM | POA: Diagnosis not present

## 2019-05-10 DIAGNOSIS — J45909 Unspecified asthma, uncomplicated: Secondary | ICD-10-CM | POA: Diagnosis not present

## 2019-05-10 DIAGNOSIS — M6281 Muscle weakness (generalized): Secondary | ICD-10-CM | POA: Diagnosis not present

## 2019-05-10 DIAGNOSIS — J449 Chronic obstructive pulmonary disease, unspecified: Secondary | ICD-10-CM | POA: Diagnosis not present

## 2019-05-10 DIAGNOSIS — G309 Alzheimer's disease, unspecified: Secondary | ICD-10-CM | POA: Diagnosis not present

## 2019-05-10 DIAGNOSIS — G8929 Other chronic pain: Secondary | ICD-10-CM | POA: Diagnosis not present

## 2019-05-10 DIAGNOSIS — R279 Unspecified lack of coordination: Secondary | ICD-10-CM | POA: Diagnosis not present

## 2019-05-11 DIAGNOSIS — J45909 Unspecified asthma, uncomplicated: Secondary | ICD-10-CM | POA: Diagnosis not present

## 2019-05-11 DIAGNOSIS — G309 Alzheimer's disease, unspecified: Secondary | ICD-10-CM | POA: Diagnosis not present

## 2019-05-11 DIAGNOSIS — Z9181 History of falling: Secondary | ICD-10-CM | POA: Diagnosis not present

## 2019-05-11 DIAGNOSIS — M6281 Muscle weakness (generalized): Secondary | ICD-10-CM | POA: Diagnosis not present

## 2019-05-11 DIAGNOSIS — J449 Chronic obstructive pulmonary disease, unspecified: Secondary | ICD-10-CM | POA: Diagnosis not present

## 2019-05-11 DIAGNOSIS — R279 Unspecified lack of coordination: Secondary | ICD-10-CM | POA: Diagnosis not present

## 2019-05-14 DIAGNOSIS — J449 Chronic obstructive pulmonary disease, unspecified: Secondary | ICD-10-CM | POA: Diagnosis not present

## 2019-05-14 DIAGNOSIS — J45909 Unspecified asthma, uncomplicated: Secondary | ICD-10-CM | POA: Diagnosis not present

## 2019-05-14 DIAGNOSIS — Z9181 History of falling: Secondary | ICD-10-CM | POA: Diagnosis not present

## 2019-05-14 DIAGNOSIS — G309 Alzheimer's disease, unspecified: Secondary | ICD-10-CM | POA: Diagnosis not present

## 2019-05-14 DIAGNOSIS — M6281 Muscle weakness (generalized): Secondary | ICD-10-CM | POA: Diagnosis not present

## 2019-05-14 DIAGNOSIS — Z1159 Encounter for screening for other viral diseases: Secondary | ICD-10-CM | POA: Diagnosis not present

## 2019-05-14 DIAGNOSIS — R279 Unspecified lack of coordination: Secondary | ICD-10-CM | POA: Diagnosis not present

## 2019-05-15 DIAGNOSIS — G4701 Insomnia due to medical condition: Secondary | ICD-10-CM | POA: Diagnosis not present

## 2019-05-15 DIAGNOSIS — M6281 Muscle weakness (generalized): Secondary | ICD-10-CM | POA: Diagnosis not present

## 2019-05-15 DIAGNOSIS — Z9181 History of falling: Secondary | ICD-10-CM | POA: Diagnosis not present

## 2019-05-15 DIAGNOSIS — I739 Peripheral vascular disease, unspecified: Secondary | ICD-10-CM | POA: Diagnosis not present

## 2019-05-15 DIAGNOSIS — Q845 Enlarged and hypertrophic nails: Secondary | ICD-10-CM | POA: Diagnosis not present

## 2019-05-15 DIAGNOSIS — J449 Chronic obstructive pulmonary disease, unspecified: Secondary | ICD-10-CM | POA: Diagnosis not present

## 2019-05-15 DIAGNOSIS — L603 Nail dystrophy: Secondary | ICD-10-CM | POA: Diagnosis not present

## 2019-05-15 DIAGNOSIS — R279 Unspecified lack of coordination: Secondary | ICD-10-CM | POA: Diagnosis not present

## 2019-05-15 DIAGNOSIS — G4700 Insomnia, unspecified: Secondary | ICD-10-CM | POA: Diagnosis not present

## 2019-05-15 DIAGNOSIS — G309 Alzheimer's disease, unspecified: Secondary | ICD-10-CM | POA: Diagnosis not present

## 2019-05-15 DIAGNOSIS — J309 Allergic rhinitis, unspecified: Secondary | ICD-10-CM | POA: Diagnosis not present

## 2019-05-15 DIAGNOSIS — J45909 Unspecified asthma, uncomplicated: Secondary | ICD-10-CM | POA: Diagnosis not present

## 2019-05-15 DIAGNOSIS — E559 Vitamin D deficiency, unspecified: Secondary | ICD-10-CM | POA: Diagnosis not present

## 2019-05-15 DIAGNOSIS — F339 Major depressive disorder, recurrent, unspecified: Secondary | ICD-10-CM | POA: Diagnosis not present

## 2019-05-15 DIAGNOSIS — K5900 Constipation, unspecified: Secondary | ICD-10-CM | POA: Diagnosis not present

## 2019-05-15 DIAGNOSIS — F0391 Unspecified dementia with behavioral disturbance: Secondary | ICD-10-CM | POA: Diagnosis not present

## 2019-05-16 DIAGNOSIS — M6281 Muscle weakness (generalized): Secondary | ICD-10-CM | POA: Diagnosis not present

## 2019-05-16 DIAGNOSIS — G309 Alzheimer's disease, unspecified: Secondary | ICD-10-CM | POA: Diagnosis not present

## 2019-05-16 DIAGNOSIS — J449 Chronic obstructive pulmonary disease, unspecified: Secondary | ICD-10-CM | POA: Diagnosis not present

## 2019-05-16 DIAGNOSIS — R222 Localized swelling, mass and lump, trunk: Secondary | ICD-10-CM | POA: Diagnosis not present

## 2019-05-16 DIAGNOSIS — J45909 Unspecified asthma, uncomplicated: Secondary | ICD-10-CM | POA: Diagnosis not present

## 2019-05-16 DIAGNOSIS — Z9181 History of falling: Secondary | ICD-10-CM | POA: Diagnosis not present

## 2019-05-16 DIAGNOSIS — R279 Unspecified lack of coordination: Secondary | ICD-10-CM | POA: Diagnosis not present

## 2019-05-17 DIAGNOSIS — G309 Alzheimer's disease, unspecified: Secondary | ICD-10-CM | POA: Diagnosis not present

## 2019-05-17 DIAGNOSIS — J45909 Unspecified asthma, uncomplicated: Secondary | ICD-10-CM | POA: Diagnosis not present

## 2019-05-17 DIAGNOSIS — Z9181 History of falling: Secondary | ICD-10-CM | POA: Diagnosis not present

## 2019-05-17 DIAGNOSIS — J449 Chronic obstructive pulmonary disease, unspecified: Secondary | ICD-10-CM | POA: Diagnosis not present

## 2019-05-17 DIAGNOSIS — M6281 Muscle weakness (generalized): Secondary | ICD-10-CM | POA: Diagnosis not present

## 2019-05-17 DIAGNOSIS — Z1159 Encounter for screening for other viral diseases: Secondary | ICD-10-CM | POA: Diagnosis not present

## 2019-05-17 DIAGNOSIS — R279 Unspecified lack of coordination: Secondary | ICD-10-CM | POA: Diagnosis not present

## 2019-05-18 DIAGNOSIS — R279 Unspecified lack of coordination: Secondary | ICD-10-CM | POA: Diagnosis not present

## 2019-05-18 DIAGNOSIS — M6281 Muscle weakness (generalized): Secondary | ICD-10-CM | POA: Diagnosis not present

## 2019-05-18 DIAGNOSIS — J449 Chronic obstructive pulmonary disease, unspecified: Secondary | ICD-10-CM | POA: Diagnosis not present

## 2019-05-18 DIAGNOSIS — G309 Alzheimer's disease, unspecified: Secondary | ICD-10-CM | POA: Diagnosis not present

## 2019-05-18 DIAGNOSIS — J45909 Unspecified asthma, uncomplicated: Secondary | ICD-10-CM | POA: Diagnosis not present

## 2019-05-18 DIAGNOSIS — Z9181 History of falling: Secondary | ICD-10-CM | POA: Diagnosis not present

## 2019-05-21 DIAGNOSIS — S32501D Unspecified fracture of right pubis, subsequent encounter for fracture with routine healing: Secondary | ICD-10-CM | POA: Diagnosis not present

## 2019-05-21 DIAGNOSIS — F0280 Dementia in other diseases classified elsewhere without behavioral disturbance: Secondary | ICD-10-CM | POA: Diagnosis not present

## 2019-05-21 DIAGNOSIS — K862 Cyst of pancreas: Secondary | ICD-10-CM | POA: Diagnosis not present

## 2019-05-21 DIAGNOSIS — J449 Chronic obstructive pulmonary disease, unspecified: Secondary | ICD-10-CM | POA: Diagnosis not present

## 2019-05-21 DIAGNOSIS — M6281 Muscle weakness (generalized): Secondary | ICD-10-CM | POA: Diagnosis not present

## 2019-05-21 DIAGNOSIS — R32 Unspecified urinary incontinence: Secondary | ICD-10-CM | POA: Diagnosis not present

## 2019-05-21 DIAGNOSIS — R279 Unspecified lack of coordination: Secondary | ICD-10-CM | POA: Diagnosis not present

## 2019-05-21 DIAGNOSIS — G309 Alzheimer's disease, unspecified: Secondary | ICD-10-CM | POA: Diagnosis not present

## 2019-05-21 DIAGNOSIS — Z1159 Encounter for screening for other viral diseases: Secondary | ICD-10-CM | POA: Diagnosis not present

## 2019-05-21 DIAGNOSIS — F339 Major depressive disorder, recurrent, unspecified: Secondary | ICD-10-CM | POA: Diagnosis not present

## 2019-05-21 DIAGNOSIS — N179 Acute kidney failure, unspecified: Secondary | ICD-10-CM | POA: Diagnosis not present

## 2019-05-21 DIAGNOSIS — Z9181 History of falling: Secondary | ICD-10-CM | POA: Diagnosis not present

## 2019-05-21 DIAGNOSIS — J45909 Unspecified asthma, uncomplicated: Secondary | ICD-10-CM | POA: Diagnosis not present

## 2019-05-22 DIAGNOSIS — J449 Chronic obstructive pulmonary disease, unspecified: Secondary | ICD-10-CM | POA: Diagnosis not present

## 2019-05-22 DIAGNOSIS — Q845 Enlarged and hypertrophic nails: Secondary | ICD-10-CM | POA: Diagnosis not present

## 2019-05-22 DIAGNOSIS — Z9181 History of falling: Secondary | ICD-10-CM | POA: Diagnosis not present

## 2019-05-22 DIAGNOSIS — L603 Nail dystrophy: Secondary | ICD-10-CM | POA: Diagnosis not present

## 2019-05-22 DIAGNOSIS — I739 Peripheral vascular disease, unspecified: Secondary | ICD-10-CM | POA: Diagnosis not present

## 2019-05-22 DIAGNOSIS — R279 Unspecified lack of coordination: Secondary | ICD-10-CM | POA: Diagnosis not present

## 2019-05-22 DIAGNOSIS — B351 Tinea unguium: Secondary | ICD-10-CM | POA: Diagnosis not present

## 2019-05-22 DIAGNOSIS — M6281 Muscle weakness (generalized): Secondary | ICD-10-CM | POA: Diagnosis not present

## 2019-05-22 DIAGNOSIS — G309 Alzheimer's disease, unspecified: Secondary | ICD-10-CM | POA: Diagnosis not present

## 2019-05-22 DIAGNOSIS — J45909 Unspecified asthma, uncomplicated: Secondary | ICD-10-CM | POA: Diagnosis not present

## 2019-05-23 DIAGNOSIS — R279 Unspecified lack of coordination: Secondary | ICD-10-CM | POA: Diagnosis not present

## 2019-05-23 DIAGNOSIS — J449 Chronic obstructive pulmonary disease, unspecified: Secondary | ICD-10-CM | POA: Diagnosis not present

## 2019-05-23 DIAGNOSIS — Z9181 History of falling: Secondary | ICD-10-CM | POA: Diagnosis not present

## 2019-05-23 DIAGNOSIS — M6281 Muscle weakness (generalized): Secondary | ICD-10-CM | POA: Diagnosis not present

## 2019-05-23 DIAGNOSIS — J45909 Unspecified asthma, uncomplicated: Secondary | ICD-10-CM | POA: Diagnosis not present

## 2019-05-23 DIAGNOSIS — G309 Alzheimer's disease, unspecified: Secondary | ICD-10-CM | POA: Diagnosis not present

## 2019-05-24 DIAGNOSIS — G309 Alzheimer's disease, unspecified: Secondary | ICD-10-CM | POA: Diagnosis not present

## 2019-05-24 DIAGNOSIS — Z1159 Encounter for screening for other viral diseases: Secondary | ICD-10-CM | POA: Diagnosis not present

## 2019-05-24 DIAGNOSIS — M6281 Muscle weakness (generalized): Secondary | ICD-10-CM | POA: Diagnosis not present

## 2019-05-24 DIAGNOSIS — J449 Chronic obstructive pulmonary disease, unspecified: Secondary | ICD-10-CM | POA: Diagnosis not present

## 2019-05-24 DIAGNOSIS — R279 Unspecified lack of coordination: Secondary | ICD-10-CM | POA: Diagnosis not present

## 2019-05-24 DIAGNOSIS — J45909 Unspecified asthma, uncomplicated: Secondary | ICD-10-CM | POA: Diagnosis not present

## 2019-05-24 DIAGNOSIS — Z9181 History of falling: Secondary | ICD-10-CM | POA: Diagnosis not present

## 2019-05-25 DIAGNOSIS — J45909 Unspecified asthma, uncomplicated: Secondary | ICD-10-CM | POA: Diagnosis not present

## 2019-05-25 DIAGNOSIS — F0280 Dementia in other diseases classified elsewhere without behavioral disturbance: Secondary | ICD-10-CM | POA: Diagnosis not present

## 2019-05-25 DIAGNOSIS — R279 Unspecified lack of coordination: Secondary | ICD-10-CM | POA: Diagnosis not present

## 2019-05-25 DIAGNOSIS — J449 Chronic obstructive pulmonary disease, unspecified: Secondary | ICD-10-CM | POA: Diagnosis not present

## 2019-05-25 DIAGNOSIS — G309 Alzheimer's disease, unspecified: Secondary | ICD-10-CM | POA: Diagnosis not present

## 2019-05-25 DIAGNOSIS — M6281 Muscle weakness (generalized): Secondary | ICD-10-CM | POA: Diagnosis not present

## 2019-05-25 DIAGNOSIS — G301 Alzheimer's disease with late onset: Secondary | ICD-10-CM | POA: Diagnosis not present

## 2019-05-25 DIAGNOSIS — Z9181 History of falling: Secondary | ICD-10-CM | POA: Diagnosis not present

## 2019-05-25 DIAGNOSIS — F330 Major depressive disorder, recurrent, mild: Secondary | ICD-10-CM | POA: Diagnosis not present

## 2019-05-28 DIAGNOSIS — J449 Chronic obstructive pulmonary disease, unspecified: Secondary | ICD-10-CM | POA: Diagnosis not present

## 2019-05-28 DIAGNOSIS — G309 Alzheimer's disease, unspecified: Secondary | ICD-10-CM | POA: Diagnosis not present

## 2019-05-28 DIAGNOSIS — Z9181 History of falling: Secondary | ICD-10-CM | POA: Diagnosis not present

## 2019-05-28 DIAGNOSIS — J45909 Unspecified asthma, uncomplicated: Secondary | ICD-10-CM | POA: Diagnosis not present

## 2019-05-28 DIAGNOSIS — M6281 Muscle weakness (generalized): Secondary | ICD-10-CM | POA: Diagnosis not present

## 2019-05-28 DIAGNOSIS — R279 Unspecified lack of coordination: Secondary | ICD-10-CM | POA: Diagnosis not present

## 2019-05-28 DIAGNOSIS — Z1159 Encounter for screening for other viral diseases: Secondary | ICD-10-CM | POA: Diagnosis not present

## 2019-05-29 DIAGNOSIS — J449 Chronic obstructive pulmonary disease, unspecified: Secondary | ICD-10-CM | POA: Diagnosis not present

## 2019-05-29 DIAGNOSIS — Z9181 History of falling: Secondary | ICD-10-CM | POA: Diagnosis not present

## 2019-05-29 DIAGNOSIS — R279 Unspecified lack of coordination: Secondary | ICD-10-CM | POA: Diagnosis not present

## 2019-05-29 DIAGNOSIS — J45909 Unspecified asthma, uncomplicated: Secondary | ICD-10-CM | POA: Diagnosis not present

## 2019-05-29 DIAGNOSIS — M6281 Muscle weakness (generalized): Secondary | ICD-10-CM | POA: Diagnosis not present

## 2019-05-29 DIAGNOSIS — G309 Alzheimer's disease, unspecified: Secondary | ICD-10-CM | POA: Diagnosis not present

## 2019-05-30 DIAGNOSIS — G309 Alzheimer's disease, unspecified: Secondary | ICD-10-CM | POA: Diagnosis not present

## 2019-05-30 DIAGNOSIS — M6281 Muscle weakness (generalized): Secondary | ICD-10-CM | POA: Diagnosis not present

## 2019-05-30 DIAGNOSIS — J45909 Unspecified asthma, uncomplicated: Secondary | ICD-10-CM | POA: Diagnosis not present

## 2019-05-30 DIAGNOSIS — R279 Unspecified lack of coordination: Secondary | ICD-10-CM | POA: Diagnosis not present

## 2019-05-30 DIAGNOSIS — J449 Chronic obstructive pulmonary disease, unspecified: Secondary | ICD-10-CM | POA: Diagnosis not present

## 2019-05-30 DIAGNOSIS — Z9181 History of falling: Secondary | ICD-10-CM | POA: Diagnosis not present

## 2019-05-31 DIAGNOSIS — M6281 Muscle weakness (generalized): Secondary | ICD-10-CM | POA: Diagnosis not present

## 2019-05-31 DIAGNOSIS — Z9181 History of falling: Secondary | ICD-10-CM | POA: Diagnosis not present

## 2019-05-31 DIAGNOSIS — Z1159 Encounter for screening for other viral diseases: Secondary | ICD-10-CM | POA: Diagnosis not present

## 2019-05-31 DIAGNOSIS — G309 Alzheimer's disease, unspecified: Secondary | ICD-10-CM | POA: Diagnosis not present

## 2019-05-31 DIAGNOSIS — J449 Chronic obstructive pulmonary disease, unspecified: Secondary | ICD-10-CM | POA: Diagnosis not present

## 2019-05-31 DIAGNOSIS — R279 Unspecified lack of coordination: Secondary | ICD-10-CM | POA: Diagnosis not present

## 2019-05-31 DIAGNOSIS — J45909 Unspecified asthma, uncomplicated: Secondary | ICD-10-CM | POA: Diagnosis not present

## 2019-06-01 DIAGNOSIS — J449 Chronic obstructive pulmonary disease, unspecified: Secondary | ICD-10-CM | POA: Diagnosis not present

## 2019-06-01 DIAGNOSIS — G309 Alzheimer's disease, unspecified: Secondary | ICD-10-CM | POA: Diagnosis not present

## 2019-06-01 DIAGNOSIS — R279 Unspecified lack of coordination: Secondary | ICD-10-CM | POA: Diagnosis not present

## 2019-06-01 DIAGNOSIS — J45909 Unspecified asthma, uncomplicated: Secondary | ICD-10-CM | POA: Diagnosis not present

## 2019-06-01 DIAGNOSIS — M6281 Muscle weakness (generalized): Secondary | ICD-10-CM | POA: Diagnosis not present

## 2019-06-01 DIAGNOSIS — Z9181 History of falling: Secondary | ICD-10-CM | POA: Diagnosis not present

## 2019-06-04 DIAGNOSIS — R279 Unspecified lack of coordination: Secondary | ICD-10-CM | POA: Diagnosis not present

## 2019-06-04 DIAGNOSIS — G309 Alzheimer's disease, unspecified: Secondary | ICD-10-CM | POA: Diagnosis not present

## 2019-06-04 DIAGNOSIS — Z1159 Encounter for screening for other viral diseases: Secondary | ICD-10-CM | POA: Diagnosis not present

## 2019-06-04 DIAGNOSIS — J45909 Unspecified asthma, uncomplicated: Secondary | ICD-10-CM | POA: Diagnosis not present

## 2019-06-04 DIAGNOSIS — Z9181 History of falling: Secondary | ICD-10-CM | POA: Diagnosis not present

## 2019-06-04 DIAGNOSIS — M6281 Muscle weakness (generalized): Secondary | ICD-10-CM | POA: Diagnosis not present

## 2019-06-04 DIAGNOSIS — J449 Chronic obstructive pulmonary disease, unspecified: Secondary | ICD-10-CM | POA: Diagnosis not present

## 2019-06-05 DIAGNOSIS — G309 Alzheimer's disease, unspecified: Secondary | ICD-10-CM | POA: Diagnosis not present

## 2019-06-05 DIAGNOSIS — M6281 Muscle weakness (generalized): Secondary | ICD-10-CM | POA: Diagnosis not present

## 2019-06-05 DIAGNOSIS — J449 Chronic obstructive pulmonary disease, unspecified: Secondary | ICD-10-CM | POA: Diagnosis not present

## 2019-06-05 DIAGNOSIS — J45909 Unspecified asthma, uncomplicated: Secondary | ICD-10-CM | POA: Diagnosis not present

## 2019-06-05 DIAGNOSIS — Z9181 History of falling: Secondary | ICD-10-CM | POA: Diagnosis not present

## 2019-06-05 DIAGNOSIS — R279 Unspecified lack of coordination: Secondary | ICD-10-CM | POA: Diagnosis not present

## 2019-06-06 DIAGNOSIS — J449 Chronic obstructive pulmonary disease, unspecified: Secondary | ICD-10-CM | POA: Diagnosis not present

## 2019-06-06 DIAGNOSIS — J45909 Unspecified asthma, uncomplicated: Secondary | ICD-10-CM | POA: Diagnosis not present

## 2019-06-06 DIAGNOSIS — G309 Alzheimer's disease, unspecified: Secondary | ICD-10-CM | POA: Diagnosis not present

## 2019-06-06 DIAGNOSIS — Z9181 History of falling: Secondary | ICD-10-CM | POA: Diagnosis not present

## 2019-06-06 DIAGNOSIS — M6281 Muscle weakness (generalized): Secondary | ICD-10-CM | POA: Diagnosis not present

## 2019-06-06 DIAGNOSIS — R279 Unspecified lack of coordination: Secondary | ICD-10-CM | POA: Diagnosis not present

## 2019-06-07 DIAGNOSIS — G309 Alzheimer's disease, unspecified: Secondary | ICD-10-CM | POA: Diagnosis not present

## 2019-06-07 DIAGNOSIS — Z1159 Encounter for screening for other viral diseases: Secondary | ICD-10-CM | POA: Diagnosis not present

## 2019-06-07 DIAGNOSIS — J449 Chronic obstructive pulmonary disease, unspecified: Secondary | ICD-10-CM | POA: Diagnosis not present

## 2019-06-07 DIAGNOSIS — M6281 Muscle weakness (generalized): Secondary | ICD-10-CM | POA: Diagnosis not present

## 2019-06-07 DIAGNOSIS — R279 Unspecified lack of coordination: Secondary | ICD-10-CM | POA: Diagnosis not present

## 2019-06-07 DIAGNOSIS — J45909 Unspecified asthma, uncomplicated: Secondary | ICD-10-CM | POA: Diagnosis not present

## 2019-06-07 DIAGNOSIS — Z9181 History of falling: Secondary | ICD-10-CM | POA: Diagnosis not present

## 2019-06-08 DIAGNOSIS — Z9181 History of falling: Secondary | ICD-10-CM | POA: Diagnosis not present

## 2019-06-08 DIAGNOSIS — M6281 Muscle weakness (generalized): Secondary | ICD-10-CM | POA: Diagnosis not present

## 2019-06-08 DIAGNOSIS — G309 Alzheimer's disease, unspecified: Secondary | ICD-10-CM | POA: Diagnosis not present

## 2019-06-08 DIAGNOSIS — J449 Chronic obstructive pulmonary disease, unspecified: Secondary | ICD-10-CM | POA: Diagnosis not present

## 2019-06-08 DIAGNOSIS — R279 Unspecified lack of coordination: Secondary | ICD-10-CM | POA: Diagnosis not present

## 2019-06-08 DIAGNOSIS — J45909 Unspecified asthma, uncomplicated: Secondary | ICD-10-CM | POA: Diagnosis not present

## 2019-06-11 DIAGNOSIS — F330 Major depressive disorder, recurrent, mild: Secondary | ICD-10-CM | POA: Diagnosis not present

## 2019-06-11 DIAGNOSIS — F0280 Dementia in other diseases classified elsewhere without behavioral disturbance: Secondary | ICD-10-CM | POA: Diagnosis not present

## 2019-06-11 DIAGNOSIS — J449 Chronic obstructive pulmonary disease, unspecified: Secondary | ICD-10-CM | POA: Diagnosis not present

## 2019-06-11 DIAGNOSIS — G309 Alzheimer's disease, unspecified: Secondary | ICD-10-CM | POA: Diagnosis not present

## 2019-06-11 DIAGNOSIS — M6281 Muscle weakness (generalized): Secondary | ICD-10-CM | POA: Diagnosis not present

## 2019-06-11 DIAGNOSIS — R279 Unspecified lack of coordination: Secondary | ICD-10-CM | POA: Diagnosis not present

## 2019-06-11 DIAGNOSIS — Z1159 Encounter for screening for other viral diseases: Secondary | ICD-10-CM | POA: Diagnosis not present

## 2019-06-11 DIAGNOSIS — Z9181 History of falling: Secondary | ICD-10-CM | POA: Diagnosis not present

## 2019-06-11 DIAGNOSIS — J45909 Unspecified asthma, uncomplicated: Secondary | ICD-10-CM | POA: Diagnosis not present

## 2019-06-11 DIAGNOSIS — G301 Alzheimer's disease with late onset: Secondary | ICD-10-CM | POA: Diagnosis not present

## 2019-06-12 DIAGNOSIS — Z9181 History of falling: Secondary | ICD-10-CM | POA: Diagnosis not present

## 2019-06-12 DIAGNOSIS — G309 Alzheimer's disease, unspecified: Secondary | ICD-10-CM | POA: Diagnosis not present

## 2019-06-12 DIAGNOSIS — J449 Chronic obstructive pulmonary disease, unspecified: Secondary | ICD-10-CM | POA: Diagnosis not present

## 2019-06-12 DIAGNOSIS — M6281 Muscle weakness (generalized): Secondary | ICD-10-CM | POA: Diagnosis not present

## 2019-06-12 DIAGNOSIS — R279 Unspecified lack of coordination: Secondary | ICD-10-CM | POA: Diagnosis not present

## 2019-06-12 DIAGNOSIS — J45909 Unspecified asthma, uncomplicated: Secondary | ICD-10-CM | POA: Diagnosis not present

## 2019-06-13 DIAGNOSIS — R279 Unspecified lack of coordination: Secondary | ICD-10-CM | POA: Diagnosis not present

## 2019-06-13 DIAGNOSIS — Z9181 History of falling: Secondary | ICD-10-CM | POA: Diagnosis not present

## 2019-06-13 DIAGNOSIS — J45909 Unspecified asthma, uncomplicated: Secondary | ICD-10-CM | POA: Diagnosis not present

## 2019-06-13 DIAGNOSIS — G309 Alzheimer's disease, unspecified: Secondary | ICD-10-CM | POA: Diagnosis not present

## 2019-06-13 DIAGNOSIS — J449 Chronic obstructive pulmonary disease, unspecified: Secondary | ICD-10-CM | POA: Diagnosis not present

## 2019-06-13 DIAGNOSIS — M6281 Muscle weakness (generalized): Secondary | ICD-10-CM | POA: Diagnosis not present

## 2019-06-14 DIAGNOSIS — Z1159 Encounter for screening for other viral diseases: Secondary | ICD-10-CM | POA: Diagnosis not present

## 2019-06-18 DIAGNOSIS — Z1159 Encounter for screening for other viral diseases: Secondary | ICD-10-CM | POA: Diagnosis not present

## 2019-06-21 DIAGNOSIS — Z1159 Encounter for screening for other viral diseases: Secondary | ICD-10-CM | POA: Diagnosis not present

## 2019-06-25 DIAGNOSIS — G301 Alzheimer's disease with late onset: Secondary | ICD-10-CM | POA: Diagnosis not present

## 2019-06-25 DIAGNOSIS — F0280 Dementia in other diseases classified elsewhere without behavioral disturbance: Secondary | ICD-10-CM | POA: Diagnosis not present

## 2019-06-25 DIAGNOSIS — Z1159 Encounter for screening for other viral diseases: Secondary | ICD-10-CM | POA: Diagnosis not present

## 2019-06-25 DIAGNOSIS — F330 Major depressive disorder, recurrent, mild: Secondary | ICD-10-CM | POA: Diagnosis not present

## 2019-06-28 DIAGNOSIS — Z1159 Encounter for screening for other viral diseases: Secondary | ICD-10-CM | POA: Diagnosis not present

## 2019-07-03 DIAGNOSIS — Z1159 Encounter for screening for other viral diseases: Secondary | ICD-10-CM | POA: Diagnosis not present

## 2019-07-05 DIAGNOSIS — Z1159 Encounter for screening for other viral diseases: Secondary | ICD-10-CM | POA: Diagnosis not present

## 2019-07-09 DIAGNOSIS — Z1159 Encounter for screening for other viral diseases: Secondary | ICD-10-CM | POA: Diagnosis not present

## 2019-07-12 DIAGNOSIS — Z1159 Encounter for screening for other viral diseases: Secondary | ICD-10-CM | POA: Diagnosis not present

## 2019-07-16 DIAGNOSIS — Z1159 Encounter for screening for other viral diseases: Secondary | ICD-10-CM | POA: Diagnosis not present

## 2019-07-19 DIAGNOSIS — Z1159 Encounter for screening for other viral diseases: Secondary | ICD-10-CM | POA: Diagnosis not present

## 2019-07-23 DIAGNOSIS — Z1159 Encounter for screening for other viral diseases: Secondary | ICD-10-CM | POA: Diagnosis not present

## 2019-07-23 DIAGNOSIS — F330 Major depressive disorder, recurrent, mild: Secondary | ICD-10-CM | POA: Diagnosis not present

## 2019-07-23 DIAGNOSIS — G301 Alzheimer's disease with late onset: Secondary | ICD-10-CM | POA: Diagnosis not present

## 2019-07-23 DIAGNOSIS — F0280 Dementia in other diseases classified elsewhere without behavioral disturbance: Secondary | ICD-10-CM | POA: Diagnosis not present

## 2019-07-23 DIAGNOSIS — G8929 Other chronic pain: Secondary | ICD-10-CM | POA: Diagnosis not present

## 2019-07-26 DIAGNOSIS — Z1159 Encounter for screening for other viral diseases: Secondary | ICD-10-CM | POA: Diagnosis not present

## 2019-07-30 DIAGNOSIS — Z1159 Encounter for screening for other viral diseases: Secondary | ICD-10-CM | POA: Diagnosis not present

## 2019-08-02 DIAGNOSIS — Z1159 Encounter for screening for other viral diseases: Secondary | ICD-10-CM | POA: Diagnosis not present

## 2019-08-08 DIAGNOSIS — R0989 Other specified symptoms and signs involving the circulatory and respiratory systems: Secondary | ICD-10-CM | POA: Diagnosis not present

## 2019-08-08 DIAGNOSIS — I10 Essential (primary) hypertension: Secondary | ICD-10-CM | POA: Diagnosis not present

## 2019-08-10 DIAGNOSIS — U071 COVID-19: Secondary | ICD-10-CM | POA: Diagnosis not present

## 2019-08-17 DIAGNOSIS — G8929 Other chronic pain: Secondary | ICD-10-CM | POA: Diagnosis not present

## 2019-08-20 DIAGNOSIS — F0280 Dementia in other diseases classified elsewhere without behavioral disturbance: Secondary | ICD-10-CM | POA: Diagnosis not present

## 2019-08-20 DIAGNOSIS — F330 Major depressive disorder, recurrent, mild: Secondary | ICD-10-CM | POA: Diagnosis not present

## 2019-08-20 DIAGNOSIS — G301 Alzheimer's disease with late onset: Secondary | ICD-10-CM | POA: Diagnosis not present

## 2019-08-21 DIAGNOSIS — G8929 Other chronic pain: Secondary | ICD-10-CM | POA: Diagnosis not present

## 2019-09-04 DIAGNOSIS — F0280 Dementia in other diseases classified elsewhere without behavioral disturbance: Secondary | ICD-10-CM | POA: Diagnosis not present

## 2019-09-04 DIAGNOSIS — D638 Anemia in other chronic diseases classified elsewhere: Secondary | ICD-10-CM | POA: Diagnosis not present

## 2019-09-04 DIAGNOSIS — R1310 Dysphagia, unspecified: Secondary | ICD-10-CM | POA: Diagnosis not present

## 2019-09-04 DIAGNOSIS — R32 Unspecified urinary incontinence: Secondary | ICD-10-CM | POA: Diagnosis not present

## 2019-09-04 DIAGNOSIS — R279 Unspecified lack of coordination: Secondary | ICD-10-CM | POA: Diagnosis not present

## 2019-09-04 DIAGNOSIS — G309 Alzheimer's disease, unspecified: Secondary | ICD-10-CM | POA: Diagnosis not present

## 2019-09-04 DIAGNOSIS — N179 Acute kidney failure, unspecified: Secondary | ICD-10-CM | POA: Diagnosis not present

## 2019-09-04 DIAGNOSIS — J45909 Unspecified asthma, uncomplicated: Secondary | ICD-10-CM | POA: Diagnosis not present

## 2019-09-04 DIAGNOSIS — Z9181 History of falling: Secondary | ICD-10-CM | POA: Diagnosis not present

## 2019-09-04 DIAGNOSIS — I739 Peripheral vascular disease, unspecified: Secondary | ICD-10-CM | POA: Diagnosis not present

## 2019-09-04 DIAGNOSIS — F339 Major depressive disorder, recurrent, unspecified: Secondary | ICD-10-CM | POA: Diagnosis not present

## 2019-09-04 DIAGNOSIS — G8929 Other chronic pain: Secondary | ICD-10-CM | POA: Diagnosis not present

## 2019-09-04 DIAGNOSIS — J449 Chronic obstructive pulmonary disease, unspecified: Secondary | ICD-10-CM | POA: Diagnosis not present

## 2019-09-04 DIAGNOSIS — M6281 Muscle weakness (generalized): Secondary | ICD-10-CM | POA: Diagnosis not present

## 2019-09-04 DIAGNOSIS — J309 Allergic rhinitis, unspecified: Secondary | ICD-10-CM | POA: Diagnosis not present

## 2019-09-04 DIAGNOSIS — N189 Chronic kidney disease, unspecified: Secondary | ICD-10-CM | POA: Diagnosis not present

## 2019-09-04 DIAGNOSIS — J9610 Chronic respiratory failure, unspecified whether with hypoxia or hypercapnia: Secondary | ICD-10-CM | POA: Diagnosis not present

## 2019-09-04 DIAGNOSIS — K862 Cyst of pancreas: Secondary | ICD-10-CM | POA: Diagnosis not present

## 2019-09-05 DIAGNOSIS — J9610 Chronic respiratory failure, unspecified whether with hypoxia or hypercapnia: Secondary | ICD-10-CM | POA: Diagnosis not present

## 2019-09-05 DIAGNOSIS — R279 Unspecified lack of coordination: Secondary | ICD-10-CM | POA: Diagnosis not present

## 2019-09-05 DIAGNOSIS — G309 Alzheimer's disease, unspecified: Secondary | ICD-10-CM | POA: Diagnosis not present

## 2019-09-05 DIAGNOSIS — R32 Unspecified urinary incontinence: Secondary | ICD-10-CM | POA: Diagnosis not present

## 2019-09-05 DIAGNOSIS — J449 Chronic obstructive pulmonary disease, unspecified: Secondary | ICD-10-CM | POA: Diagnosis not present

## 2019-09-05 DIAGNOSIS — J45909 Unspecified asthma, uncomplicated: Secondary | ICD-10-CM | POA: Diagnosis not present

## 2019-09-06 DIAGNOSIS — E612 Magnesium deficiency: Secondary | ICD-10-CM | POA: Diagnosis not present

## 2019-09-06 DIAGNOSIS — G309 Alzheimer's disease, unspecified: Secondary | ICD-10-CM | POA: Diagnosis not present

## 2019-09-06 DIAGNOSIS — J9610 Chronic respiratory failure, unspecified whether with hypoxia or hypercapnia: Secondary | ICD-10-CM | POA: Diagnosis not present

## 2019-09-06 DIAGNOSIS — J45909 Unspecified asthma, uncomplicated: Secondary | ICD-10-CM | POA: Diagnosis not present

## 2019-09-06 DIAGNOSIS — R279 Unspecified lack of coordination: Secondary | ICD-10-CM | POA: Diagnosis not present

## 2019-09-06 DIAGNOSIS — J449 Chronic obstructive pulmonary disease, unspecified: Secondary | ICD-10-CM | POA: Diagnosis not present

## 2019-09-06 DIAGNOSIS — R32 Unspecified urinary incontinence: Secondary | ICD-10-CM | POA: Diagnosis not present

## 2019-09-07 DIAGNOSIS — F339 Major depressive disorder, recurrent, unspecified: Secondary | ICD-10-CM | POA: Diagnosis not present

## 2019-09-07 DIAGNOSIS — G309 Alzheimer's disease, unspecified: Secondary | ICD-10-CM | POA: Diagnosis not present

## 2019-09-07 DIAGNOSIS — G4700 Insomnia, unspecified: Secondary | ICD-10-CM | POA: Diagnosis not present

## 2019-09-07 DIAGNOSIS — R05 Cough: Secondary | ICD-10-CM | POA: Diagnosis not present

## 2019-09-07 DIAGNOSIS — J9610 Chronic respiratory failure, unspecified whether with hypoxia or hypercapnia: Secondary | ICD-10-CM | POA: Diagnosis not present

## 2019-09-07 DIAGNOSIS — K5900 Constipation, unspecified: Secondary | ICD-10-CM | POA: Diagnosis not present

## 2019-09-07 DIAGNOSIS — G4701 Insomnia due to medical condition: Secondary | ICD-10-CM | POA: Diagnosis not present

## 2019-09-07 DIAGNOSIS — R279 Unspecified lack of coordination: Secondary | ICD-10-CM | POA: Diagnosis not present

## 2019-09-07 DIAGNOSIS — E559 Vitamin D deficiency, unspecified: Secondary | ICD-10-CM | POA: Diagnosis not present

## 2019-09-07 DIAGNOSIS — J449 Chronic obstructive pulmonary disease, unspecified: Secondary | ICD-10-CM | POA: Diagnosis not present

## 2019-09-07 DIAGNOSIS — F0391 Unspecified dementia with behavioral disturbance: Secondary | ICD-10-CM | POA: Diagnosis not present

## 2019-09-07 DIAGNOSIS — J309 Allergic rhinitis, unspecified: Secondary | ICD-10-CM | POA: Diagnosis not present

## 2019-09-07 DIAGNOSIS — M6281 Muscle weakness (generalized): Secondary | ICD-10-CM | POA: Diagnosis not present

## 2019-09-07 DIAGNOSIS — J45909 Unspecified asthma, uncomplicated: Secondary | ICD-10-CM | POA: Diagnosis not present

## 2019-09-07 DIAGNOSIS — R32 Unspecified urinary incontinence: Secondary | ICD-10-CM | POA: Diagnosis not present

## 2019-09-10 DIAGNOSIS — R32 Unspecified urinary incontinence: Secondary | ICD-10-CM | POA: Diagnosis not present

## 2019-09-10 DIAGNOSIS — G309 Alzheimer's disease, unspecified: Secondary | ICD-10-CM | POA: Diagnosis not present

## 2019-09-10 DIAGNOSIS — J449 Chronic obstructive pulmonary disease, unspecified: Secondary | ICD-10-CM | POA: Diagnosis not present

## 2019-09-10 DIAGNOSIS — J9610 Chronic respiratory failure, unspecified whether with hypoxia or hypercapnia: Secondary | ICD-10-CM | POA: Diagnosis not present

## 2019-09-10 DIAGNOSIS — J45909 Unspecified asthma, uncomplicated: Secondary | ICD-10-CM | POA: Diagnosis not present

## 2019-09-10 DIAGNOSIS — R279 Unspecified lack of coordination: Secondary | ICD-10-CM | POA: Diagnosis not present

## 2019-09-12 ENCOUNTER — Ambulatory Visit: Payer: Medicare Other | Admitting: Family Medicine

## 2019-09-12 DIAGNOSIS — J9610 Chronic respiratory failure, unspecified whether with hypoxia or hypercapnia: Secondary | ICD-10-CM | POA: Diagnosis not present

## 2019-09-12 DIAGNOSIS — R32 Unspecified urinary incontinence: Secondary | ICD-10-CM | POA: Diagnosis not present

## 2019-09-12 DIAGNOSIS — G309 Alzheimer's disease, unspecified: Secondary | ICD-10-CM | POA: Diagnosis not present

## 2019-09-12 DIAGNOSIS — J449 Chronic obstructive pulmonary disease, unspecified: Secondary | ICD-10-CM | POA: Diagnosis not present

## 2019-09-12 DIAGNOSIS — R279 Unspecified lack of coordination: Secondary | ICD-10-CM | POA: Diagnosis not present

## 2019-09-12 DIAGNOSIS — J45909 Unspecified asthma, uncomplicated: Secondary | ICD-10-CM | POA: Diagnosis not present

## 2019-09-13 DIAGNOSIS — G309 Alzheimer's disease, unspecified: Secondary | ICD-10-CM | POA: Diagnosis not present

## 2019-09-13 DIAGNOSIS — J9610 Chronic respiratory failure, unspecified whether with hypoxia or hypercapnia: Secondary | ICD-10-CM | POA: Diagnosis not present

## 2019-09-13 DIAGNOSIS — R32 Unspecified urinary incontinence: Secondary | ICD-10-CM | POA: Diagnosis not present

## 2019-09-13 DIAGNOSIS — J449 Chronic obstructive pulmonary disease, unspecified: Secondary | ICD-10-CM | POA: Diagnosis not present

## 2019-09-13 DIAGNOSIS — J45909 Unspecified asthma, uncomplicated: Secondary | ICD-10-CM | POA: Diagnosis not present

## 2019-09-13 DIAGNOSIS — R279 Unspecified lack of coordination: Secondary | ICD-10-CM | POA: Diagnosis not present

## 2019-09-13 DIAGNOSIS — I130 Hypertensive heart and chronic kidney disease with heart failure and stage 1 through stage 4 chronic kidney disease, or unspecified chronic kidney disease: Secondary | ICD-10-CM | POA: Diagnosis not present

## 2019-09-14 DIAGNOSIS — R279 Unspecified lack of coordination: Secondary | ICD-10-CM | POA: Diagnosis not present

## 2019-09-14 DIAGNOSIS — G309 Alzheimer's disease, unspecified: Secondary | ICD-10-CM | POA: Diagnosis not present

## 2019-09-14 DIAGNOSIS — J45909 Unspecified asthma, uncomplicated: Secondary | ICD-10-CM | POA: Diagnosis not present

## 2019-09-14 DIAGNOSIS — R32 Unspecified urinary incontinence: Secondary | ICD-10-CM | POA: Diagnosis not present

## 2019-09-14 DIAGNOSIS — J449 Chronic obstructive pulmonary disease, unspecified: Secondary | ICD-10-CM | POA: Diagnosis not present

## 2019-09-14 DIAGNOSIS — J9610 Chronic respiratory failure, unspecified whether with hypoxia or hypercapnia: Secondary | ICD-10-CM | POA: Diagnosis not present

## 2019-09-17 DIAGNOSIS — D638 Anemia in other chronic diseases classified elsewhere: Secondary | ICD-10-CM | POA: Diagnosis not present

## 2019-09-17 DIAGNOSIS — N179 Acute kidney failure, unspecified: Secondary | ICD-10-CM | POA: Diagnosis not present

## 2019-09-17 DIAGNOSIS — N189 Chronic kidney disease, unspecified: Secondary | ICD-10-CM | POA: Diagnosis not present

## 2019-09-17 DIAGNOSIS — R1310 Dysphagia, unspecified: Secondary | ICD-10-CM | POA: Diagnosis not present

## 2019-09-17 DIAGNOSIS — J309 Allergic rhinitis, unspecified: Secondary | ICD-10-CM | POA: Diagnosis not present

## 2019-09-17 DIAGNOSIS — R279 Unspecified lack of coordination: Secondary | ICD-10-CM | POA: Diagnosis not present

## 2019-09-17 DIAGNOSIS — R32 Unspecified urinary incontinence: Secondary | ICD-10-CM | POA: Diagnosis not present

## 2019-09-17 DIAGNOSIS — K862 Cyst of pancreas: Secondary | ICD-10-CM | POA: Diagnosis not present

## 2019-09-17 DIAGNOSIS — F339 Major depressive disorder, recurrent, unspecified: Secondary | ICD-10-CM | POA: Diagnosis not present

## 2019-09-17 DIAGNOSIS — I739 Peripheral vascular disease, unspecified: Secondary | ICD-10-CM | POA: Diagnosis not present

## 2019-09-17 DIAGNOSIS — G309 Alzheimer's disease, unspecified: Secondary | ICD-10-CM | POA: Diagnosis not present

## 2019-09-17 DIAGNOSIS — J9610 Chronic respiratory failure, unspecified whether with hypoxia or hypercapnia: Secondary | ICD-10-CM | POA: Diagnosis not present

## 2019-09-17 DIAGNOSIS — F0280 Dementia in other diseases classified elsewhere without behavioral disturbance: Secondary | ICD-10-CM | POA: Diagnosis not present

## 2019-09-17 DIAGNOSIS — M6281 Muscle weakness (generalized): Secondary | ICD-10-CM | POA: Diagnosis not present

## 2019-09-17 DIAGNOSIS — Z9181 History of falling: Secondary | ICD-10-CM | POA: Diagnosis not present

## 2019-09-17 DIAGNOSIS — J449 Chronic obstructive pulmonary disease, unspecified: Secondary | ICD-10-CM | POA: Diagnosis not present

## 2019-09-17 DIAGNOSIS — G8929 Other chronic pain: Secondary | ICD-10-CM | POA: Diagnosis not present

## 2019-09-17 DIAGNOSIS — J45909 Unspecified asthma, uncomplicated: Secondary | ICD-10-CM | POA: Diagnosis not present

## 2019-09-24 DIAGNOSIS — F339 Major depressive disorder, recurrent, unspecified: Secondary | ICD-10-CM | POA: Diagnosis not present

## 2019-09-24 DIAGNOSIS — F0391 Unspecified dementia with behavioral disturbance: Secondary | ICD-10-CM | POA: Diagnosis not present

## 2019-09-27 ENCOUNTER — Telehealth: Payer: Self-pay | Admitting: Family Medicine

## 2019-09-27 NOTE — Telephone Encounter (Signed)
LVM re: Possibly reschedule new patient appt cancelled from 09/12/19.

## 2019-10-22 DIAGNOSIS — G4701 Insomnia due to medical condition: Secondary | ICD-10-CM | POA: Diagnosis not present

## 2019-10-22 DIAGNOSIS — F330 Major depressive disorder, recurrent, mild: Secondary | ICD-10-CM | POA: Diagnosis not present

## 2019-10-22 DIAGNOSIS — G301 Alzheimer's disease with late onset: Secondary | ICD-10-CM | POA: Diagnosis not present

## 2019-10-22 DIAGNOSIS — F0391 Unspecified dementia with behavioral disturbance: Secondary | ICD-10-CM | POA: Diagnosis not present

## 2019-10-22 DIAGNOSIS — F339 Major depressive disorder, recurrent, unspecified: Secondary | ICD-10-CM | POA: Diagnosis not present

## 2019-10-26 DIAGNOSIS — G8929 Other chronic pain: Secondary | ICD-10-CM | POA: Diagnosis not present

## 2019-11-05 ENCOUNTER — Ambulatory Visit (INDEPENDENT_AMBULATORY_CARE_PROVIDER_SITE_OTHER): Payer: Medicare Other | Admitting: Nurse Practitioner

## 2019-11-05 ENCOUNTER — Encounter: Payer: Self-pay | Admitting: Nurse Practitioner

## 2019-11-05 ENCOUNTER — Other Ambulatory Visit: Payer: Self-pay

## 2019-11-05 VITALS — BP 104/60 | HR 78 | Temp 97.7°F | Ht 64.0 in | Wt 154.8 lb

## 2019-11-05 DIAGNOSIS — J439 Emphysema, unspecified: Secondary | ICD-10-CM | POA: Diagnosis not present

## 2019-11-05 DIAGNOSIS — I10 Essential (primary) hypertension: Secondary | ICD-10-CM

## 2019-11-05 DIAGNOSIS — G309 Alzheimer's disease, unspecified: Secondary | ICD-10-CM

## 2019-11-05 DIAGNOSIS — H9193 Unspecified hearing loss, bilateral: Secondary | ICD-10-CM | POA: Insufficient documentation

## 2019-11-05 DIAGNOSIS — R1312 Dysphagia, oropharyngeal phase: Secondary | ICD-10-CM | POA: Diagnosis not present

## 2019-11-05 DIAGNOSIS — N189 Chronic kidney disease, unspecified: Secondary | ICD-10-CM

## 2019-11-05 DIAGNOSIS — F0281 Dementia in other diseases classified elsewhere with behavioral disturbance: Secondary | ICD-10-CM

## 2019-11-05 DIAGNOSIS — Z9181 History of falling: Secondary | ICD-10-CM | POA: Insufficient documentation

## 2019-11-05 DIAGNOSIS — I739 Peripheral vascular disease, unspecified: Secondary | ICD-10-CM | POA: Insufficient documentation

## 2019-11-05 NOTE — Progress Notes (Signed)
Subjective:  Patient ID: Laura [name], female    DOB: Jan 13, 1924  Age: 86 y.o. MRN: [id number]  CC: Establish Care (New pt-follow up-nursing home-she has had covid shot, tetanus, and [place])  HPI Accompanied by Laura [name], states she is Laura [name]'s HCPOA/proxy for last 40yrs. She provides all the answers Laura [name] is at home with her at this time. She was discharged from [facility] and [place] today 11/05/2019. Medications prescriptions were sent to local pharmacy in [place], [place]. Laura [name] states " I don't want her to die in a nursing home, so I brought her home with me".  She was admitted to [facility] 1year ago after fall with fracture. She lived with Laura [name] prior to Rehab admission. She states Laura [name] has no immediate family or children. She is incontinent of bowel and bladder. She is wheelchair bound and unable to stand without assistance. She is able to feed herself with supervision. With hx of dysphagia, her food is mechanical soft and thin liquids. Laura [name] denies any cough or throat clearing with food or liquid intake.  Reviewed past Medical, Social and Family history today.  Outpatient Medications Prior to Visit  Medication Sig Dispense Refill  . calcium-vitamin D (OSCAL WITH D) 500-200 MG-UNIT tablet Take 1 tablet by mouth.    . cholecalciferol (VITAMIN D) 1000 UNITS tablet Take 1,000 Units by mouth daily with breakfast. Reported on 09/30/2015    . donepezil (ARICEPT) 10 MG tablet TAKE 1 TABLET (10 MG TOTAL) BY MOUTH AT BEDTIME. 30 tablet 1  . escitalopram (LEXAPRO) 5 MG tablet Take 5 mg by mouth daily.    Marland Kitchen HYDROcodone-acetaminophen (NORCO/VICODIN) 5-325 MG tablet Take 1 tablet by mouth every 6 (six) hours as needed for moderate pain.    . Melatonin 2.5 MG CAPS Take by mouth.    . memantine (NAMENDA) 10 MG tablet Take 10 mg by mouth 2 (two) times daily.    . polyethylene glycol (MIRALAX / GLYCOLAX) 17 g packet Take 17  g by mouth daily.    Marland Kitchen acetaminophen (MAPAP) 500 MG tablet Take 500 mg by mouth every 6 (six) hours as needed for mild pain, fever or headache.     Marland Kitchen alum & mag hydroxide-simeth (MI-ACID MAXIMUM STRENGTH) 400-400-40 MG/5ML suspension Take 30 mLs by mouth every 6 (six) hours as needed for indigestion.     Laura [name] Gel Base (HYDROGEL) GEL Apply 1 application topically daily as needed (for rash). Apply to face    . Cranberry 250 MG CAPS Take 1 capsule by mouth daily.    . Lactobacillus (PROBIOTIC ACIDOPHILUS PO) Take by mouth.    . lactose free nutrition (BOOST PLUS) LIQD Take 237 mLs by mouth 2 (two) times daily between meals. (Patient not taking: Reported on 11/05/2019)  0  . loperamide (IMODIUM) 2 MG capsule Take 2 mg by mouth as needed for diarrhea or loose stools. NOT TO EXCEED 8 DOSES IN 24 HR    . magnesium hydroxide (MILK OF MAGNESIA) 400 MG/5ML suspension Take 30 mLs by mouth daily as needed for mild constipation.     Marland Kitchen oseltamivir (TAMIFLU) 75 MG capsule Take 75 mg by mouth daily.    Marland Kitchen PARoxetine (PAXIL) 20 MG tablet Take 20 mg by mouth daily.    . Protein (PROCEL PO) Take 2 scoop by mouth 2 (two) times daily. Reported on 09/30/2015    . risperiDONE (RISPERDAL) 0.25 MG tablet Take 0.25 mg by mouth 2 (  two) times daily.     No facility-administered medications prior to visit.    ROS See HPI  Objective:  BP 104/60   Pulse 78   Temp 97.7 F (36.5 C) (Tympanic)   Ht 5\' 4"  (1.626 m)   Wt 154 lb 12.8 oz (70.2 kg)   SpO2 90%   BMI 26.57 kg/m   BP Readings from Last 3 Encounters:  11/05/19 104/60  09/20/16 108/68  09/30/15 (!) 141/74    Wt Readings from Last 3 Encounters:  11/05/19 154 lb 12.8 oz (70.2 kg)  09/30/15 158 lb (71.7 kg)  04/28/15 156 lb 9.6 oz (71 kg)   Physical Exam Cardiovascular:     Rate and Rhythm: Normal rate and regular rhythm.     Pulses: Normal pulses.     Heart sounds: Normal heart sounds.  Pulmonary:     Effort: Pulmonary effort is normal.      Breath sounds: Normal breath sounds.  Musculoskeletal:     Right lower leg: No edema.     Left lower leg: No edema.  Neurological:     Mental Status: She is alert.     Comments: Oriented to person only     Lab Results  Component Value Date   WBC 16.6 (H) 01/19/2018   HGB 14.4 01/19/2018   HCT 44.7 01/19/2018   PLT 214 01/19/2018   GLUCOSE 88 09/30/2015   ALT 15 09/30/2015   AST 23 09/30/2015   NA 143 09/30/2015   K 4.7 09/30/2015   CL 102 09/30/2015   CREATININE 1.36 (H) 09/30/2015   BUN 17 09/30/2015   CO2 28 09/30/2015   TSH 0.59 09/30/2015   INR 1.02 11/27/2014   HGBA1C 5.4 10/23/2014    Assessment & Plan:  This visit occurred during the SARS-CoV-2 public health emergency.  Safety protocols were in place, including screening questions prior to the visit, additional usage of staff PPE, and extensive cleaning of exam room while observing appropriate contact time as indicated for disinfecting solutions.   Laura [name] was seen today for establish care.  Diagnoses and all orders for this visit:  Essential hypertension  Pulmonary emphysema, unspecified emphysema type ([place])  Alzheimer's dementia with behavioral disturbance, unspecified timing of dementia onset ([place])  Oropharyngeal dysphagia  Chronic kidney disease, unspecified CKD stage   I have discontinued Laura [name]'s loperamide, acetaminophen, alum & mag hydroxide-simeth, magnesium hydroxide, Hydrogel, lactose free nutrition, Protein (PROCEL PO), PARoxetine, Cranberry, risperiDONE, Lactobacillus (PROBIOTIC ACIDOPHILUS PO), and oseltamivir. I am also having her maintain her cholecalciferol, donepezil, memantine, escitalopram, polyethylene glycol, calcium-vitamin D, Melatonin, and HYDROcodone-acetaminophen.  No orders of the defined types were placed in this encounter.   Problem List Items Addressed This Visit      Cardiovascular and Mediastinum   Hypertension - Primary     Respiratory   COPD with  emphysema ([place])     Digestive   Dysphagia     Nervous and Auditory   Alzheimer's dementia ([place])   Relevant Medications   escitalopram (LEXAPRO) 5 MG tablet     Genitourinary   Chronic kidney disease       Follow-up: Return if symptoms worsen or fail to improve.  Wilfred Lacy, NP

## 2019-11-05 NOTE — Patient Instructions (Signed)
Sign medical release to get records from [facility] and [place].

## 2019-11-08 ENCOUNTER — Telehealth: Payer: Self-pay | Admitting: Nurse Practitioner

## 2019-11-08 MED ORDER — DONEPEZIL HCL 10 MG PO TABS: ORAL_TABLET | ORAL | 0 refills | Status: AC

## 2019-11-08 NOTE — Telephone Encounter (Signed)
Pt wants to get a refill on donepezil (ARICEPT) 10 MG tablet, pt is out. [facility] [address], [place], [place] [address]

## 2019-11-08 NOTE — Telephone Encounter (Signed)
Rx sent to Pharmacy as pt requested #90. LVM for the pt to call back.

## 2019-11-08 NOTE — Telephone Encounter (Signed)
Ok to send aricept, #90 Records for last 72yrs: progress notes, lab results and medication list

## 2019-11-08 NOTE — Telephone Encounter (Signed)
Pt caregiver returned call, Ms. Solomans  Advised that [place] not available.  Advised her that #90 days of medication was sent in and to schedule f/u for pt within 3 months. Advised that we will request 2 years of records.

## 2019-11-08 NOTE — Telephone Encounter (Signed)
[place] please advise, we have not prescribe this rx before. Last ov with you were 11/05/2019.   FYI--they just drop off record release form request today--how far back do you want to request?

## 2019-11-09 ENCOUNTER — Telehealth: Payer: Self-pay | Admitting: Nurse Practitioner

## 2019-11-09 DIAGNOSIS — F0281 Dementia in other diseases classified elsewhere with behavioral disturbance: Secondary | ICD-10-CM

## 2019-11-09 DIAGNOSIS — I6340 Cerebral infarction due to embolism of unspecified cerebral artery: Secondary | ICD-10-CM

## 2019-11-09 DIAGNOSIS — R531 Weakness: Secondary | ICD-10-CM

## 2019-11-09 NOTE — Telephone Encounter (Signed)
Please advise 

## 2019-11-09 NOTE — Telephone Encounter (Signed)
Patients POA is calling and wanted to see if [facility] could write a prescription for a hospital bed. CB is [phone]

## 2019-11-12 NOTE — Telephone Encounter (Signed)
She will need an evaluation by home PT and OT prior to ordering this. Order entered

## 2019-11-13 NOTE — Telephone Encounter (Signed)
Laura [name] is aware of message below.

## 2019-11-14 ENCOUNTER — Telehealth: Payer: Self-pay | Admitting: Nurse Practitioner

## 2019-11-14 MED ORDER — ESCITALOPRAM OXALATE 5 MG PO TABS: 5.0000 mg | ORAL_TABLET | Freq: Every day | ORAL | 1 refills | Status: AC

## 2019-11-14 NOTE — Telephone Encounter (Signed)
Rx sent, pt's daughter was notified to pick up prescription.

## 2019-11-14 NOTE — Telephone Encounter (Signed)
Charlotte please advise, we have not send in this rx yet.

## 2019-11-14 NOTE — Telephone Encounter (Signed)
Patient daughter is calling and requesting a refill for [facility]ram [facility] [place]. CB is [phone]

## 2019-11-14 NOTE — Telephone Encounter (Signed)
Ok to send, #90 with 1 refill

## 2019-11-19 DIAGNOSIS — N10 Acute tubulo-interstitial nephritis: Secondary | ICD-10-CM | POA: Diagnosis not present

## 2019-11-19 DIAGNOSIS — R4182 Altered mental status, unspecified: Secondary | ICD-10-CM | POA: Diagnosis not present

## 2019-11-19 DIAGNOSIS — R0902 Hypoxemia: Secondary | ICD-10-CM | POA: Diagnosis not present

## 2019-11-19 DIAGNOSIS — R531 Weakness: Secondary | ICD-10-CM | POA: Diagnosis not present

## 2019-11-19 DIAGNOSIS — Z66 Do not resuscitate: Secondary | ICD-10-CM | POA: Diagnosis not present

## 2019-11-19 DIAGNOSIS — K7201 Acute and subacute hepatic failure with coma: Secondary | ICD-10-CM | POA: Diagnosis not present

## 2019-11-19 DIAGNOSIS — J45909 Unspecified asthma, uncomplicated: Secondary | ICD-10-CM | POA: Diagnosis not present

## 2019-11-19 DIAGNOSIS — N281 Cyst of kidney, acquired: Secondary | ICD-10-CM | POA: Diagnosis not present

## 2019-11-19 DIAGNOSIS — N839 Noninflammatory disorder of ovary, fallopian tube and broad ligament, unspecified: Secondary | ICD-10-CM | POA: Diagnosis not present

## 2019-11-19 DIAGNOSIS — R000 Tachycardia, unspecified: Secondary | ICD-10-CM | POA: Diagnosis not present

## 2019-11-19 DIAGNOSIS — N3946 Mixed incontinence: Secondary | ICD-10-CM | POA: Diagnosis not present

## 2019-11-19 DIAGNOSIS — I509 Heart failure, unspecified: Secondary | ICD-10-CM | POA: Diagnosis not present

## 2019-11-19 DIAGNOSIS — J449 Chronic obstructive pulmonary disease, unspecified: Secondary | ICD-10-CM | POA: Diagnosis not present

## 2019-11-19 DIAGNOSIS — R0602 Shortness of breath: Secondary | ICD-10-CM | POA: Diagnosis not present

## 2019-11-19 DIAGNOSIS — R5381 Other malaise: Secondary | ICD-10-CM | POA: Diagnosis not present

## 2019-11-19 DIAGNOSIS — K5730 Diverticulosis of large intestine without perforation or abscess without bleeding: Secondary | ICD-10-CM | POA: Diagnosis not present

## 2019-11-19 DIAGNOSIS — Z7401 Bed confinement status: Secondary | ICD-10-CM | POA: Diagnosis not present

## 2019-11-19 DIAGNOSIS — Z515 Encounter for palliative care: Secondary | ICD-10-CM | POA: Diagnosis not present

## 2019-11-19 DIAGNOSIS — R6521 Severe sepsis with septic shock: Secondary | ICD-10-CM | POA: Diagnosis not present

## 2019-11-19 DIAGNOSIS — R404 Transient alteration of awareness: Secondary | ICD-10-CM | POA: Diagnosis not present

## 2019-11-19 DIAGNOSIS — Z20822 Contact with and (suspected) exposure to covid-19: Secondary | ICD-10-CM | POA: Diagnosis not present

## 2019-11-19 DIAGNOSIS — R918 Other nonspecific abnormal finding of lung field: Secondary | ICD-10-CM | POA: Diagnosis not present

## 2019-11-19 DIAGNOSIS — G309 Alzheimer's disease, unspecified: Secondary | ICD-10-CM | POA: Diagnosis not present

## 2019-11-19 DIAGNOSIS — N179 Acute kidney failure, unspecified: Secondary | ICD-10-CM | POA: Diagnosis not present

## 2019-11-19 DIAGNOSIS — I5021 Acute systolic (congestive) heart failure: Secondary | ICD-10-CM | POA: Diagnosis not present

## 2019-11-19 DIAGNOSIS — A419 Sepsis, unspecified organism: Secondary | ICD-10-CM | POA: Diagnosis not present

## 2019-11-20 ENCOUNTER — Telehealth: Payer: Self-pay | Admitting: Nurse Practitioner

## 2019-11-20 NOTE — Telephone Encounter (Signed)
Patient POA called and wanted to let Baldo Ash know that she had to take patient to the hospital and she will be in ICU. CB is [phone]

## 2019-11-21 ENCOUNTER — Telehealth: Payer: Self-pay | Admitting: Nurse Practitioner

## 2019-11-21 NOTE — Telephone Encounter (Signed)
Laura [name] is calling and wanted to see if she can get a verbal order for home health on Saturday. CB is [phone]

## 2019-11-21 NOTE — Telephone Encounter (Signed)
Charlotte please advise.  [place] for home health?

## 2019-11-22 DIAGNOSIS — R531 Weakness: Secondary | ICD-10-CM | POA: Diagnosis not present

## 2019-11-22 DIAGNOSIS — G309 Alzheimer's disease, unspecified: Secondary | ICD-10-CM | POA: Diagnosis not present

## 2019-11-22 DIAGNOSIS — J449 Chronic obstructive pulmonary disease, unspecified: Secondary | ICD-10-CM | POA: Diagnosis not present

## 2019-11-22 DIAGNOSIS — I509 Heart failure, unspecified: Secondary | ICD-10-CM | POA: Diagnosis not present

## 2019-11-22 DIAGNOSIS — J45909 Unspecified asthma, uncomplicated: Secondary | ICD-10-CM | POA: Diagnosis not present

## 2019-11-22 DIAGNOSIS — N3946 Mixed incontinence: Secondary | ICD-10-CM | POA: Diagnosis not present

## 2019-11-22 NOTE — Telephone Encounter (Signed)
ok 

## 2019-11-22 NOTE — Telephone Encounter (Signed)
Laura [name] was contacted and given the [facility] for home health orders.

## 2019-11-23 ENCOUNTER — Encounter: Payer: Self-pay | Admitting: Nurse Practitioner

## 2019-11-23 DIAGNOSIS — J449 Chronic obstructive pulmonary disease, unspecified: Secondary | ICD-10-CM | POA: Diagnosis not present

## 2019-11-23 DIAGNOSIS — R531 Weakness: Secondary | ICD-10-CM | POA: Diagnosis not present

## 2019-11-23 DIAGNOSIS — N3946 Mixed incontinence: Secondary | ICD-10-CM | POA: Diagnosis not present

## 2019-11-23 DIAGNOSIS — G309 Alzheimer's disease, unspecified: Secondary | ICD-10-CM | POA: Diagnosis not present

## 2019-11-23 DIAGNOSIS — J45909 Unspecified asthma, uncomplicated: Secondary | ICD-10-CM | POA: Diagnosis not present

## 2019-11-23 DIAGNOSIS — I509 Heart failure, unspecified: Secondary | ICD-10-CM | POA: Diagnosis not present

## 2019-11-26 DIAGNOSIS — I509 Heart failure, unspecified: Secondary | ICD-10-CM | POA: Diagnosis not present

## 2019-11-26 DIAGNOSIS — J449 Chronic obstructive pulmonary disease, unspecified: Secondary | ICD-10-CM | POA: Diagnosis not present

## 2019-11-26 DIAGNOSIS — J45909 Unspecified asthma, uncomplicated: Secondary | ICD-10-CM | POA: Diagnosis not present

## 2019-11-26 DIAGNOSIS — R531 Weakness: Secondary | ICD-10-CM | POA: Diagnosis not present

## 2019-11-26 DIAGNOSIS — N3946 Mixed incontinence: Secondary | ICD-10-CM | POA: Diagnosis not present

## 2019-11-26 DIAGNOSIS — G309 Alzheimer's disease, unspecified: Secondary | ICD-10-CM | POA: Diagnosis not present

## 2019-11-28 DIAGNOSIS — R531 Weakness: Secondary | ICD-10-CM | POA: Diagnosis not present

## 2019-11-28 DIAGNOSIS — N3946 Mixed incontinence: Secondary | ICD-10-CM | POA: Diagnosis not present

## 2019-11-28 DIAGNOSIS — J449 Chronic obstructive pulmonary disease, unspecified: Secondary | ICD-10-CM | POA: Diagnosis not present

## 2019-11-28 DIAGNOSIS — G309 Alzheimer's disease, unspecified: Secondary | ICD-10-CM | POA: Diagnosis not present

## 2019-11-28 DIAGNOSIS — I509 Heart failure, unspecified: Secondary | ICD-10-CM | POA: Diagnosis not present

## 2019-11-28 DIAGNOSIS — J45909 Unspecified asthma, uncomplicated: Secondary | ICD-10-CM | POA: Diagnosis not present

## 2019-11-29 DIAGNOSIS — R531 Weakness: Secondary | ICD-10-CM | POA: Diagnosis not present

## 2019-11-29 DIAGNOSIS — I509 Heart failure, unspecified: Secondary | ICD-10-CM | POA: Diagnosis not present

## 2019-11-29 DIAGNOSIS — G309 Alzheimer's disease, unspecified: Secondary | ICD-10-CM | POA: Diagnosis not present

## 2019-11-29 DIAGNOSIS — N3946 Mixed incontinence: Secondary | ICD-10-CM | POA: Diagnosis not present

## 2019-11-29 DIAGNOSIS — J45909 Unspecified asthma, uncomplicated: Secondary | ICD-10-CM | POA: Diagnosis not present

## 2019-11-29 DIAGNOSIS — J449 Chronic obstructive pulmonary disease, unspecified: Secondary | ICD-10-CM | POA: Diagnosis not present

## 2019-11-30 DIAGNOSIS — N3946 Mixed incontinence: Secondary | ICD-10-CM | POA: Diagnosis not present

## 2019-11-30 DIAGNOSIS — I509 Heart failure, unspecified: Secondary | ICD-10-CM | POA: Diagnosis not present

## 2019-11-30 DIAGNOSIS — G309 Alzheimer's disease, unspecified: Secondary | ICD-10-CM | POA: Diagnosis not present

## 2019-11-30 DIAGNOSIS — J45909 Unspecified asthma, uncomplicated: Secondary | ICD-10-CM | POA: Diagnosis not present

## 2019-11-30 DIAGNOSIS — J449 Chronic obstructive pulmonary disease, unspecified: Secondary | ICD-10-CM | POA: Diagnosis not present

## 2019-11-30 DIAGNOSIS — R531 Weakness: Secondary | ICD-10-CM | POA: Diagnosis not present

## 2019-12-03 DIAGNOSIS — R531 Weakness: Secondary | ICD-10-CM | POA: Diagnosis not present

## 2019-12-03 DIAGNOSIS — N3946 Mixed incontinence: Secondary | ICD-10-CM | POA: Diagnosis not present

## 2019-12-03 DIAGNOSIS — J45909 Unspecified asthma, uncomplicated: Secondary | ICD-10-CM | POA: Diagnosis not present

## 2019-12-03 DIAGNOSIS — J449 Chronic obstructive pulmonary disease, unspecified: Secondary | ICD-10-CM | POA: Diagnosis not present

## 2019-12-03 DIAGNOSIS — I509 Heart failure, unspecified: Secondary | ICD-10-CM | POA: Diagnosis not present

## 2019-12-03 DIAGNOSIS — G309 Alzheimer's disease, unspecified: Secondary | ICD-10-CM | POA: Diagnosis not present

## 2019-12-04 DIAGNOSIS — J45909 Unspecified asthma, uncomplicated: Secondary | ICD-10-CM | POA: Diagnosis not present

## 2019-12-04 DIAGNOSIS — J449 Chronic obstructive pulmonary disease, unspecified: Secondary | ICD-10-CM | POA: Diagnosis not present

## 2019-12-04 DIAGNOSIS — G309 Alzheimer's disease, unspecified: Secondary | ICD-10-CM | POA: Diagnosis not present

## 2019-12-04 DIAGNOSIS — I509 Heart failure, unspecified: Secondary | ICD-10-CM | POA: Diagnosis not present

## 2019-12-04 DIAGNOSIS — R531 Weakness: Secondary | ICD-10-CM | POA: Diagnosis not present

## 2019-12-04 DIAGNOSIS — N3946 Mixed incontinence: Secondary | ICD-10-CM | POA: Diagnosis not present

## 2019-12-05 DIAGNOSIS — J449 Chronic obstructive pulmonary disease, unspecified: Secondary | ICD-10-CM | POA: Diagnosis not present

## 2019-12-05 DIAGNOSIS — G309 Alzheimer's disease, unspecified: Secondary | ICD-10-CM | POA: Diagnosis not present

## 2019-12-05 DIAGNOSIS — I509 Heart failure, unspecified: Secondary | ICD-10-CM | POA: Diagnosis not present

## 2019-12-05 DIAGNOSIS — J45909 Unspecified asthma, uncomplicated: Secondary | ICD-10-CM | POA: Diagnosis not present

## 2019-12-05 DIAGNOSIS — R531 Weakness: Secondary | ICD-10-CM | POA: Diagnosis not present

## 2019-12-05 DIAGNOSIS — N3946 Mixed incontinence: Secondary | ICD-10-CM | POA: Diagnosis not present

## 2019-12-07 DIAGNOSIS — I509 Heart failure, unspecified: Secondary | ICD-10-CM | POA: Diagnosis not present

## 2019-12-07 DIAGNOSIS — J449 Chronic obstructive pulmonary disease, unspecified: Secondary | ICD-10-CM | POA: Diagnosis not present

## 2019-12-07 DIAGNOSIS — R531 Weakness: Secondary | ICD-10-CM | POA: Diagnosis not present

## 2019-12-07 DIAGNOSIS — G309 Alzheimer's disease, unspecified: Secondary | ICD-10-CM | POA: Diagnosis not present

## 2019-12-07 DIAGNOSIS — J45909 Unspecified asthma, uncomplicated: Secondary | ICD-10-CM | POA: Diagnosis not present

## 2019-12-07 DIAGNOSIS — N3946 Mixed incontinence: Secondary | ICD-10-CM | POA: Diagnosis not present

## 2019-12-11 DIAGNOSIS — G309 Alzheimer's disease, unspecified: Secondary | ICD-10-CM | POA: Diagnosis not present

## 2019-12-11 DIAGNOSIS — R531 Weakness: Secondary | ICD-10-CM | POA: Diagnosis not present

## 2019-12-11 DIAGNOSIS — J45909 Unspecified asthma, uncomplicated: Secondary | ICD-10-CM | POA: Diagnosis not present

## 2019-12-11 DIAGNOSIS — J449 Chronic obstructive pulmonary disease, unspecified: Secondary | ICD-10-CM | POA: Diagnosis not present

## 2019-12-11 DIAGNOSIS — N3946 Mixed incontinence: Secondary | ICD-10-CM | POA: Diagnosis not present

## 2019-12-11 DIAGNOSIS — I509 Heart failure, unspecified: Secondary | ICD-10-CM | POA: Diagnosis not present

## 2019-12-14 DIAGNOSIS — N3946 Mixed incontinence: Secondary | ICD-10-CM | POA: Diagnosis not present

## 2019-12-14 DIAGNOSIS — G309 Alzheimer's disease, unspecified: Secondary | ICD-10-CM | POA: Diagnosis not present

## 2019-12-14 DIAGNOSIS — R531 Weakness: Secondary | ICD-10-CM | POA: Diagnosis not present

## 2019-12-14 DIAGNOSIS — J449 Chronic obstructive pulmonary disease, unspecified: Secondary | ICD-10-CM | POA: Diagnosis not present

## 2019-12-14 DIAGNOSIS — J45909 Unspecified asthma, uncomplicated: Secondary | ICD-10-CM | POA: Diagnosis not present

## 2019-12-14 DIAGNOSIS — I509 Heart failure, unspecified: Secondary | ICD-10-CM | POA: Diagnosis not present

## 2019-12-17 DIAGNOSIS — J45909 Unspecified asthma, uncomplicated: Secondary | ICD-10-CM | POA: Diagnosis not present

## 2019-12-17 DIAGNOSIS — J449 Chronic obstructive pulmonary disease, unspecified: Secondary | ICD-10-CM | POA: Diagnosis not present

## 2019-12-17 DIAGNOSIS — G309 Alzheimer's disease, unspecified: Secondary | ICD-10-CM | POA: Diagnosis not present

## 2019-12-17 DIAGNOSIS — N3946 Mixed incontinence: Secondary | ICD-10-CM | POA: Diagnosis not present

## 2019-12-17 DIAGNOSIS — I509 Heart failure, unspecified: Secondary | ICD-10-CM | POA: Diagnosis not present

## 2019-12-17 DIAGNOSIS — R531 Weakness: Secondary | ICD-10-CM | POA: Diagnosis not present

## 2019-12-18 DIAGNOSIS — I509 Heart failure, unspecified: Secondary | ICD-10-CM | POA: Diagnosis not present

## 2019-12-18 DIAGNOSIS — R531 Weakness: Secondary | ICD-10-CM | POA: Diagnosis not present

## 2019-12-18 DIAGNOSIS — J449 Chronic obstructive pulmonary disease, unspecified: Secondary | ICD-10-CM | POA: Diagnosis not present

## 2019-12-18 DIAGNOSIS — N3946 Mixed incontinence: Secondary | ICD-10-CM | POA: Diagnosis not present

## 2019-12-18 DIAGNOSIS — J45909 Unspecified asthma, uncomplicated: Secondary | ICD-10-CM | POA: Diagnosis not present

## 2019-12-18 DIAGNOSIS — G309 Alzheimer's disease, unspecified: Secondary | ICD-10-CM | POA: Diagnosis not present

## 2019-12-19 DIAGNOSIS — I10 Essential (primary) hypertension: Secondary | ICD-10-CM | POA: Diagnosis not present

## 2019-12-19 DIAGNOSIS — N3946 Mixed incontinence: Secondary | ICD-10-CM | POA: Diagnosis not present

## 2019-12-19 DIAGNOSIS — I509 Heart failure, unspecified: Secondary | ICD-10-CM | POA: Diagnosis not present

## 2019-12-19 DIAGNOSIS — G309 Alzheimer's disease, unspecified: Secondary | ICD-10-CM | POA: Diagnosis not present

## 2019-12-19 DIAGNOSIS — Z87891 Personal history of nicotine dependence: Secondary | ICD-10-CM | POA: Diagnosis not present

## 2019-12-19 DIAGNOSIS — J449 Chronic obstructive pulmonary disease, unspecified: Secondary | ICD-10-CM | POA: Diagnosis not present

## 2019-12-19 DIAGNOSIS — N390 Urinary tract infection, site not specified: Secondary | ICD-10-CM | POA: Diagnosis not present

## 2019-12-19 DIAGNOSIS — J45909 Unspecified asthma, uncomplicated: Secondary | ICD-10-CM | POA: Diagnosis not present

## 2019-12-19 DIAGNOSIS — R531 Weakness: Secondary | ICD-10-CM | POA: Diagnosis not present

## 2019-12-20 DIAGNOSIS — J45909 Unspecified asthma, uncomplicated: Secondary | ICD-10-CM | POA: Diagnosis not present

## 2019-12-20 DIAGNOSIS — I509 Heart failure, unspecified: Secondary | ICD-10-CM | POA: Diagnosis not present

## 2019-12-20 DIAGNOSIS — N3946 Mixed incontinence: Secondary | ICD-10-CM | POA: Diagnosis not present

## 2019-12-20 DIAGNOSIS — Z7401 Bed confinement status: Secondary | ICD-10-CM | POA: Diagnosis not present

## 2019-12-20 DIAGNOSIS — G309 Alzheimer's disease, unspecified: Secondary | ICD-10-CM | POA: Diagnosis not present

## 2019-12-20 DIAGNOSIS — J449 Chronic obstructive pulmonary disease, unspecified: Secondary | ICD-10-CM | POA: Diagnosis not present

## 2019-12-20 DIAGNOSIS — R404 Transient alteration of awareness: Secondary | ICD-10-CM | POA: Diagnosis not present

## 2019-12-20 DIAGNOSIS — R0902 Hypoxemia: Secondary | ICD-10-CM | POA: Diagnosis not present

## 2019-12-20 DIAGNOSIS — R531 Weakness: Secondary | ICD-10-CM | POA: Diagnosis not present

## 2019-12-21 DIAGNOSIS — J45909 Unspecified asthma, uncomplicated: Secondary | ICD-10-CM | POA: Diagnosis not present

## 2019-12-21 DIAGNOSIS — J449 Chronic obstructive pulmonary disease, unspecified: Secondary | ICD-10-CM | POA: Diagnosis not present

## 2019-12-21 DIAGNOSIS — G309 Alzheimer's disease, unspecified: Secondary | ICD-10-CM | POA: Diagnosis not present

## 2019-12-21 DIAGNOSIS — N3946 Mixed incontinence: Secondary | ICD-10-CM | POA: Diagnosis not present

## 2019-12-21 DIAGNOSIS — I509 Heart failure, unspecified: Secondary | ICD-10-CM | POA: Diagnosis not present

## 2019-12-21 DIAGNOSIS — R531 Weakness: Secondary | ICD-10-CM | POA: Diagnosis not present

## 2019-12-22 DIAGNOSIS — G309 Alzheimer's disease, unspecified: Secondary | ICD-10-CM | POA: Diagnosis not present

## 2019-12-22 DIAGNOSIS — J45909 Unspecified asthma, uncomplicated: Secondary | ICD-10-CM | POA: Diagnosis not present

## 2019-12-22 DIAGNOSIS — J449 Chronic obstructive pulmonary disease, unspecified: Secondary | ICD-10-CM | POA: Diagnosis not present

## 2019-12-22 DIAGNOSIS — N3946 Mixed incontinence: Secondary | ICD-10-CM | POA: Diagnosis not present

## 2019-12-22 DIAGNOSIS — I509 Heart failure, unspecified: Secondary | ICD-10-CM | POA: Diagnosis not present

## 2019-12-22 DIAGNOSIS — R531 Weakness: Secondary | ICD-10-CM | POA: Diagnosis not present

## 2019-12-23 DIAGNOSIS — J449 Chronic obstructive pulmonary disease, unspecified: Secondary | ICD-10-CM | POA: Diagnosis not present

## 2019-12-23 DIAGNOSIS — J45909 Unspecified asthma, uncomplicated: Secondary | ICD-10-CM | POA: Diagnosis not present

## 2019-12-23 DIAGNOSIS — R531 Weakness: Secondary | ICD-10-CM | POA: Diagnosis not present

## 2019-12-23 DIAGNOSIS — N3946 Mixed incontinence: Secondary | ICD-10-CM | POA: Diagnosis not present

## 2019-12-23 DIAGNOSIS — I509 Heart failure, unspecified: Secondary | ICD-10-CM | POA: Diagnosis not present

## 2019-12-23 DIAGNOSIS — G309 Alzheimer's disease, unspecified: Secondary | ICD-10-CM | POA: Diagnosis not present

## 2019-12-24 DIAGNOSIS — J449 Chronic obstructive pulmonary disease, unspecified: Secondary | ICD-10-CM | POA: Diagnosis not present

## 2019-12-24 DIAGNOSIS — I509 Heart failure, unspecified: Secondary | ICD-10-CM | POA: Diagnosis not present

## 2019-12-24 DIAGNOSIS — J45909 Unspecified asthma, uncomplicated: Secondary | ICD-10-CM | POA: Diagnosis not present

## 2019-12-24 DIAGNOSIS — R531 Weakness: Secondary | ICD-10-CM | POA: Diagnosis not present

## 2019-12-24 DIAGNOSIS — G309 Alzheimer's disease, unspecified: Secondary | ICD-10-CM | POA: Diagnosis not present

## 2019-12-24 DIAGNOSIS — N3946 Mixed incontinence: Secondary | ICD-10-CM | POA: Diagnosis not present

## 2019-12-25 DIAGNOSIS — J45909 Unspecified asthma, uncomplicated: Secondary | ICD-10-CM | POA: Diagnosis not present

## 2019-12-25 DIAGNOSIS — G309 Alzheimer's disease, unspecified: Secondary | ICD-10-CM | POA: Diagnosis not present

## 2019-12-25 DIAGNOSIS — I509 Heart failure, unspecified: Secondary | ICD-10-CM | POA: Diagnosis not present

## 2019-12-25 DIAGNOSIS — R531 Weakness: Secondary | ICD-10-CM | POA: Diagnosis not present

## 2019-12-25 DIAGNOSIS — N3946 Mixed incontinence: Secondary | ICD-10-CM | POA: Diagnosis not present

## 2019-12-25 DIAGNOSIS — J449 Chronic obstructive pulmonary disease, unspecified: Secondary | ICD-10-CM | POA: Diagnosis not present

## 2019-12-26 DIAGNOSIS — R531 Weakness: Secondary | ICD-10-CM | POA: Diagnosis not present

## 2019-12-26 DIAGNOSIS — J45909 Unspecified asthma, uncomplicated: Secondary | ICD-10-CM | POA: Diagnosis not present

## 2019-12-26 DIAGNOSIS — G309 Alzheimer's disease, unspecified: Secondary | ICD-10-CM | POA: Diagnosis not present

## 2019-12-26 DIAGNOSIS — N3946 Mixed incontinence: Secondary | ICD-10-CM | POA: Diagnosis not present

## 2019-12-26 DIAGNOSIS — J449 Chronic obstructive pulmonary disease, unspecified: Secondary | ICD-10-CM | POA: Diagnosis not present

## 2019-12-26 DIAGNOSIS — I509 Heart failure, unspecified: Secondary | ICD-10-CM | POA: Diagnosis not present

## 2019-12-27 DIAGNOSIS — I509 Heart failure, unspecified: Secondary | ICD-10-CM | POA: Diagnosis not present

## 2019-12-27 DIAGNOSIS — R531 Weakness: Secondary | ICD-10-CM | POA: Diagnosis not present

## 2019-12-27 DIAGNOSIS — G309 Alzheimer's disease, unspecified: Secondary | ICD-10-CM | POA: Diagnosis not present

## 2019-12-27 DIAGNOSIS — J45909 Unspecified asthma, uncomplicated: Secondary | ICD-10-CM | POA: Diagnosis not present

## 2019-12-27 DIAGNOSIS — J449 Chronic obstructive pulmonary disease, unspecified: Secondary | ICD-10-CM | POA: Diagnosis not present

## 2019-12-27 DIAGNOSIS — N3946 Mixed incontinence: Secondary | ICD-10-CM | POA: Diagnosis not present

## 2019-12-28 DIAGNOSIS — N3946 Mixed incontinence: Secondary | ICD-10-CM | POA: Diagnosis not present

## 2019-12-28 DIAGNOSIS — R531 Weakness: Secondary | ICD-10-CM | POA: Diagnosis not present

## 2019-12-28 DIAGNOSIS — J449 Chronic obstructive pulmonary disease, unspecified: Secondary | ICD-10-CM | POA: Diagnosis not present

## 2019-12-28 DIAGNOSIS — G309 Alzheimer's disease, unspecified: Secondary | ICD-10-CM | POA: Diagnosis not present

## 2019-12-28 DIAGNOSIS — J45909 Unspecified asthma, uncomplicated: Secondary | ICD-10-CM | POA: Diagnosis not present

## 2019-12-28 DIAGNOSIS — I509 Heart failure, unspecified: Secondary | ICD-10-CM | POA: Diagnosis not present

## 2019-12-29 DIAGNOSIS — J449 Chronic obstructive pulmonary disease, unspecified: Secondary | ICD-10-CM | POA: Diagnosis not present

## 2019-12-29 DIAGNOSIS — R531 Weakness: Secondary | ICD-10-CM | POA: Diagnosis not present

## 2019-12-29 DIAGNOSIS — I509 Heart failure, unspecified: Secondary | ICD-10-CM | POA: Diagnosis not present

## 2019-12-29 DIAGNOSIS — N3946 Mixed incontinence: Secondary | ICD-10-CM | POA: Diagnosis not present

## 2019-12-29 DIAGNOSIS — J45909 Unspecified asthma, uncomplicated: Secondary | ICD-10-CM | POA: Diagnosis not present

## 2019-12-29 DIAGNOSIS — G309 Alzheimer's disease, unspecified: Secondary | ICD-10-CM | POA: Diagnosis not present

## 2019-12-30 DIAGNOSIS — G309 Alzheimer's disease, unspecified: Secondary | ICD-10-CM | POA: Diagnosis not present

## 2019-12-30 DIAGNOSIS — R531 Weakness: Secondary | ICD-10-CM | POA: Diagnosis not present

## 2019-12-30 DIAGNOSIS — J45909 Unspecified asthma, uncomplicated: Secondary | ICD-10-CM | POA: Diagnosis not present

## 2019-12-30 DIAGNOSIS — I509 Heart failure, unspecified: Secondary | ICD-10-CM | POA: Diagnosis not present

## 2019-12-30 DIAGNOSIS — N3946 Mixed incontinence: Secondary | ICD-10-CM | POA: Diagnosis not present

## 2019-12-30 DIAGNOSIS — J449 Chronic obstructive pulmonary disease, unspecified: Secondary | ICD-10-CM | POA: Diagnosis not present

## 2019-12-31 DIAGNOSIS — I509 Heart failure, unspecified: Secondary | ICD-10-CM | POA: Diagnosis not present

## 2019-12-31 DIAGNOSIS — J45909 Unspecified asthma, uncomplicated: Secondary | ICD-10-CM | POA: Diagnosis not present

## 2019-12-31 DIAGNOSIS — R531 Weakness: Secondary | ICD-10-CM | POA: Diagnosis not present

## 2019-12-31 DIAGNOSIS — G309 Alzheimer's disease, unspecified: Secondary | ICD-10-CM | POA: Diagnosis not present

## 2019-12-31 DIAGNOSIS — N3946 Mixed incontinence: Secondary | ICD-10-CM | POA: Diagnosis not present

## 2019-12-31 DIAGNOSIS — J449 Chronic obstructive pulmonary disease, unspecified: Secondary | ICD-10-CM | POA: Diagnosis not present

## 2020-01-01 DIAGNOSIS — G309 Alzheimer's disease, unspecified: Secondary | ICD-10-CM | POA: Diagnosis not present

## 2020-01-01 DIAGNOSIS — N3946 Mixed incontinence: Secondary | ICD-10-CM | POA: Diagnosis not present

## 2020-01-01 DIAGNOSIS — J45909 Unspecified asthma, uncomplicated: Secondary | ICD-10-CM | POA: Diagnosis not present

## 2020-01-01 DIAGNOSIS — R531 Weakness: Secondary | ICD-10-CM | POA: Diagnosis not present

## 2020-01-01 DIAGNOSIS — J449 Chronic obstructive pulmonary disease, unspecified: Secondary | ICD-10-CM | POA: Diagnosis not present

## 2020-01-01 DIAGNOSIS — I509 Heart failure, unspecified: Secondary | ICD-10-CM | POA: Diagnosis not present

## 2020-01-02 DIAGNOSIS — N3946 Mixed incontinence: Secondary | ICD-10-CM | POA: Diagnosis not present

## 2020-01-02 DIAGNOSIS — I509 Heart failure, unspecified: Secondary | ICD-10-CM | POA: Diagnosis not present

## 2020-01-02 DIAGNOSIS — R531 Weakness: Secondary | ICD-10-CM | POA: Diagnosis not present

## 2020-01-02 DIAGNOSIS — G309 Alzheimer's disease, unspecified: Secondary | ICD-10-CM | POA: Diagnosis not present

## 2020-01-02 DIAGNOSIS — J449 Chronic obstructive pulmonary disease, unspecified: Secondary | ICD-10-CM | POA: Diagnosis not present

## 2020-01-02 DIAGNOSIS — J45909 Unspecified asthma, uncomplicated: Secondary | ICD-10-CM | POA: Diagnosis not present

## 2020-01-03 DIAGNOSIS — G309 Alzheimer's disease, unspecified: Secondary | ICD-10-CM | POA: Diagnosis not present

## 2020-01-03 DIAGNOSIS — I509 Heart failure, unspecified: Secondary | ICD-10-CM | POA: Diagnosis not present

## 2020-01-03 DIAGNOSIS — J45909 Unspecified asthma, uncomplicated: Secondary | ICD-10-CM | POA: Diagnosis not present

## 2020-01-03 DIAGNOSIS — N3946 Mixed incontinence: Secondary | ICD-10-CM | POA: Diagnosis not present

## 2020-01-03 DIAGNOSIS — R531 Weakness: Secondary | ICD-10-CM | POA: Diagnosis not present

## 2020-01-03 DIAGNOSIS — J449 Chronic obstructive pulmonary disease, unspecified: Secondary | ICD-10-CM | POA: Diagnosis not present

## 2020-01-04 DIAGNOSIS — J449 Chronic obstructive pulmonary disease, unspecified: Secondary | ICD-10-CM | POA: Diagnosis not present

## 2020-01-04 DIAGNOSIS — I509 Heart failure, unspecified: Secondary | ICD-10-CM | POA: Diagnosis not present

## 2020-01-04 DIAGNOSIS — N3946 Mixed incontinence: Secondary | ICD-10-CM | POA: Diagnosis not present

## 2020-01-04 DIAGNOSIS — J45909 Unspecified asthma, uncomplicated: Secondary | ICD-10-CM | POA: Diagnosis not present

## 2020-01-04 DIAGNOSIS — G309 Alzheimer's disease, unspecified: Secondary | ICD-10-CM | POA: Diagnosis not present

## 2020-01-04 DIAGNOSIS — R531 Weakness: Secondary | ICD-10-CM | POA: Diagnosis not present

## 2020-01-05 DIAGNOSIS — J449 Chronic obstructive pulmonary disease, unspecified: Secondary | ICD-10-CM | POA: Diagnosis not present

## 2020-01-05 DIAGNOSIS — R531 Weakness: Secondary | ICD-10-CM | POA: Diagnosis not present

## 2020-01-05 DIAGNOSIS — I509 Heart failure, unspecified: Secondary | ICD-10-CM | POA: Diagnosis not present

## 2020-01-05 DIAGNOSIS — N3946 Mixed incontinence: Secondary | ICD-10-CM | POA: Diagnosis not present

## 2020-01-05 DIAGNOSIS — J45909 Unspecified asthma, uncomplicated: Secondary | ICD-10-CM | POA: Diagnosis not present

## 2020-01-05 DIAGNOSIS — G309 Alzheimer's disease, unspecified: Secondary | ICD-10-CM | POA: Diagnosis not present

## 2020-01-06 DIAGNOSIS — J45909 Unspecified asthma, uncomplicated: Secondary | ICD-10-CM | POA: Diagnosis not present

## 2020-01-06 DIAGNOSIS — R531 Weakness: Secondary | ICD-10-CM | POA: Diagnosis not present

## 2020-01-06 DIAGNOSIS — N3946 Mixed incontinence: Secondary | ICD-10-CM | POA: Diagnosis not present

## 2020-01-06 DIAGNOSIS — J449 Chronic obstructive pulmonary disease, unspecified: Secondary | ICD-10-CM | POA: Diagnosis not present

## 2020-01-06 DIAGNOSIS — G309 Alzheimer's disease, unspecified: Secondary | ICD-10-CM | POA: Diagnosis not present

## 2020-01-06 DIAGNOSIS — I509 Heart failure, unspecified: Secondary | ICD-10-CM | POA: Diagnosis not present

## 2020-01-07 DIAGNOSIS — J449 Chronic obstructive pulmonary disease, unspecified: Secondary | ICD-10-CM | POA: Diagnosis not present

## 2020-01-07 DIAGNOSIS — R531 Weakness: Secondary | ICD-10-CM | POA: Diagnosis not present

## 2020-01-07 DIAGNOSIS — G309 Alzheimer's disease, unspecified: Secondary | ICD-10-CM | POA: Diagnosis not present

## 2020-01-07 DIAGNOSIS — J45909 Unspecified asthma, uncomplicated: Secondary | ICD-10-CM | POA: Diagnosis not present

## 2020-01-07 DIAGNOSIS — N3946 Mixed incontinence: Secondary | ICD-10-CM | POA: Diagnosis not present

## 2020-01-07 DIAGNOSIS — I509 Heart failure, unspecified: Secondary | ICD-10-CM | POA: Diagnosis not present

## 2020-01-08 DIAGNOSIS — R404 Transient alteration of awareness: Secondary | ICD-10-CM | POA: Diagnosis not present

## 2020-01-08 DIAGNOSIS — N3946 Mixed incontinence: Secondary | ICD-10-CM | POA: Diagnosis not present

## 2020-01-08 DIAGNOSIS — R531 Weakness: Secondary | ICD-10-CM | POA: Diagnosis not present

## 2020-01-08 DIAGNOSIS — R29898 Other symptoms and signs involving the musculoskeletal system: Secondary | ICD-10-CM | POA: Diagnosis not present

## 2020-01-08 DIAGNOSIS — J449 Chronic obstructive pulmonary disease, unspecified: Secondary | ICD-10-CM | POA: Diagnosis not present

## 2020-01-08 DIAGNOSIS — J45909 Unspecified asthma, uncomplicated: Secondary | ICD-10-CM | POA: Diagnosis not present

## 2020-01-08 DIAGNOSIS — Z7401 Bed confinement status: Secondary | ICD-10-CM | POA: Diagnosis not present

## 2020-01-08 DIAGNOSIS — I509 Heart failure, unspecified: Secondary | ICD-10-CM | POA: Diagnosis not present

## 2020-01-08 DIAGNOSIS — G309 Alzheimer's disease, unspecified: Secondary | ICD-10-CM | POA: Diagnosis not present

## 2020-01-09 DIAGNOSIS — J449 Chronic obstructive pulmonary disease, unspecified: Secondary | ICD-10-CM | POA: Diagnosis not present

## 2020-01-09 DIAGNOSIS — G308 Other Alzheimer's disease: Secondary | ICD-10-CM | POA: Diagnosis not present

## 2020-01-09 DIAGNOSIS — G309 Alzheimer's disease, unspecified: Secondary | ICD-10-CM | POA: Diagnosis not present

## 2020-01-09 DIAGNOSIS — R41841 Cognitive communication deficit: Secondary | ICD-10-CM | POA: Diagnosis not present

## 2020-01-09 DIAGNOSIS — I509 Heart failure, unspecified: Secondary | ICD-10-CM | POA: Diagnosis not present

## 2020-01-09 DIAGNOSIS — M6281 Muscle weakness (generalized): Secondary | ICD-10-CM | POA: Diagnosis not present

## 2020-01-09 DIAGNOSIS — R1312 Dysphagia, oropharyngeal phase: Secondary | ICD-10-CM | POA: Diagnosis not present

## 2020-01-10 DIAGNOSIS — G309 Alzheimer's disease, unspecified: Secondary | ICD-10-CM | POA: Diagnosis not present

## 2020-01-10 DIAGNOSIS — R531 Weakness: Secondary | ICD-10-CM | POA: Diagnosis not present

## 2020-01-10 DIAGNOSIS — I509 Heart failure, unspecified: Secondary | ICD-10-CM | POA: Diagnosis not present

## 2020-01-10 DIAGNOSIS — J449 Chronic obstructive pulmonary disease, unspecified: Secondary | ICD-10-CM | POA: Diagnosis not present

## 2020-01-11 DIAGNOSIS — I509 Heart failure, unspecified: Secondary | ICD-10-CM | POA: Diagnosis not present

## 2020-01-11 DIAGNOSIS — R41841 Cognitive communication deficit: Secondary | ICD-10-CM | POA: Diagnosis not present

## 2020-01-11 DIAGNOSIS — G309 Alzheimer's disease, unspecified: Secondary | ICD-10-CM | POA: Diagnosis not present

## 2020-01-11 DIAGNOSIS — J449 Chronic obstructive pulmonary disease, unspecified: Secondary | ICD-10-CM | POA: Diagnosis not present

## 2020-01-11 DIAGNOSIS — G308 Other Alzheimer's disease: Secondary | ICD-10-CM | POA: Diagnosis not present

## 2020-01-11 DIAGNOSIS — R1312 Dysphagia, oropharyngeal phase: Secondary | ICD-10-CM | POA: Diagnosis not present

## 2020-01-11 DIAGNOSIS — M6281 Muscle weakness (generalized): Secondary | ICD-10-CM | POA: Diagnosis not present

## 2020-01-12 DIAGNOSIS — G309 Alzheimer's disease, unspecified: Secondary | ICD-10-CM | POA: Diagnosis not present

## 2020-01-12 DIAGNOSIS — M6281 Muscle weakness (generalized): Secondary | ICD-10-CM | POA: Diagnosis not present

## 2020-01-12 DIAGNOSIS — J449 Chronic obstructive pulmonary disease, unspecified: Secondary | ICD-10-CM | POA: Diagnosis not present

## 2020-01-12 DIAGNOSIS — G308 Other Alzheimer's disease: Secondary | ICD-10-CM | POA: Diagnosis not present

## 2020-01-12 DIAGNOSIS — I509 Heart failure, unspecified: Secondary | ICD-10-CM | POA: Diagnosis not present

## 2020-01-12 DIAGNOSIS — R1312 Dysphagia, oropharyngeal phase: Secondary | ICD-10-CM | POA: Diagnosis not present

## 2020-01-12 DIAGNOSIS — R41841 Cognitive communication deficit: Secondary | ICD-10-CM | POA: Diagnosis not present

## 2020-01-13 DIAGNOSIS — I509 Heart failure, unspecified: Secondary | ICD-10-CM | POA: Diagnosis not present

## 2020-01-13 DIAGNOSIS — G309 Alzheimer's disease, unspecified: Secondary | ICD-10-CM | POA: Diagnosis not present

## 2020-01-13 DIAGNOSIS — M6281 Muscle weakness (generalized): Secondary | ICD-10-CM | POA: Diagnosis not present

## 2020-01-13 DIAGNOSIS — R1312 Dysphagia, oropharyngeal phase: Secondary | ICD-10-CM | POA: Diagnosis not present

## 2020-01-13 DIAGNOSIS — R41841 Cognitive communication deficit: Secondary | ICD-10-CM | POA: Diagnosis not present

## 2020-01-13 DIAGNOSIS — G308 Other Alzheimer's disease: Secondary | ICD-10-CM | POA: Diagnosis not present

## 2020-01-13 DIAGNOSIS — J449 Chronic obstructive pulmonary disease, unspecified: Secondary | ICD-10-CM | POA: Diagnosis not present

## 2020-01-14 DIAGNOSIS — R41841 Cognitive communication deficit: Secondary | ICD-10-CM | POA: Diagnosis not present

## 2020-01-14 DIAGNOSIS — G309 Alzheimer's disease, unspecified: Secondary | ICD-10-CM | POA: Diagnosis not present

## 2020-01-14 DIAGNOSIS — R1312 Dysphagia, oropharyngeal phase: Secondary | ICD-10-CM | POA: Diagnosis not present

## 2020-01-14 DIAGNOSIS — I509 Heart failure, unspecified: Secondary | ICD-10-CM | POA: Diagnosis not present

## 2020-01-14 DIAGNOSIS — M6281 Muscle weakness (generalized): Secondary | ICD-10-CM | POA: Diagnosis not present

## 2020-01-14 DIAGNOSIS — G308 Other Alzheimer's disease: Secondary | ICD-10-CM | POA: Diagnosis not present

## 2020-01-14 DIAGNOSIS — J449 Chronic obstructive pulmonary disease, unspecified: Secondary | ICD-10-CM | POA: Diagnosis not present

## 2020-01-15 DIAGNOSIS — M6281 Muscle weakness (generalized): Secondary | ICD-10-CM | POA: Diagnosis not present

## 2020-01-15 DIAGNOSIS — G308 Other Alzheimer's disease: Secondary | ICD-10-CM | POA: Diagnosis not present

## 2020-01-15 DIAGNOSIS — J449 Chronic obstructive pulmonary disease, unspecified: Secondary | ICD-10-CM | POA: Diagnosis not present

## 2020-01-15 DIAGNOSIS — R41841 Cognitive communication deficit: Secondary | ICD-10-CM | POA: Diagnosis not present

## 2020-01-15 DIAGNOSIS — G309 Alzheimer's disease, unspecified: Secondary | ICD-10-CM | POA: Diagnosis not present

## 2020-01-15 DIAGNOSIS — I509 Heart failure, unspecified: Secondary | ICD-10-CM | POA: Diagnosis not present

## 2020-01-15 DIAGNOSIS — R1312 Dysphagia, oropharyngeal phase: Secondary | ICD-10-CM | POA: Diagnosis not present

## 2020-01-16 DIAGNOSIS — R41841 Cognitive communication deficit: Secondary | ICD-10-CM | POA: Diagnosis not present

## 2020-01-16 DIAGNOSIS — G309 Alzheimer's disease, unspecified: Secondary | ICD-10-CM | POA: Diagnosis not present

## 2020-01-16 DIAGNOSIS — I509 Heart failure, unspecified: Secondary | ICD-10-CM | POA: Diagnosis not present

## 2020-01-16 DIAGNOSIS — G308 Other Alzheimer's disease: Secondary | ICD-10-CM | POA: Diagnosis not present

## 2020-01-16 DIAGNOSIS — J449 Chronic obstructive pulmonary disease, unspecified: Secondary | ICD-10-CM | POA: Diagnosis not present

## 2020-01-16 DIAGNOSIS — M6281 Muscle weakness (generalized): Secondary | ICD-10-CM | POA: Diagnosis not present

## 2020-01-16 DIAGNOSIS — R1312 Dysphagia, oropharyngeal phase: Secondary | ICD-10-CM | POA: Diagnosis not present

## 2020-01-17 DIAGNOSIS — R531 Weakness: Secondary | ICD-10-CM | POA: Diagnosis not present

## 2020-01-17 DIAGNOSIS — R262 Difficulty in walking, not elsewhere classified: Secondary | ICD-10-CM | POA: Diagnosis not present

## 2020-01-17 DIAGNOSIS — R109 Unspecified abdominal pain: Secondary | ICD-10-CM | POA: Diagnosis not present

## 2020-01-17 DIAGNOSIS — I509 Heart failure, unspecified: Secondary | ICD-10-CM | POA: Diagnosis not present

## 2020-01-17 DIAGNOSIS — M6281 Muscle weakness (generalized): Secondary | ICD-10-CM | POA: Diagnosis not present

## 2020-01-17 DIAGNOSIS — F0280 Dementia in other diseases classified elsewhere without behavioral disturbance: Secondary | ICD-10-CM | POA: Diagnosis not present

## 2020-01-17 DIAGNOSIS — R1312 Dysphagia, oropharyngeal phase: Secondary | ICD-10-CM | POA: Diagnosis not present

## 2020-01-17 DIAGNOSIS — G308 Other Alzheimer's disease: Secondary | ICD-10-CM | POA: Diagnosis not present

## 2020-01-17 DIAGNOSIS — G309 Alzheimer's disease, unspecified: Secondary | ICD-10-CM | POA: Diagnosis not present

## 2020-01-17 DIAGNOSIS — J449 Chronic obstructive pulmonary disease, unspecified: Secondary | ICD-10-CM | POA: Diagnosis not present

## 2020-01-17 DIAGNOSIS — R41841 Cognitive communication deficit: Secondary | ICD-10-CM | POA: Diagnosis not present

## 2020-01-18 DIAGNOSIS — J449 Chronic obstructive pulmonary disease, unspecified: Secondary | ICD-10-CM | POA: Diagnosis not present

## 2020-01-18 DIAGNOSIS — R109 Unspecified abdominal pain: Secondary | ICD-10-CM | POA: Diagnosis not present

## 2020-01-18 DIAGNOSIS — G309 Alzheimer's disease, unspecified: Secondary | ICD-10-CM | POA: Diagnosis not present

## 2020-01-18 DIAGNOSIS — R262 Difficulty in walking, not elsewhere classified: Secondary | ICD-10-CM | POA: Diagnosis not present

## 2020-01-18 DIAGNOSIS — G308 Other Alzheimer's disease: Secondary | ICD-10-CM | POA: Diagnosis not present

## 2020-01-18 DIAGNOSIS — M6281 Muscle weakness (generalized): Secondary | ICD-10-CM | POA: Diagnosis not present

## 2020-01-18 DIAGNOSIS — R1312 Dysphagia, oropharyngeal phase: Secondary | ICD-10-CM | POA: Diagnosis not present

## 2020-01-18 DIAGNOSIS — I509 Heart failure, unspecified: Secondary | ICD-10-CM | POA: Diagnosis not present

## 2020-01-18 DIAGNOSIS — R41841 Cognitive communication deficit: Secondary | ICD-10-CM | POA: Diagnosis not present

## 2020-01-19 DIAGNOSIS — R262 Difficulty in walking, not elsewhere classified: Secondary | ICD-10-CM | POA: Diagnosis not present

## 2020-01-19 DIAGNOSIS — M6281 Muscle weakness (generalized): Secondary | ICD-10-CM | POA: Diagnosis not present

## 2020-01-19 DIAGNOSIS — R109 Unspecified abdominal pain: Secondary | ICD-10-CM | POA: Diagnosis not present

## 2020-01-19 DIAGNOSIS — G308 Other Alzheimer's disease: Secondary | ICD-10-CM | POA: Diagnosis not present

## 2020-01-19 DIAGNOSIS — G309 Alzheimer's disease, unspecified: Secondary | ICD-10-CM | POA: Diagnosis not present

## 2020-01-19 DIAGNOSIS — R1312 Dysphagia, oropharyngeal phase: Secondary | ICD-10-CM | POA: Diagnosis not present

## 2020-01-19 DIAGNOSIS — I509 Heart failure, unspecified: Secondary | ICD-10-CM | POA: Diagnosis not present

## 2020-01-19 DIAGNOSIS — J449 Chronic obstructive pulmonary disease, unspecified: Secondary | ICD-10-CM | POA: Diagnosis not present

## 2020-01-19 DIAGNOSIS — R41841 Cognitive communication deficit: Secondary | ICD-10-CM | POA: Diagnosis not present

## 2020-01-21 DIAGNOSIS — R109 Unspecified abdominal pain: Secondary | ICD-10-CM | POA: Diagnosis not present

## 2020-01-21 DIAGNOSIS — R262 Difficulty in walking, not elsewhere classified: Secondary | ICD-10-CM | POA: Diagnosis not present

## 2020-01-21 DIAGNOSIS — M6281 Muscle weakness (generalized): Secondary | ICD-10-CM | POA: Diagnosis not present

## 2020-01-21 DIAGNOSIS — R41841 Cognitive communication deficit: Secondary | ICD-10-CM | POA: Diagnosis not present

## 2020-01-21 DIAGNOSIS — I509 Heart failure, unspecified: Secondary | ICD-10-CM | POA: Diagnosis not present

## 2020-01-21 DIAGNOSIS — J449 Chronic obstructive pulmonary disease, unspecified: Secondary | ICD-10-CM | POA: Diagnosis not present

## 2020-01-21 DIAGNOSIS — G308 Other Alzheimer's disease: Secondary | ICD-10-CM | POA: Diagnosis not present

## 2020-01-21 DIAGNOSIS — G309 Alzheimer's disease, unspecified: Secondary | ICD-10-CM | POA: Diagnosis not present

## 2020-01-21 DIAGNOSIS — R1312 Dysphagia, oropharyngeal phase: Secondary | ICD-10-CM | POA: Diagnosis not present

## 2020-01-22 DIAGNOSIS — R109 Unspecified abdominal pain: Secondary | ICD-10-CM | POA: Diagnosis not present

## 2020-01-22 DIAGNOSIS — R41841 Cognitive communication deficit: Secondary | ICD-10-CM | POA: Diagnosis not present

## 2020-01-22 DIAGNOSIS — I509 Heart failure, unspecified: Secondary | ICD-10-CM | POA: Diagnosis not present

## 2020-01-22 DIAGNOSIS — G308 Other Alzheimer's disease: Secondary | ICD-10-CM | POA: Diagnosis not present

## 2020-01-22 DIAGNOSIS — R262 Difficulty in walking, not elsewhere classified: Secondary | ICD-10-CM | POA: Diagnosis not present

## 2020-01-22 DIAGNOSIS — J449 Chronic obstructive pulmonary disease, unspecified: Secondary | ICD-10-CM | POA: Diagnosis not present

## 2020-01-22 DIAGNOSIS — M6281 Muscle weakness (generalized): Secondary | ICD-10-CM | POA: Diagnosis not present

## 2020-01-22 DIAGNOSIS — R1312 Dysphagia, oropharyngeal phase: Secondary | ICD-10-CM | POA: Diagnosis not present

## 2020-01-22 DIAGNOSIS — G309 Alzheimer's disease, unspecified: Secondary | ICD-10-CM | POA: Diagnosis not present

## 2020-01-23 DIAGNOSIS — M6281 Muscle weakness (generalized): Secondary | ICD-10-CM | POA: Diagnosis not present

## 2020-01-23 DIAGNOSIS — R109 Unspecified abdominal pain: Secondary | ICD-10-CM | POA: Diagnosis not present

## 2020-01-23 DIAGNOSIS — J449 Chronic obstructive pulmonary disease, unspecified: Secondary | ICD-10-CM | POA: Diagnosis not present

## 2020-01-23 DIAGNOSIS — I509 Heart failure, unspecified: Secondary | ICD-10-CM | POA: Diagnosis not present

## 2020-01-23 DIAGNOSIS — R41841 Cognitive communication deficit: Secondary | ICD-10-CM | POA: Diagnosis not present

## 2020-01-23 DIAGNOSIS — R1312 Dysphagia, oropharyngeal phase: Secondary | ICD-10-CM | POA: Diagnosis not present

## 2020-01-23 DIAGNOSIS — R262 Difficulty in walking, not elsewhere classified: Secondary | ICD-10-CM | POA: Diagnosis not present

## 2020-01-23 DIAGNOSIS — G309 Alzheimer's disease, unspecified: Secondary | ICD-10-CM | POA: Diagnosis not present

## 2020-01-23 DIAGNOSIS — G308 Other Alzheimer's disease: Secondary | ICD-10-CM | POA: Diagnosis not present

## 2020-01-24 DIAGNOSIS — R41841 Cognitive communication deficit: Secondary | ICD-10-CM | POA: Diagnosis not present

## 2020-01-24 DIAGNOSIS — R262 Difficulty in walking, not elsewhere classified: Secondary | ICD-10-CM | POA: Diagnosis not present

## 2020-01-24 DIAGNOSIS — G309 Alzheimer's disease, unspecified: Secondary | ICD-10-CM | POA: Diagnosis not present

## 2020-01-24 DIAGNOSIS — R1312 Dysphagia, oropharyngeal phase: Secondary | ICD-10-CM | POA: Diagnosis not present

## 2020-01-24 DIAGNOSIS — M6281 Muscle weakness (generalized): Secondary | ICD-10-CM | POA: Diagnosis not present

## 2020-01-24 DIAGNOSIS — G308 Other Alzheimer's disease: Secondary | ICD-10-CM | POA: Diagnosis not present

## 2020-01-24 DIAGNOSIS — J449 Chronic obstructive pulmonary disease, unspecified: Secondary | ICD-10-CM | POA: Diagnosis not present

## 2020-01-24 DIAGNOSIS — I509 Heart failure, unspecified: Secondary | ICD-10-CM | POA: Diagnosis not present

## 2020-01-24 DIAGNOSIS — R109 Unspecified abdominal pain: Secondary | ICD-10-CM | POA: Diagnosis not present

## 2020-01-25 DIAGNOSIS — M6281 Muscle weakness (generalized): Secondary | ICD-10-CM | POA: Diagnosis not present

## 2020-01-25 DIAGNOSIS — G309 Alzheimer's disease, unspecified: Secondary | ICD-10-CM | POA: Diagnosis not present

## 2020-01-25 DIAGNOSIS — I509 Heart failure, unspecified: Secondary | ICD-10-CM | POA: Diagnosis not present

## 2020-01-25 DIAGNOSIS — R41841 Cognitive communication deficit: Secondary | ICD-10-CM | POA: Diagnosis not present

## 2020-01-25 DIAGNOSIS — R262 Difficulty in walking, not elsewhere classified: Secondary | ICD-10-CM | POA: Diagnosis not present

## 2020-01-25 DIAGNOSIS — R109 Unspecified abdominal pain: Secondary | ICD-10-CM | POA: Diagnosis not present

## 2020-01-25 DIAGNOSIS — G308 Other Alzheimer's disease: Secondary | ICD-10-CM | POA: Diagnosis not present

## 2020-01-25 DIAGNOSIS — J449 Chronic obstructive pulmonary disease, unspecified: Secondary | ICD-10-CM | POA: Diagnosis not present

## 2020-01-25 DIAGNOSIS — R1312 Dysphagia, oropharyngeal phase: Secondary | ICD-10-CM | POA: Diagnosis not present

## 2020-01-26 DIAGNOSIS — M6281 Muscle weakness (generalized): Secondary | ICD-10-CM | POA: Diagnosis not present

## 2020-01-26 DIAGNOSIS — J449 Chronic obstructive pulmonary disease, unspecified: Secondary | ICD-10-CM | POA: Diagnosis not present

## 2020-01-26 DIAGNOSIS — R262 Difficulty in walking, not elsewhere classified: Secondary | ICD-10-CM | POA: Diagnosis not present

## 2020-01-26 DIAGNOSIS — G308 Other Alzheimer's disease: Secondary | ICD-10-CM | POA: Diagnosis not present

## 2020-01-26 DIAGNOSIS — R1312 Dysphagia, oropharyngeal phase: Secondary | ICD-10-CM | POA: Diagnosis not present

## 2020-01-26 DIAGNOSIS — I509 Heart failure, unspecified: Secondary | ICD-10-CM | POA: Diagnosis not present

## 2020-01-26 DIAGNOSIS — G309 Alzheimer's disease, unspecified: Secondary | ICD-10-CM | POA: Diagnosis not present

## 2020-01-26 DIAGNOSIS — R41841 Cognitive communication deficit: Secondary | ICD-10-CM | POA: Diagnosis not present

## 2020-01-26 DIAGNOSIS — R109 Unspecified abdominal pain: Secondary | ICD-10-CM | POA: Diagnosis not present

## 2020-01-28 DIAGNOSIS — J449 Chronic obstructive pulmonary disease, unspecified: Secondary | ICD-10-CM | POA: Diagnosis not present

## 2020-01-28 DIAGNOSIS — R109 Unspecified abdominal pain: Secondary | ICD-10-CM | POA: Diagnosis not present

## 2020-01-28 DIAGNOSIS — G309 Alzheimer's disease, unspecified: Secondary | ICD-10-CM | POA: Diagnosis not present

## 2020-01-28 DIAGNOSIS — I509 Heart failure, unspecified: Secondary | ICD-10-CM | POA: Diagnosis not present

## 2020-01-28 DIAGNOSIS — G308 Other Alzheimer's disease: Secondary | ICD-10-CM | POA: Diagnosis not present

## 2020-01-28 DIAGNOSIS — R262 Difficulty in walking, not elsewhere classified: Secondary | ICD-10-CM | POA: Diagnosis not present

## 2020-01-28 DIAGNOSIS — M6281 Muscle weakness (generalized): Secondary | ICD-10-CM | POA: Diagnosis not present

## 2020-01-28 DIAGNOSIS — R41841 Cognitive communication deficit: Secondary | ICD-10-CM | POA: Diagnosis not present

## 2020-01-28 DIAGNOSIS — R1312 Dysphagia, oropharyngeal phase: Secondary | ICD-10-CM | POA: Diagnosis not present

## 2020-01-29 DIAGNOSIS — R109 Unspecified abdominal pain: Secondary | ICD-10-CM | POA: Diagnosis not present

## 2020-01-29 DIAGNOSIS — G309 Alzheimer's disease, unspecified: Secondary | ICD-10-CM | POA: Diagnosis not present

## 2020-01-29 DIAGNOSIS — G308 Other Alzheimer's disease: Secondary | ICD-10-CM | POA: Diagnosis not present

## 2020-01-29 DIAGNOSIS — R262 Difficulty in walking, not elsewhere classified: Secondary | ICD-10-CM | POA: Diagnosis not present

## 2020-01-29 DIAGNOSIS — I509 Heart failure, unspecified: Secondary | ICD-10-CM | POA: Diagnosis not present

## 2020-01-29 DIAGNOSIS — R1312 Dysphagia, oropharyngeal phase: Secondary | ICD-10-CM | POA: Diagnosis not present

## 2020-01-29 DIAGNOSIS — J449 Chronic obstructive pulmonary disease, unspecified: Secondary | ICD-10-CM | POA: Diagnosis not present

## 2020-01-29 DIAGNOSIS — R41841 Cognitive communication deficit: Secondary | ICD-10-CM | POA: Diagnosis not present

## 2020-01-29 DIAGNOSIS — M6281 Muscle weakness (generalized): Secondary | ICD-10-CM | POA: Diagnosis not present

## 2020-01-30 DIAGNOSIS — R1312 Dysphagia, oropharyngeal phase: Secondary | ICD-10-CM | POA: Diagnosis not present

## 2020-01-30 DIAGNOSIS — R262 Difficulty in walking, not elsewhere classified: Secondary | ICD-10-CM | POA: Diagnosis not present

## 2020-01-30 DIAGNOSIS — G309 Alzheimer's disease, unspecified: Secondary | ICD-10-CM | POA: Diagnosis not present

## 2020-01-30 DIAGNOSIS — G308 Other Alzheimer's disease: Secondary | ICD-10-CM | POA: Diagnosis not present

## 2020-01-30 DIAGNOSIS — R109 Unspecified abdominal pain: Secondary | ICD-10-CM | POA: Diagnosis not present

## 2020-01-30 DIAGNOSIS — R41841 Cognitive communication deficit: Secondary | ICD-10-CM | POA: Diagnosis not present

## 2020-01-30 DIAGNOSIS — I509 Heart failure, unspecified: Secondary | ICD-10-CM | POA: Diagnosis not present

## 2020-01-30 DIAGNOSIS — J449 Chronic obstructive pulmonary disease, unspecified: Secondary | ICD-10-CM | POA: Diagnosis not present

## 2020-01-30 DIAGNOSIS — M6281 Muscle weakness (generalized): Secondary | ICD-10-CM | POA: Diagnosis not present

## 2020-01-31 DIAGNOSIS — G308 Other Alzheimer's disease: Secondary | ICD-10-CM | POA: Diagnosis not present

## 2020-01-31 DIAGNOSIS — I509 Heart failure, unspecified: Secondary | ICD-10-CM | POA: Diagnosis not present

## 2020-01-31 DIAGNOSIS — F0280 Dementia in other diseases classified elsewhere without behavioral disturbance: Secondary | ICD-10-CM | POA: Diagnosis not present

## 2020-01-31 DIAGNOSIS — J449 Chronic obstructive pulmonary disease, unspecified: Secondary | ICD-10-CM | POA: Diagnosis not present

## 2020-01-31 DIAGNOSIS — R41841 Cognitive communication deficit: Secondary | ICD-10-CM | POA: Diagnosis not present

## 2020-01-31 DIAGNOSIS — M6281 Muscle weakness (generalized): Secondary | ICD-10-CM | POA: Diagnosis not present

## 2020-01-31 DIAGNOSIS — R262 Difficulty in walking, not elsewhere classified: Secondary | ICD-10-CM | POA: Diagnosis not present

## 2020-01-31 DIAGNOSIS — G309 Alzheimer's disease, unspecified: Secondary | ICD-10-CM | POA: Diagnosis not present

## 2020-01-31 DIAGNOSIS — R109 Unspecified abdominal pain: Secondary | ICD-10-CM | POA: Diagnosis not present

## 2020-01-31 DIAGNOSIS — E46 Unspecified protein-calorie malnutrition: Secondary | ICD-10-CM | POA: Diagnosis not present

## 2020-01-31 DIAGNOSIS — R1312 Dysphagia, oropharyngeal phase: Secondary | ICD-10-CM | POA: Diagnosis not present

## 2020-02-01 DIAGNOSIS — M6281 Muscle weakness (generalized): Secondary | ICD-10-CM | POA: Diagnosis not present

## 2020-02-01 DIAGNOSIS — I509 Heart failure, unspecified: Secondary | ICD-10-CM | POA: Diagnosis not present

## 2020-02-01 DIAGNOSIS — R262 Difficulty in walking, not elsewhere classified: Secondary | ICD-10-CM | POA: Diagnosis not present

## 2020-02-01 DIAGNOSIS — I517 Cardiomegaly: Secondary | ICD-10-CM | POA: Diagnosis not present

## 2020-02-01 DIAGNOSIS — G308 Other Alzheimer's disease: Secondary | ICD-10-CM | POA: Diagnosis not present

## 2020-02-01 DIAGNOSIS — R1312 Dysphagia, oropharyngeal phase: Secondary | ICD-10-CM | POA: Diagnosis not present

## 2020-02-01 DIAGNOSIS — R05 Cough: Secondary | ICD-10-CM | POA: Diagnosis not present

## 2020-02-01 DIAGNOSIS — G309 Alzheimer's disease, unspecified: Secondary | ICD-10-CM | POA: Diagnosis not present

## 2020-02-01 DIAGNOSIS — J449 Chronic obstructive pulmonary disease, unspecified: Secondary | ICD-10-CM | POA: Diagnosis not present

## 2020-02-01 DIAGNOSIS — R41841 Cognitive communication deficit: Secondary | ICD-10-CM | POA: Diagnosis not present

## 2020-02-01 DIAGNOSIS — R109 Unspecified abdominal pain: Secondary | ICD-10-CM | POA: Diagnosis not present

## 2020-02-04 DIAGNOSIS — G309 Alzheimer's disease, unspecified: Secondary | ICD-10-CM | POA: Diagnosis not present

## 2020-02-04 DIAGNOSIS — R262 Difficulty in walking, not elsewhere classified: Secondary | ICD-10-CM | POA: Diagnosis not present

## 2020-02-04 DIAGNOSIS — M6281 Muscle weakness (generalized): Secondary | ICD-10-CM | POA: Diagnosis not present

## 2020-02-04 DIAGNOSIS — R109 Unspecified abdominal pain: Secondary | ICD-10-CM | POA: Diagnosis not present

## 2020-02-04 DIAGNOSIS — R41841 Cognitive communication deficit: Secondary | ICD-10-CM | POA: Diagnosis not present

## 2020-02-04 DIAGNOSIS — I509 Heart failure, unspecified: Secondary | ICD-10-CM | POA: Diagnosis not present

## 2020-02-04 DIAGNOSIS — G308 Other Alzheimer's disease: Secondary | ICD-10-CM | POA: Diagnosis not present

## 2020-02-04 DIAGNOSIS — J449 Chronic obstructive pulmonary disease, unspecified: Secondary | ICD-10-CM | POA: Diagnosis not present

## 2020-02-04 DIAGNOSIS — R1312 Dysphagia, oropharyngeal phase: Secondary | ICD-10-CM | POA: Diagnosis not present

## 2020-02-05 DIAGNOSIS — M6281 Muscle weakness (generalized): Secondary | ICD-10-CM | POA: Diagnosis not present

## 2020-02-05 DIAGNOSIS — J449 Chronic obstructive pulmonary disease, unspecified: Secondary | ICD-10-CM | POA: Diagnosis not present

## 2020-02-05 DIAGNOSIS — R109 Unspecified abdominal pain: Secondary | ICD-10-CM | POA: Diagnosis not present

## 2020-02-05 DIAGNOSIS — R1312 Dysphagia, oropharyngeal phase: Secondary | ICD-10-CM | POA: Diagnosis not present

## 2020-02-05 DIAGNOSIS — R262 Difficulty in walking, not elsewhere classified: Secondary | ICD-10-CM | POA: Diagnosis not present

## 2020-02-05 DIAGNOSIS — G309 Alzheimer's disease, unspecified: Secondary | ICD-10-CM | POA: Diagnosis not present

## 2020-02-05 DIAGNOSIS — R41841 Cognitive communication deficit: Secondary | ICD-10-CM | POA: Diagnosis not present

## 2020-02-05 DIAGNOSIS — I509 Heart failure, unspecified: Secondary | ICD-10-CM | POA: Diagnosis not present

## 2020-02-05 DIAGNOSIS — G308 Other Alzheimer's disease: Secondary | ICD-10-CM | POA: Diagnosis not present

## 2020-02-07 DIAGNOSIS — I509 Heart failure, unspecified: Secondary | ICD-10-CM | POA: Diagnosis not present

## 2020-02-07 DIAGNOSIS — E46 Unspecified protein-calorie malnutrition: Secondary | ICD-10-CM | POA: Diagnosis not present

## 2020-02-07 DIAGNOSIS — G309 Alzheimer's disease, unspecified: Secondary | ICD-10-CM | POA: Diagnosis not present

## 2020-02-07 DIAGNOSIS — F0280 Dementia in other diseases classified elsewhere without behavioral disturbance: Secondary | ICD-10-CM | POA: Diagnosis not present

## 2020-02-15 DIAGNOSIS — F0280 Dementia in other diseases classified elsewhere without behavioral disturbance: Secondary | ICD-10-CM | POA: Diagnosis not present

## 2020-02-15 DIAGNOSIS — I509 Heart failure, unspecified: Secondary | ICD-10-CM | POA: Diagnosis not present

## 2020-02-15 DIAGNOSIS — G309 Alzheimer's disease, unspecified: Secondary | ICD-10-CM | POA: Diagnosis not present

## 2020-02-15 DIAGNOSIS — E46 Unspecified protein-calorie malnutrition: Secondary | ICD-10-CM | POA: Diagnosis not present

## 2020-03-14 DIAGNOSIS — E46 Unspecified protein-calorie malnutrition: Secondary | ICD-10-CM | POA: Diagnosis not present

## 2020-03-14 DIAGNOSIS — F0280 Dementia in other diseases classified elsewhere without behavioral disturbance: Secondary | ICD-10-CM | POA: Diagnosis not present

## 2020-03-14 DIAGNOSIS — G309 Alzheimer's disease, unspecified: Secondary | ICD-10-CM | POA: Diagnosis not present

## 2020-03-14 DIAGNOSIS — R531 Weakness: Secondary | ICD-10-CM | POA: Diagnosis not present

## 2020-03-14 DIAGNOSIS — I509 Heart failure, unspecified: Secondary | ICD-10-CM | POA: Diagnosis not present

## 2020-03-19 DIAGNOSIS — Z23 Encounter for immunization: Secondary | ICD-10-CM | POA: Diagnosis not present

## 2020-03-20 DIAGNOSIS — M838 Other adult osteomalacia: Secondary | ICD-10-CM | POA: Diagnosis not present

## 2020-03-20 DIAGNOSIS — I509 Heart failure, unspecified: Secondary | ICD-10-CM | POA: Diagnosis not present

## 2020-03-20 DIAGNOSIS — R531 Weakness: Secondary | ICD-10-CM | POA: Diagnosis not present

## 2020-03-20 DIAGNOSIS — E46 Unspecified protein-calorie malnutrition: Secondary | ICD-10-CM | POA: Diagnosis not present

## 2020-03-20 DIAGNOSIS — G309 Alzheimer's disease, unspecified: Secondary | ICD-10-CM | POA: Diagnosis not present

## 2020-04-05 DIAGNOSIS — G309 Alzheimer's disease, unspecified: Secondary | ICD-10-CM | POA: Diagnosis not present

## 2020-04-05 DIAGNOSIS — R1312 Dysphagia, oropharyngeal phase: Secondary | ICD-10-CM | POA: Diagnosis not present

## 2020-04-07 DIAGNOSIS — R600 Localized edema: Secondary | ICD-10-CM | POA: Diagnosis not present

## 2020-04-07 DIAGNOSIS — L603 Nail dystrophy: Secondary | ICD-10-CM | POA: Diagnosis not present

## 2020-04-07 DIAGNOSIS — B351 Tinea unguium: Secondary | ICD-10-CM | POA: Diagnosis not present

## 2020-04-09 DIAGNOSIS — Z23 Encounter for immunization: Secondary | ICD-10-CM | POA: Diagnosis not present

## 2020-04-29 DIAGNOSIS — Z961 Presence of intraocular lens: Secondary | ICD-10-CM | POA: Diagnosis not present
